# Patient Record
Sex: Male | Born: 1950 | Race: White | Hispanic: No | Smoking: Never smoker
Health system: Southern US, Community
[De-identification: ages and names within clinical notes are randomized; demographics above are authoritative.]

## PROBLEM LIST (undated history)

## (undated) DIAGNOSIS — I639 Cerebral infarction, unspecified: Secondary | ICD-10-CM

## (undated) DIAGNOSIS — I10 Essential (primary) hypertension: Secondary | ICD-10-CM

## (undated) HISTORY — DX: Cerebral infarction, unspecified: I63.9

## (undated) HISTORY — PX: COLONOSCOPY: SHX174

---

## 2011-10-30 ENCOUNTER — Emergency Department (HOSPITAL_COMMUNITY): Payer: BC Managed Care – PPO

## 2011-10-30 ENCOUNTER — Inpatient Hospital Stay (HOSPITAL_COMMUNITY)
Admission: EM | Admit: 2011-10-30 | Discharge: 2011-11-14 | DRG: 533 | Disposition: A | Discharge status: Rehabilitation Facility | Payer: BC Managed Care – PPO | Attending: Neurology | Admitting: Neurology

## 2011-10-30 ENCOUNTER — Encounter (HOSPITAL_COMMUNITY): Payer: Self-pay | Admitting: *Deleted

## 2011-10-30 DIAGNOSIS — M6282 Rhabdomyolysis: Secondary | ICD-10-CM | POA: Diagnosis present

## 2011-10-30 DIAGNOSIS — I610 Nontraumatic intracerebral hemorrhage in hemisphere, subcortical: Secondary | ICD-10-CM

## 2011-10-30 DIAGNOSIS — I619 Nontraumatic intracerebral hemorrhage, unspecified: Principal | ICD-10-CM | POA: Diagnosis present

## 2011-10-30 DIAGNOSIS — D72829 Elevated white blood cell count, unspecified: Secondary | ICD-10-CM | POA: Diagnosis not present

## 2011-10-30 DIAGNOSIS — R5381 Other malaise: Secondary | ICD-10-CM | POA: Diagnosis present

## 2011-10-30 DIAGNOSIS — I10 Essential (primary) hypertension: Secondary | ICD-10-CM | POA: Diagnosis present

## 2011-10-30 DIAGNOSIS — G8190 Hemiplegia, unspecified affecting unspecified side: Secondary | ICD-10-CM | POA: Diagnosis present

## 2011-10-30 DIAGNOSIS — E876 Hypokalemia: Secondary | ICD-10-CM | POA: Diagnosis not present

## 2011-10-30 DIAGNOSIS — G936 Cerebral edema: Secondary | ICD-10-CM | POA: Diagnosis present

## 2011-10-30 DIAGNOSIS — I498 Other specified cardiac arrhythmias: Secondary | ICD-10-CM | POA: Diagnosis not present

## 2011-10-30 HISTORY — DX: Essential (primary) hypertension: I10

## 2011-10-30 LAB — CBC
HCT: 43.4 % (ref 39.0–52.0)
Hemoglobin: 15.8 g/dL (ref 13.0–17.0)
MCH: 31.5 pg (ref 26.0–34.0)
MCHC: 36.4 g/dL — ABNORMAL HIGH (ref 30.0–36.0)
MCV: 86.6 fL (ref 78.0–100.0)
Platelets: 200 10*3/uL (ref 150–400)
RBC: 5.01 MIL/uL (ref 4.22–5.81)
RDW: 13.2 % (ref 11.5–15.5)
WBC: 14.6 K/uL — ABNORMAL HIGH (ref 4.0–10.5)

## 2011-10-30 LAB — COMPREHENSIVE METABOLIC PANEL
AST: 46 U/L — ABNORMAL HIGH (ref 0–37)
Albumin: 4.6 g/dL (ref 3.5–5.2)
Calcium: 10.4 mg/dL (ref 8.4–10.5)
Creatinine, Ser: 0.96 mg/dL (ref 0.50–1.35)
GFR calc non Af Amer: 89 mL/min — ABNORMAL LOW (ref >90)
Total Protein: 8 g/dL (ref 6.0–8.3)

## 2011-10-30 LAB — COMPREHENSIVE METABOLIC PANEL WITH GFR
ALT: 28 U/L (ref 0–53)
Alkaline Phosphatase: 55 U/L (ref 39–117)
BUN: 25 mg/dL — ABNORMAL HIGH (ref 6–23)
CO2: 26 meq/L (ref 19–32)
Chloride: 104 meq/L (ref 96–112)
GFR calc Af Amer: >90 mL/min (ref >90)
Glucose, Bld: 136 mg/dL — ABNORMAL HIGH (ref 70–99)
Potassium: 3.7 meq/L (ref 3.5–5.1)
Sodium: 143 meq/L (ref 135–145)
Total Bilirubin: 1.5 mg/dL — ABNORMAL HIGH (ref 0.3–1.2)

## 2011-10-30 LAB — DIFFERENTIAL
Basophils Absolute: 0 10*3/uL (ref 0.0–0.1)
Basophils Relative: 0 % (ref 0–1)
Eosinophils Absolute: 0 K/uL (ref 0.0–0.7)
Eosinophils Relative: 0 % (ref 0–5)
Lymphocytes Relative: 10 % — ABNORMAL LOW (ref 12–46)
Lymphs Abs: 1.5 K/uL (ref 0.7–4.0)
Monocytes Absolute: 1.5 10*3/uL — ABNORMAL HIGH (ref 0.1–1.0)
Monocytes Relative: 10 % (ref 3–12)
Neutro Abs: 11.6 K/uL — ABNORMAL HIGH (ref 1.7–7.7)
Neutrophils Relative %: 80 % — ABNORMAL HIGH (ref 43–77)

## 2011-10-30 LAB — PROTIME-INR
INR: 1.02 (ref 0.00–1.49)
Prothrombin Time: 13.6 seconds (ref 11.6–15.2)

## 2011-10-30 LAB — TROPONIN I: Troponin I: <0.3 ng/mL (ref <0.30)

## 2011-10-30 LAB — CK: Total CK: 1299 U/L — ABNORMAL HIGH (ref 7–232)

## 2011-10-30 LAB — APTT: aPTT: 26 seconds (ref 24–37)

## 2011-10-30 MED ORDER — NICARDIPINE HCL IN NACL 20-0.86 MG/200ML-% IV SOLN
5.0000 mg/h | INTRAVENOUS | Status: DC
  Administered 2011-10-31: 5 mg/h via INTRAVENOUS
  Filled 2011-10-30 (×2): qty 200

## 2011-10-30 MED ORDER — SODIUM CHLORIDE 0.9 % IV SOLN
Freq: Once | INTRAVENOUS | Status: AC
  Administered 2011-10-31: via INTRAVENOUS

## 2011-10-30 NOTE — ED Provider Notes (Signed)
History     CSN: [id number]  Arrival date & time 10/30/11  2228   First MD Initiated Contact with Patient 10/30/11 2234      Chief Complaint  Patient presents with  . Cerebrovascular Accident    (Consider location/radiation/quality/duration/timing/severity/associated sxs/prior treatment) Patient is a 61 y.o. male presenting with neurologic complaint. The history is provided by the patient.  Neurologic Problem The primary symptoms include focal weakness and loss of sensation. Primary symptoms do not include loss of consciousness, fever, nausea or vomiting. The symptoms began 2 days ago. The symptoms are worsening. The neurological symptoms are focal.  Weakness began greater than 24 hours (2 days) ago. The weakness is worsening. There is decreased muscle function with maximum physical effort.  There is weakness in these regions/motions: left shoulder extension, left bicep flexion, left tricep extension, left forearm flexion, left forearm extension, left finger extension, left thumb abduction, left hip flexion, left thigh extension, left thigh flexion, left plantarflexion and left dorsiflexion. There is impairment of the following actions: shrugging shoulders, reaching upwards, holding things, buttoning, opening a bottle, standing up and walking.  Loss of sensation began greater than 24 hours (2 days) ago. The loss of sensation is unchanged. The deficit is described as loss of sensation to touch.  Additional symptoms include weakness (LUE and LLE). Medical issues also include hypertension.    Past Medical History  Diagnosis Date  . Hypertension     History reviewed. No pertinent past surgical history.  History reviewed. No pertinent family history.  History  Substance Use Topics  . Smoking status: Never Smoker   . Smokeless tobacco: Never Used  . Alcohol Use: Yes      Review of Systems  Constitutional: Negative for fever and chills.  HENT: Negative.   Eyes: Positive for  visual disturbance.  Respiratory: Negative.  Negative for shortness of breath.   Cardiovascular: Negative.  Negative for chest pain and palpitations.  Gastrointestinal: Negative for nausea, vomiting, diarrhea and constipation.  Genitourinary: Negative.   Musculoskeletal: Negative.   Neurological: Positive for focal weakness, weakness (LUE and LLE) and numbness (LUE and LLE). Negative for loss of consciousness.  All other systems reviewed and are negative.    Allergies  Review of patient's allergies indicates no known allergies.  Home Medications   Current Outpatient Rx  Name Route Sig Dispense Refill  . OCUVITE PO TABS Oral Take 1 tablet by mouth daily.      BP 233/103  Pulse 68  Temp 98.8 F (37.1 C) (Oral)  Resp 12  SpO2 97%  Physical Exam  Nursing note and vitals reviewed. Constitutional: He is oriented to person, place, and time. He appears well-developed and well-nourished.  HENT:  Head: Normocephalic and atraumatic.  Eyes: Conjunctivae are normal. Pupils are equal, round, and reactive to light. Right eye exhibits abnormal extraocular motion. Left eye exhibits abnormal extraocular motion.  Neck: Neck supple.  Cardiovascular: Normal rate, regular rhythm, normal heart sounds and intact distal pulses.   Pulmonary/Chest: Effort normal and breath sounds normal. He has no wheezes. He has no rales.  Abdominal: Soft. He exhibits no distension. There is no tenderness.  Musculoskeletal: Normal range of motion.  Neurological: He is alert and oriented to person, place, and time. A cranial nerve deficit and sensory deficit (LUE and LLE) is present. He exhibits abnormal muscle tone. GCS eye subscore is 4. GCS verbal subscore is 5. GCS motor subscore is 6. He displays no Babinski's sign on the right side. He displays Babinski's  sign on the left side.       LUE: Strength 2/5 bicep flexion, triceps extension, forearm flexion/extionsion; 3/5 grip LLE: 3/5 hip flexion/extension, knee  flexion/extension, plantar and dorsiflexion  Skin: Skin is warm and dry. Abrasion (LUE and LLE) noted.    ED Course  Procedures (including critical care time)  Labs Reviewed  CBC - Abnormal; Notable for the following:    WBC 14.6 (*)     MCHC 36.4 (*)     All other components within normal limits  DIFFERENTIAL - Abnormal; Notable for the following:    Neutrophils Relative 80 (*)     Neutro Abs 11.6 (*)     Lymphocytes Relative 10 (*)     Monocytes Absolute 1.5 (*)     All other components within normal limits  COMPREHENSIVE METABOLIC PANEL - Abnormal; Notable for the following:    Glucose, Bld 136 (*)     BUN 25 (*)     AST 46 (*)     Total Bilirubin 1.5 (*)     GFR calc non Af Amer 89 (*)     All other components within normal limits  CK - Abnormal; Notable for the following:    Total CK 1299 (*)     All other components within normal limits  PROTIME-INR  APTT  TROPONIN I  URINE RAPID DRUG SCREEN (HOSP PERFORMED)   Ct Head Wo Contrast  10/30/2011  *RADIOLOGY REPORT*  Clinical Data: Larey Seat on floor.  Uneven pupils.  CT HEAD WITHOUT CONTRAST  Technique:  Contiguous axial images were obtained from the base of the skull through the vertex without contrast.  Comparison: None.  Findings: There is an acute hemorrhage in the right thalamus measuring 2.5 x 4.1 cm.  There is surrounding edema.  The hemorrhage extends into the right lateral ventricle with intraventricular hemorrhage present.  There is some mass effect with sulci effacement.  No significant midline shift.  No effacement of basal cisterns.  No additional hemorrhage is identified.  No abnormal extra-axial fluid collections.  Ventricles are not dilated.  No depressed skull fractures.  Visualized paranasal sinuses and mastoid air cells are not opacified.  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  Surrounding edema and mild mass effect without midline shift.   Critical Value/emergent results were called by telephone at the time of interpretation on 10/30/2011 at 2322 hours to Dr. Christain Sacramento, who verbally acknowledged these results.   Original Report Authenticated By: Marlon Pel, M.D.      1. Intracerebral hemorrhage   2. Hypertension, malignant   3. Hemiplegia, unspecified, affecting nondominant side       MDM  61 yo male with PMHx of HTN who presents for possible CVA.  Pt last seen normal 3 days ago.  He reports sx started 2 days ago with numbness in the right upper and lower extremity that progressed to weakness of the right upper and lower extremity.  Pt was found by a coworker tonight at 2130.  AF, hypertensive with vital signs otherwise stable.  Physical exam as documented above remarkable for significant weakness in the left upper and lower extremities.  Pt alert and oriented, but will at times be slow in answering questions and require repeating of questions.  Presentation concerning for likely CVA and hypertensive emergency.  CT head with acute intraparenchymal hemorrhage in the right thalamus extending to the right lateral ventricle.  Surrounding edema and mild mass effect without midline shift.  Will start nicardipine gtt for blood pressure control.  Neurology consulted and will admit pt for further management.         Cherre Robins, MD 10/31/11 [phone]

## 2011-10-30 NOTE — ED Notes (Signed)
Patient arrived via EMS.  EMS stated the patient was found on the floor tonight by a coworker at approx 930pm but last seen normal Friday afternoon.  Patient will take when spoken to but sometimes he stares off into space.  Pupils unequal.

## 2011-10-30 NOTE — ED Provider Notes (Signed)
  I saw and evaluated the patient, reviewed the resident's note and I agree with the findings and plan.    Pt last seen on Friday.  Pt with hemiparesis, likely stroke that occurred days ago.  Pt with h/o HTN and family h/o CVA.  Pt likely has been on floor for a long time.  Will need head CT, admission, IVF's for presumed rhabdo.   CRITICAL CARE Performed by: Lear Ng   Total critical care time: 30 min  Critical care time was exclusive of separately billable procedures and treating other patients.  Critical care was necessary to treat or prevent imminent or life-threatening deterioration.  Critical care was time spent personally by me on the following activities: development of treatment plan with patient and/or surrogate as well as nursing, discussions with consultants, evaluation of patient's response to treatment, examination of patient, obtaining history from patient or surrogate, ordering and performing treatments and interventions, ordering and review of laboratory studies, ordering and review of radiographic studies, pulse oximetry and re-evaluation of patient's condition.    11:36 PM Head CT shows intraparenchymal hemorrhage.  Will need nicardipine drip to lower acute HTN emergency with hemorrhage.  Dr. Thad Ranger to see and admit.  Protecting airway currently.    Gavin Pound. Oletta Lamas, MD 10/30/11 2337

## 2011-10-31 ENCOUNTER — Inpatient Hospital Stay (HOSPITAL_COMMUNITY): Payer: BC Managed Care – PPO

## 2011-10-31 ENCOUNTER — Encounter (HOSPITAL_COMMUNITY): Payer: Self-pay | Admitting: Neurology

## 2011-10-31 DIAGNOSIS — I10 Essential (primary) hypertension: Secondary | ICD-10-CM | POA: Diagnosis present

## 2011-10-31 DIAGNOSIS — I619 Nontraumatic intracerebral hemorrhage, unspecified: Principal | ICD-10-CM | POA: Diagnosis present

## 2011-10-31 DIAGNOSIS — G8190 Hemiplegia, unspecified affecting unspecified side: Secondary | ICD-10-CM | POA: Diagnosis present

## 2011-10-31 LAB — RAPID URINE DRUG SCREEN, HOSP PERFORMED
Amphetamines: NOT DETECTED
Barbiturates: NOT DETECTED
Benzodiazepines: NOT DETECTED
Cocaine: NOT DETECTED
Opiates: NOT DETECTED
Tetrahydrocannabinol: NOT DETECTED

## 2011-10-31 LAB — MRSA PCR SCREENING: MRSA by PCR: NEGATIVE

## 2011-10-31 MED ORDER — PNEUMOCOCCAL VAC POLYVALENT 25 MCG/0.5ML IJ INJ
0.5000 mL | INJECTION | INTRAMUSCULAR | Status: AC
  Administered 2011-11-01: 0.5 mL via INTRAMUSCULAR
  Filled 2011-10-31: qty 0.5

## 2011-10-31 MED ORDER — LORAZEPAM 2 MG/ML IJ SOLN
1.0000 mg | Freq: Once | INTRAMUSCULAR | Status: AC
  Administered 2011-10-31: 1 mg via INTRAVENOUS

## 2011-10-31 MED ORDER — NICARDIPINE HCL IN NACL 40-0.83 MG/200ML-% IV SOLN
3.0000 mg/h | INTRAVENOUS | Status: DC
  Administered 2011-10-31 – 2011-11-01 (×3): 5 mg/h via INTRAVENOUS
  Administered 2011-11-01 – 2011-11-02 (×3): 10 mg/h via INTRAVENOUS
  Administered 2011-11-02: 8 mg/h via INTRAVENOUS
  Administered 2011-11-02 (×2): 10 mg/h via INTRAVENOUS
  Filled 2011-10-31 (×12): qty 200

## 2011-10-31 MED ORDER — ACETAMINOPHEN 650 MG RE SUPP: 650.0000 mg | RECTAL | Status: DC | PRN

## 2011-10-31 MED ORDER — SENNOSIDES-DOCUSATE SODIUM 8.6-50 MG PO TABS
1.0000 | ORAL_TABLET | Freq: Two times a day (BID) | ORAL | Status: DC
  Administered 2011-10-31 – 2011-11-14 (×26): 1 via ORAL
  Filled 2011-10-31 (×29): qty 1

## 2011-10-31 MED ORDER — AMLODIPINE BESYLATE 5 MG PO TABS
5.0000 mg | ORAL_TABLET | Freq: Every day | ORAL | Status: DC
  Administered 2011-10-31: 5 mg via ORAL
  Filled 2011-10-31 (×2): qty 1

## 2011-10-31 MED ORDER — HALOPERIDOL LACTATE 5 MG/ML IJ SOLN: 2.0000 mg | Freq: Once | INTRAMUSCULAR | Status: DC

## 2011-10-31 MED ORDER — PANTOPRAZOLE SODIUM 40 MG IV SOLR
40.0000 mg | Freq: Every day | INTRAVENOUS | Status: DC
  Filled 2011-10-31: qty 40

## 2011-10-31 MED ORDER — LABETALOL HCL 5 MG/ML IV SOLN
5.0000 mg | INTRAVENOUS | Status: DC | PRN
  Administered 2011-10-31 (×2): 10 mg via INTRAVENOUS
  Administered 2011-11-01 (×2): 20 mg via INTRAVENOUS
  Administered 2011-11-01: 15 mg via INTRAVENOUS
  Filled 2011-10-31 (×3): qty 4
  Filled 2011-10-31: qty 8
  Filled 2011-10-31: qty 4

## 2011-10-31 MED ORDER — HALOPERIDOL LACTATE 5 MG/ML IJ SOLN
INTRAMUSCULAR | Status: AC
  Administered 2011-10-31: 2 mg
  Filled 2011-10-31: qty 2

## 2011-10-31 MED ORDER — SODIUM CHLORIDE 0.9 % IV SOLN
INTRAVENOUS | Status: DC
  Administered 2011-10-31: 75 mL/h via INTRAVENOUS
  Administered 2011-11-01 – 2011-11-02 (×2): via INTRAVENOUS

## 2011-10-31 MED ORDER — ONDANSETRON HCL 4 MG/2ML IJ SOLN: 4.0000 mg | Freq: Four times a day (QID) | INTRAMUSCULAR | Status: DC | PRN

## 2011-10-31 MED ORDER — NICARDIPINE HCL IN NACL 20-0.86 MG/200ML-% IV SOLN
3.0000 mg/h | INTRAVENOUS | Status: DC
  Administered 2011-10-31 (×3): 15 mg/h via INTRAVENOUS
  Filled 2011-10-31 (×4): qty 200

## 2011-10-31 MED ORDER — ACETAMINOPHEN 325 MG PO TABS
650.0000 mg | ORAL_TABLET | ORAL | Status: DC | PRN
  Administered 2011-11-05 – 2011-11-14 (×7): 650 mg via ORAL
  Filled 2011-10-31 (×7): qty 2

## 2011-10-31 MED ORDER — PANTOPRAZOLE SODIUM 20 MG PO TBEC
20.0000 mg | DELAYED_RELEASE_TABLET | Freq: Every day | ORAL | Status: DC
  Administered 2011-10-31 – 2011-11-14 (×11): 20 mg via ORAL
  Filled 2011-10-31 (×15): qty 1

## 2011-10-31 NOTE — ED Notes (Signed)
Foley inserted and patient was very agitated and pulled the foley, foley removed.  Urinal given and patient stated he would try

## 2011-10-31 NOTE — ED Notes (Signed)
Patient arrived via EMS.  Left arm and leg noted to be red and mottled.  Abrasions on top of left foot and toes.  Patient cannot move his left arm or leg.  Stated he woke up Sat morning with his left side numb.  Larey Seat out of bed.

## 2011-10-31 NOTE — Evaluation (Signed)
Physical Therapy Evaluation Patient Details Name: Anthony Hardin MRN: [id number] DOB: [birth year] Today's Date: 10/31/2011 Time: 7829-5621 PT Time Calculation (min): 27 min  PT Assessment / Plan / Recommendation Clinical Impression  pt admitted with L sided HP, some inattension to the L and diplopia with decr smooth pursuits.  Pt will benefit from PT to address these issues and assist improving functional mobility.  Rec CIR.    PT Assessment  Patient needs continued PT services    Follow Up Recommendations  Inpatient Rehab    Barriers to Discharge Decreased caregiver support      Equipment Recommendations  Defer to next venue    Recommendations for Other Services Rehab consult   Frequency Min 4X/week    Precautions / Restrictions Precautions Precautions: Fall Restrictions Weight Bearing Restrictions: No   Pertinent Vitals/Pain       Mobility  Bed Mobility Bed Mobility: Rolling Right;Right Sidelying to Sit;Sit to Supine Rolling Right: Other (comment);3: Mod assist (and full assist of L Le) Right Sidelying to Sit: 3: Mod assist;HOB elevated Sit to Supine: 3: Mod assist;HOB flat Details for Bed Mobility Assistance: vc's for initiation, making pt aware of L side, technique; truncal assist Transfers Transfers: Sit to Stand;Stand to Sit Sit to Stand: 3: Mod assist;From bed;Without upper extremity assist Stand to Sit: 4: Min assist;To bed Details for Transfer Assistance: steady assist; note some attempt to WB L LE automatically Ambulation/Gait Ambulation/Gait Assistance: Not tested (comment) Stairs: No Wheelchair Mobility Wheelchair Mobility: No Modified Rankin (Stroke Patients Only) Pre-Morbid Rankin Score: No symptoms Modified Rankin: Severe disability    Exercises     PT Diagnosis: Hemiplegia dominant side  PT Problem List: Decreased strength;Decreased activity tolerance;Decreased balance;Decreased mobility;Decreased coordination;Decreased knowledge of  precautions;Impaired sensation PT Treatment Interventions:     PT Goals Acute Rehab PT Goals PT Goal Formulation: With patient Time For Goal Achievement: 11/14/11 Potential to Achieve Goals: Good Pt will Roll Supine to Right Side: with min assist PT Goal: Rolling Supine to Right Side - Progress: Goal set today Pt will Roll Supine to Left Side: with supervision PT Goal: Rolling Supine to Left Side - Progress: Goal set today Pt will go Supine/Side to Sit: with supervision PT Goal: Supine/Side to Sit - Progress: Goal set today Pt will go Sit to Stand: with min assist PT Goal: Sit to Stand - Progress: Goal set today Pt will Transfer Bed to Chair/Chair to Bed: with min assist PT Transfer Goal: Bed to Chair/Chair to Bed - Progress: Goal set today Pt will Ambulate: 1 - 15 feet;with min assist;with least restrictive assistive device PT Goal: Ambulate - Progress: Goal set today  Visit Information  Last PT Received On: 10/31/11 Assistance Needed: +2 PT/OT Co-Evaluation/Treatment: Yes    Subjective Data  Subjective: I don't think I will be able to do anything today Patient Stated Goal: pt agreed Independence and back to work   Prior [employer] Living Lives With: Alone Available Help at Discharge: Other (Comment) Type of Home: House Home Access: Stairs to enter [employer] of Steps: 2 Entrance Stairs-Rails: Right Home Layout: Two level;Able to live on main level with bedroom/bathroom;1/2 bath on main level Bathroom Shower/Tub: Tub/shower unit;Curtain Bathroom Toilet: Standard Home Adaptive Equipment: Hand-held shower hose Prior Function Level of Independence: Independent Able to Take Stairs?: Yes Driving: Yes Vocation: Full time employment Communication Communication: No difficulties Dominant Hand: Left    Cognition  Overall Cognitive Status: Appears within functional limits for tasks assessed/performed Arousal/Alertness: Lethargic  Extremity/Trunk  Assessment Right Lower Extremity Assessment RLE ROM/Strength/Tone: Within functional levels RLE Sensation: WFL - Light Touch RLE Coordination: WFL - gross/fine motor Left Lower Extremity Assessment LLE ROM/Strength/Tone: Deficits LLE ROM/Strength/Tone Deficits: noted only associated reactions, decr sensation.   Balance Static Sitting Balance Static Sitting - Balance Support: Right upper extremity supported;Feet unsupported Static Sitting - Level of Assistance: 5: Stand by assistance Static Sitting - Comment/# of Minutes: pt started listing posteriorly when challenges given, but held easily in upright posture statically  End of Session PT - End of Session Activity Tolerance: Patient tolerated treatment well;Patient limited by fatigue Patient left: in bed;with call bell/phone within reach;with family/visitor present Nurse Communication: Mobility status  GP     Milderd Manocchio, Eliseo Gum 10/31/2011, 11:28 AM  10/31/2011  [place]omfret Bing, PT [phone] (pager)

## 2011-10-31 NOTE — Evaluation (Signed)
Speech Language Pathology Evaluation Patient Details Name: Anthony Hardin MRN: [id number] DOB: 1950/12/30 Today's Date: 10/31/2011 Time: 2952-8413 SLP Time Calculation (min): 10 min  Problem List:  Patient Active Problem List  Diagnosis  . Intracerebral hemorrhage  . Hypertension, malignant  . Hemiplegia, unspecified, affecting nondominant side   Past Medical History:  Past Medical History  Diagnosis Date  . Hypertension    Past Surgical History: History reviewed. No pertinent past surgical history. HPI:  61 y.o. male presenting with left sided weakness/numbness and LHH secondary to right thalamic hemorrhage.  Hemorrhage has intraventricular extension.  No significant midline shift noted.  Patient is markedly hypertensive.  Hypertension likely cause of hemorrhage.  Patient reports no past history of hypertension.     Assessment / Plan / Recommendation Clinical Impression  Patient presents with cognitive-linguistic deficits s/p acute CVA in the areas of attention, awareness, problem solving, reasoning, judgement, left sided neglect, and possible mild dysarthria (due to mild oral weakness vs lethargy). Patient will benefit from skilled SLP treatment to address above areas as well as CIR after d/c.     SLP Assessment  Patient needs continued Speech Lanaguage Pathology Services    Follow Up Recommendations  Inpatient Rehab    Frequency and Duration min 3x week  2 weeks   Pertinent Vitals/Pain n/a   SLP Goals  SLP Goals Potential to Achieve Goals: Good Progress/Goals/Alternative treatment plan discussed with pt/caregiver and they: Agree SLP Goal #1: Patient will sustain attention to basic functional ADL with min verbal cues.  SLP Goal #1 - Progress: Not met SLP Goal #2: Patient will attend to left visual field during basic functional ADLs with min visual cues SLP Goal #2 - Progress: Not met SLP Goal #3: Patient will initiate carryout of basic functional and familiar ADL  with no more than 2 verbal or visual cues.  SLP Goal #3 - Progress: Not met SLP Goal #4: Patient will demonstrate basic emergent awareness of problems as related to acute deficits during functional ADLs with mod verbal cues. 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 "its a struggle to stay awake") Orientation Level: Oriented X4 Attention: Focused;Sustained Focused Attention: Appears intact Sustained Attention: Impaired Sustained Attention Impairment: Verbal basic;Functional basic Memory: Appears intact (long term and basic short term appears intact) Awareness: Impaired Awareness Impairment: Emergent impairment;Intellectual impairment (albe to verbalize deficits but not how they impact function) Problem Solving: Impaired Problem Solving Impairment: Functional basic Executive Function: Initiating;Self Monitoring;Self Correcting Initiating: Impaired Initiating Impairment: Functional basic Self Monitoring: Impaired Self Monitoring Impairment: Functional basic Self Correcting: Impaired Self Correcting Impairment: Functional basic Behaviors: Impulsive Safety/Judgment: Impaired Comments: decreased safety awareness    Comprehension  Auditory Comprehension Overall Auditory Comprehension: Impaired Yes/No Questions: Within Functional Limits Commands: Impaired One Step Basic Commands: 25-49% accurate (requires repetition and visual cueing) Conversation: Simple (WFL) Interfering Components: Attention (left sided neglect) EffectiveTechniques: Visual/Gestural cues;Repetition Visual Recognition/Discrimination Discrimination: Within Function Limits Reading  Comprehension Reading Status: Not tested    Expression Expression Primary Mode of Expression: Verbal Verbal Expression Overall Verbal Expression: Appears within functional limits for tasks assessed Written Expression Dominant Hand: Left (Simultaneous filing. User may not have seen previous data.) Written Expression: Not tested   Oral / Motor Oral  Motor/Sensory Function Overall Oral Motor/Sensory Function: Impaired Labial ROM: Within Functional Limits Labial Symmetry: Within Functional Limits Labial Strength: Within Functional Limits Labial Sensation: Other (Comment) (? mild vs left sided neglect) Lingual ROM: Within Functional Limits Lingual Symmetry: Abnormal symmetry left Lingual Strength: Within Functional Limits Lingual Sensation: Within Functional Limits Facial ROM: Within Functional Limits Facial Symmetry: Within Functional Limits Facial Strength: Within Functional Limits Facial Sensation:  (? mild vs left sided neglect) Velum: Within Functional Limits Mandible: Within Functional Limits Motor Speech Overall Motor Speech: Impaired Respiration: Within functional limits Phonation: Normal Resonance: Within functional limits Articulation: Impaired Level of Impairment: Word Intelligibility: Intelligibility reduced Word: 75-100% accurate (very mild slurred speech; ? oral weakness vs lethargy) Motor Planning: Witnin functional limits Motor Speech Errors: Not applicable Interfering Components:  (lethargy) Effective Techniques: Over-articulate   GO   Anthony Manthe MA, CCC-SLP [id number]   Anthony Hardin Anthony Hardin 10/31/2011, 10:09 AM

## 2011-10-31 NOTE — Progress Notes (Addendum)
Stroke Team Progress Note  HISTORY Anthony Hardin is an 61 y.o. male who was last seen normal on 8/16 after work. Patient reports that he began to feel numbness and weakness on his left side on Saturday. Reports that he was unable to move and in attempting to move fell out of the bed and onto the floor. Was unable to get off the floor and was found by co-workers today. EMS was called and patient was brought in for evaluation   SUBJECTIVE His nurse is at the bedside. No family locally. Overall he feels his condition is unchanged. He feels weak. He was weak on Saturday and that is all her remembers. He has no MD that he follows regularly. He had elevated BP, but not treated. Patient reminded to seek regular medical care for this.  OBJECTIVE Most recent Vital Signs: Filed Vitals:   10/31/11 0715 10/31/11 0730 10/31/11 0745 10/31/11 0800  BP: 157/55 151/68 131/68 153/73  Pulse: 88 89 84 88  Temp:      TempSrc:      Resp: 21 16 17 13   Height: 5\' 9"  (1.753 m)     Weight: 76.1 kg (167 lb 12.3 oz)     SpO2: 97% 97% 96% 97%   CBG (last 3)   Basename 10/31/11 0052  GLUCAP 146*   Intake/Output from previous day: 08/19 0701 - 08/20 0700 In: 1104.6 [I.V.:1104.6] Out: -   IV Fluid Intake:     . sodium chloride 75 mL/hr (10/31/11 0157)  . niCARDipine 5 mg/hr (10/31/11 0800)  . DISCONTD: niCARDipine 5 mg/hr (10/31/11 0016)  . DISCONTD: niCARDipine 10 mg/hr (10/31/11 0517)    MEDICATIONS    . sodium chloride   Intravenous Once  . pantoprazole (PROTONIX) IV  40 mg Intravenous QHS  . pneumococcal 23 valent vaccine  0.5 mL Intramuscular Tomorrow-1000  . senna-docusate  1 tablet Oral BID   PRN:  acetaminophen, acetaminophen, ondansetron (ZOFRAN) IV  Diet:  DYS 3  Activity:  Bedrest DVT Prophylaxis:  SCD  CLINICALLY SIGNIFICANT STUDIES Basic Metabolic Panel:  Lab 10/30/11 5784  NA 143  K 3.7  CL 104  CO2 26  GLUCOSE 136*  BUN 25*  CREATININE 0.96  CALCIUM 10.4  MG --  PHOS  --   Liver Function Tests:  Lab 10/30/11 2255  AST 46*  ALT 28  ALKPHOS 55  BILITOT 1.5*  PROT 8.0  ALBUMIN 4.6   CBC:  Lab 10/30/11 2255  WBC 14.6*  NEUTROABS 11.6*  HGB 15.8  HCT 43.4  MCV 86.6  PLT 200   Coagulation:  Lab 10/30/11 2255  LABPROT 13.6  INR 1.02   Cardiac Enzymes:  Lab 10/30/11 2301 10/30/11 2255  CKTOTAL -- 1299*  CKMB -- --  CKMBINDEX -- --  TROPONINI <0.30 --    Urine Drug Screen:  pending   Ct Head Wo Contrast  10/30/2011  *RADIOLOGY REPORT*  Clinical Data: Larey Seat on floor.  Uneven pupils.  CT HEAD WITHOUT CONTRAST  Technique:  Contiguous axial images were obtained from the base of the skull through the vertex without contrast.  Comparison: None.  Findings: There is an acute hemorrhage in the right thalamus measuring 2.5 x 4.1 cm.  There is surrounding edema.  The hemorrhage extends into the right lateral ventricle with intraventricular hemorrhage present.  There is some mass effect with sulci effacement.  No significant midline shift.  No effacement of basal cisterns.  No additional hemorrhage is identified.  No abnormal extra-axial fluid  collections.  Ventricles are not dilated.  No depressed skull fractures.  Visualized paranasal sinuses and mastoid air cells are not opacified.  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  Surrounding edema and mild mass effect without midline shift.  Critical Value/emergent results were called by telephone at the time of interpretation on 10/30/2011 at 2322 hours to Dr. Christain Sacramento, who verbally acknowledged these results.   Original Report Authenticated By: Marlon Pel, M.D.    Dg Chest Port 1 View  10/31/2011  *RADIOLOGY REPORT*  Clinical Data: Unresponsive.  Possible aspiration  PORTABLE CHEST - 1 VIEW  Comparison: None.  Findings: Lungs are clear without infiltrate or effusion.  Negative for heart failure.  Cardiac silhouette is mildly enlarged.   IMPRESSION: No acute abnormality.   Original Report Authenticated By: Camelia Phenes, M.D.     CT of the brain ---  MRI of the brain ---  MRA of the brain  ---  EKG  NSR.   Therapy Recommendations PT - ; OT - ; ST -   Physical Exam   Pleasant middle aged Caucasian male not in distress.Awake alert. Afebrile. Head is nontraumatic. Neck is supple without bruit. Hearing is normal. Cardiac exam no murmur or gallop. Lungs are clear to auscultation. Distal pulses are well felt.  Neurological Exam : AO x3 with normal speech and language function. Extraocular movements are full range with mild right gaze preference. Dense left homonymous hemianopsia with decreased blink to threat. Moderate left lower facial weakness. Tongue is midline. Motor system exam reveals dense left hemiplegia with 0/5 strength. Good purposeful antigravity movements on the right side. Left hemianesthesia. Normal coordination and strength on the right. Left plantar is upgoing. Right plantar is downgoing. Gait was not tested. ASSESSMENT [name] is a 61 y.o. 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 Hemorrhage felt to be due to hypertension uncontrolled.  On none prior to admission. Now on none due to bleed for secondary stroke prevention. Patient with resultant weakness, poor recollection of events since Saturday which is believed to be the last day he was known normal after interviewing patient.  -Malignant hypertension  Hospital day # 1  TREATMENT/PLAN -Aggressive blood pressure control. Start oral blood pressure medicines if able to swallow.-Hold off on hypertonic saline result of the edema as it appears to be mild and will follow conservatively for now. Will observe in ICU for one more day and if stable transfer out of the unit in the morning he Needs to have primary MD to follow risk factors and treat hypertension -DVT  prophylaxis. Keep  I equals O. Keep normothermic and euglycemic the -This patient is critically ill and at significant risk of neurological worsening, death and care requires constant monitoring of vital signs, hemodynamics,respiratory and cardiac monitoring,review of multiple databases, neurological assessment, discussion with family, other specialists and medical decision making of high complexity. I spent 30 minutes of neurocritical care time  in the care of  this patient. Guy Franco, [facility],  MBA, MHA [facility] Pager: [phone] 10/31/2011 10:33 AM  Scribe for Dr. Delia Heady, Stroke Center Medical Director. He has personally reviewed chart, pertinent data, examined the patient and developed the plan of care. Delia Heady, MD Medical Director [facility] Pager: [phone] 10/31/2011 10:58 AM

## 2011-10-31 NOTE — Evaluation (Signed)
Clinical/Bedside Swallow Evaluation Patient Details  Name: Anthony Anthony MRN: [id number] Date of Birth: 1950/09/17  Today's Date: 10/31/2011 Time: 4098-1191 SLP Time Calculation (min): 12 min  Past Medical History:  Past Medical History  Diagnosis Date  . Hypertension    Past Surgical History: History reviewed. No pertinent past surgical history. HPI:  61 y.o. male presenting with left sided weakness/numbness and LHH secondary to right thalamic hemorrhage.  Hemorrhage has intraventricular extension.  No significant midline shift noted.  Patient is markedly hypertensive.  Hypertension likely cause of hemorrhage.  Patient reports no past history of hypertension.     Assessment / Plan / Recommendation Clinical Impression  Patient presents with a mild oral dysphagia with a combined cognitive component. Oral phase characterized by slight left sided weakness with functional mastication of solids however cognitive status characterized by left sided neglect resulting in an increase in anterior labial spillage and left sided buccal pocketing which patient able to clear with cues for lingual sweep. Recommend initiation of mechanical soft solids, thin liquids with close full supervision for use of safe swallowing strategies.     Aspiration Risk  Moderate    Diet Recommendation Dysphagia 3 (Mechanical Soft);Thin liquid   Liquid Administration via: Cup;Straw Medication Administration: Whole meds with liquid (one at a time) Supervision: Patient able to self feed;Full supervision/cueing for compensatory strategies Compensations: Slow rate;Small sips/bites;Check for pocketing;Check for anterior loss (left sided) Postural Changes and/or Swallow Maneuvers: Seated upright 90 degrees    Other  Recommendations Oral Care Recommendations: Oral care BID   Follow Up Recommendations  Inpatient Rehab    Frequency and Duration min 2x/week  3 weeks   Pertinent Vitals/Pain n/a    SLP Swallow  Goals Patient will consume recommended diet without observed clinical signs of aspiration with: Minimal assistance Swallow Study Goal #1 - Progress: Not Met Patient will utilize recommended strategies during swallow to increase swallowing safety with: Minimal assistance Swallow Study Goal #2 - Progress: Not met   Swallow Study General HPI: 61 y.o. male presenting with left sided weakness/numbness and LHH secondary to right thalamic hemorrhage.  Hemorrhage has intraventricular extension.  No significant midline shift noted.  Patient is markedly hypertensive.  Hypertension likely cause of hemorrhage.  Patient reports no past history of hypertension.   Type of Study: Bedside swallow evaluation Diet Prior to this Study: NPO Temperature Spikes Noted: No Respiratory Status: Room air History of Recent Intubation: No Behavior/Cognition: Lethargic;Cooperative;Pleasant mood Oral Cavity - Dentition: Adequate natural dentition Self-Feeding Abilities: Able to feed self;Needs assist Patient Positioning: Upright in bed Baseline Vocal Quality: Clear Volitional Cough: Strong Volitional Swallow: Able to elicit    Oral/Motor/Sensory Function Overall Oral Motor/Sensory Function: Impaired Labial ROM: Within Functional Limits Labial Symmetry: Within Functional Limits Labial Strength: Within Functional Limits Labial Sensation:  (? mildly reduced on left vs left sided neglect) Lingual ROM: Within Functional Limits Lingual Symmetry: Abnormal symmetry left Lingual Strength: Within Functional Limits Lingual Sensation: Within Functional Limits Facial ROM: Within Functional Limits Facial Symmetry: Within Functional Limits Facial Strength: Within Functional Limits Facial Sensation:  (? left vs left sided neglect) Velum: Within Functional Limits Mandible: Within Functional Limits   Ice Chips Ice chips: Not tested   Thin Liquid Thin Liquid: Within functional limits Presentation: Cup;Self Fed;Straw    Nectar  Thick Nectar Thick Liquid: Not tested   Honey Thick Honey Thick Liquid: Not tested   Puree Puree: Impaired Presentation: Self Fed;Spoon Oral Phase Impairments: Reduced labial seal Oral Phase Functional Implications: Left anterior  spillage (trace)   Solid   GO    Solid: Impaired Presentation: Self Fed Oral Phase Impairments: Impaired anterior to posterior transit Oral Phase Functional Implications: Left lateral sulci pocketing;Oral residue    [employer] MA, CCC-SLP [phone]      Anthony Anthony Anthony Hardin 10/31/2011,10:00 AM

## 2011-10-31 NOTE — ED Notes (Signed)
Blood sugar 146

## 2011-10-31 NOTE — H&P (Signed)
Admission H&P    Chief Complaint: Left sided weakness HPI: Anthony Hardin is an 61 y.o. male who was last seen normal on 8/16 after work.  Patient reports that he began to feel numbness and weakness on his left side on Saturday.  Reports that he was unable to move and in attempting to move fell out of the bed and onto the floor.  Was unable to get off the floor and was found by co-workers today.  EMS was called and patient was brought in for evaluation.    LSN: 10/27/2011 tPA Given: No: ICH  Past Medical History  Diagnosis Date  . Hypertension     History reviewed. No pertinent past surgical history.  History reviewed. No pertinent family history.  Social History:  reports that he has never smoked. He has never used smokeless tobacco. He reports that he drinks alcohol. He reports that he does not use illicit drugs.  Allergies: No Known Allergies   Medications: Current outpatient prescriptions:beta carotene w/minerals (OCUVITE) tablet, Take 1 tablet by mouth daily., Disp: , Rfl:   ROS: History obtained from the patient  General ROS: negative for - chills, fatigue, fever, night sweats, weight gain or weight loss Psychological ROS: negative for - behavioral disorder, hallucinations, memory difficulties, mood swings or suicidal ideation Ophthalmic ROS: negative for - blurry vision, double vision, eye pain or loss of vision ENT ROS: negative for - epistaxis, nasal discharge, oral lesions, sore throat, tinnitus or vertigo Allergy and Immunology ROS: negative for - hives or itchy/watery eyes Hematological and Lymphatic ROS: bruising on left side of body Endocrine ROS: negative for - galactorrhea, hair pattern changes, polydipsia/polyuria or temperature intolerance Respiratory ROS: negative for - cough, hemoptysis, shortness of breath or wheezing Cardiovascular ROS: negative for - chest pain, dyspnea on exertion, edema or irregular heartbeat Gastrointestinal ROS: negative for -  abdominal pain, diarrhea, hematemesis, nausea/vomiting or stool incontinence Genito-Urinary ROS: negative for - dysuria, hematuria, incontinence or urinary frequency/urgency Musculoskeletal ROS: negative for - joint swelling or muscular weakness Neurological ROS: as noted in HPI Dermatological ROS: negative for rash and skin lesion changes  Physical Examination: Blood pressure 233/103, pulse 68, temperature 98.8 F (37.1 C), temperature source Oral, resp. rate 12, SpO2 97.00%.  General Examination: HEENT-  Normocephalic with scratches on left side of face.  Normal external eye and conjunctiva.  Normal TM's bilaterally.  Normal auditory canals and external ears. Normal external nose, mucus membranes and septum.  Normal pharynx. Neck supple with no masses, nodes, nodules or enlargement. Cardiovascular - S1, S2 normal Lungs - chest clear, no wheezing, rales, normal symmetric air entry Abdomen - soft, non-tender; bowel sounds normal; no masses,  no organomegaly Extremities - no edema, extensive bruising throughout  Neurologic Examination: Mental Status: Lethargic.  Affect flat.  Left neglect.  Speech fluent without evidence of aphasia.  Able to follow 3 step commands without difficulty. Cranial Nerves: II: Discs flat bilaterally; LHH; Pupils equal, round, reactive to light and accommodation III,IV, VI: ptosis not present, extra-ocular motions intact bilaterally with right gaze preference V,VII: Left facial droop; absent left corneal VIII: hearing normal bilaterally IX,X: gag reflex reduced XI: decreased left shoulder shrug XII: tongue strength normal  Motor: Right : Upper extremity   5/5    Left:     Upper extremity   0/5  Lower extremity   5/5     Lower extremity   1-2/5 Tone and bulk:normal tone throughout; no atrophy noted Sensory: Pinprick and light touch decreased on  the left Deep Tendon Reflexes: 2+ in the upper extremities, at the right knee and both ankles.  Left knee jerk  absent Plantars: Right: downgoing   Left: mute Cerebellar: normal finger-to-nose and normal heel-to-shin test on right.  Unable to perform on the left.  Laboratory Studies:   Basic Metabolic Panel:  Lab 10/30/11 9604  NA 143  K 3.7  CL 104  CO2 26  GLUCOSE 136*  BUN 25*  CREATININE 0.96  CALCIUM 10.4  MG --  PHOS --    Liver Function Tests:  Lab 10/30/11 2255  AST 46*  ALT 28  ALKPHOS 55  BILITOT 1.5*  PROT 8.0  ALBUMIN 4.6   No results found for this basename: LIPASE:5,AMYLASE:5 in the last 168 hours No results found for this basename: AMMONIA:3 in the last 168 hours  CBC:  Lab 10/30/11 2255  WBC 14.6*  NEUTROABS 11.6*  HGB 15.8  HCT 43.4  MCV 86.6  PLT 200    Cardiac Enzymes:  Lab 10/30/11 2301 10/30/11 2255  CKTOTAL -- 1299*  CKMB -- --  CKMBINDEX -- --  TROPONINI <0.30 --    BNP: No components found with this basename: POCBNP:5  CBG: No results found for this basename: GLUCAP:5 in the last 168 hours  Microbiology: No results found for this or any previous visit.  Coagulation Studies:  Basename 10/30/11 2255  LABPROT 13.6  INR 1.02    Urinalysis: No results found for this basename: COLORURINE:2,APPERANCEUR:2,LABSPEC:2,PHURINE:2,GLUCOSEU:2,HGBUR:2,BILIRUBINUR:2,KETONESUR:2,PROTEINUR:2,UROBILINOGEN:2,NITRITE:2,LEUKOCYTESUR:2 in the last 168 hours  Lipid Panel:  No results found for this basename: chol, trig, hdl, cholhdl, vldl, ldlcalc    HgbA1C:  No results found for this basename: HGBA1C    Urine Drug Screen:   No results found for this basename: labopia, cocainscrnur, labbenz, amphetmu, thcu, labbarb    Alcohol Level: No results found for this basename: ETH:2 in the last 168 hours  Imaging: Ct Head Wo Contrast  10/30/2011  *RADIOLOGY REPORT*  Clinical Data: Larey Seat on floor.  Uneven pupils.  CT HEAD WITHOUT CONTRAST  Technique:  Contiguous axial images were obtained from the base of the skull through the vertex without contrast.   Comparison: None.  Findings: There is an acute hemorrhage in the right thalamus measuring 2.5 x 4.1 cm.  There is surrounding edema.  The hemorrhage extends into the right lateral ventricle with intraventricular hemorrhage present.  There is some mass effect with sulci effacement.  No significant midline shift.  No effacement of basal cisterns.  No additional hemorrhage is identified.  No abnormal extra-axial fluid collections.  Ventricles are not dilated.  No depressed skull fractures.  Visualized paranasal sinuses and mastoid air cells are not opacified.  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  Surrounding edema and mild mass effect without midline shift.  Critical Value/emergent results were called by telephone at the time of interpretation on 10/30/2011 at 2322 hours to Dr. Christain Sacramento, who verbally acknowledged these results.   Original Report Authenticated By: Marlon Pel, M.D.     Assessment: 61 y.o. male presenting with left sided weakness/numbness and LHH secondary to right thalamic hemorrhage.  Hemorrhage has intraventricular extension.  No significant midline shift noted.  Patient is markedly hypertensive.  Hypertension likely cause of hemorrhage.  Patient reports no past history of hypertension.    Stroke Risk Factors - hypertension  Plan: 1. HgbA1c, fasting lipid panel 2. MRI, MRA  of the brain without contrast 3. PT consult, OT consult, Speech consult  4. Echocardiogram 5. Carotid dopplers 6. Prophylactic therapy-None 7. Cardene for BP control 8. Telemetry monitoring 9. Frequent neuro checks 10. Likely with rhabdo-IV fluids.  Will follow CK 11. CXR - ? aspiration   This patient is critically ill and at significant risk of neurological worsening, death and care requires constant monitoring of vital signs, hemodynamics,respiratory and cardiac monitoring, neurological assessment, discussion with family, other  specialists and medical decision making of high complexity. I spent 60 minutes of neurocritical care time  in the care of  this patient.   Thana Farr, MD [facility] [phone] 10/31/2011, 12:06 AM

## 2011-10-31 NOTE — Evaluation (Signed)
Occupational Therapy Evaluation Patient Details Name: Anthony Hardin MRN: [id number] DOB: [birth year] Today's Date: 10/31/2011 Time: 4782-9562 OT Time Calculation (min): 33 min  OT Assessment / Plan / Recommendation Clinical Impression  61 yo male found down by co worker and last seen normal 10/27/11. Pt with bruising LT side due to laying on floor for prolonged period. Pt with Rt thalamus hemorrhaage on CT scan. OT to follow acutely. Recommend CIR for d/c planning.    OT Assessment  Patient needs continued OT Services    Follow Up Recommendations  Inpatient Rehab    Barriers to Discharge Decreased caregiver support    Equipment Recommendations  Defer to next venue    Recommendations for Other Services Rehab consult  Frequency  Min 2X/week    Precautions / Restrictions Precautions Precautions: Fall Restrictions Weight Bearing Restrictions: No   Pertinent Vitals/Pain None reported    ADL  Transfers/Ambulation Related to ADLs: not appropriate at this time ADL Comments: Pt supine on arrival and demonstrates fatigue. pt with Lt side inattention and sensory deficits. Pt has x3 coworkers present. Coworkers report that pt has a sister and elderly parents     OT Diagnosis: Acute pain;Hemiplegia dominant side  OT Problem List: Decreased strength;Decreased range of motion;Decreased activity tolerance;Impaired balance (sitting and/or standing);Impaired vision/perception;Decreased coordination;Decreased cognition;Decreased safety awareness;Decreased knowledge of use of DME or AE;Decreased knowledge of precautions;Cardiopulmonary status limiting activity;Impaired sensation;Impaired UE functional use;Pain OT Treatment Interventions: Self-care/ADL training;Therapeutic exercise;Neuromuscular education;DME and/or AE instruction;Therapeutic activities;Patient/family education;Balance training   OT Goals Acute Rehab OT Goals OT Goal Formulation: With patient Time For Goal Achievement:  11/14/11 Potential to Achieve Goals: Good ADL Goals Pt Will Perform Grooming: with min assist;Supported;Sitting, edge of bed ADL Goal: Grooming - Progress: Goal set today Miscellaneous OT Goals Miscellaneous OT Goal #1: Pt will perform bed mobility at Min (A) level as precursor to adls OT Goal: Miscellaneous Goal #1 - Progress: Goal set today Miscellaneous OT Goal #2: Pt will located 2 out 4 objects (50%) on Lt side with only verbal request to decrease Lt neglect OT Goal: Miscellaneous Goal #2 - Progress: Goal set today Miscellaneous OT Goal #3: Pt will tolerate eye patch schedule to decrease diplopia as precursor to adls OT Goal: Miscellaneous Goal #3 - Progress: Goal set today  Visit Information  Last OT Received On: 10/31/11 Assistance Needed: +2 PT/OT Co-Evaluation/Treatment: Yes    Subjective Data  Subjective: "work in Com[employer]" Patient Stated Goal: play tennis   Prior Functioning  Vision/Perception  Home Living Lives With: Alone Available Help at Discharge: Other (Comment) Type of Home: House Home Access: Stairs to enter [employer] of Steps: 2 Entrance Stairs-Rails: Right Home Layout: Two level;Able to live on main level with bedroom/bathroom;1/2 bath on main level Bathroom Shower/Tub: Tub/shower unit;Curtain Bathroom Toilet: Standard Home Adaptive Equipment: Hand-held shower hose Prior Function Level of Independence: Independent Able to Take Stairs?: Yes Driving: Yes Vocation: Full time employment Communication Communication: No difficulties Dominant Hand: Left   Vision - Assessment Eye Alignment: Impaired (comment) Vision Assessment: Vision tested Ocular Range of Motion: Impaired-to be further tested in functional context Alignment/Gaze Preference: Gaze right Saccades: Decreased speed of saccadic movement;Impaired - to be further tested in functional context Convergence: Impaired - to be further tested in functional context Additional  Comments: pt Rt eye moving independently of the LT eye  Cognition  Overall Cognitive Status: Appears within functional limits for tasks assessed/performed Arousal/Alertness: Lethargic Orientation Level: Appears intact for tasks assessed Behavior During Session: Lethargic  Extremity/Trunk Assessment Right Upper Extremity Assessment RUE ROM/Strength/Tone: Within functional levels RUE Sensation: WFL - Light Touch RUE Coordination: WFL - gross/fine motor Left Upper Extremity Assessment LUE ROM/Strength/Tone: Deficits LUE ROM/Strength/Tone Deficits: associated reaction, tone, 2- out 5 LUE Sensation: Deficits LUE Sensation Deficits: reports "I can't feel that"- numbness LUE Coordination: Deficits Right Lower Extremity Assessment RLE ROM/Strength/Tone: Within functional levels RLE Sensation: WFL - Light Touch RLE Coordination: WFL - gross/fine motor Left Lower Extremity Assessment LLE ROM/Strength/Tone: Deficits LLE ROM/Strength/Tone Deficits: noted only associated reactions, decr sensation. LLE Sensation: Deficits LLE Coordination: Deficits   Mobility Bed Mobility Bed Mobility: Rolling Right;Right Sidelying to Sit;Sit to Supine Rolling Right: Other (comment);3: Mod assist (and full assist of L Le) Right Sidelying to Sit: 3: Mod assist;HOB elevated Sit to Supine: 3: Mod assist;HOB flat Details for Bed Mobility Assistance: vc's for initiation, making pt aware of L side, technique; truncal assist Transfers Sit to Stand: 3: Mod assist;From bed;Without upper extremity assist Stand to Sit: 4: Min assist;To bed Details for Transfer Assistance: steady assist; note some attempt to WB L LE automatically   Exercise    Balance Static Sitting Balance Static Sitting - Balance Support: Right upper extremity supported;Feet unsupported Static Sitting - Level of Assistance: 5: Stand by assistance  End of Session OT - End of Session Activity Tolerance: Patient limited by fatigue Patient left: in  bed;with call bell/phone within reach Nurse Communication: Precautions  GO  RN Baird Lyons ordering EYE patch and aware of schedule (2 hrs on then alternate eyes)   Lucile Shutters 10/31/2011, 3:00 PM Pager: [phone]

## 2011-10-31 NOTE — Progress Notes (Signed)
OT Cancellation Note  Treatment cancelled today due to:  Bedrest order set. OT to continue to follow acutely and await increased activity orders.   Johnston, Babetta Paterson Brynn   OTR/L Pager: [phone] Office: [phone] .      

## 2011-10-31 NOTE — Progress Notes (Signed)
Nutrition Brief Note  Patient identified on the Nutrition Risk Report for problems chewing and swallowing.  SLP is following pt for new issues related to ICH.  Per friends at bedside pt with good appetite and no recent weight changes.   Body mass index is 24.78 kg/(m^2). BMI is WNL.   Current diet order is Mechanical Soft, Labs and medications reviewed.   No nutrition interventions warranted at this time. If nutrition issues arise, please consult RD.   Kendell Bane RD, LDN, CNSC [phone] Pager [phone] After Hours Pager

## 2011-10-31 NOTE — ED Notes (Signed)
Could not establish a catheter, pt. Pulling it out.

## 2011-11-01 ENCOUNTER — Encounter (HOSPITAL_COMMUNITY): Payer: Self-pay | Admitting: Radiology

## 2011-11-01 ENCOUNTER — Inpatient Hospital Stay (HOSPITAL_COMMUNITY): Payer: BC Managed Care – PPO

## 2011-11-01 DIAGNOSIS — I619 Nontraumatic intracerebral hemorrhage, unspecified: Secondary | ICD-10-CM

## 2011-11-01 LAB — BASIC METABOLIC PANEL
BUN: 16 mg/dL (ref 6–23)
Calcium: 9.2 mg/dL (ref 8.4–10.5)
Creatinine, Ser: 0.82 mg/dL (ref 0.50–1.35)
GFR calc Af Amer: >90 mL/min (ref >90)
GFR calc non Af Amer: >90 mL/min (ref >90)
Glucose, Bld: 132 mg/dL — ABNORMAL HIGH (ref 70–99)

## 2011-11-01 MED ORDER — HYDRALAZINE HCL 20 MG/ML IJ SOLN
25.0000 mg | Freq: Three times a day (TID) | INTRAMUSCULAR | Status: DC | PRN
  Administered 2011-11-01: 25 mg via INTRAVENOUS
  Administered 2011-11-02: 20 mg via INTRAVENOUS
  Administered 2011-11-02: 40 mg via INTRAVENOUS
  Administered 2011-11-03 – 2011-11-06 (×4): 25 mg via INTRAVENOUS
  Filled 2011-11-01 (×4): qty 2
  Filled 2011-11-01: qty 1
  Filled 2011-11-01 (×3): qty 2

## 2011-11-01 MED ORDER — ENOXAPARIN SODIUM 40 MG/0.4ML ~~LOC~~ SOLN
40.0000 mg | SUBCUTANEOUS | Status: DC
  Administered 2011-11-01: 40 mg via SUBCUTANEOUS
  Filled 2011-11-01 (×2): qty 0.4

## 2011-11-01 MED ORDER — AMLODIPINE BESYLATE 10 MG PO TABS
10.0000 mg | ORAL_TABLET | Freq: Every day | ORAL | Status: DC
  Administered 2011-11-01 – 2011-11-14 (×13): 10 mg via ORAL
  Filled 2011-11-01 (×16): qty 1

## 2011-11-01 MED ORDER — HYDROCHLOROTHIAZIDE 25 MG PO TABS
25.0000 mg | ORAL_TABLET | Freq: Every day | ORAL | Status: DC
  Administered 2011-11-02 – 2011-11-14 (×13): 25 mg via ORAL
  Filled 2011-11-01 (×14): qty 1

## 2011-11-01 MED ORDER — LABETALOL HCL 5 MG/ML IV SOLN
10.0000 mg | INTRAVENOUS | Status: DC | PRN
  Administered 2011-11-01 – 2011-11-02 (×3): 40 mg via INTRAVENOUS
  Administered 2011-11-05 – 2011-11-06 (×2): 20 mg via INTRAVENOUS
  Administered 2011-11-06: 7.5 mg via INTRAVENOUS
  Administered 2011-11-06: 20 mg via INTRAVENOUS
  Filled 2011-11-01: qty 8
  Filled 2011-11-01 (×2): qty 4
  Filled 2011-11-01 (×2): qty 8
  Filled 2011-11-01: qty 4
  Filled 2011-11-01: qty 8

## 2011-11-01 NOTE — Consult Note (Signed)
Reason for Consult: Intracerebral hemorrhage Referring Physician: Dr. Jesse Fall is an 61 y.o. male.   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 He had been awake and alert and following commands until sometime this morning when he became much more somnolent. He had a followup CT scan of the head at 12:30 AM today which showed no change compared to a scan 2 days ago or his MRI yesterday. However with this change in neurologic status Dr. Pearlean Brownie was worried about intracranial hypertension and consulted me regarding potential placement of a ventriculostomy drain. The patient is unable to participate and the history or physical to a significant degree because of his somnolence.   Past Medical History  Diagnosis Date  . Hypertension     History reviewed. No pertinent past surgical history.  No Known Allergies  History  Substance Use Topics  . Smoking status: Never Smoker   . Smokeless tobacco: Never Used  . Alcohol Use: Yes    History reviewed. No pertinent family history.   Review of Systems  Positive ROS: Unable to obtain  All other systems have been reviewed and were otherwise negative with the exception of those mentioned in the HPI and as above.  Objective: Vital signs in last 24 hours: Temp:  [98.8 F (37.1 C)-99.9 F (37.7 C)] 98.9 F (37.2 C) (08/21 0400) Pulse Rate:  [51-91] 52  (08/21 0945) Resp:  [0-23] 17  (08/21 0945) BP: (144-205)/(58-106) 169/77 mmHg (08/21 0945) SpO2:  [94 %-99 %] 98 % (08/21 0945)  General Appearance: Somnolent and snoring but arousable to noxious stimuli Head: Normocephalic, without obvious abnormality, atraumatic Eyes: PERRL,         NEUROLOGIC:   Mental status: Again somnolent and snoring but arousable to noxious stimuli, he will open his eyes to noxious stimuli and appropriately asked me to stop rubbing his sternum or  pinching him on the shoulder, and follow some commands Motor Exam - appears left hemiparetic but quickly localizes on the right Sensory Exam - unable to test Coordination - unable to test Gait -unable to test Balance -unable to test Cranial Nerves: I: smell Not tested  II: visual acuity  OS: na   OD: na     II: pupils Equal, round, reactive to light  III,VII: ptosis None obviously apparent   III,IV,VI: extraocular muscles  Full ROM as best I can to   V: mastication  unable to test   V: facial light touch sensation   unable to test   V,VII: corneal reflex  Present  VII: facial muscle function - upper  Normal  VII: facial muscle function - lower Normal  VIII: hearing Not tested  IX: soft palate elevation   unable to test  IX,X: gag reflex  not tested   XI: trapezius strength   not tested   XI: sternocleidomastoid strength  not tested   XI: neck flexion strength   not tested   XII: tongue strength   not tested     Data Review Lab Results  Component Value Date   WBC 14.6* 10/30/2011   HGB 15.8 10/30/2011   HCT 43.4 10/30/2011   MCV 86.6 10/30/2011   PLT 200 10/30/2011   Lab Results  Component Value Date   NA 142 11/01/2011   K 3.3* 11/01/2011   CL 107 11/01/2011   CO2 24 11/01/2011   BUN 16 11/01/2011  CREATININE 0.82 11/01/2011   GLUCOSE 132* 11/01/2011   Lab Results  Component Value Date   INR 1.02 10/30/2011    Radiology: Ct Head Wo Contrast  10/30/2011  *RADIOLOGY REPORT*  Clinical Data: Larey Seat on floor.  Uneven pupils.  CT HEAD WITHOUT CONTRAST  Technique:  Contiguous axial images were obtained from the base of the skull through the vertex without contrast.  Comparison: None.  Findings: There is an acute hemorrhage in the right thalamus measuring 2.5 x 4.1 cm.  There is surrounding edema.  The hemorrhage extends into the right lateral ventricle with intraventricular hemorrhage present.  There is some mass effect with sulci effacement.  No significant midline shift.  No  effacement of basal cisterns.  No additional hemorrhage is identified.  No abnormal extra-axial fluid collections.  Ventricles are not dilated.  No depressed skull fractures.  Visualized paranasal sinuses and mastoid air cells are not opacified.  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  Surrounding edema and mild mass effect without midline shift.  Critical Value/emergent results were called by telephone at the time of interpretation on 10/30/2011 at 2322 hours to Dr. Christain Sacramento, who verbally acknowledged these results.   Original Report Authenticated By: Marlon Pel, M.D.    Mr [facility] Wo Contrast  11/01/2011  *RADIOLOGY REPORT*  Clinical Data:  Follow-up intraparenchymal hemorrhage.  MRI HEAD WITHOUT CONTRAST MRA HEAD WITHOUT CONTRAST  Technique:  Multiplanar, multiecho pulse sequences of the brain and surrounding structures were obtained without intravenous contrast. Angiographic images of the head were obtained using MRA technique without contrast.  Comparison:  Head CT 10/30/2011  MRI HEAD  Findings:  There is no primary brain stem finding.  There is hemosiderin deposition in the inferior cerebellum on the right consistent with an old hemorrhage.  The remainder the cerebellum appears normal.  There is an acute intraparenchymal hematoma measuring 4.1 x 2.5 cm in diameter with the epicenter in the right thalamus, with extension into the right cerebral peduncle and the radiating white matter tracts on the right.  There is surrounding vasogenic edema.  There is mass effect with flattening and distortion of the third ventricle.  I do not believe there is obstructive hydrocephalus at this moment.  There is a small amount of layering blood products in the occipital horns of the lateral ventricles right more than left.  There are areas of hemosiderin deposition along the surface of the brain the frontal regions. This could relate to old head  trauma.  It is possible that some of this could be related to the present hemorrhage.  Elsewhere, the brain shows mild chronic small vessel disease of the hemispheric white matter.  No sign of acute ischemic infarction. No extra-axial collection.  No pituitary mass.  No inflammatory sinus disease.  IMPRESSION: 4.1 x 2.5 cm acute hematoma with the epicenter in the right thalamus as outlined above.  Surrounding edema.  Mass effect with displacement distortion of the third ventricle but no suspicion of obstructive hydrocephalus at this moment.  Small amount of intraventricular blood.  MRA HEAD  Findings: There is no evidence of high flow vascular malformation in the region of the right sided hematoma.  Both internal carotid arteries are patent into the brain.  The right internal carotid artery supplies the right middle cerebral artery territory and both anterior cerebral artery territories.  No evidence of right-sided aneurysm or vascular malformation.  The left internal carotid artery supplies only the left  middle cerebral artery territory.  No aneurysm or vascular malformation.  Both vertebral arteries are patent to the basilar.  No basilar stenosis.  Posterior circulation branch vessels appear normal.  IMPRESSION: No evidence of high flow vascular malformation.  No large or medium vessel occlusion, correctable stenosis or aneurysm.   Original Report Authenticated By: Thomasenia Sales, M.D.    Mr Brain Wo Contrast  11/01/2011  *RADIOLOGY REPORT*  Clinical Data:  Follow-up intraparenchymal hemorrhage.  MRI HEAD WITHOUT CONTRAST MRA HEAD WITHOUT CONTRAST  Technique:  Multiplanar, multiecho pulse sequences of the brain and surrounding structures were obtained without intravenous contrast. Angiographic images of the head were obtained using MRA technique without contrast.  Comparison:  Head CT 10/30/2011  MRI HEAD  Findings:  There is no primary brain stem finding.  There is hemosiderin deposition in the inferior  cerebellum on the right consistent with an old hemorrhage.  The remainder the cerebellum appears normal.  There is an acute intraparenchymal hematoma measuring 4.1 x 2.5 cm in diameter with the epicenter in the right thalamus, with extension into the right cerebral peduncle and the radiating white matter tracts on the right.  There is surrounding vasogenic edema.  There is mass effect with flattening and distortion of the third ventricle.  I do not believe there is obstructive hydrocephalus at this moment.  There is a small amount of layering blood products in the occipital horns of the lateral ventricles right more than left.  There are areas of hemosiderin deposition along the surface of the brain the frontal regions. This could relate to old head trauma.  It is possible that some of this could be related to the present hemorrhage.  Elsewhere, the brain shows mild chronic small vessel disease of the hemispheric white matter.  No sign of acute ischemic infarction. No extra-axial collection.  No pituitary mass.  No inflammatory sinus disease.  IMPRESSION: 4.1 x 2.5 cm acute hematoma with the epicenter in the right thalamus as outlined above.  Surrounding edema.  Mass effect with displacement distortion of the third ventricle but no suspicion of obstructive hydrocephalus at this moment.  Small amount of intraventricular blood.  MRA HEAD  Findings: There is no evidence of high flow vascular malformation in the region of the right sided hematoma.  Both internal carotid arteries are patent into the brain.  The right internal carotid artery supplies the right middle cerebral artery territory and both anterior cerebral artery territories.  No evidence of right-sided aneurysm or vascular malformation.  The left internal carotid artery supplies only the left middle cerebral artery territory.  No aneurysm or vascular malformation.  Both vertebral arteries are patent to the basilar.  No basilar stenosis.  Posterior  circulation branch vessels appear normal.  IMPRESSION: No evidence of high flow vascular malformation.  No large or medium vessel occlusion, correctable stenosis or aneurysm.   Original Report Authenticated By: Thomasenia Sales, M.D.    Dg Chest Port 1 View  10/31/2011  *RADIOLOGY REPORT*  Clinical Data: Unresponsive.  Possible aspiration  PORTABLE CHEST - 1 VIEW  Comparison: None.  Findings: Lungs are clear without infiltrate or effusion.  Negative for heart failure.  Cardiac silhouette is mildly enlarged.  IMPRESSION: No acute abnormality.   Original Report Authenticated By: Camelia Phenes, M.D.    CT scan: Really shows no change whatsoever in the size of the hemorrhage, surrounding edema, the mass effect or shift, or in ventricular size since his scan 2 days ago  Assessment/Plan:  At this point I find him to be arousable with noxious stimuli, appropriate in his response to that, following some commands, and localizing briskly with his right side. He may be more somnolent than he was, and therefore a CT scan of the head is being repeated though I think the yield is going to be low. If there is no change in that CT scan I would not recommend ventriculostomy drainage at this time. That likely carries more risk than potential benefit at this point. You could consider treating him empirically for intracranial hypertension, as he likely has some element of that, though I can't state it is likely increased over what it was yesterday or the day before. Certainly if the CT scan has changed then we could reconsider our options.   Nat Lowenthal S 11/01/2011 10:02 AM

## 2011-11-01 NOTE — Progress Notes (Signed)
Stroke Team Progress Note  HISTORY Anthony Hardin is an 61 y.o. male who was last seen normal on 8/16 after work. Patient reports that he began to feel numbness and weakness on his left side on Saturday. Reports that he was unable to move and in attempting to move fell out of the bed and onto the floor. Was unable to get off the floor and was found by co-workers today. EMS was called and patient was brought in for evaluation  SUBJECTIVE No complaints. He is taking diet without difficulty  OBJECTIVE Most recent Vital Signs: Filed Vitals:   11/01/11 0700 11/01/11 0715 11/01/11 0730 11/01/11 0745  BP: 185/68 171/82 179/83 187/85  Pulse: 71 73 54 54  Temp:      TempSrc:      Resp: 15 15 11 15   Height:      Weight:      SpO2: 97% 97% 99% 97%   CBG (last 3)  Basename 10/31/11 0052  GLUCAP 146*   Intake/Output from previous day: 08/20 0701 - 08/21 0700 In: 2910.4 [I.V.:2910.4] Out: 2350 [Urine:2350]  IV Fluid Intake:     . sodium chloride 110 mL/hr at 11/01/11 0548  . niCARDipine Stopped (11/01/11 0715)    MEDICATIONS    . amLODipine  5 mg Oral Daily  . haloperidol lactate      . haloperidol lactate  2 mg Intravenous Once  . LORazepam  1 mg Intravenous Once  . pantoprazole  20 mg Oral Q1200  . pneumococcal 23 valent vaccine  0.5 mL Intramuscular Tomorrow-1000  . senna-docusate  1 tablet Oral BID  . DISCONTD: pantoprazole (PROTONIX) IV  40 mg Intravenous QHS   PRN:  acetaminophen, acetaminophen, labetalol, ondansetron (ZOFRAN) IV  Diet:  DYS 3 THIN liquids Activity:  Ambulate  DVT Prophylaxis:  SCD  CLINICALLY SIGNIFICANT STUDIES Basic Metabolic Panel:   Lab 10/30/11 2255  NA 143  K 3.7  CL 104  CO2 26  GLUCOSE 136*  BUN 25*  CREATININE 0.96  CALCIUM 10.4  MG --  PHOS --   Liver Function Tests:   Lab 10/30/11 2255  AST 46*  ALT 28  ALKPHOS 55  BILITOT 1.5*  PROT 8.0  ALBUMIN 4.6   CBC:   Lab 10/30/11 2255  WBC 14.6*  NEUTROABS 11.6*  HGB  15.8  HCT 43.4  MCV 86.6  PLT 200   Coagulation:   Lab 10/30/11 2255  LABPROT 13.6  INR 1.02   Cardiac Enzymes:   Lab 10/30/11 2301 10/30/11 2255  CKTOTAL -- 1299*  CKMB -- --  CKMBINDEX -- --  TROPONINI <0.30 --    Urine Drug Screen:  pending  CT of the brain 11/01/2011   Right thalamic hemorrhage with ventricular extension.  No significant change from 10/30/2011.   10/30/2011   Acute intraparenchymal hemorrhage in the right thalamus extending to the right lateral ventricle.  Surrounding edema and mild mass effect without midline shift.   MRI of the brain  MRA of the brain   CXR 10/31/2011   No acute abnormality.  EKG  NSR.   Therapy Recommendations PT - CIR; OT -CIR ; ST - CIR.  Physical Exam   Pleasant middle aged Caucasian male not in distress.Awake alert. Afebrile. Head is nontraumatic. Neck is supple without bruit. Hearing is normal. Cardiac exam no murmur or gallop. Lungs are clear to auscultation. Distal pulses are well felt.  Neurological Exam : AO x3 with normal speech and language function. Extraocular movements are  full range with mild right gaze preference. Dense left homonymous hemianopsia with decreased blink to threat. Moderate left lower facial weakness. Tongue is midline. Motor system exam reveals dense left hemiplegia with 0/5 strength. Good purposeful antigravity movements on the right side. Left hemianesthesia. Normal coordination and strength on the right. Left plantar is upgoing. Right plantar is downgoing. Gait was not tested.  ASSESSMENT [name] is a 61 y.o. 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 He was placed on Cardene for blood pressure control. It was stopped during the night, but he was back on it this morning due to elevated blood pressure. 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 On none prior to admission. Now on none due to  bleed for secondary stroke prevention. Patient with resultant hemiparesis. Therapies are recommending inpatient rehabilitation.  -Malignant hypertension  Hospital day # 2  TREATMENT/PLAN -inpatient rehabilitation consult -increase Norvasc to 10 mg daily and add hydrochlorothiazide 25 mg daily -Increase labetalol to 40 mg when necessary  -Wean Cardene -add Lovenox daily for DVT prophylaxis  Annie Main, MSN, RN, ANVP-BC, ANP-BC, GNP-BC [facility] Pager: [phone] 11/01/2011 8:02 AM  Scribe for Dr. Delia Heady, Stroke Center Medical Director, who has personally reviewed chart, pertinent data, examined the patient and developed the plan of care. Pager:  [phone]

## 2011-11-01 NOTE — Progress Notes (Signed)
PT Cancellation Note     Treatment cancelled today due to pt not responsive enough to participate.  11/01/2011  [place] Bing, PT [phone] (pager)

## 2011-11-01 NOTE — Progress Notes (Signed)
EEG completed at bedside 

## 2011-11-01 NOTE — Consult Note (Signed)
Physical Medicine and Rehabilitation Consult Reason for Consult: Acute intraparenchymal hemorrhage Referring Physician: Dr. Pearlean Brownie   HPI: Anthony Hardin is a 61 y.o. right-handed male with history of hypertension. 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 Admitted 10/31/2011 with diffuse weakness and unable to move and fell out of bed onto the floor. MRI of the brain showed a 4 x 1 x 2.5 cm acute hematoma right thalamus with mass effect and displacement distortion of the third ventricle but no suspicion of obstructive hydrocephalus. MRA of the head with no stenosis or occlusion. Close monitoring of blood pressure with intravenous Cardene. Neurology followup with workup ongoing. Noted bouts of diplopia as well as left inattention. Subcutaneous Lovenox was initiated 10/31/2011 for DVT prophylaxis. Followup speech therapy for questionable dysphagia maintain on mechanical soft diet with thin liquids. Physical and occupational therapy evaluations completed and ongoing with recommendations of physical medicine rehabilitation consult to consider inpatient rehabilitation services.  Patient has had poor level of alertness most the day today. He will wake up for short periods of time recognize visitors and answer orientation questions. Was unable to work with therapy today Review of Systems  Eyes: Positive for blurred vision and double vision.  Neurological: Positive for dizziness and headaches.  All other systems reviewed and are negative.   Past Medical History  Diagnosis Date  . Hypertension    History reviewed. No pertinent past surgical history. History reviewed. No pertinent family history. Social History:  reports that he has never smoked. He has never used smokeless tobacco. He reports that he drinks alcohol. He reports that he does not use illicit drugs. Allergies: No Known Allergies Medications Prior to Admission  Medication Sig Dispense Refill  . beta carotene w/minerals  (OCUVITE) tablet Take 1 tablet by mouth daily.        Home: Home Living Lives With: Alone Available Help at Discharge: Other (Comment) Type of Home: House Home Access: Stairs to enter [employer] of Steps: 2 Entrance Stairs-Rails: Right Home Layout: Two level;Able to live on main level with bedroom/bathroom;1/2 bath on main level Bathroom Shower/Tub: Tub/shower unit;Curtain Bathroom Toilet: Standard Home Adaptive Equipment: Hand-held shower hose  Functional History: Prior Function Able to Take Stairs?: Yes Driving: Yes Vocation: Full time employment Functional Status:  Mobility: Bed Mobility Bed Mobility: Rolling Right;Right Sidelying to Sit;Sit to Supine Rolling Right: Other (comment);3: Mod assist (and full assist of L Le) Right Sidelying to Sit: 3: Mod assist;HOB elevated Sit to Supine: 3: Mod assist;HOB flat Transfers Transfers: Sit to Stand;Stand to Sit Sit to Stand: 3: Mod assist;From bed;Without upper extremity assist Stand to Sit: 4: Min assist;To bed Ambulation/Gait Ambulation/Gait Assistance: Not tested (comment) Stairs: No Wheelchair Mobility Wheelchair Mobility: No  ADL: ADL Transfers/Ambulation Related to ADLs: not appropriate at this time ADL Comments: Pt supine on arrival and demonstrates fatigue. pt with Lt side inattention and sensory deficits. Pt has x3 coworkers present. Coworkers report that pt has a sister and elderly parents   Cognition: Cognition Overall Cognitive Status: Impaired Arousal/Alertness: Lethargic Orientation Level: Oriented X4 Attention: Focused;Sustained Focused Attention: Appears intact Sustained Attention: Impaired Sustained Attention Impairment: Verbal basic;Functional basic Memory: Appears intact (long term and basic short term appears intact) Awareness: Impaired Awareness Impairment: Emergent impairment;Intellectual impairment (albe to verbalize deficits but not how they impact function) Problem Solving:  Impaired Problem Solving Impairment: Functional basic Executive Function: Initiating;Self Monitoring;Self Correcting Initiating: Impaired Initiating Impairment: Functional basic Self Monitoring: Impaired Self Monitoring Impairment: Functional basic Self Correcting: Impaired Self  Correcting Impairment: Functional basic Behaviors: Impulsive Safety/Judgment: Impaired Comments: decreased safety awareness Cognition Overall Cognitive Status: Appears within functional limits for tasks assessed/performed Arousal/Alertness: Lethargic Orientation Level: Appears intact for tasks assessed Behavior During Session: Lethargic  Blood pressure 163/95, pulse 66, temperature 98.9 F (37.2 C), temperature source Axillary, resp. rate 17, height 5\' 9"  (1.753 m), weight 76.1 kg (167 lb 12.3 oz), SpO2 96.00%. Physical Exam  Vitals reviewed. Constitutional: He appears well-developed.  Eyes:       Pupils are round and reactive to light  Neck: Neck supple. No thyromegaly present.  Cardiovascular: Normal rate and regular rhythm.   Pulmonary/Chest: Effort normal and breath sounds normal. No respiratory distress. He has no wheezes.  Abdominal: Bowel sounds are normal. He exhibits no distension. There is no tenderness.  Neurological:       Patient is a bit lethargic but arousable. Noted left-sided inattention. He was able to name person place as well as occupation. He followed simple commands. It was difficult to keep patient awake during exam  Skin: Skin is warm and dry.  motor strength is 0/5 in the left deltoid, biceps, triceps, grip, hip flexor, knee extensors, ankle dorsiflexor 5/5 in the right deltoid, biceps, triceps, grip, hip flexor, knee extensors, ankle dorsiflexors Sensation unable to assess due to fluctuating mental status There is reduced tone in the left upper and left lower extremity  Results for orders placed during the hospital encounter of 10/30/11 (from the past 24 hour(s))  URINE RAPID  DRUG SCREEN (HOSP PERFORMED)     Status: Normal   Collection Time   10/31/11 11:10 AM      Component Value Range   Opiates NONE DETECTED  NONE DETECTED   Cocaine NONE DETECTED  NONE DETECTED   Benzodiazepines NONE DETECTED  NONE DETECTED   Amphetamines NONE DETECTED  NONE DETECTED   Tetrahydrocannabinol NONE DETECTED  NONE DETECTED   Barbiturates NONE DETECTED  NONE DETECTED  BASIC METABOLIC PANEL     Status: Abnormal   Collection Time   11/01/11  6:45 AM      Component Value Range   Sodium 142  135 - 145 mEq/L   Potassium 3.3 (*) 3.5 - 5.1 mEq/L   Chloride 107  96 - 112 mEq/L   CO2 24  19 - 32 mEq/L   Glucose, Bld 132 (*) 70 - 99 mg/dL   BUN 16  6 - 23 mg/dL   Creatinine, Ser 6.21  0.50 - 1.35 mg/dL   Calcium 9.2  8.4 - 30.8 mg/dL   GFR calc non Af Amer >90  >90 mL/min   GFR calc Af Amer >90  >90 mL/min   Ct Head Wo Contrast  11/01/2011  *RADIOLOGY REPORT*  Clinical Data: Intracranial hemorrhage  CT HEAD WITHOUT CONTRAST  Technique:  Contiguous axial images were obtained from the base of the skull through the vertex without contrast.  Comparison: CT 10/30/2011  Findings: Right thalamic hemorrhage is unchanged measuring 41 x 23 mm.  Mild intraventricular hemorrhage is again noted.  Slight ventricular enlargement is stable.  Mild midline shift approximately 3 mm to the left is unchanged.  Low density edema is seen around the thalamic hemorrhage which is similar.  No acute infarct or mass.  Chronic lacuna in the left basal ganglia.  Small chronic infarct in the left medial parietal lobe.  IMPRESSION: Right thalamic hemorrhage with ventricular extension.  No significant change from 10/30/2011.   Original Report Authenticated By: Camelia Phenes, M.D.  Ct Head Wo Contrast  10/30/2011  *RADIOLOGY REPORT*  Clinical Data: Larey Seat on floor.  Uneven pupils.  CT HEAD WITHOUT CONTRAST  Technique:  Contiguous axial images were obtained from the base of the skull through the vertex without contrast.   Comparison: None.  Findings: There is an acute hemorrhage in the right thalamus measuring 2.5 x 4.1 cm.  There is surrounding edema.  The hemorrhage extends into the right lateral ventricle with intraventricular hemorrhage present.  There is some mass effect with sulci effacement.  No significant midline shift.  No effacement of basal cisterns.  No additional hemorrhage is identified.  No abnormal extra-axial fluid collections.  Ventricles are not dilated.  No depressed skull fractures.  Visualized paranasal sinuses and mastoid air cells are not opacified.  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  Surrounding edema and mild mass effect without midline shift.  Critical Value/emergent results were called by telephone at the time of interpretation on 10/30/2011 at 2322 hours to Dr. Christain Sacramento, who verbally acknowledged these results.   Original Report Authenticated By: Marlon Pel, M.D.    Mr [facility] Wo Contrast  11/01/2011  *RADIOLOGY REPORT*  Clinical Data:  Follow-up intraparenchymal hemorrhage.  MRI HEAD WITHOUT CONTRAST MRA HEAD WITHOUT CONTRAST  Technique:  Multiplanar, multiecho pulse sequences of the brain and surrounding structures were obtained without intravenous contrast. Angiographic images of the head were obtained using MRA technique without contrast.  Comparison:  Head CT 10/30/2011  MRI HEAD  Findings:  There is no primary brain stem finding.  There is hemosiderin deposition in the inferior cerebellum on the right consistent with an old hemorrhage.  The remainder the cerebellum appears normal.  There is an acute intraparenchymal hematoma measuring 4.1 x 2.5 cm in diameter with the epicenter in the right thalamus, with extension into the right cerebral peduncle and the radiating white matter tracts on the right.  There is surrounding vasogenic edema.  There is mass effect with flattening and distortion of the third ventricle.   I do not believe there is obstructive hydrocephalus at this moment.  There is a small amount of layering blood products in the occipital horns of the lateral ventricles right more than left.  There are areas of hemosiderin deposition along the surface of the brain the frontal regions. This could relate to old head trauma.  It is possible that some of this could be related to the present hemorrhage.  Elsewhere, the brain shows mild chronic small vessel disease of the hemispheric white matter.  No sign of acute ischemic infarction. No extra-axial collection.  No pituitary mass.  No inflammatory sinus disease.  IMPRESSION: 4.1 x 2.5 cm acute hematoma with the epicenter in the right thalamus as outlined above.  Surrounding edema.  Mass effect with displacement distortion of the third ventricle but no suspicion of obstructive hydrocephalus at this moment.  Small amount of intraventricular blood.  MRA HEAD  Findings: There is no evidence of high flow vascular malformation in the region of the right sided hematoma.  Both internal carotid arteries are patent into the brain.  The right internal carotid artery supplies the right middle cerebral artery territory and both anterior cerebral artery territories.  No evidence of right-sided aneurysm or vascular malformation.  The left internal carotid artery supplies only the left middle cerebral artery territory.  No aneurysm or vascular malformation.  Both vertebral arteries are patent to the basilar.  No basilar stenosis.  Posterior  circulation branch vessels appear normal.  IMPRESSION: No evidence of high flow vascular malformation.  No large or medium vessel occlusion, correctable stenosis or aneurysm.   Original Report Authenticated By: Thomasenia Sales, M.D.    Mr Brain Wo Contrast  11/01/2011  *RADIOLOGY REPORT*  Clinical Data:  Follow-up intraparenchymal hemorrhage.  MRI HEAD WITHOUT CONTRAST MRA HEAD WITHOUT CONTRAST  Technique:  Multiplanar, multiecho pulse sequences of  the brain and surrounding structures were obtained without intravenous contrast. Angiographic images of the head were obtained using MRA technique without contrast.  Comparison:  Head CT 10/30/2011  MRI HEAD  Findings:  There is no primary brain stem finding.  There is hemosiderin deposition in the inferior cerebellum on the right consistent with an old hemorrhage.  The remainder the cerebellum appears normal.  There is an acute intraparenchymal hematoma measuring 4.1 x 2.5 cm in diameter with the epicenter in the right thalamus, with extension into the right cerebral peduncle and the radiating white matter tracts on the right.  There is surrounding vasogenic edema.  There is mass effect with flattening and distortion of the third ventricle.  I do not believe there is obstructive hydrocephalus at this moment.  There is a small amount of layering blood products in the occipital horns of the lateral ventricles right more than left.  There are areas of hemosiderin deposition along the surface of the brain the frontal regions. This could relate to old head trauma.  It is possible that some of this could be related to the present hemorrhage.  Elsewhere, the brain shows mild chronic small vessel disease of the hemispheric white matter.  No sign of acute ischemic infarction. No extra-axial collection.  No pituitary mass.  No inflammatory sinus disease.  IMPRESSION: 4.1 x 2.5 cm acute hematoma with the epicenter in the right thalamus as outlined above.  Surrounding edema.  Mass effect with displacement distortion of the third ventricle but no suspicion of obstructive hydrocephalus at this moment.  Small amount of intraventricular blood.  MRA HEAD  Findings: There is no evidence of high flow vascular malformation in the region of the right sided hematoma.  Both internal carotid arteries are patent into the brain.  The right internal carotid artery supplies the right middle cerebral artery territory and both anterior cerebral  artery territories.  No evidence of right-sided aneurysm or vascular malformation.  The left internal carotid artery supplies only the left middle cerebral artery territory.  No aneurysm or vascular malformation.  Both vertebral arteries are patent to the basilar.  No basilar stenosis.  Posterior circulation branch vessels appear normal.  IMPRESSION: No evidence of high flow vascular malformation.  No large or medium vessel occlusion, correctable stenosis or aneurysm.   Original Report Authenticated By: Thomasenia Sales, M.D.    Dg Chest Port 1 View  10/31/2011  *RADIOLOGY REPORT*  Clinical Data: Unresponsive.  Possible aspiration  PORTABLE CHEST - 1 VIEW  Comparison: None.  Findings: Lungs are clear without infiltrate or effusion.  Negative for heart failure.  Cardiac silhouette is mildly enlarged.  IMPRESSION: No acute abnormality.   Original Report Authenticated By: Camelia Phenes, M.D.     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 Does the need for close, 24 hr/day medical supervision in concert with the patient's rehab needs make it unreasonable for this patient to be served in a less intensive setting? Potentially 2. Co-Morbidities requiring supervision/potential complications: malignant hypertension 3. Due  to bladder management, bowel management, safety, skin/wound care, disease management, medication administration and patient education, does the patient require 24 hr/day rehab nursing? Potentially 4. Does the patient require coordinated care of a physician, rehab nurse, PT (1-2 hrs/day, 5 days/week), OT (1-2 hrs/day, 5 days/week) and SLP (0.5-1 hrs/day, 5 days/week) to address physical and functional deficits in the context of the above medical diagnosis(es)? Potentially Addressing deficits in the following areas: balance, endurance, locomotion, strength, transferring, bowel/bladder control, bathing,  dressing, feeding, grooming, toileting, cognition and psychosocial support 5. Can the patient actively participate in an intensive therapy program of at least 3 hrs of therapy per day at least 5 days per week? No 6. The potential for patient to make measurable gains while on inpatient rehab is good 7. Anticipated functional outcomes upon discharge from inpatient rehab are min assist mobility with PT, min assist ADLs with OT, direct care, 100% orientation, improved level of alertness to allow for therapy participation with SLP. 8. Estimated rehab length of stay to reach the above functional goals is: 4 weeks 9. Does the patient have adequate social supports to accommodate these discharge functional goals? Potentially 10. Anticipated D/C setting: Home 11. Anticipated post D/C treatments: HH therapy 12. Overall Rehab/Functional Prognosis: good  RECOMMENDATIONS: This patient's condition is appropriate for continued rehabilitative care in the following setting: if patient has 24-hour per day 7 days per week care at a min assist level post discharge then CIR. If not then may need SNF placement once medically stable. Patient has agreed to participate in recommended program. Potentially Note that insurance prior authorization may be required for reimbursement for recommended care.  Comment:currently he is not able to spell CIR level therapies. Continue current PT, OT, SLP. Rehabilitation RN to followup. See comments above    11/01/2011

## 2011-11-01 NOTE — Progress Notes (Signed)
Speech Language Pathology Treatment: Cognition, dysphagia Patient Details Name: Anthony Hardin MRN: [id number] DOB: 04-Apr-1950 Today's Date: 11/01/2011 Time: 4098-1191 SLP Time Calculation (min): 31 min  Assessment / Plan / Recommendation Clinical Impression  Treatment focused on both cognitive skills development and diet tolerance following bedside evaluation of swallowing 8/20. Patient more lethargic than during initial evaluation however arouses with max clinician verbal cueing. Patient able sustain attention to functional self feeding ADL with max clinician verbal and tactile cueing with lethargy playing a large role. Today, required mod-max cues for intellectual awareness of deficits and mod-max verbal, visual, and tactile assistance for basic problem solving.  Dysphagia treatment focused on diet tolerance with dysphagia 3 diet, thin liquids. No overt s/s of aspiration observed, with patient requiring min SLP verbal cueing for use of lingual sweep to clear mild left sided buccal residuals and moderate assist for small bites, as once self feeding initiated (with max verbal cues), patient impulsive with intake. Intake poor overall due to lethargy. RN aware. Current diet continues to remain appropriate however aspiration risk increased given current decreased alertness. SLP will continue to f/u.      SLP Plan  Continue with current plan of care    Pertinent Vitals/Pain n/a  SLP Goals  SLP Goals Potential to Achieve Goals: Good Progress/Goals/Alternative treatment plan discussed with pt/caregiver and they: Agree SLP Goal #1: Patient will sustain attention to basic functional ADL with min verbal cues.  SLP Goal #1 - Progress: Progressing toward goal SLP Goal #2: Patient will attend to left visual field during basic functional ADLs with min visual cues SLP Goal #2 - Progress: Progressing toward goal SLP Goal #3: Patient will initiate carryout of basic functional and familiar ADL with no more  than 2 verbal or visual cues.  SLP Goal #3 - Progress: Progressing toward goal SLP Goal #4: Patient will demonstrate basic emergent awareness of problems as related to acute deficits during functional ADLs with mod verbal cues. SLP Goal #4 - Progress: Progressing toward goal            Swallowing Goals  SLP Swallowing Goals Patient will consume recommended diet without observed clinical signs of aspiration with: Minimal assistance Swallow Study Goal #1 - Progress: Progressing toward goal Patient will utilize recommended strategies during swallow to increase swallowing safety with: Minimal assistance Swallow Study Goal #2 - Progress: Progressing toward goal  General Temperature Spikes Noted: No Respiratory Status: Room air Behavior/Cognition: Lethargic;Cooperative;Pleasant mood Oral Cavity - Dentition: Adequate natural dentition Patient Positioning: Upright in bed  Oral Cavity - Oral Hygiene Does patient have any of the following "at risk" factors?: None of the above Brush patient's teeth BID with toothbrush (using toothpaste with fluoride): Yes       Ferdinand Lango MA, CCC-SLP [phone]   Karsten Howry Meryl 11/01/2011, 9:25 AM      General Temperature Spikes Noted: No Respiratory Status: Room air Behavior/Cognition: Lethargic;Cooperative;Pleasant mood Oral Cavity - Dentition: Adequate natural dentition Patient Positioning: Upright in bed  Oral Cavity - Oral Hygiene Does patient have any of the following "at risk" factors?: None of the above Brush patient's teeth BID with toothbrush (using toothpaste with fluoride): Yes   Treatment Treatment focused on: Cognition (dysphagia) Skilled Treatment: Treatment focused on both cognitive skills development and diet tolerance following bedside evaluation of swallowing 8/20. Patient more lethargic than during initial evaluation however arouses with max clinician verbal cueing. Patient able sustain attention to functional self  feeding ADL with max clinician verbal and tactile cueing  with lethargy playing a large role. Today, required mod-max cues for intellectual awareness of deficits and mod-max verbal, visual, and tactile assistance for basic problem solving.  Dysphagia treatment focused on diet tolerance with dysphagia 3 diet, thin liquids. No overt s/s of aspiration observed, with patient requiring min SLP verbal cueing for use of lingual sweep to clear mild left sided buccal residuals and moderate assist for small bites, as once self feeding initiated (with max verbal cues), patient impulsive with intake. Intake poor overall due to lethargy. RN aware. Current diet continues to remain appropriate however aspiration risk increased given current decreased alertness. SLP will continue to f/u.     GO     Taijuan Serviss Meryl 11/01/2011, 9:24 AM

## 2011-11-02 LAB — URINALYSIS, ROUTINE W REFLEX MICROSCOPIC
Glucose, UA: 100 mg/dL — AB
Hgb urine dipstick: NEGATIVE
Ketones, ur: 15 mg/dL — AB
Protein, ur: NEGATIVE mg/dL
Urobilinogen, UA: 1 mg/dL (ref 0.0–1.0)

## 2011-11-02 LAB — CBC
HCT: 37.8 % — ABNORMAL LOW (ref 39.0–52.0)
Hemoglobin: 13.6 g/dL (ref 13.0–17.0)
MCH: 31.1 pg (ref 26.0–34.0)
MCHC: 36 g/dL (ref 30.0–36.0)
RDW: 12.6 % (ref 11.5–15.5)

## 2011-11-02 LAB — BASIC METABOLIC PANEL
BUN: 15 mg/dL (ref 6–23)
Creatinine, Ser: 0.8 mg/dL (ref 0.50–1.35)
GFR calc Af Amer: >90 mL/min (ref >90)
GFR calc non Af Amer: >90 mL/min (ref >90)
Glucose, Bld: 168 mg/dL — ABNORMAL HIGH (ref 70–99)

## 2011-11-02 MED ORDER — NICARDIPINE HCL IN NACL 20-0.86 MG/200ML-% IV SOLN
3.0000 mg/h | INTRAVENOUS | Status: DC
  Administered 2011-11-02: 15 mg/h via INTRAVENOUS
  Administered 2011-11-03 (×3): 10 mg/h via INTRAVENOUS
  Administered 2011-11-03: 12 mg/h via INTRAVENOUS
  Filled 2011-11-02 (×7): qty 200

## 2011-11-02 MED ORDER — ENOXAPARIN SODIUM 40 MG/0.4ML ~~LOC~~ SOLN
40.0000 mg | SUBCUTANEOUS | Status: DC
  Administered 2011-11-02 – 2011-11-14 (×13): 40 mg via SUBCUTANEOUS
  Filled 2011-11-02 (×13): qty 0.4

## 2011-11-02 NOTE — Progress Notes (Signed)
Stroke Team Progress Note  HISTORY Anthony Hardin is an 61 y.o. male who was last seen normal on 8/16 after work. Patient reports that he began to feel numbness and weakness on his left side on Saturday. Reports that he was unable to move and in attempting to move fell out of the bed and onto the floor. Was unable to get off the floor and was found by co-workers today. EMS was called and patient was brought in for evaluation  SUBJECTIVE Family at bedside. Pt remains lethargic, but less so than yesterday. Able to take BP pills last evening. Received hydralazine during the night. Still on cardene.  OBJECTIVE Most recent Vital Signs: Filed Vitals:   11/02/11 0509 11/02/11 0600 11/02/11 0700 11/02/11 0730  BP: 158/61 154/87 162/145 162/145  Pulse: 63 71  69  Temp:      TempSrc:      Resp: 19 20  18   Height:      Weight:      SpO2: 96% 96%  95%   CBG (last 3)   Basename 10/31/11 0052  GLUCAP 146*   Intake/Output from previous day: 08/21 0701 - 08/22 0700 In: 3587.9 [P.O.:600; I.V.:2987.9] Out: 2475 [Urine:2475]  IV Fluid Intake:      . sodium chloride 110 mL/hr at 11/02/11 0700  . niCARDipine 10 mg/hr (11/02/11 0700)    MEDICATIONS     . amLODipine  10 mg Oral Daily  . hydrochlorothiazide  25 mg Oral Daily  . pantoprazole  20 mg Oral Q1200  . pneumococcal 23 valent vaccine  0.5 mL Intramuscular Tomorrow-1000  . senna-docusate  1 tablet Oral BID  . DISCONTD: amLODipine  5 mg Oral Daily  . DISCONTD: enoxaparin (LOVENOX) injection  40 mg Subcutaneous Q24H  . DISCONTD: haloperidol lactate  2 mg Intravenous Once   PRN:  acetaminophen, acetaminophen, hydrALAZINE, labetalol, ondansetron (ZOFRAN) IV, DISCONTD: labetalol  Diet:  DYS 3 THIN liquids Activity:  Ambulate  DVT Prophylaxis:  SCD  CLINICALLY SIGNIFICANT STUDIES Basic Metabolic Panel:   Lab 11/01/11 0645 10/30/11 2255  NA 142 143  K 3.3* 3.7  CL 107 104  CO2 24 26  GLUCOSE 132* 136*  BUN 16 25*    CREATININE 0.82 0.96  CALCIUM 9.2 10.4  MG -- --  PHOS -- --   Liver Function Tests:   Lab 10/30/11 2255  AST 46*  ALT 28  ALKPHOS 55  BILITOT 1.5*  PROT 8.0  ALBUMIN 4.6   CBC:   Lab 10/30/11 2255  WBC 14.6*  NEUTROABS 11.6*  HGB 15.8  HCT 43.4  MCV 86.6  PLT 200   Coagulation:   Lab 10/30/11 2255  LABPROT 13.6  INR 1.02   Cardiac Enzymes:   Lab 10/30/11 2301 10/30/11 2255  CKTOTAL -- 1299*  CKMB -- --  CKMBINDEX -- --  TROPONINI <0.30 --   Urinalysis   Urine Drug Screen:  normal  CT of the brain 11/01/2011   Right thalamic hemorrhage with ventricular extension.  No significant change from 10/30/2011.   10/30/2011   Acute intraparenchymal hemorrhage in the right thalamus extending to the right lateral ventricle.  Surrounding edema and mild mass effect without midline shift.   MRI of the brain 11/01/2011  4.1 x 2.5 cm acute hematoma with the epicenter in the right thalamus as outlined above.  Surrounding edema.  Mass effect with displacement distortion of the third ventricle but no suspicion of obstructive hydrocephalus at this moment.  Small amount of intraventricular  blood.   MRA of the brain 11/01/2011   No evidence of high flow vascular malformation.  No large or medium vessel occlusion, correctable stenosis or aneurysm.     CXR 10/31/2011   No acute abnormality.  EKG  NSR.   Therapy Recommendations PT - CIR; OT -CIR ; ST - CIR.  Physical Exam   Pleasant middle aged Caucasian male not in distress.Awake alert. Afebrile. Head is nontraumatic. Neck is supple without bruit. Hearing is normal. Cardiac exam no murmur or gallop. Lungs are clear to auscultation. Distal pulses are well felt.  Neurological Exam : Drowsy.Opens eyes and follows only few commands.Not cooperative for Extraocular movements testing but with mild right gaze preference. Dense left homonymous hemianopsia with decreased blink to threat. Moderate left lower facial weakness. Tongue is midline.  Motor system exam reveals dense left hemiplegia with 0/5 strength. Good purposeful antigravity movements on the right side. Left hemianesthesia. Normal coordination and strength on the right. Left plantar is upgoing. Right plantar is downgoing. Gait was not tested.  ASSESSMENT [name] is a 61 y.o. 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 He was placed on Cardene for blood pressure control. It was stopped during the night, but he was back on it this morning due to elevated blood pressure. 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 On none prior to admission. Now on none due to bleed for secondary stroke prevention. Patient with resultant hemiparesis. Therapies are recommending inpatient rehabilitation. -Neurosurgery consulted for possible ventriculostomy but felt not to be a candidate. 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 Started PO medications 8/21 -bradycardia, HR to 40s after labetalol, which was then changed to hydralazine -rhabdomyolysis, found down. CK high on admission -leukocytosis  Hospital day # 3  TREATMENT/PLAN -inpatient rehabilitation following -recheck CK, BMP, CBC -Wean Cardene. SBP goal <180 -Lovenox daily for DVT prophylaxis. It is safe in hemorrhage patients after 24h if hemorrhage is stable -Long discussion with sister about prognosis, plan of care and answered questions. -D/W Dr Jordan Likes This patient is critically ill and at significant risk of neurological worsening, death and care requires constant monitoring of vital signs, hemodynamics,respiratory and cardiac monitoring,review of multiple databases, neurological assessment, discussion with family, other specialists and medical decision making of high complexity. I spent 30 minutes of neurocritical care time  in the care of  this patient.  Annie Main, MSN, RN, ANVP-BC, ANP-BC, [facility] Pager: [phone] 11/02/2011 7:56 AM  Scribe for Dr. Delia Heady, Stroke Center Medical Director, who has personally reviewed chart, pertinent data, examined the patient and developed the plan of care. Pager:  [phone]

## 2011-11-02 NOTE — Progress Notes (Signed)
11/02/11 1300  PT Visit Information  Last PT Received On 11/02/11  Assistance Needed +2  PT/OT Co-Evaluation/Treatment Yes  PT Time Calculation  PT Start Time 0956  PT Stop Time 1055  PT Time Calculation (min) 59 min  Subjective Data  Subjective Oh Boy.Marland KitchenMarland KitchenOh Boy  Precautions  Precautions Fall  Cognition  Arousal/Alertness Lethargic  Behavior During Session Lethargic  Bed Mobility  Bed Mobility Supine to Sit;Sitting - Scoot to [place] of Bed;Sit to Supine  Supine to Sit 3: Mod assist;HOB elevated  Sitting - Scoot to [place] of Bed 4: Min assist  Sit to Supine 3: Mod assist;HOB flat  Details for Bed Mobility Assistance vc/tc's to get pt to initiate movement; truncal assist  Transfers  Transfers Sit to Stand;Stand to Sit  Sit to Stand 1: +2 Total assist;From bed  Sit to Stand: Patient Percentage 60%  Stand to Sit 1: +2 Total assist;To bed  Stand to Sit: Patient Percentage 0%  Details for Transfer Assistance steady assist standing; total assist sitting due to syncopal episode; truncal assist L side and support R side; assist coming forward.  Ambulation/Gait  Ambulation/Gait Assistance Not tested (comment)  Stairs No  Wheelchair Mobility  Wheelchair Mobility No  Modified Rankin (Stroke Patients Only)  Modified Rankin 5  Balance  Balance Assessed Yes  Static Sitting Balance  Static Sitting - Balance Support Right upper extremity supported;No upper extremity supported;Feet supported  Static Sitting - Level of Assistance 5: Stand by assistance;4: Min assist;Other (comment) (min assist if pt does not use R UE for support)  Static Sitting - Comment/# of Minutes still unable to hold static sitting  when challenge given posteriorly , even with R UE assist  Static Standing Balance  Static Standing - Balance Support Bilateral upper extremity supported;During functional activity  Static Standing - Level of Assistance 3: Mod assist;4: Min assist;Other (comment) (depending on fatigue)  Static  Standing - Comment/# of Minutes 2 minutes before syncopal episode caused pt   PT - End of Session  Equipment Utilized During Treatment Gait belt  Activity Tolerance Other (comment) (treatment limited due to fluctuations in BP/syncope)  Patient left in bed;with call bell/phone within reach;with family/visitor present;Other (comment) (in chair position to get pt acclimated to vertical)  Nurse Communication Mobility status  PT - Assessment/Plan  Comments on Treatment Session treatment limited to bed mobility/sitting except for one standing trial due to BP fluctuation/syncopal episode where pt passed out and lost responsiveness for about 50 seconds  PT Plan Discharge plan remains appropriate  Recommendations for Other Services Rehab consult  Follow Up Recommendations Inpatient Rehab  Equipment Recommended Defer to next venue  Acute Rehab PT Goals  PT Goal Formulation With patient  Time For Goal Achievement 11/14/11  Potential to Achieve Goals Good  PT Goal: Supine/Side to Sit - Progress Progressing toward goal  PT Goal: Sit to Stand - Progress Progressing toward goal  PT Transfer Goal: Bed to Chair/Chair to Bed - Progress Progressing toward goal  PT General Charges  $$ ACUTE PT VISIT 1 Procedure  PT Treatments  $Therapeutic Activity 53-67 mins   11/02/2011  [place] Bing, PT [phone] (pager)

## 2011-11-02 NOTE — Progress Notes (Signed)
Rehab admissions - Evaluated for possible admission.  I spoke with patient's sister.  She lives out of town.  She says patient has a large network of friends, but not 24 hr supervision.  I gave her rehab booklets and told her to work on how patient could have 24 hr supervision after a potential inpatient rehab stay.  Patient not medically ready for inpatient rehab today per [facility]. Sethi.  Will check back again in am.  Call me for questions.  #[phone]

## 2011-11-02 NOTE — Procedures (Signed)
EEG NUMBER:  [id number].  REFERRING PHYSICIAN:  Thana Farr, MD.  INDICATIONS FOR STUDY:  A 61 year old man with acute right thalamic stroke with left side weakness and numbness.  Study was performed due to her left focal slowing as well as to rule out possible focal seizure activity.  DESCRIPTION:  This is a routine EEG recording performed during sleep. The predominant background activity consisted of generalized 2-3 Hz, low to moderate amplitude delta activity with intermittent as well as superimposed 6-7 Hz regular theta activity diffusely.  During stage II sleep symmetrical, sleep spindles, and arousal response is recorded from the frontal and central regions predominantly.  Hyperventilation was not performed.  Photic stimulation also was not performed.  No epileptiform discharges were recorded.  There was no focal predominance of slow wave activity.  INTERPRETATION:  This is a normal EEG recording during sleep.  No evidence of an epileptic disorder was demonstrated.     Noel Christmas, MD    [id number] D:  11/01/2011 12:02:31  T:  11/02/2011 01:18:51  Job #:  [id number]

## 2011-11-02 NOTE — Clinical Social Work Note (Signed)
CSW received consult for possible placement. CSW reviewed chart. CIR is also evaluating for possible admission. Assessment for SNF to follow. Please call with any urgent concerns.   Dede Query, MSW LCSWA [phone] (Coverage for Jacklynn Lewis, MSW, [place])

## 2011-11-02 NOTE — Progress Notes (Signed)
Occupational Therapy Treatment Patient Details Name: Anthony Hardin MRN: [id number] DOB: 07/31/1950 Today's Date: 11/02/2011 Time: 4098-1191 OT Time Calculation (min): 37 min  OT Assessment / Plan / Recommendation Comments on Treatment Session slow progressing due to arousal levels . Pt requires max level of noxious stimulation to arouse during session at points. Pt will be a excellent rehab candidate however decreased arousal limiting progression.     Follow Up Recommendations  Inpatient Rehab    Barriers to Discharge       Equipment Recommendations  Defer to next venue    Recommendations for Other Services Rehab consult  Frequency Min 2X/week   Plan Discharge plan remains appropriate    Precautions / Restrictions Precautions Precautions: Fall   Pertinent Vitals/Pain Unable to verbalize at this time    ADL  Transfers/Ambulation Related to ADLs: not appropriate at this time ADL Comments: Pt supine on arrival with RN entering room for medication. Pt positioned into upright position in bed supported for Rn to give medication in apple sauce. pt required multimodal cues to arouse. Pt with focused attention. Pt supine<>sit EOB. Pt sitting with Rt UE used as suppport. Pt tolerating well. Pt sit<>stand with all weight bearing on Rt side. Pt with limited to no weight bearing on Lt LE. pt with internal rotation of Lt LE. Pt hand over hand (A) to position LT LE. pt with syncopal epsiode of eyes rolled back neck extension mouth slightly open remaining in static standing. Pt total+2 return to supine position. Pt in supine with BIL LE raised BP taken demonstrating 40 point SBP drop. Pt limited verbalization or responses with maximum cueing. Pt fatigued and snoring. Pt responding to painful stimuli.       OT Goals Acute Rehab OT Goals OT Goal Formulation: With patient Time For Goal Achievement: 11/14/11 Potential to Achieve Goals: Good ADL Goals Pt Will Perform Grooming: with min  assist;Supported;Sitting, edge of bed ADL Goal: Grooming - Progress: Not progressing Miscellaneous OT Goals Miscellaneous OT Goal #1: Pt will perform bed mobility at Min (A) level as precursor to adls OT Goal: Miscellaneous Goal #1 - Progress: Not progressing Miscellaneous OT Goal #2: Pt will located 2 out 4 objects (50%) on Lt side with only verbal request to decrease Lt neglect OT Goal: Miscellaneous Goal #2 - Progress: Not progressing Miscellaneous OT Goal #3: Pt will tolerate eye patch schedule to decrease diplopia as precursor to adls OT Goal: Miscellaneous Goal #3 - Progress: Not progressing  Visit Information  Last OT Received On: 11/02/11 Assistance Needed: +2 PT/OT Co-Evaluation/Treatment: Yes               Cognition  Arousal/Alertness: Lethargic Behavior During Session: Lethargic    Mobility Bed Mobility Bed Mobility: Supine to Sit;Sitting - Scoot to [place] of Bed;Sit to Supine Supine to Sit: 3: Mod assist;HOB elevated Sitting - Scoot to Edge of Bed: 4: Min assist Sit to Supine: 3: Mod assist;HOB flat Details for Bed Mobility Assistance: vc/tc's to get pt to initiate movement; truncal assist Transfers Sit to Stand: 1: +2 Total assist;From bed Sit to Stand: Patient Percentage: 60% Stand to Sit: 1: +2 Total assist;To bed Stand to Sit: Patient Percentage: 0% Details for Transfer Assistance: steady assist standing; total assist sitting due to syncopal episode; truncal assist L side and support R side; assist coming forward.   Exercises    Balance Balance Balance Assessed: Yes Static Sitting Balance Static Sitting - Balance Support: Right upper extremity supported;No upper extremity supported;Feet supported Static  Sitting - Level of Assistance: 5: Stand by assistance;4: Min assist;Other (comment) Static Sitting - Comment/# of Minutes: still unable to hold static sitting  when challenge given posteriorly , even with R UE assist Static Standing Balance Static Standing -  Balance Support: Bilateral upper extremity supported;During functional activity Static Standing - Level of Assistance: 3: Mod assist;4: Min assist;Other (comment) Static Standing - Comment/# of Minutes: 2 minutes with syncopal episode  End of Session OT - End of Session Activity Tolerance: Patient limited by fatigue Patient left: in bed;with call bell/phone within reach;with family/visitor present (sister / nephew) Nurse Communication: Precautions  GO     Lucile Shutters 11/02/2011, 2:33 PM Pager: [phone]

## 2011-11-02 NOTE — Progress Notes (Signed)
Patient ID: Anthony Hardin, male   DOB: Aug 12, 1950, 61 y.o.   MRN: [id number] Patient seems stable. Opens his eyes to voice. Occasionally follows commands. He is somewhat fidgety. Verbalizes occasionally. Would follow for now, still do not believe he needs a ventriculostomy at this point. CT scan pending. I would not favor the use of Lovenox at this point.

## 2011-11-03 ENCOUNTER — Inpatient Hospital Stay (HOSPITAL_COMMUNITY): Payer: BC Managed Care – PPO

## 2011-11-03 LAB — BASIC METABOLIC PANEL
BUN: 15 mg/dL (ref 6–23)
Chloride: 100 mEq/L (ref 96–112)
Creatinine, Ser: 0.76 mg/dL (ref 0.50–1.35)
GFR calc Af Amer: >90 mL/min (ref >90)
GFR calc non Af Amer: >90 mL/min (ref >90)
GFR calc non Af Amer: >90 mL/min (ref >90)
Glucose, Bld: 174 mg/dL — ABNORMAL HIGH (ref 70–99)
Glucose, Bld: 131 mg/dL — ABNORMAL HIGH (ref 70–99)
Potassium: 2.5 mEq/L — CL (ref 3.5–5.1)
Potassium: 2.8 mEq/L — ABNORMAL LOW (ref 3.5–5.1)

## 2011-11-03 MED ORDER — POTASSIUM CHLORIDE 10 MEQ/100ML IV SOLN
INTRAVENOUS | Status: AC
Start: 2011-11-03 — End: 2011-11-03
  Administered 2011-11-03: 10 meq via INTRAVENOUS
  Filled 2011-11-03: qty 100

## 2011-11-03 MED ORDER — CLONIDINE HCL 0.1 MG/24HR TD PTWK
0.1000 mg | MEDICATED_PATCH | TRANSDERMAL | Status: DC
  Administered 2011-11-03 – 2011-11-10 (×2): 0.1 mg via TRANSDERMAL
  Filled 2011-11-03 (×2): qty 1

## 2011-11-03 MED ORDER — POTASSIUM CHLORIDE 10 MEQ/100ML IV SOLN
10.0000 meq | INTRAVENOUS | Status: AC
  Administered 2011-11-03 (×3): 10 meq via INTRAVENOUS
  Filled 2011-11-03 (×2): qty 100

## 2011-11-03 MED ORDER — NICARDIPINE HCL IN NACL 40-0.83 MG/200ML-% IV SOLN
3.0000 mg/h | INTRAVENOUS | Status: DC
  Filled 2011-11-03: qty 200

## 2011-11-03 MED ORDER — POTASSIUM CHLORIDE 10 MEQ/100ML IV SOLN
10.0000 meq | INTRAVENOUS | Status: AC
  Administered 2011-11-03 (×3): 10 meq via INTRAVENOUS
  Filled 2011-11-03 (×3): qty 100

## 2011-11-03 MED ORDER — NICARDIPINE HCL IN NACL 20-0.86 MG/200ML-% IV SOLN
3.0000 mg/h | INTRAVENOUS | Status: DC
  Administered 2011-11-03: 10 mg/h via INTRAVENOUS
  Administered 2011-11-03: 5 mg/h via INTRAVENOUS
  Administered 2011-11-03: 7.5 mg/h via INTRAVENOUS
  Administered 2011-11-03 – 2011-11-04 (×3): 10 mg/h via INTRAVENOUS
  Administered 2011-11-04: 7.5 mg/h via INTRAVENOUS
  Administered 2011-11-04: 10 mg/h via INTRAVENOUS
  Administered 2011-11-04: 4 mg/h via INTRAVENOUS
  Filled 2011-11-03 (×12): qty 200

## 2011-11-03 NOTE — Progress Notes (Signed)
Stroke Team Progress Note  HISTORY Anthony Hardin is an 61 y.o. male who was last seen normal on 8/16 after work. Patient reports that he began to feel numbness and weakness on his left side on Saturday. Reports that he was unable to move and in attempting to move fell out of the bed and onto the floor. Was unable to get off the floor and was found by co-workers today. EMS was called and patient was brought in for evaluation  SUBJECTIVE Family at bedside. Pt remains lethargic, on cardene. Did receive po antihypertensives yesterday. Double strength cardene changed to single strength cardene last night.  OBJECTIVE Most recent Vital Signs: Filed Vitals:   11/03/11 0600 11/03/11 0630 11/03/11 0700 11/03/11 0800  BP: 185/67 157/63 175/69 140/70  Pulse: 86 73 84 77  Temp:      TempSrc:      Resp: 17 18 19 17   Height:      Weight:      SpO2: 96% 93% 94% 95%   Intake/Output from previous day: 08/22 0701 - 08/23 0700 In: 5133.7 [P.O.:720; I.V.:4413.7] Out: 3225 [Urine:3225]  IV Fluid Intake:     . sodium chloride 110 mL/hr at 11/03/11 0700  . niCARDipine 10 mg/hr (11/03/11 0700)  . DISCONTD: niCARDipine 10 mg/hr (11/02/11 2200)   MEDICATIONS    . amLODipine  10 mg Oral Daily  . enoxaparin (LOVENOX) injection  40 mg Subcutaneous Q24H  . hydrochlorothiazide  25 mg Oral Daily  . pantoprazole  20 mg Oral Q1200  . senna-docusate  1 tablet Oral BID   PRN:  acetaminophen, acetaminophen, hydrALAZINE, labetalol, ondansetron (ZOFRAN) IV  Diet:  DYS 3 THIN liquids Activity:  Ambulate  DVT Prophylaxis:  SCD  CLINICALLY SIGNIFICANT STUDIES Basic Metabolic Panel:   Lab 11/02/11 1057 11/01/11 0645  NA 137 142  K 2.8* 3.3*  CL 103 107  CO2 23 24  GLUCOSE 168* 132*  BUN 15 16  CREATININE 0.80 0.82  CALCIUM 9.2 9.2  MG -- --  PHOS -- --   Liver Function Tests:   Lab 10/30/11 2255  AST 46*  ALT 28  ALKPHOS 55  BILITOT 1.5*  PROT 8.0  ALBUMIN 4.6   CBC:   Lab 11/02/11  1057 10/30/11 2255  WBC 14.5* 14.6*  NEUTROABS -- 11.6*  HGB 13.6 15.8  HCT 37.8* 43.4  MCV 86.5 86.6  PLT 229 200   Coagulation:   Lab 10/30/11 2255  LABPROT 13.6  INR 1.02   Cardiac Enzymes:   Lab 11/02/11 1057 10/30/11 2301 10/30/11 2255  CKTOTAL 349* -- 1299*  CKMB -- -- --  CKMBINDEX -- -- --  TROPONINI -- <0.30 --   Urinalysis negatvie  Urine Drug Screen:  normal  CT of the brain 11/01/2011   Right thalamic hemorrhage with ventricular extension.  No significant change from 10/30/2011.   10/30/2011   Acute intraparenchymal hemorrhage in the right thalamus extending to the right lateral ventricle.  Surrounding edema and mild mass effect without midline shift.   MRI of the brain 11/01/2011  4.1 x 2.5 cm acute hematoma with the epicenter in the right thalamus as outlined above.  Surrounding edema.  Mass effect with displacement distortion of the third ventricle but no suspicion of obstructive hydrocephalus at this moment.  Small amount of intraventricular blood.   MRA of the brain 11/01/2011   No evidence of high flow vascular malformation.  No large or medium vessel occlusion, correctable stenosis or aneurysm.  CXR 10/31/2011   No acute abnormality.  EKG  NSR.   EEG 11/01/2011 no seziure  Therapy Recommendations PT - CIR; OT -CIR ; ST - CIR.  Physical Exam   Pleasant middle aged Caucasian male not in distress.Awake alert. Afebrile. Head is nontraumatic. Neck is supple without bruit. Hearing is normal. Cardiac exam no murmur or gallop. Lungs are clear to auscultation. Distal pulses are well felt.  Neurological Exam : Drowsy.Opens eyes and follows only few commands.Not cooperative for Extraocular movements testing but with mild right gaze preference. Dense left homonymous hemianopsia with decreased blink to threat. Moderate left lower facial weakness. Tongue is midline. Motor system exam reveals dense left hemiplegia with 0/5 strength. Good purposeful antigravity movements on  the right side. Left hemianesthesia and neglect.. Normal coordination and strength on the right. Left plantar is upgoing. Right plantar is downgoing. Gait was not tested.  ASSESSMENT [name] is a 61 y.o. 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 He remains on Cardene for blood pressure control. It has been intermittently on and off over the past few days. 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 On none prior to admission. Now on none due to bleed for secondary stroke prevention. Patient with resultant hemiparesis and lethargy. Therapies are recommending inpatient rehabilitation.  -lethargy has been ongoing for the past several days. Etiology uncertain. CT of the head unremarkable for change, chest x-ray on 820 normal, UA negative. -Neurosurgery consulted for possible ventriculostomy but felt not to be a candidate. 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 Started PO medications 8/21.  He is now taking by mouth medications. Herbie Baltimore changed from double strength to single straight. Goal remains to wean Cardene -bradycardia, HR to 40s after labetalol, which was then changed to hydralazine -rhabdomyolysis, found down. CK high on admission -leukocytosis  Hospital day # 4  TREATMENT/PLAN -inpatient rehabilitation following for possible inpatient rehabilitation stay, SW following for possible SNF placement -recheck potassium. Replace as needed -Wean Cardene. Add clonidine patch. SBP goal <180 -continue Lovenox daily for DVT prophylaxis. -chest x-ray in the morning -CT of head in the morning -decrease IV fluids to [facility] as total IV intake greater than 200 cc an hour -Long discussion with sister about prognosis, plan of care and answered questions.   This patient is critically ill and at significant risk of neurological worsening, death and care requires constant monitoring of vital  signs, hemodynamics,respiratory and cardiac monitoring,review of multiple databases, neurological assessment, discussion with family, other specialists and medical decision making of high complexity. I spent 30 minutes of neurocritical care time  in the care of  this patient.   Annie Main, MSN, RN, ANVP-BC, ANP-BC, [facility] Pager: [phone] 11/03/2011 8:42 AM  Scribe for Dr. Delia Heady, Stroke Center Medical Director, who has personally reviewed chart, pertinent data, examined the patient and developed the plan of care. Pager:  [phone]

## 2011-11-03 NOTE — Progress Notes (Signed)
Occupational Therapy Treatment Patient Details Name: Anthony Hardin MRN: [id number] DOB: Apr 10, 1950 Today's Date: 11/03/2011 Time: 4098-1191 OT Time Calculation (min): 22 min  OT Assessment / Plan / Recommendation Comments on Treatment Session Slowly progressing and sustained attention of 15 seconds. Pt with delayed response of on average 20 seconds. Pt at times not responding at all.    Follow Up Recommendations  Inpatient Rehab    Barriers to Discharge       Equipment Recommendations  Defer to next venue    Recommendations for Other Services Rehab consult  Frequency Min 2X/week   Plan Discharge plan remains appropriate    Precautions / Restrictions Precautions Precautions: Fall Restrictions Weight Bearing Restrictions: No   Pertinent Vitals/Pain "oh boy" only response    ADL  Equipment Used: Gait belt Transfers/Ambulation Related to ADLs: not appropriate at this time ADL Comments: Pt supine on arrival and verbalizing. Pt with delayed response and sometimes not responding at all.  Pt with tone in Lt arm . provided PROM elbow flexion/ extension supination/pronation shoulder flexion x5 reps. Pt with eye patch applied.     OT Diagnosis:    OT Problem List:   OT Treatment Interventions:     OT Goals Acute Rehab OT Goals OT Goal Formulation: With patient Time For Goal Achievement: 11/14/11 Potential to Achieve Goals: Good ADL Goals Pt Will Perform Grooming: with min assist;Supported;Sitting, edge of bed Miscellaneous OT Goals Miscellaneous OT Goal #1: Pt will perform bed mobility at Min (A) level as precursor to adls OT Goal: Miscellaneous Goal #1 - Progress: Not progressing Miscellaneous OT Goal #2: Pt will located 2 out 4 objects (50%) on Lt side with only verbal request to decrease Lt neglect OT Goal: Miscellaneous Goal #2 - Progress: Not progressing Miscellaneous OT Goal #3: Pt will tolerate eye patch schedule to decrease diplopia as precursor to adls OT Goal:  Miscellaneous Goal #3 - Progress: Progressing toward goals  Visit Information  Last OT Received On: 11/03/11 Assistance Needed: +2 PT/OT Co-Evaluation/Treatment: Yes    Subjective Data      Prior Functioning       Cognition  Overall Cognitive Status: Appears within functional limits for tasks assessed/performed Arousal/Alertness: Lethargic Orientation Level: Appears intact for tasks assessed Behavior During Session: Lethargic    Mobility Bed Mobility Bed Mobility: Supine to Sit;Sitting - Scoot to [place] of Bed;Sit to Supine Supine to Sit: 2: Max assist;HOB elevated Sitting - Scoot to [place] of Bed: 2: Max assist Sit to Supine: 1: +2 Total assist;HOB flat Sit to Supine: Patient Percentage: 30% Details for Bed Mobility Assistance: vc/tc's to get pt to initiate movement; truncal assist Transfers Sit to Stand: 1: +2 Total assist;From bed Sit to Stand: Patient Percentage: 50% Stand to Sit: 1: +2 Total assist;To bed Stand to Sit: Patient Percentage: 50% Details for Transfer Assistance: Pt provided weight shifting to encourage weight bearing on LT LE   Exercises    Balance Static Sitting Balance Static Sitting - Balance Support: No upper extremity supported;Feet supported Static Sitting - Level of Assistance: 2: Max assist Static Sitting - Comment/# of Minutes: posterior lean   End of Session OT - End of Session Activity Tolerance: Patient limited by fatigue Patient left: in bed;with call bell/phone within reach Nurse Communication: Precautions  GO     Lucile Shutters 11/03/2011, 3:22 PM Pager: [phone]

## 2011-11-03 NOTE — Progress Notes (Signed)
Patient ID: Anthony Hardin, male   DOB: 07-24-1950, 61 y.o.   MRN: [id number] Patient is no exam is unchanged. Arousable and will follow commands and answer some questions. Following.

## 2011-11-03 NOTE — Progress Notes (Signed)
Physical Therapy Treatment Patient Details Name: Anthony Hardin MRN: [id number] DOB: February 18, 1951 Today's Date: 11/03/2011 Time: 7253-6644 PT Time Calculation (min): 27 min  PT Assessment / Plan / Recommendation Comments on Treatment Session  treatment limited by pt fatigue.  Pt able to stand well despite inability to voluntarily support his weight on the L or assist with  his L side    Follow Up Recommendations  Inpatient Rehab    Barriers to Discharge        Equipment Recommendations  Defer to next venue    Recommendations for Other Services Rehab consult  Frequency Min 4X/week   Plan Discharge plan remains appropriate    Precautions / Restrictions Precautions Precautions: Fall Restrictions Weight Bearing Restrictions: No   Pertinent Vitals/Pain     Mobility  Bed Mobility Bed Mobility: Supine to Sit;Sitting - Scoot to [place] of Bed;Sit to Supine Rolling Right: 3: Mod assist (full assist to L LE) Right Sidelying to Sit: 3: Mod assist;HOB elevated Supine to Sit: 2: Max assist;HOB elevated Sitting - Scoot to [place] of Bed: 2: Max assist Sit to Supine: 1: +2 Total assist;HOB flat Sit to Supine: Patient Percentage: 30% Details for Bed Mobility Assistance: vc/tc's to get pt to initiate movement; truncal assist Transfers Transfers: Sit to Stand;Stand to Sit Sit to Stand: 1: +2 Total assist;From bed Sit to Stand: Patient Percentage: 50% Stand to Sit: 1: +2 Total assist;To bed Stand to Sit: Patient Percentage: 50% Details for Transfer Assistance: PTprovided weight shifting to encourage weight bearing on LT LE.  Also provided truncal stability for posture  and to facilitate L trunk activiation Ambulation/Gait Ambulation/Gait Assistance: Not tested (comment) Modified Rankin (Stroke Patients Only) Modified Rankin: Severe disability    Exercises     PT Diagnosis:    PT Problem List:   PT Treatment Interventions:     PT Goals Acute Rehab PT Goals Time For Goal Achievement:  11/14/11 Potential to Achieve Goals: Good PT Goal: Supine/Side to Sit - Progress: Progressing toward goal PT Goal: Sit to Stand - Progress: Progressing toward goal PT Transfer Goal: Bed to Chair/Chair to Bed - Progress: Progressing toward goal  Visit Information  Last PT Received On: 11/03/11 Assistance Needed: +2 PT/OT Co-Evaluation/Treatment: Yes    Subjective Data  Subjective: I just haven't got it together   Cognition  Overall Cognitive Status: Appears within functional limits for tasks assessed/performed Arousal/Alertness: Lethargic (but more alert) Orientation Level: Appears intact for tasks assessed Behavior During Session: Lethargic    Balance  Balance Balance Assessed: Yes Static Sitting Balance Static Sitting - Balance Support: No upper extremity supported;Feet supported Static Sitting - Level of Assistance: 2: Max assist Static Sitting - Comment/# of Minutes: posterior and R lean unless uses R UE for support, then can maintain upright if no challenges given Static Standing Balance Static Standing - Balance Support: Bilateral upper extremity supported;During functional activity Static Standing - Level of Assistance: 3: Mod assist;4: Min assist;Other (comment) Static Standing - Comment/# of Minutes: 3-4 minutes while facilitating balance, trying to get som L LE activation  End of Session PT - End of Session Equipment Utilized During Treatment: Gait belt Activity Tolerance: Patient tolerated treatment well Patient left: in bed;with call bell/phone within reach;with family/visitor present;Other (comment) Nurse Communication: Mobility status   GP     Lily Velasquez, Eliseo Gum 11/03/2011, 5:01 PM  11/03/2011  [place] Bing, PT [phone] (pager)

## 2011-11-03 NOTE — Progress Notes (Addendum)
change in mental status, pt no longer following commands, very lethargic. MD made aware. STAT CT scan ordered and completed. Pt resumed previous mental state, following commands, drowsy.   Potassium level still low at 2.8. MD aware, orders received.

## 2011-11-03 NOTE — Progress Notes (Signed)
Speech Language Pathology Treatment Patient Details Name: Anthony Hardin MRN: [id number] DOB: 1950/10/23 Today's Date: 11/03/2011 Time: 1115-1200 SLP Time Calculation (min): 45 min  Assessment / Plan / Recommendation       SLP Plan  Continue with current plan of care       SLP Goals  SLP Goals Potential to Achieve Goals: Good Potential Considerations: Previous level of function;Family/community support Progress/Goals/Alternative treatment plan discussed with pt/caregiver and they: Agree SLP Goal #1 - Progress: Progressing toward goal SLP Goal #2 - Progress: Progressing toward goal SLP Goal #4 - Progress: Progressing toward goal  General Temperature Spikes Noted: No Respiratory Status: Room air Behavior/Cognition: Confused;Requires cueing;Decreased sustained attention;Pleasant mood;Lethargic Oral Cavity - Dentition: Adequate natural dentition Patient Positioning: Upright in bed  Oral Cavity - Oral Hygiene Does patient have any of the following "at risk" factors?: Other - dysphagia Patient is HIGH RISK - Oral Care Protocol followed (see row info): Yes Patient is AT RISK - Oral Care Protocol followed (see row info): Yes   Treatment Treatment focused on: Cognition;Patient/family/caregiver education Family/Caregiver Educated: patient's sister and nephew Skilled Treatment: Purpose of skilled ST treatment for diet tolerance of dysphagia 3 /thin liquids and to improve functional cognitive skills.  Patient with eyes closed entire treatment but alert for brief moments s/p max tactile and verbal cues.  Patient's response to questions targeting intellectual awareness noted be tangential.  Patient required "hand over hand" assist for functional self feeding . Patient observed with mechanical soft consistency x3 and sips of thin water administered by straw. x4 . No observed +s/s of aspiration noted but patient required max verbal and tactile cues to complete oral prep stage.  Oral care  administered s/p  PO trials revealed min oral residue on left buccal area.  Recommend to continue with current diet consistency with full supervision with all meals due to mentation and decreased safety awareness.   Recommend oral care with swabs before and after meals.  RN reports decreased intake  due to decreased LOA.   ST to follow in acute care setting.   Moreen Fowler MS, CCC-SLP [phone]     [facility] 11/03/2011, 3:00 PM

## 2011-11-03 NOTE — Progress Notes (Signed)
CRITICAL VALUE ALERT  Critical value received:  Potassium 2.5  Date of notification:  11/03/2011   Time of notification:  1215  Critical value read back:yes  Nurse who received alert:  Carlos Levering   MD notified (1st page): Dr. Pearlean Brownie  Time of first page:1215  MD notified (2nd page):  Time of second page:  Responding MD:  Dr. Pearlean Brownie  Time MD responded:  1216  Orders received

## 2011-11-03 NOTE — Progress Notes (Signed)
Patient previously on Cardene gtt double strength.  Spoke with pharmacist by the name of Tammy Sours confirming that patient needs central line in order to obtain large amounts of Cardene double strength.  Consulted with pharmacist and had regular strength Cardene ordered.  Patient now receiving normal strength Cardene and BP is stable at this time.  Will continue to monitor patient.

## 2011-11-04 ENCOUNTER — Inpatient Hospital Stay (HOSPITAL_COMMUNITY): Payer: BC Managed Care – PPO

## 2011-11-04 LAB — BASIC METABOLIC PANEL
BUN: 16 mg/dL (ref 6–23)
Chloride: 102 mEq/L (ref 96–112)
GFR calc Af Amer: >90 mL/min (ref >90)
Potassium: 3.6 mEq/L (ref 3.5–5.1)
Sodium: 140 mEq/L (ref 135–145)

## 2011-11-04 MED ORDER — LISINOPRIL 10 MG PO TABS
10.0000 mg | ORAL_TABLET | Freq: Every day | ORAL | Status: DC
  Administered 2011-11-04 – 2011-11-06 (×3): 10 mg via ORAL
  Filled 2011-11-04 (×4): qty 1

## 2011-11-04 MED ORDER — BIOTENE DRY MOUTH MT LIQD
15.0000 mL | Freq: Two times a day (BID) | OROMUCOSAL | Status: DC
  Administered 2011-11-04 – 2011-11-14 (×18): 15 mL via OROMUCOSAL

## 2011-11-04 NOTE — Progress Notes (Signed)
Speech Language Pathology Dysphagia Treatment Patient Details Name: Anthony Hardin MRN: [id number] DOB: 1950/08/29 Today's Date: 11/04/2011 Time: 1130-1200 SLP Time Calculation (min): 30 min  Assessment / Plan / Recommendation Clinical Impression  Purpose of diagnostic treatment for diet tolerance of dysphagia 3 diet consistency and thin liquids.  Patient with increased LOA this treatment and able to keep eyes open for brief periods of time.  Patient observed with mechanical soft ( soft fruit that family friend brought) Mastication noted to be adequate but patient continues to require moderate verbal and tactile cues to maintain sustained attention to complete oral prep stage.  Patient observed with multiple trials of thin water by cup and straw  with no observed s/s of aspiration .  Recommend to continue current diet with full supervision with all meals due to continued cognitive deficits.  ST to follow in acute care setting for possible diet advancement and cognitive treatment.      Diet Recommendation  Continue with Current Diet: Dysphagia 3 (mechanical soft);Thin liquid    SLP Plan Continue with current plan of care      Swallowing Goals  SLP Swallowing Goals Swallow Study Goal #1 - Progress: Progressing toward goal Swallow Study Goal #2 - Progress: Progressing toward goal  General Temperature Spikes Noted: No Respiratory Status: Room air Behavior/Cognition: Pleasant mood;Confused;Requires cueing;Cooperative Oral Cavity - Dentition: Adequate natural dentition Patient Positioning: Upright in bed  Oral Cavity - Oral Hygiene Does patient have any of the following "at risk" factors?: Other - dysphagia Patient is HIGH RISK - Oral Care Protocol followed (see row info): Yes Patient is AT RISK - Oral Care Protocol followed (see row info): Yes   Dysphagia Treatment Treatment focused on: Skilled observation of diet tolerance;Facilitation of oral phase;Facilitation of pharyngeal  phase Treatment Methods/Modalities: Skilled observation;Differential diagnosis Patient observed directly with PO's: Yes Type of PO's observed: Dysphagia 3 (soft);Thin liquids Feeding: Total assist Liquids provided via: Cup;Straw Oral Phase Signs & Symptoms: Left pocketing Type of cueing: Verbal;Tactile Amount of cueing: Moderate   GO    Moreen Fowler MS, CCC-SLP [phone] [facility] 11/04/2011, 2:39 PM

## 2011-11-04 NOTE — Progress Notes (Signed)
Stroke Team Progress Note  HISTORY Anthony Hardin is an 61 y.o. male who was last seen normal on 8/16 after work. Patient reports that he began to feel numbness and weakness on his left side on Saturday. Reports that he was unable to move and in attempting to move fell out of the bed and onto the floor. Was unable to get off the floor and was found by co-workers 08/19. EMS was called and patient was brought in for evaluation. 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  Surrounding edema and mild mass effect without midline shift.   SUBJECTIVE Sister is at bedside. Pt remains lethargic, on cardene. He is in sound sleep during interview, sister reported that he was awake earlier, able to carry on conversation. He has minimal po intake.  Double strength cardene changed to single strength cardene last night.  OBJECTIVE Most recent Vital Signs: Filed Vitals:   11/04/11 0815 11/04/11 0830 11/04/11 0845 11/04/11 0900  BP: 164/77 168/73 156/69 141/62  Pulse: 92 90 87 69  Temp:      TempSrc:      Resp: 28 17 18 18   Height:      Weight:      SpO2: 94% 94% 95% 94%   Intake/Output from previous day: 08/23 0701 - 08/24 0700 In: 3090.6 [P.O.:50; I.V.:2440.6; IV Piggyback:600] Out: 3150 [Urine:3150]  IV Fluid Intake:      . sodium chloride 10 mL/hr at 11/04/11 0700  . niCARDipine 8 mg/hr (11/04/11 0910)  . DISCONTD: niCARDipine    . DISCONTD: niCARDipine 7.5 mg/hr (11/03/11 1013)   MEDICATIONS     . amLODipine  10 mg Oral Daily  . antiseptic oral rinse  15 mL Mouth Rinse BID  . cloNIDine  0.1 mg Transdermal Weekly  . enoxaparin (LOVENOX) injection  40 mg Subcutaneous Q24H  . hydrochlorothiazide  25 mg Oral Daily  . pantoprazole  20 mg Oral Q1200  . potassium chloride  10 mEq Intravenous Q1 Hr x 3  . potassium chloride  10 mEq Intravenous Q1 Hr x 3  . senna-docusate  1 tablet Oral BID   PRN:  acetaminophen,  acetaminophen, hydrALAZINE, labetalol, ondansetron (ZOFRAN) IV  Diet:  DYS 3 THIN liquids Activity:  Ambulate  DVT Prophylaxis:  SCD  CLINICALLY SIGNIFICANT STUDIES Basic Metabolic Panel:   Lab 11/04/11 0422 11/03/11 1642  NA 140 138  K 3.6 2.8*  CL 102 100  CO2 23 26  GLUCOSE 127* 131*  BUN 16 14  CREATININE 0.67 0.74  CALCIUM 9.0 9.3  MG -- --  PHOS -- --   Liver Function Tests:   Lab 10/30/11 2255  AST 46*  ALT 28  ALKPHOS 55  BILITOT 1.5*  PROT 8.0  ALBUMIN 4.6   CBC:   Lab 11/02/11 1057 10/30/11 2255  WBC 14.5* 14.6*  NEUTROABS -- 11.6*  HGB 13.6 15.8  HCT 37.8* 43.4  MCV 86.5 86.6  PLT 229 200   Coagulation:   Lab 10/30/11 2255  LABPROT 13.6  INR 1.02   Cardiac Enzymes:   Lab 11/02/11 1057 10/30/11 2301 10/30/11 2255  CKTOTAL 349* -- 1299*  CKMB -- -- --  CKMBINDEX -- -- --  TROPONINI -- <0.30 --   Urinalysis negatvie  Urine Drug Screen:  normal  CT of the brain 11/01/2011   Right thalamic hemorrhage with ventricular extension.  No significant change from 10/30/2011.   10/30/2011   Acute intraparenchymal hemorrhage in the  right thalamus extending to the right lateral ventricle.  Surrounding edema and mild mass effect without midline shift.   MRI of the brain 11/01/2011  4.1 x 2.5 cm acute hematoma with the epicenter in the right thalamus as outlined above.  Surrounding edema.  Mass effect with displacement distortion of the third ventricle but no suspicion of obstructive hydrocephalus at this moment.  Small amount of intraventricular blood.   MRA of the brain 11/01/2011   No evidence of high flow vascular malformation.  No large or medium vessel occlusion, correctable stenosis or aneurysm.     CXR 10/31/2011   No acute abnormality.  EKG  NSR.   EEG 11/01/2011 no seziure  Therapy Recommendations PT - CIR; OT -CIR ; ST - CIR.  Physical Exam   Pleasant middle aged Caucasian male not in distress. He is in sound sleep. Afebrile. Head is  nontraumatic. Neck is supple without bruit. Hearing is normal. Cardiac exam no murmur or gallop. Lungs are clear to auscultation. Distal pulses are well felt.  Neurological Exam :  Difficulty to be awake, not following command.  Pupil were equal reactive, doll's eye present.  .   Motor system exam reveals dense left hemiplegia, with draw to pain strength 2/5. Good purposeful antigravity movements on the right side.  Left plantar is upgoing. Right plantar is downgoing. Gait was not tested.  ASSESSMENT [name] is a 61 y.o. 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 He remains on Cardene for blood pressure control.  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  Patient with resultant hemiparesis and lethargy. Therapies are recommending inpatient rehabilitation.  -lethargy has been ongoing for the past several days. Etiology uncertain. CT of the head unremarkable for change, chest x-ray on 820 normal, UA negative. -Neurosurgery consulted for possible ventriculostomy but felt not to be a candidate. 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 Started PO medications 8/21.  But limited po intake due to sleepiness.  Cardene changed from double strength to single straight. Goal remains to wean Cardene -bradycardia, HR to 40s after labetalol, which was then changed to hydralazine -rhabdomyolysis, found down. CK high on admission -leukocytosis  Hospital day # 5  TREATMENT/PLAN -inpatient rehabilitation following for possible inpatient rehabilitation stay, SW following for possible SNF placement -recheck potassium. Replace as needed -Wean Cardene. Add clonidine patch. SBP goal <180,  -He is on norvasc 10mg  qday, HCTZ 25mg  qday, add on Lisinopril 10mg  qam. -continue Lovenox daily for DVT prophylaxis.   -Long discussion with sister about prognosis, plan of care and answered  questions.   This patient is critically ill and at significant risk of neurological worsening, death and care requires constant monitoring of vital signs, hemodynamics,respiratory and cardiac monitoring,review of multiple databases, neurological assessment, discussion with family, other specialists and medical decision making of high complexity.

## 2011-11-04 NOTE — Progress Notes (Signed)
Pt's pupils noted to be of unequal size.  Pt's right pupil is a size 3, round, and reactive to light.  Pt's left pupil is a size 4, round, and reactive to light.  MD (neuro) notified and will place order for head CT.

## 2011-11-04 NOTE — Progress Notes (Signed)
Patient ID: Anthony Hardin, male   DOB: [birth year], 61 y.o.   MRN: [id number] BP 141/62  Pulse 69  Temp 99.3 F (37.4 C) (Oral)  Resp 18  Ht 5\' 9"  (1.753 m)  Wt 76.1 kg (167 lb 12.3 oz)  BMI 24.78 kg/m2  SpO2 94% Lethargic, opens eyes Right lateral gAze preference Dense plegia on the left side Ct from yesterday reviewed, no ventric needed.

## 2011-11-04 NOTE — Progress Notes (Signed)
Clinical Social Work Department BRIEF PSYCHOSOCIAL ASSESSMENT 11/04/2011  Patient:  Anthony Hardin, Anthony Hardin     Account Number:  [id number]     Admit date:  10/30/2011  Clinical Social Worker: Lia Foyer, [place]  Date/Time:  11/04/2011 11:49 AM  Referred by:  Physician  Date Referred:   Referred for  SNF Placement   Other Referral:   Interview type:  Family Other interview type:    PSYCHOSOCIAL DATA Living Status:  ALONE Primary support name:  Mignon Pine and Ples Specter Primary support relationship to patient:  SIBLING Degree of support available:   Vested, patient's sister Boneta Lucks, and her son was at patient's bedside.    CURRENT CONCERNS Current Concerns  Post-Acute Placement   Other Concerns:    SOCIAL WORK ASSESSMENT / PLAN CSW consulted to facilitate SNF placement as a back up plan for Inpatient Rehab. Patient currently has an altered mental status, CSW spoke with patient's sister, Mignon Pine ([phone]) and provided supportive counseling on patient's current medical status. CSW provided education on SNFs and received permission to facilitate SNF placement secondary to Inpatient Rehab.    Patient's brother, Colbey Wirtanen ([phone]) will be visiting patient in a few days to assist in d/c planning. Jillyn Hidden is determining if he will be able to take FMLA so he can be a primary caregiver for the patient during recovery. At this time Boneta Lucks stated she wanted to proceed with a SNF placement as a back up plan.   Assessment/plan status:  Information/Referral to [facility]  PATIENT'S/FAMILY'S RESPONSE TO PLAN OF CARE: Patient's sister is agreeable and thanked CSW for facilitating SNF placement.    Lia Foyer, LCSWA [facility] Clinical Social Worker Contact #: [phone] (weekend)

## 2011-11-04 NOTE — Progress Notes (Addendum)
Clinical Social Work Department CLINICAL SOCIAL WORK PLACEMENT NOTE 11/04/2011  Patient:  Anthony Hardin, Anthony Hardin  Account Number:  [id number] Admit date:  10/30/2011  Clinical Social Worker: Ronnie Doss, [place]  Date/time:  11/04/2011 12:01 PM  Clinical Social Work is seeking post-discharge placement for this patient at the following level of care:   SKILLED NURSING   (*CSW will update this form in Epic as items are completed)   11/04/2011  Patient/family provided with [facility] Department of Clinical Social Work's list of facilities offering this level of care within the geographic area requested by the patient (or if unable, by the patient's family).  11/04/2011  Patient/family informed of their freedom to choose among providers that offer the needed level of care, that participate in Medicare, Medicaid or managed care program needed by the patient, have an available bed and are willing to accept the patient.  11/04/2011  Patient/family informed of [facility]' ownership interest in [facility], as well as of the fact that they are under no obligation to receive care at this facility.  11/04/2011 PASARR submitted to EDS on  11/04/2011 PASARR number received from EDS on   11/05/2011 FL2 transmitted to all facilities in geographic area requested by pt/family on   FL2 transmitted to all facilities within larger geographic area on   Patient informed that his/her managed care company has contracts with or will negotiate with  certain facilities, including the following:     Patient/family informed of bed offers received:   Patient chooses bed at  Physician recommends and patient chooses bed at    Patient to be transferred to  on   Patient to be transferred to facility by   The following physician request were entered in Epic:   Additional Comments:  Lia Foyer, LCSWA [facility] Clinical Social Worker Contact #: [phone] (weekend)

## 2011-11-05 MED ORDER — ENSURE COMPLETE PO LIQD
237.0000 mL | Freq: Two times a day (BID) | ORAL | Status: DC
  Administered 2011-11-05 – 2011-11-06 (×3): 237 mL via ORAL

## 2011-11-05 NOTE — Progress Notes (Signed)
Stroke Team Progress Note  HISTORY Anthony Hardin is an 61 y.o. male who was last seen normal on 8/16 after work. Patient reports that he began to feel numbness and weakness on his left side on Saturday. Reports that he was unable to move and in attempting to move fell out of the bed and onto the floor. Was unable to get off the floor and was found by co-workers 08/19. EMS was called and patient was brought in for evaluation. 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  Surrounding edema and mild mass effect without midline shift.   SUBJECTIVE Patient more alert this am.  Maintained conversation with practitioner, c/o 'multiple vision' when both eyes open.  No pain. Sister and nephew at bedside.  Cardene gtt off.  Drinking fluids readily, minimal solid po intake.  OBJECTIVE Most recent Vital Signs: Filed Vitals:   11/05/11 0500 11/05/11 0600 11/05/11 0700 11/05/11 0800  BP: 185/87 172/67 160/84 146/59  Pulse: 70 84 89 61  Temp:    98.9 F (37.2 C)  TempSrc:    Oral  Resp: 18 16 16 17   Height:      Weight:      SpO2: 95% 95% 98% 100%   Intake/Output from previous day: 08/24 0701 - 08/25 0700 In: 1791.3 [P.O.:1160; I.V.:631.3] Out: 2825 [Urine:2825]  IV Fluid Intake: NS   MEDICATIONS     . amLODipine  10 mg Oral Daily  . antiseptic oral rinse  15 mL Mouth Rinse BID  . cloNIDine  0.1 mg Transdermal Weekly  . enoxaparin (LOVENOX) injection  40 mg Subcutaneous Q24H  . hydrochlorothiazide  25 mg Oral Daily  . lisinopril  10 mg Oral Daily  . pantoprazole  20 mg Oral Q1200  . senna-docusate  1 tablet Oral BID   PRN:  acetaminophen, acetaminophen, hydrALAZINE, labetalol, ondansetron (ZOFRAN) IV  Diet:  DYS 3 THIN liquids Activity:  Ambulate  DVT Prophylaxis:  SCD  CLINICALLY SIGNIFICANT STUDIES Basic Metabolic Panel:   Lab 11/04/11 0422 11/03/11 1642  NA 140 138  K 3.6 2.8*  CL 102 100  CO2 23 26  GLUCOSE  127* 131*  BUN 16 14  CREATININE 0.67 0.74  CALCIUM 9.0 9.3  MG -- --  PHOS -- --   Liver Function Tests:   Lab 10/30/11 2255  AST 46*  ALT 28  ALKPHOS 55  BILITOT 1.5*  PROT 8.0  ALBUMIN 4.6   CBC:   Lab 11/02/11 1057 10/30/11 2255  WBC 14.5* 14.6*  NEUTROABS -- 11.6*  HGB 13.6 15.8  HCT 37.8* 43.4  MCV 86.5 86.6  PLT 229 200   Coagulation:   Lab 10/30/11 2255  LABPROT 13.6  INR 1.02   Cardiac Enzymes:   Lab 11/02/11 1057 10/30/11 2301 10/30/11 2255  CKTOTAL 349* -- 1299*  CKMB -- -- --  CKMBINDEX -- -- --  TROPONINI -- <0.30 --   Urinalysis negatvie  Urine Drug Screen:  normal  CT of the brain 11/01/2011   Right thalamic hemorrhage with ventricular extension.  No significant change from 10/30/2011.   10/30/2011   Acute intraparenchymal hemorrhage in the right thalamus extending to the right lateral ventricle.  Surrounding edema and mild mass effect without midline shift.   MRI of the brain 11/01/2011  4.1 x 2.5 cm acute hematoma with the epicenter in the right thalamus as outlined above.  Surrounding edema.  Mass effect with displacement distortion of the third ventricle  but no suspicion of obstructive hydrocephalus at this moment.  Small amount of intraventricular blood.   MRA of the brain 11/01/2011   No evidence of high flow vascular malformation.  No large or medium vessel occlusion, correctable stenosis or aneurysm.     CXR 10/31/2011   No acute abnormality.  EKG  NSR.   EEG 11/01/2011 no seziure  Therapy Recommendations PT - CIR; OT -CIR ; ST - CIR.  Physical Exam   Pleasant middle aged Caucasian male not in distress. Afebrile. Head is nontraumatic. Neck is supple without bruit. Hearing is normal. Cardiac exam no murmur or gallop. Lungs are clear to auscultation.  Neurological Exam :  Mental Status: Drowsy, will maintain alertness and attention for the majority of exam.  Oriented x 3, thought content appropriate, insight reasonable.  Speech fluent  with Frequent circumlocution and minimal dysarthria.  Able to follow 3 step commands without difficulty.  Left neglect Cranial Nerves: II-partial left homonymous hemianopsia; c/o multiple vision with both eyes open -side-by side III/IV/VI-Extraocular movements intact.  Pupils reactive bilaterally, left pupil slightly larger than right.. V/VII-lower left facial droop VIII-grossly intact IX/X-not assessed XI-unable to shrug left shoulder XII-midline tongue extension Motor: 5/5 RUE/RLE, 0/5 RUE/RLE.  No grip on left. Increased tone LUE/LLE Sensory: no response to noxious stim on left.  Deep Tendon Reflexes: 2+ and symmetric throughout Plantars:upgoing left, downgoing right. bilaterally Cerebellar: not tested.  ASSESSMENT [name] is a 61 y.o. 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 Now off cardene gtt.   Patient with resultant left hemiparesis and lethargy. Therapies are recommending inpatient rehabilitation.  -lethargy continues, however, periods of lucidity increasing -Neurosurgery consulted for possible ventriculostomy but felt not to be a candidate. -Malignant hypertension. Off cardene. Started PO medications 8/21.  But limited po intake due to sleepiness. -rhabdomyolysis, found down. CK high on admission -leukocytosis  Hospital day # 6  TREATMENT/PLAN -inpatient rehabilitation following for possible inpatient rehabilitation stay, SW following for possible SNF placement -recheck potassium. Replace as needed  -He is on norvasc 10mg  qday, HCTZ 25mg  qday, add on Lisinopril 10mg  qam. -continue Lovenox daily for DVT prophylaxis. -Nutrition consult -Saline lock IV -recheck CBC -Transfer to floor tomorrow if BP remains controlled off Cardene. -Long discussion with sister about prognosis, plan of care and answered questions.  This  patient is critically ill and at significant risk of neurological worsening, death and care requires constant monitoring of vital signs, hemodynamics,respiratory and cardiac monitoring,review of multiple databases, neurological assessment, discussion with family, other specialists and medical decision making of high complexity.  Marya Fossa PA-C [facility] [phone] 11/05/11

## 2011-11-05 NOTE — Progress Notes (Signed)
Was called due to assymetric pupils on nurse's exam  Patient is awake, alert and conversant.  Versions are conjugate, left pupil is approximately 1 mm larger than right.   CT head appears stable, but if the patient has any other changes in his exam or mental status, then I would consider starting hyperosmolar therapy.

## 2011-11-05 NOTE — Progress Notes (Signed)
I have examined patient, talked with his sister, he is doing better today, more alert, carden drip was weaned off, SBP 140s.  1. PT/OT. 2. Likely long term rehab facility.

## 2011-11-06 LAB — CBC WITH DIFFERENTIAL/PLATELET
Basophils Absolute: 0 K/uL (ref 0.0–0.1)
Basophils Relative: 0 % (ref 0–1)
Eosinophils Absolute: 0.2 K/uL (ref 0.0–0.7)
Eosinophils Relative: 2 % (ref 0–5)
HCT: 38.1 % — ABNORMAL LOW (ref 39.0–52.0)
Hemoglobin: 13.7 g/dL (ref 13.0–17.0)
Lymphocytes Relative: 15 % (ref 12–46)
Lymphs Abs: 1.5 K/uL (ref 0.7–4.0)
MCH: 30.6 pg (ref 26.0–34.0)
MCHC: 36 g/dL (ref 30.0–36.0)
MCV: 85.2 fL (ref 78.0–100.0)
Monocytes Absolute: 1.2 K/uL — ABNORMAL HIGH (ref 0.1–1.0)
Monocytes Relative: 12 % (ref 3–12)
Neutro Abs: 6.9 K/uL (ref 1.7–7.7)
Neutrophils Relative %: 71 % (ref 43–77)
Platelets: 274 K/uL (ref 150–400)
RBC: 4.47 MIL/uL (ref 4.22–5.81)
RDW: 12.6 % (ref 11.5–15.5)
WBC: 9.8 K/uL (ref 4.0–10.5)

## 2011-11-06 LAB — LIPID PANEL
Cholesterol: 181 mg/dL (ref 0–200)
Total CHOL/HDL Ratio: 3.9 RATIO
Triglycerides: 102 mg/dL (ref <150)
VLDL: 20 mg/dL (ref 0–40)

## 2011-11-06 LAB — HEMOGLOBIN A1C: Mean Plasma Glucose: 105 mg/dL (ref <117)

## 2011-11-06 MED ORDER — HYDRALAZINE HCL 20 MG/ML IJ SOLN: 25.0000 mg | Freq: Four times a day (QID) | INTRAMUSCULAR | Status: DC | PRN

## 2011-11-06 MED ORDER — LABETALOL HCL 5 MG/ML IV SOLN: 10.0000 mg | INTRAVENOUS | Status: DC | PRN

## 2011-11-06 MED ORDER — LISINOPRIL 20 MG PO TABS
20.0000 mg | ORAL_TABLET | Freq: Every day | ORAL | Status: DC
Start: 2011-11-07 — End: 2011-11-14
  Administered 2011-11-07 – 2011-11-14 (×8): 20 mg via ORAL
  Filled 2011-11-06 (×8): qty 1

## 2011-11-06 MED ORDER — HYDRALAZINE HCL 20 MG/ML IJ SOLN
20.0000 mg | Freq: Four times a day (QID) | INTRAMUSCULAR | Status: DC | PRN
  Administered 2011-11-08 – 2011-11-10 (×2): 20 mg via INTRAVENOUS
  Filled 2011-11-06 (×2): qty 1

## 2011-11-06 MED ORDER — ENSURE PUDDING PO PUDG
1.0000 | Freq: Three times a day (TID) | ORAL | Status: DC
  Administered 2011-11-06 – 2011-11-10 (×8): 1 via ORAL

## 2011-11-06 MED ORDER — ZOLPIDEM TARTRATE 5 MG PO TABS
5.0000 mg | ORAL_TABLET | Freq: Every evening | ORAL | Status: DC | PRN
  Administered 2011-11-11 – 2011-11-13 (×3): 5 mg via ORAL
  Filled 2011-11-06 (×3): qty 1

## 2011-11-06 MED ORDER — LISINOPRIL 10 MG PO TABS
10.0000 mg | ORAL_TABLET | Freq: Once | ORAL | Status: AC
  Administered 2011-11-06: 10 mg via ORAL
  Filled 2011-11-06: qty 1

## 2011-11-06 MED ORDER — ENSURE COMPLETE PO LIQD
237.0000 mL | Freq: Three times a day (TID) | ORAL | Status: DC
  Administered 2011-11-06 – 2011-11-10 (×5): 237 mL via ORAL

## 2011-11-06 NOTE — Progress Notes (Signed)
Occupational Therapy Treatment Patient Details Name: Anthony Hardin MRN: [id number] DOB: Jun 13, 1950 Today's Date: 11/06/2011 Time: 4098-1191 OT Time Calculation (min): 28 min  OT Assessment / Plan / Recommendation Comments on Treatment Session Pt demonstrates continued delayed response however pt now x3 session orthostatic BP. Pt could be fatigued due to BP changes with therapy. Recommend ted hose or ace wraps next session to see if it allows increased participation.     Follow Up Recommendations  Inpatient Rehab    Barriers to Discharge       Equipment Recommendations  Defer to next venue    Recommendations for Other Services Rehab consult  Frequency Min 2X/week   Plan Discharge plan remains appropriate    Precautions / Restrictions Precautions Precautions: Fall Restrictions Weight Bearing Restrictions: No   Pertinent Vitals/Pain 118/56 orthostatic Pt tracking therapist Rt to LT and then closing eyes Pt continues to report diplopia Pt should wear eye patch during activity. Pt reports 1 pen with lt eye closed and two pens with bil eyes open    ADL  Grooming: Performed;Wash/dry face;Maximal assistance (pt picking nose and declining a tissue) Where Assessed - Grooming: Supine, head of bed up (attempting to put finger in mouth) Toilet Transfer: Simulated;+2 Total assistance Toilet Transfer: Patient Percentage: 30% Toilet Transfer Method: Stand pivot Toilet Transfer Equipment: Raised toilet seat with arms (or 3-in-1 over toilet) (EOB <>chair) Toileting - Clothing Manipulation and Hygiene: Performed;+2 Total assistance Toileting - Clothing Manipulation and Hygiene: Patient Percentage: 10% Where Assessed - Toileting Clothing Manipulation and Hygiene: Sit to stand from 3-in-1 or toilet (RN (A)ing with hygiene due to pt required total +2 for stand) Equipment Used: Gait belt Transfers/Ambulation Related to ADLs: not appropriate at this time ADL Comments: Pt supine on arrival and  required name call with delayed response time of >30 seconds to open eyes. Pt began to pick nose with OT providing verbal cue to use tissue. Pt then attempting to place hand in mouth with Max (A) to correct. Pt lacks awareness of deficits and demonstrates impulsive behavior. Pt incontinent and unaware. Pt (A)ed to EOB for hygiene standing so pt's bed could be cleaned. Pt positioned in chair. Pt at beginning of session with BP 150's / 90's and at end of session 118/56. ORTHOSTATIC    OT Diagnosis:    OT Problem List:   OT Treatment Interventions:     OT Goals Acute Rehab OT Goals OT Goal Formulation: With patient Time For Goal Achievement: 11/14/11 Potential to Achieve Goals: Good ADL Goals Pt Will Perform Grooming: with min assist;Supported;Sitting, edge of bed ADL Goal: Grooming - Progress: Not progressing Miscellaneous OT Goals Miscellaneous OT Goal #1: Pt will perform bed mobility at Min (A) level as precursor to adls OT Goal: Miscellaneous Goal #1 - Progress: Not progressing Miscellaneous OT Goal #2: Pt will located 2 out 4 objects (50%) on Lt side with only verbal request to decrease Lt neglect OT Goal: Miscellaneous Goal #2 - Progress: Progressing toward goals Miscellaneous OT Goal #3: Pt will tolerate eye patch schedule to decrease diplopia as precursor to adls OT Goal: Miscellaneous Goal #3 - Progress: Progressing toward goals  Visit Information  Last OT Received On: 11/06/11 Assistance Needed: +2 PT/OT Co-Evaluation/Treatment: Yes    Subjective Data      Prior Functioning       Cognition  Overall Cognitive Status: Appears within functional limits for tasks assessed/performed Arousal/Alertness: Lethargic Orientation Level: Appears intact for tasks assessed Behavior During Session: Lethargic  Mobility Bed Mobility Rolling Right: 2: Max assist Right Sidelying to Sit: 3: Mod assist Sitting - Scoot to Edge of Bed: 2: Max assist Details for Bed Mobility Assistance: Pt  required facilitation to log roll and initiate action. Pt with delayed responses. Pt activating with PT (A)ing Rt LE. Transfers Sit to Stand: 1: +2 Total assist;From bed Sit to Stand: Patient Percentage: 50% Stand to Sit: 1: +2 Total assist;To bed Stand to Sit: Patient Percentage: 50% Details for Transfer Assistance: Pt demonstrates weight shifting to the Rt side with facilitation by therapist. Pt with weight mainly on Rt LE. pt closing eyes. Pt could possibly benefit from ted hose or ace wraps for mobility to (A) orthostatic BP during activity.   Exercises    Balance Balance Balance Assessed: Yes Static Sitting Balance Static Sitting - Balance Support: Right upper extremity supported;Feet supported Static Sitting - Level of Assistance: 2: Max assist Static Sitting - Comment/# of Minutes: Pt attempting to return to supine on right side and posterior lean Static Standing Balance Static Standing - Balance Support: Bilateral upper extremity supported;During functional activity Static Standing - Level of Assistance: 3: Mod assist  End of Session OT - End of Session Activity Tolerance: Patient limited by fatigue Patient left: in chair;with call bell/phone within reach;with nursing in room Nurse Communication: Precautions  GO     Lucile Shutters 11/06/2011, 1:42 PM Pager: [phone]

## 2011-11-06 NOTE — Progress Notes (Signed)
Received patient from 3100 via wheelchair, assisted into bed, patient rambling about variety of subjects, no complaints voiced at this time.

## 2011-11-06 NOTE — Progress Notes (Signed)
Clinical Social Worker received notification from RN that pt sister requesting to speak to this Clinical Child psychotherapist. Clinical Social Worker met with pt sister in waiting room. Pt sister discussed that pt does have insurance and provided pt insurance card. Clinical Social Worker discussed that SNF search will have to be re-initiated with insurance information included as initially the system has pt listed as a self pay. Clinical Social Worker clarified pt sister's questions in regard to SNF placement to pt sisters satisfaction. Pt sister discussed that she is interested in having document notarized from pt bank in order for pt sister to assist with pt financials. Clinical Social Worker discussed that though notary services are available in the hospital, we do not notarize financial documents and advised pt sister of way to access notary in the community. Pt sister expressed understanding. Clinical Social Worker re-initiated SNF search to [facility] with updated insurance information and spoke to financial counselor who confirmed that pt insurance information had been updated in the system. Clinical Social Worker to follow up with pt sister in regard to bed offers. Clinical Social Worker to continue to follow and facilitate pt discharge needs when pt medically ready for discharge.  Jacklynn Lewis, MSW, LCSWA  Clinical Social Work [phone]

## 2011-11-06 NOTE — Progress Notes (Signed)
Speech Language Pathology Treatment Patient Details Name: Anthony Hardin MRN: [id number] DOB: 1950/08/24 Today's Date: 11/06/2011 Time:  -    Attempted cognitive and dysphagia therapy with pt.iIn recliner after PT session.  RN reported he was awake most of the night.  SLP was unable to arouse with max tactile and verbal cues.  Will reattempt next date.        [name] [place].Ed [employer] [phone]  11/06/2011

## 2011-11-06 NOTE — Progress Notes (Signed)
INITIAL ADULT NUTRITION ASSESSMENT Date: 11/06/2011   Time: 10:39 AM  Reason for Assessment: MD Consult for poor PO intake  INTERVENTION:  Increase Ensure Complete to TID, each supplement provides 350 kcal and 13 grams of protein.  Ensure Pudding po TID, each supplement provides 170 kcal and 4 grams of protein.    DOCUMENTATION CODES Per approved criteria  -Not Applicable    ASSESSMENT: Male 61 y.o.  Dx: Intracerebral hemorrhage  Hx:  Past Medical History  Diagnosis Date  . Hypertension    History reviewed. No pertinent past surgical history.  Related Meds:  Scheduled Meds:   . amLODipine  10 mg Oral Daily  . antiseptic oral rinse  15 mL Mouth Rinse BID  . cloNIDine  0.1 mg Transdermal Weekly  . enoxaparin (LOVENOX) injection  40 mg Subcutaneous Q24H  . feeding supplement  237 mL Oral BID BM  . hydrochlorothiazide  25 mg Oral Daily  . lisinopril  10 mg Oral Daily  . pantoprazole  20 mg Oral Q1200  . senna-docusate  1 tablet Oral BID   Continuous Infusions:  PRN Meds:.acetaminophen, acetaminophen, hydrALAZINE, labetalol, ondansetron (ZOFRAN) IV   Ht: 5\' 9"  (175.3 cm)  Wt: 171 lb 11.8 oz (77.9 kg)  Ideal Wt: 72.7 kg % Ideal Wt: 107%  Wt Readings from Last 10 Encounters:  11/05/11 171 lb 11.8 oz (77.9 kg)    Usual Wt: ~171 lb % Usual Wt: 100%  Body mass index is 25.36 kg/(m^2).  Food/Nutrition Related Hx: No nutrition problems identified on admission nutrition screen.   Labs:  CMP     Component Value Date/Time   NA 140 11/04/2011 0422   K 3.6 11/04/2011 0422   CL 102 11/04/2011 0422   CO2 23 11/04/2011 0422   GLUCOSE 127* 11/04/2011 0422   BUN 16 11/04/2011 0422   CREATININE 0.67 11/04/2011 0422   CALCIUM 9.0 11/04/2011 0422   PROT 8.0 10/30/2011 2255   ALBUMIN 4.6 10/30/2011 2255   AST 46* 10/30/2011 2255   ALT 28 10/30/2011 2255   ALKPHOS 55 10/30/2011 2255   BILITOT 1.5* 10/30/2011 2255   GFRNONAA >90 11/04/2011 0422   GFRAA >90 11/04/2011 0422     CBG (last 3)  No results found for this basename: GLUCAP:3 in the last 72 hours    Intake/Output Summary (Last 24 hours) at 11/06/11 1042 Last data filed at 11/05/11 2000  Gross per 24 hour  Intake   1500 ml  Output   1300 ml  Net    200 ml     Diet Order: Dysphagia 3 with thin liquids  Supplements/Tube Feeding: Ensure Complete PO BID  IVF:  none  Estimated Nutritional Needs:   Kcal: 1900-2000 Protein: 90-105 grams Fluid: 1.9-2 liters  Patient with decreased oral intake due to decreased alertness.  Likes Ensure Complete supplement.  Takes pills with applesauce per RN.  Also takes pills whole sometimes.  NUTRITION DIAGNOSIS: -Inadequate oral intake (NI-2.1).  Status: Ongoing  RELATED TO: decreased alertness  AS EVIDENCED BY: 0-10% meal completion  MONITORING/EVALUATION(Goals): Goal:  Intake to meet >90% of estimated nutrition needs. Monitor:  PO intake, labs, weight trend.  EDUCATION NEEDS: -Education not appropriate at this time   Joaquin Courts, RD, CNSC, [place] Pager# [phone] After Hours Pager# [phone]  11/06/2011, 10:39 AM

## 2011-11-06 NOTE — Progress Notes (Signed)
Physical Therapy Treatment Patient Details Name: Anthony Hardin MRN: [id number] DOB: 01/24/1951 Today's Date: 11/06/2011 Time: 8295-6213 PT Time Calculation (min): 28 min  PT Assessment / Plan / Recommendation Comments on Treatment Session  Pt able to transfer to recliner today and complete standing balance.  Pt slightly more alert this session.  Continues to have delay to response to commands and needs extra time to complete task.    Follow Up Recommendations  Inpatient Rehab    Barriers to Discharge        Equipment Recommendations  Defer to next venue    Recommendations for Other Services Rehab consult  Frequency Min 4X/week   Plan Discharge plan remains appropriate    Precautions / Restrictions Precautions Precautions: Fall Restrictions Weight Bearing Restrictions: No   Pertinent Vitals/Pain No c/o pain    Mobility  Bed Mobility Bed Mobility: Supine to Sit;Sitting - Scoot to [place] of Bed;Sit to Supine Rolling Right: 2: Max assist Right Sidelying to Sit: 3: Mod assist Sitting - Scoot to Edge of Bed: 2: Max assist Details for Bed Mobility Assistance: Pt required facilitation to log roll and initiate action. Pt with delayed responses. Pt activating with PT (A)ing Rt LE. Transfers Transfers: Sit to Stand;Stand to Sit Sit to Stand: 1: +2 Total assist;From bed Sit to Stand: Patient Percentage: 50% Stand to Sit: 1: +2 Total assist;To bed Stand to Sit: Patient Percentage: 50% Details for Transfer Assistance: Pt demonstrates weight shifting to the Rt side with facilitation by therapist. Pt with weight mainly on Rt LE. pt closing eyes. Pt could possibly benefit from ted hose or ace wraps for mobility to (A) orthostatic BP during activity.    Exercises     PT Diagnosis:    PT Problem List:   PT Treatment Interventions:     PT Goals Acute Rehab PT Goals PT Goal Formulation: With patient Time For Goal Achievement: 11/14/11 Potential to Achieve Goals: Good Pt will Roll  Supine to Right Side: with min assist PT Goal: Rolling Supine to Right Side - Progress: Progressing toward goal Pt will Roll Supine to Left Side: with supervision PT Goal: Rolling Supine to Left Side - Progress: Progressing toward goal Pt will go Supine/Side to Sit: with supervision PT Goal: Supine/Side to Sit - Progress: Progressing toward goal Pt will go Sit to Stand: with min assist PT Goal: Sit to Stand - Progress: Progressing toward goal Pt will Transfer Bed to Chair/Chair to Bed: with min assist PT Transfer Goal: Bed to Chair/Chair to Bed - Progress: Progressing toward goal  Visit Information  Last PT Received On: 11/06/11 Assistance Needed: +2 PT/OT Co-Evaluation/Treatment: Yes    Subjective Data      Cognition  Overall Cognitive Status: Appears within functional limits for tasks assessed/performed Arousal/Alertness: Lethargic Orientation Level: Appears intact for tasks assessed Behavior During Session: Lethargic    Balance  Balance Balance Assessed: Yes Static Sitting Balance Static Sitting - Balance Support: Right upper extremity supported;Feet supported Static Sitting - Level of Assistance: 2: Max assist Static Sitting - Comment/# of Minutes:   Pt attempting to return to supine on right side and posterior lean  Static Standing Balance Static Standing - Balance Support: Bilateral upper extremity supported;During functional activity Static Standing - Level of Assistance: 3: Mod assist Static Standing - Comment/# of Minutes: ~2 minutes while performing weight shifts to promote pregait with manual cues to prevent left knee buckling with cues to promote upright posture.  End of Session PT - End of Session  Equipment Utilized During Treatment: Gait belt Activity Tolerance: Patient tolerated treatment well Patient left: in chair;with call bell/phone within reach;with nursing in room Nurse Communication: Mobility status   GP     Raynesha Tiedt 11/06/2011, 2:18 PM Jake Shark, PT DPT [phone]

## 2011-11-06 NOTE — Plan of Care (Signed)
Problem: Phase II Progression Outcomes Goal: Tolerating diet/TF at goal rate Outcome: Progressing Ensure added to diet

## 2011-11-06 NOTE — Progress Notes (Signed)
Stroke Team Progress Note  HISTORY Anthony Hardin is an 61 y.o. male who was last seen normal on 8/16 after work. Patient reports that he began to feel numbness and weakness on his left side on Saturday. Reports that he was unable to move and in attempting to move fell out of the bed and onto the floor. Was unable to get off the floor and was found by co-workers 08/19. EMS was called and patient was brought in for evaluation. 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  Surrounding edema and mild mass effect without midline shift.   SUBJECTIVE His sister is at the bedside. Complains of low level headaches.mental status continues to fluctuate but he was up most of the night and had slept during the day.  OBJECTIVE Most recent Vital Signs: Filed Vitals:   11/06/11 0515 11/06/11 0525 11/06/11 0600 11/06/11 0700  BP: 183/91 152/86 150/69 150/66  Pulse: 66 56 54 54  Temp:      TempSrc:      Resp: 15 15 16 15   Height:      Weight:      SpO2: 96% 94% 94% 93%   Intake/Output from previous day: 08/25 0701 - 08/26 0700 In: 1980 [P.O.:1960; I.V.:20] Out: 1300 [Urine:1300]  IV Fluid Intake:    . DISCONTD: sodium chloride 10 mL/hr at 11/05/11 0700  . DISCONTD: niCARDipine Stopped (11/04/11 1250)   MEDICATIONS    . amLODipine  10 mg Oral Daily  . antiseptic oral rinse  15 mL Mouth Rinse BID  . cloNIDine  0.1 mg Transdermal Weekly  . enoxaparin (LOVENOX) injection  40 mg Subcutaneous Q24H  . feeding supplement  237 mL Oral BID BM  . hydrochlorothiazide  25 mg Oral Daily  . lisinopril  10 mg Oral Daily  . pantoprazole  20 mg Oral Q1200  . senna-docusate  1 tablet Oral BID   PRN:  acetaminophen, acetaminophen, hydrALAZINE, labetalol, ondansetron (ZOFRAN) IV  Diet:  DYS 3 THIN liquids Activity:  Ambulate  DVT Prophylaxis:  Lovenox 40 mg sq daily   CLINICALLY SIGNIFICANT STUDIES Basic Metabolic Panel:   Lab 11/04/11  0422 11/03/11 1642  NA 140 138  K 3.6 2.8*  CL 102 100  CO2 23 26  GLUCOSE 127* 131*  BUN 16 14  CREATININE 0.67 0.74  CALCIUM 9.0 9.3  MG -- --  PHOS -- --   Liver Function Tests:   Lab 10/30/11 2255  AST 46*  ALT 28  ALKPHOS 55  BILITOT 1.5*  PROT 8.0  ALBUMIN 4.6   CBC:   Lab 11/06/11 0400 11/02/11 1057 10/30/11 2255  WBC 9.8 14.5* --  NEUTROABS 6.9 -- 11.6*  HGB 13.7 13.6 --  HCT 38.1* 37.8* --  MCV 85.2 86.5 --  PLT 274 229 --   Coagulation:   Lab 10/30/11 2255  LABPROT 13.6  INR 1.02   Cardiac Enzymes:   Lab 11/02/11 1057 10/30/11 2301 10/30/11 2255  CKTOTAL 349* -- 1299*  CKMB -- -- --  CKMBINDEX -- -- --  TROPONINI -- <0.30 --   Urinalysis negatvie  Urine Drug Screen:  normal  CT of the brain 11/04/2011 Stable large hemorrhage involving right thalamus with stable surrounding vasogenic edema. Stable to minimally more prominent mass effect on the third ventricle, with minimal increased dilatation of the third ventricle and temporal horns of the lateral ventricles. Stable bifrontal subarachnoid hemorrhage. 11/03/2011 No significant change seen involving large intraparenchymal hemorrhage  in the right thalamus compared to prior exam. There does appear to be a decreased amount of hemorrhage within the right lateral ventricle compared to prior exam. No change is seen involving small amount of hemorrhage in right occipital horn or small amount of subarachnoid hemorrhage noted previously 11/01/2011   Right thalamic hemorrhage with ventricular extension.  No significant change from 10/30/2011.   10/30/2011   Acute intraparenchymal hemorrhage in the right thalamus extending to the right lateral ventricle.  Surrounding edema and mild mass effect without midline shift.   MRI of the brain 11/01/2011  4.1 x 2.5 cm acute hematoma with the epicenter in the right thalamus as outlined above.  Surrounding edema.  Mass effect with displacement distortion of the third ventricle  but no suspicion of obstructive hydrocephalus at this moment.  Small amount of intraventricular blood.   MRA of the brain 11/01/2011   No evidence of high flow vascular malformation.  No large or medium vessel occlusion, correctable stenosis or aneurysm.     CXR  11/03/2011 Low volume film with mild cardiomegaly. No acute cardiopulmonary  finding. 10/31/2011   No acute abnormality.  EKG  NSR.   EEG 11/01/2011 no seziure  Therapy Recommendations PT - CIR; OT -CIR ; ST - CIR.  Physical Exam   Pleasant middle aged Caucasian male not in distress. Afebrile. Head is nontraumatic. Neck is supple without bruit. Hearing is normal. Cardiac exam no murmur or gallop. Lungs are clear to auscultation.  Neurological Exam :  Mental Status: Drowsy but will arouse easily and will maintain alertness and attention for the majority of exam.  Oriented x 3, thought content appropriate, insight reasonable.  Speech fluent with Frequent circumlocution and minimal dysarthria.  Able to follow 3 step commands without difficulty.  Left neglect Cranial Nerves: II-partial left homonymous hemianopsia; c/o multiple vision with both eyes open -side-by side III/IV/VI-Extraocular movements intact.  Pupils reactive bilaterally, left pupil slightly larger than right.. V/VII-lower left facial droop VIII-grossly intact IX/X-not assessed XI-unable to shrug left shoulder XII-midline tongue extension Motor: 5/5 RUE/RLE, 0/5 RUE/RLE.  No grip on left. Increased tone LUE/LLE Sensory: no response to noxious stim on left.  Deep Tendon Reflexes: 2+ and symmetric throughout Plantars:upgoing left, downgoing right. bilaterally Cerebellar: not tested.  ASSESSMENT [name] is a 61 y.o. 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 Now off cardene gtt.   Patient with resultant left hemiparesis and  lethargy. Therapies are recommending inpatient rehabilitation.  -lethargy much improved though not resolved. Waxes and wanes. -Neurosurgery consulted for possible ventriculostomy but felt not to be a candidate. Stable from NS standpoinndt. -Malignant hypertension. Off cardene. Started PO medications 8/21.  But limited po intake due to sleepiness. He is on norvasc 10mg  qday, HCTZ 25mg  qday, clonidine 0.1 mg patch and  Lisinopril 10mg  qam. -rhabdomyolysis, found down. CK high on admission. Resolved. -leukocytosis, resolved -hypokalemia, replaced  Hospital day # 7  TREATMENT/PLAN -inpatient rehabilitation following for possible inpatient rehabilitation stay. Sister willing to take him with her to [place], though she does not think he would be willing to go. He does not have 24h care here. SW following for possible SNF placement in [place] -recheck potassium in am. Replace as needed  -attempt eye patch again for double vision -increase lisinopril -Transfer to floor -Long discussion with sister about disposition and plan of care. This patient is critically ill and at  significant risk of neurological worsening, death and care requires constant monitoring of vital signs, hemodynamics,respiratory and cardiac monitoring,review of multiple databases, neurological assessment, discussion with family, other specialists and medical decision making of high complexity. I spent 30 minutes of neurocritical care time  in the care of  this patient.  Annie Main, MSN, RN, ANVP-BC, ANP-BC, [facility] Pager: [phone] 11/06/2011 8:09 AM  Scribe for Dr. Delia Heady, Stroke Center Medical Director, who has personally reviewed chart, pertinent data, examined the patient and developed the plan of care. Pager:  [phone]

## 2011-11-06 NOTE — Progress Notes (Signed)
Rehab admissions - Patient making very minimal progress.  He is lethargic today, sleeping in chair.  I spoke with sister again today.  She has decided to pursue SNF as patient has no available caregiver support.  Patient does have state [employer].  Sister has his card and plans to take it to admitting to update information.  Call me for questions.  #[phone]

## 2011-11-07 LAB — BASIC METABOLIC PANEL
BUN: 15 mg/dL (ref 6–23)
Calcium: 10.1 mg/dL (ref 8.4–10.5)
Creatinine, Ser: 0.95 mg/dL (ref 0.50–1.35)
GFR calc Af Amer: >90 mL/min (ref >90)
GFR calc non Af Amer: 89 mL/min — ABNORMAL LOW (ref >90)

## 2011-11-07 NOTE — Progress Notes (Addendum)
Stroke Team Progress Note  HISTORY Anthony Hardin is an 61 y.o. male who was last seen normal on 8/16 after work. Patient reports that he began to feel numbness and weakness on his left side on Saturday. Reports that he was unable to move and in attempting to move fell out of the bed and onto the floor. Was unable to get off the floor and was found by co-workers 08/19. EMS was called and patient was brought in for evaluation. 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  Surrounding edema and mild mass effect without midline shift.   SUBJECTIVE His sister is at the bedside. Seems better to her today. They are looking at [facility].  OBJECTIVE Most recent Vital Signs: Filed Vitals:   11/06/11 1500 11/06/11 1757 11/06/11 2308 11/07/11 0314  BP: 145/71 167/83 186/96 155/63  Pulse: 56 63 68 51  Temp:  99.6 F (37.6 C) 97.8 F (36.6 C) 98.2 F (36.8 C)  TempSrc:  Oral Oral Axillary  Resp: 17 18 18 16   Height:      Weight:      SpO2: 95% 97% 97% 97%   Intake/Output from previous day: 08/26 0701 - 08/27 0700 In: 400 [P.O.:400] Out: 350 [Urine:350]  IV Fluid Intake:   MEDICATIONS     . amLODipine  10 mg Oral Daily  . antiseptic oral rinse  15 mL Mouth Rinse BID  . cloNIDine  0.1 mg Transdermal Weekly  . enoxaparin (LOVENOX) injection  40 mg Subcutaneous Q24H  . feeding supplement  237 mL Oral TID BM  . feeding supplement  1 Container Oral TID BM  . hydrochlorothiazide  25 mg Oral Daily  . lisinopril  10 mg Oral Once  . lisinopril  20 mg Oral Daily  . pantoprazole  20 mg Oral Q1200  . senna-docusate  1 tablet Oral BID  . DISCONTD: feeding supplement  237 mL Oral BID BM  . DISCONTD: lisinopril  10 mg Oral Daily   PRN:  acetaminophen, hydrALAZINE, labetalol, ondansetron (ZOFRAN) IV, zolpidem, DISCONTD: acetaminophen, DISCONTD: hydrALAZINE, DISCONTD: hydrALAZINE, DISCONTD: labetalol  Diet:  DYS 3 THIN  liquids Activity:  Ambulate  DVT Prophylaxis:  Lovenox 40 mg sq daily   CLINICALLY SIGNIFICANT STUDIES Basic Metabolic Panel:   Lab 11/04/11 0422 11/03/11 1642  NA 140 138  K 3.6 2.8*  CL 102 100  CO2 23 26  GLUCOSE 127* 131*  BUN 16 14  CREATININE 0.67 0.74  CALCIUM 9.0 9.3  MG -- --  PHOS -- --   Liver Function Tests:  No results found for this basename: AST:2,ALT:2,ALKPHOS:2,BILITOT:2,PROT:2,ALBUMIN:2 in the last 168 hours CBC:   Lab 11/06/11 0400 11/02/11 1057  WBC 9.8 14.5*  NEUTROABS 6.9 --  HGB 13.7 13.6  HCT 38.1* 37.8*  MCV 85.2 86.5  PLT 274 229   Coagulation:  No results found for this basename: LABPROT:4,INR:4 in the last 168 hours Cardiac Enzymes:   Lab 11/02/11 1057  CKTOTAL 349*  CKMB --  CKMBINDEX --  TROPONINI --   Urinalysis negatvie  Urine Drug Screen:  normal  CT of the brain 11/04/2011 Stable large hemorrhage involving right thalamus with stable surrounding vasogenic edema. Stable to minimally more prominent mass effect on the third ventricle, with minimal increased dilatation of the third ventricle and temporal horns of the lateral ventricles. Stable bifrontal subarachnoid hemorrhage. 11/03/2011 No significant change seen involving large intraparenchymal hemorrhage in the right thalamus compared to prior exam.  There does appear to be a decreased amount of hemorrhage within the right lateral ventricle compared to prior exam. No change is seen involving small amount of hemorrhage in right occipital horn or small amount of subarachnoid hemorrhage noted previously 11/01/2011   Right thalamic hemorrhage with ventricular extension.  No significant change from 10/30/2011.   10/30/2011   Acute intraparenchymal hemorrhage in the right thalamus extending to the right lateral ventricle.  Surrounding edema and mild mass effect without midline shift.   MRI of the brain 11/01/2011  4.1 x 2.5 cm acute hematoma with the epicenter in the right thalamus as outlined  above.  Surrounding edema.  Mass effect with displacement distortion of the third ventricle but no suspicion of obstructive hydrocephalus at this moment.  Small amount of intraventricular blood.   MRA of the brain 11/01/2011   No evidence of high flow vascular malformation.  No large or medium vessel occlusion, correctable stenosis or aneurysm.     CXR  11/03/2011 Low volume film with mild cardiomegaly. No acute cardiopulmonary  finding. 10/31/2011   No acute abnormality.  EKG  NSR.   EEG 11/01/2011 no seziure  Therapy Recommendations PT - CIR; OT -CIR ; ST - CIR.  Physical Exam   Pleasant middle aged Caucasian male not in distress. Afebrile. Head is nontraumatic. Neck is supple without bruit. Hearing is normal. Cardiac exam no murmur or gallop. Lungs are clear to auscultation.  Neurological Exam :  Mental Status: Drowsy but will arouse easily and will maintain alertness and attention for the majority of exam.  Oriented x 3, thought content appropriate, insight reasonable.  Speech fluent with Frequent circumlocution and minimal dysarthria.  Able to follow 3 step commands without difficulty.  Left neglect Cranial Nerves: II-partial left homonymous hemianopsia; c/o multiple vision with both eyes open -side-by side III/IV/VI-Extraocular movements intact.  Pupils reactive bilaterally, left pupil slightly larger than right.. V/VII-lower left facial droop VIII-grossly intact IX/X-not assessed XI-unable to shrug left shoulder XII-midline tongue extension Motor: 5/5 RUE/RLE, 0/5 RUE/RLE.  No grip on left. Increased tone LUE/LLE Sensory: no response to noxious stim on left.  Deep Tendon Reflexes: 2+ and symmetric throughout Plantars:upgoing left, downgoing right. bilaterally Cerebellar: not tested.  ASSESSMENT [name] is a 61 y.o. 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 Hemorrhage felt to be due  to malignant hypertension. Now off cardene gtt.   Patient with resultant left hemiparesis and lethargy. Therapies are recommending inpatient rehabilitation; family prefers SNF placement.  -lethargy much improved though not resolved. -Neurosurgery consulted for possible ventriculostomy but felt not to be a candidate. Stable from NS standpoinndt. -Malignant hypertension. Off cardene. Started PO medications 8/21.  But limited po intake due to sleepiness. He is on norvasc 10mg  qday, HCTZ 25mg  qday, clonidine 0.1 mg patch and  Lisinopril 20mg  qam. Overall BP improving. -rhabdomyolysis, found down. CK high on admission. Resolved. -leukocytosis, resolved -hypokalemia, replaced  Hospital day # 8  TREATMENT/PLAN -SNF placement. SW following. Patient medically ready for discharge -recheck potassium. Replace as needed  This patient is critically ill and at significant risk of neurological worsening, death and care requires constant monitoring of vital signs, hemodynamics,respiratory and cardiac monitoring,review of multiple databases, neurological assessment, discussion with family, other specialists and medical decision making of high complexity. I spent 30 minutes of neurocritical care time  in the care of  this patient.  SHARON BIBY, MSN, RN, ANVP-BC, ANP-BC, GNP-BC [facility] Pager: [phone] 11/07/2011 9:58 AM  Scribe for Dr. Delia Heady, Stroke Center Medical Director, who has personally reviewed chart, pertinent data, examined the patient and developed the plan of care. Pager:  [phone]

## 2011-11-07 NOTE — Progress Notes (Signed)
Speech Language Pathology Treatment Patient Details Name: Anthony Hardin MRN: [id number] DOB: 1951-01-26 Today's Date: 11/07/2011 Time: 8295-6213 SLP Time Calculation (min): 18 min  Assessment / Plan / Recommendation Clinical Impression  Session focused on cognitive and dysphagia treatment. Pt. presented with weakness on left side as well as left-sided visual neglect requiring total hand over assit during ADL (eating/drinking) to sustain attention, locate and grasp cup/cracker (initiation).   No s/s of aspiration appeared at bedside with thin liquids or solid foods. Pt. was lethargic and kept eyes closed most of the session. Majority of Pt.'s responses were tangential with decreased semantics and poor ability to provide accurate information. Pt. perseverative on stopping session so he could rest. Continue Dys.3 with thin liquids due to current cognitive status. Pt. continues to benefit from skilled ST and requires intensive cognitive treatment (inpatient rehab).     SLP Plan  Continue with current plan of care       SLP Goals  SLP Goals Potential to Achieve Goals: Good Potential Considerations: Previous level of function;Family/community support Progress/Goals/Alternative treatment plan discussed with pt/caregiver and they: Agree SLP Goal #1: Patient will sustain attention to basic functional ADL with min verbal cues.  SLP Goal #1 - Progress: Progressing toward goal SLP Goal #2: Patient will attend to left visual field during basic functional ADLs with min visual cues SLP Goal #2 - Progress: Progressing toward goal SLP Goal #3: Patient will initiate carryout of basic functional and familiar ADL with no more than 2 verbal or visual cues.  SLP Goal #3 - Progress: Progressing toward goal SLP Goal #4: Patient will demonstrate basic emergent awareness of problems as related to acute deficits during functional ADLs with mod verbal cues. SLP Goal #4 - Progress: Progressing toward  goal  General Temperature Spikes Noted: No Respiratory Status: Room air Behavior/Cognition: Confused;Uncooperative;Lethargic;Distractible;Requires cueing;Doesn't follow directions;Decreased sustained attention Oral Cavity - Dentition: Adequate natural dentition Patient Positioning: Upright in chair  Oral Cavity - Oral Hygiene Does patient have any of the following "at risk" factors?: Other - dysphagia (Dysphagia 3 diet) Brush patient's teeth BID with toothbrush (using toothpaste with fluoride): Yes Patient is HIGH RISK - Oral Care Protocol followed (see row info): Yes Patient is AT RISK - Oral Care Protocol followed (see row info): Yes   Treatment Treatment focused on: Cognition;Patient/family/caregiver education Family/Caregiver Educated: Pt's sister Skilled Treatment: Pt. seen for skilled ST treatment of diet tolerance of dysphagia 3 /thin liquids. SLP also checked on Pt.'s cognitive state.  Pt. required max verbal cues to answer questions as well as max verbal and tactile cueing to initiate self-feeding.    GO     Theotis Burrow 11/07/2011, 12:05 PM

## 2011-11-07 NOTE — Progress Notes (Signed)
Agree with student's note below.  [name].Ed [employer] [phone]  11/07/2011

## 2011-11-07 NOTE — Clinical Social Work Note (Signed)
CSW spoke with pt's sister regarding possible CIR admission as pt will return to [place] at discharge.   CSW updated CIR admissions coordinator as well as [facility] (pt's choice for SNF). SNF will hold pre auth while CIR is requesting pre auth. CSW will continue to follow.   Dede Query, MSW, [facility] [phone]

## 2011-11-07 NOTE — Progress Notes (Signed)
Clinical Social Worker followed up with pt sister in regard to bed offers. Pt sleeping at this time. Pt sister reports that she is interested in [facility] for pt and facility is currently considering. Clinical Social Worker contacted [facility] and clarified facilities questions and facility would like to speak to pt sister. Clinical Social Worker discussed with pt sister and provided contact information to admissions coordinator for [facility]. Pt sister stated that she plans to contact facility and also consider other options. Clinical Social Worker to follow up with [facility] in regard to bed offer and follow up with pt and pt sister in regard to decision for SNF. Pt insurance requires authorization for SNF placement prior to discharge. Clinical Social Worker to continue to follow and facilitate pt discharge needs when pt medically ready for discharge and insurance authorization received.   Jacklynn Lewis, MSW, LCSWA  Clinical Social Work [phone]

## 2011-11-07 NOTE — Progress Notes (Signed)
Rehab admissions - I received a call from social worker stating that family now interested in acute inpatient rehab admission.  I have faxed information to state [employer] requesting inpatient rehab.  I will follow up after I hear back from insurance carrier.  Call me for questions.  #[phone]

## 2011-11-07 NOTE — Progress Notes (Signed)
Physical Therapy Treatment Patient Details Name: Anthony Hardin MRN: [id number] DOB: 08/30/1950 Today's Date: 11/07/2011 Time: 0865-7846 PT Time Calculation (min): 26 min  PT Assessment / Plan / Recommendation Comments on Treatment Session  Patient agreeable to transfer to recliner today. Patient limited by cognitive perservation this session. Patient standing with little buckling of LLE and was able to transfer to chair with increased time    Follow Up Recommendations  Inpatient Rehab    Barriers to Discharge        Equipment Recommendations  Defer to next venue    Recommendations for Other Services    Frequency Min 4X/week   Plan Discharge plan remains appropriate;Frequency remains appropriate    Precautions / Restrictions Precautions Precautions: Fall   Pertinent Vitals/Pain     Mobility  Bed Mobility Rolling Right: 3: Mod assist Right Sidelying to Sit: 3: Mod assist Sitting - Scoot to Edge of Bed: 2: Max assist Details for Bed Mobility Assistance: A with positioning of LEs and elevations of trunk and shoulers. patient did pull up on my arm for assistance. Patient required manual facilitation to complete task Transfers Transfers: Stand Pivot Transfers Sit to Stand: 1: +2 Total assist;From bed;From chair/3-in-1 Sit to Stand: Patient Percentage: 50% Stand to Sit: To chair/3-in-1;1: +2 Total assist;To bed Stand to Sit: Patient Percentage: 50% Stand Pivot Transfers: 1: +2 Total assist Stand Pivot Transfers: Patient Percentage: 40% Details for Transfer Assistance: A to initiate stand and facilitate hip extension for upright posture. Patient cued to put weight through ball of foot as he tends to lean back on heels. A for weight shifting and pivotal steps towards recliner.     Exercises     PT Diagnosis:    PT Problem List:   PT Treatment Interventions:     PT Goals Acute Rehab PT Goals PT Goal: Rolling Supine to Right Side - Progress: Progressing toward goal PT  Goal: Supine/Side to Sit - Progress: Progressing toward goal PT Goal: Sit to Stand - Progress: Progressing toward goal PT Transfer Goal: Bed to Chair/Chair to Bed - Progress: Progressing toward goal  Visit Information  Last PT Received On: 11/07/11 Assistance Needed: +2    Subjective Data      Cognition  Overall Cognitive Status: Impaired Area of Impairment: Attention;Memory;Following commands;Problem solving Arousal/Alertness: Awake/alert Orientation Level: Oriented X4 / Intact Behavior During Session: [facility] for tasks performed Current Attention Level: Sustained Attention - Other Comments: Patient perservating on " COLA" and needing his cola and that is all he needed.  Following Commands: Follows one step commands inconsistently Cognition - Other Comments: Patient very fixated on his need for cola. Patients sister stated that yesterday he was able to carry a conversation however today patient has no keep attention or conversation easily. Patient was waxing and wanning with conversation this session    Balance  Static Sitting Balance Static Sitting - Balance Support: Right upper extremity supported;Feet supported Static Sitting - Level of Assistance: 3: Mod assist Static Sitting - Comment/# of Minutes: Patient with improved sitting balance. cuesing for posture Static Standing Balance Static Standing - Balance Support: Bilateral upper extremity supported Static Standing - Level of Assistance: 1: +2 Total assist Static Standing - Comment/# of Minutes: Stood x2 mins with assistance for weight shifting and posture  End of Session PT - End of Session Equipment Utilized During Treatment: Gait belt Activity Tolerance: Patient tolerated treatment well Patient left: in chair;with call bell/phone within reach;with family/visitor present Nurse Communication: Mobility status   GP  Fredrich Birks 11/07/2011, 1:22 PM 11/07/2011 Fredrich Birks PTA [phone]  pager [phone] office

## 2011-11-08 MED ORDER — POTASSIUM CHLORIDE CRYS ER 10 MEQ PO TBCR
10.0000 meq | EXTENDED_RELEASE_TABLET | Freq: Every day | ORAL | Status: DC
  Administered 2011-11-08 – 2011-11-14 (×7): 10 meq via ORAL
  Filled 2011-11-08 (×7): qty 1

## 2011-11-08 NOTE — Progress Notes (Signed)
Stroke Team Progress Note  HISTORY Anthony Hardin is an 61 y.o. male who was last seen normal on 8/16 after work. Patient reports that he began to feel numbness and weakness on his left side on Saturday. Reports that he was unable to move and in attempting to move fell out of the bed and onto the floor. Was unable to get off the floor and was found by co-workers 08/19. EMS was called and patient was brought in for evaluation. 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  Surrounding edema and mild mass effect without midline shift.   SUBJECTIVE Family at bedside (pt's brother-in-law). They are now looking at taking pt to [place] with inpatient rehab here prior to going there.  OBJECTIVE Most recent Vital Signs: Filed Vitals:   11/07/11 1400 11/07/11 2200 11/08/11 0200 11/08/11 0600  BP: 148/75 182/93 170/87 190/105  Pulse: 65 61 75 61  Temp: 97.5 F (36.4 C) 98.5 F (36.9 C) 98.1 F (36.7 C) 97.4 F (36.3 C)  TempSrc: Oral     Resp: 18 16 16 16   Height:      Weight:      SpO2: 99% 95% 97% 98%   Intake/Output from previous day: 08/27 0701 - 08/28 0700 In: 240 [P.O.:240] Out: 1400 [Urine:1400]  IV Fluid Intake:   MEDICATIONS     . amLODipine  10 mg Oral Daily  . antiseptic oral rinse  15 mL Mouth Rinse BID  . cloNIDine  0.1 mg Transdermal Weekly  . enoxaparin (LOVENOX) injection  40 mg Subcutaneous Q24H  . feeding supplement  237 mL Oral TID BM  . feeding supplement  1 Container Oral TID BM  . hydrochlorothiazide  25 mg Oral Daily  . lisinopril  20 mg Oral Daily  . pantoprazole  20 mg Oral Q1200  . senna-docusate  1 tablet Oral BID   PRN:  acetaminophen, hydrALAZINE, labetalol, ondansetron (ZOFRAN) IV, zolpidem  Diet:  DYS 3 THIN liquids Activity:  Ambulate  DVT Prophylaxis:  Lovenox 40 mg sq daily   CLINICALLY SIGNIFICANT STUDIES Basic Metabolic Panel:   Lab 11/07/11 1040 11/04/11 0422  NA 138 140  K  3.4* 3.6  CL 98 102  CO2 29 23  GLUCOSE 142* 127*  BUN 15 16  CREATININE 0.95 0.67  CALCIUM 10.1 9.0  MG -- --  PHOS -- --   CBC:   Lab 11/06/11 0400 11/02/11 1057  WBC 9.8 14.5*  NEUTROABS 6.9 --  HGB 13.7 13.6  HCT 38.1* 37.8*  MCV 85.2 86.5  PLT 274 229   Cardiac Enzymes:   Lab 11/02/11 1057  CKTOTAL 349*  CKMB --  CKMBINDEX --  TROPONINI --   Urinalysis negatvie  Urine Drug Screen:  normal  CT of the brain 11/04/2011 Stable large hemorrhage involving right thalamus with stable surrounding vasogenic edema. Stable to minimally more prominent mass effect on the third ventricle, with minimal increased dilatation of the third ventricle and temporal horns of the lateral ventricles. Stable bifrontal subarachnoid hemorrhage. 11/03/2011 No significant change seen involving large intraparenchymal hemorrhage in the right thalamus compared to prior exam. There does appear to be a decreased amount of hemorrhage within the right lateral ventricle compared to prior exam. No change is seen involving small amount of hemorrhage in right occipital horn or small amount of subarachnoid hemorrhage noted previously 11/01/2011   Right thalamic hemorrhage with ventricular extension.  No significant change from 10/30/2011.   10/30/2011  Acute intraparenchymal hemorrhage in the right thalamus extending to the right lateral ventricle.  Surrounding edema and mild mass effect without midline shift.   MRI of the brain 11/01/2011  4.1 x 2.5 cm acute hematoma with the epicenter in the right thalamus as outlined above.  Surrounding edema.  Mass effect with displacement distortion of the third ventricle but no suspicion of obstructive hydrocephalus at this moment.  Small amount of intraventricular blood.   MRA of the brain 11/01/2011   No evidence of high flow vascular malformation.  No large or medium vessel occlusion, correctable stenosis or aneurysm.     CXR  11/03/2011 Low volume film with mild  cardiomegaly. No acute cardiopulmonary  finding. 10/31/2011   No acute abnormality.  EKG  NSR.   EEG 11/01/2011 no seziure  Therapy Recommendations PT - CIR; OT -CIR ; ST - CIR.  Physical Exam   Pleasant middle aged Caucasian male not in distress. Afebrile. Head is nontraumatic. Neck is supple without bruit. Hearing is normal. Cardiac exam no murmur or gallop. Lungs are clear to auscultation.  Neurological Exam :  Mental Status: Drowsy but will arouse easily and will maintain alertness and attention for the majority of exam.  Oriented x 3, thought content appropriate, insight reasonable.  Speech fluent with Frequent circumlocution and minimal dysarthria.  Able to follow 3 step commands without difficulty.  Left neglect Cranial Nerves: II-partial left homonymous hemianopsia; c/o multiple vision with both eyes open -side-by side III/IV/VI-Extraocular movements intact.  Pupils reactive bilaterally, left pupil slightly larger than right.. V/VII-lower left facial droop VIII-grossly intact IX/X-not assessed XI-unable to shrug left shoulder XII-midline tongue extension Motor: 5/5 RUE/RLE, 0/5 RUE/RLE.  No grip on left. Increased tone LUE/LLE Sensory: no response to noxious stim on left.  Deep Tendon Reflexes: 2+ and symmetric throughout Plantars:upgoing left, downgoing right. bilaterally Cerebellar: not tested.  ASSESSMENT [name] is a 61 y.o. 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 Now off cardene gtt.   Patient with resultant left hemiparesis and lethargy. Therapies are recommending inpatient rehabilitation; famiy now agreeable to CIR here with his planned return with them to [place] following.  -lethargy much improved though not resolved. Waxes and wanes. -Neurosurgery consulted for possible ventriculostomy but felt not to be a candidate.  Stable from NS standpoinndt. -Malignant hypertension. Off cardene. Started PO medications 8/21.  But limited po intake due to sleepiness. He is on norvasc 10mg  qday, HCTZ 25mg  qday, clonidine 0.1 mg patch and  Lisinopril 20mg  qam. Overall BP improving. -rhabdomyolysis, found down. CK high on admission. Resolved. -leukocytosis, resolved -hypokalemia, replaced, today 3.4 with new addition of HCTZ this admission. Will add daily low dose K replacement. Will ask rehab staff to follow and adjust there  Hospital day # 9  TREATMENT/PLAN -rehab \. Patient medically ready for discharge - add low dose Kdur. Ask rehab to followup  Annie Main, MSN, RN, ANVP-BC, ANP-BC, GNP-BC [facility] Pager: [phone] 11/08/2011 10:45 AM  Scribe for Dr. Delia Heady, Stroke Center Medical Director, who has personally reviewed chart, pertinent data, examined the patient and developed the plan of care. Pager:  [phone]

## 2011-11-08 NOTE — Progress Notes (Signed)
Physical Therapy Treatment Patient Details Name: Anthony BELLAND MRN: [id number] DOB: May 13, 1950 Today's Date: 11/08/2011 Time: 4098-1191 PT Time Calculation (min): 43 min  PT Assessment / Plan / Recommendation Comments on Treatment Session  Patient limited by arousal this session. See OT note for further details. Patient more conversational at times today and following instructions better with increased time    Follow Up Recommendations  Inpatient Rehab    Barriers to Discharge        Equipment Recommendations  Defer to next venue    Recommendations for Other Services    Frequency Min 4X/week   Plan Discharge plan remains appropriate;Frequency remains appropriate    Precautions / Restrictions Precautions Precautions: Fall   Pertinent Vitals/Pain     Mobility  Bed Mobility Right Sidelying to Sit: 4: Min assist;With rails;HOB flat Sitting - Scoot to Edge of Bed: 2: Max assist Details for Bed Mobility Assistance: Facilitation given for scooting of LEs and initiation of shouder elevation into sitting. Requires increase processing time Transfers Sit to Stand: 1: +2 Total assist;From bed Sit to Stand: Patient Percentage: 40% Stand to Sit: To chair/3-in-1;1: +2 Total assist Stand to Sit: Patient Percentage: 40% Stand Pivot Transfers: 1: +2 Total assist Stand Pivot Transfers: Patient Percentage: 40% Details for Transfer Assistance: Patient required A for all aspects of stand. Patient with increase time to initiate movement and needing more assistance and tactile cueing with transfer today. Patient unable to fully extend knees and hips this session. A for all aspects of SPT. Patient able to step R LE yesterday but umable to pick up R LE this sessoin    Exercises     PT Diagnosis:    PT Problem List:   PT Treatment Interventions:     PT Goals Acute Rehab PT Goals PT Goal: Rolling Supine to Right Side - Progress: Progressing toward goal PT Goal: Supine/Side to Sit -  Progress: Progressing toward goal PT Goal: Sit to Stand - Progress: Progressing toward goal PT Transfer Goal: Bed to Chair/Chair to Bed - Progress: Progressing toward goal  Visit Information  Last PT Received On: 11/08/11 Assistance Needed: +2 PT/OT Co-Evaluation/Treatment: Yes    Subjective Data      Cognition  Overall Cognitive Status: Impaired Area of Impairment: Attention;Following commands;Safety/judgement;Problem solving Arousal/Alertness: Lethargic Orientation Level: Oriented X4 / Intact Behavior During Session: Lethargic Current Attention Level: Sustained Following Commands: Follows one step commands inconsistently Cognition - Other Comments: patient in and out of arousal state throughout session    Balance  Static Sitting Balance Static Sitting - Balance Support: Right upper extremity supported;Feet supported Static Sitting - Level of Assistance: 4: Min assist Static Standing Balance Static Standing - Balance Support: Bilateral upper extremity supported Static Standing - Level of Assistance: 1: +2 Total assist  End of Session PT - End of Session Equipment Utilized During Treatment: Gait belt Activity Tolerance: Patient tolerated treatment well Patient left: in chair;with family/visitor present Nurse Communication: Mobility status   GP     Fredrich Birks 11/08/2011, 3:01 PM 11/08/2011 Fredrich Birks PTA [phone] pager [phone] office

## 2011-11-08 NOTE — Progress Notes (Signed)
Occupational Therapy Treatment Patient Details Name: Anthony Hardin MRN: [id number] DOB: [birth year] Today's Date: 11/08/2011 Time: 4098-1191 OT Time Calculation (min): 49 min  OT Assessment / Plan / Recommendation Comments on Treatment Session Pt. with variable arrousal today - alternates between conversing then falling asleep or zoning out.  With verbal and tactile cues, pt able to re-arrouse, and re-direct attention talking in full sentences.  Pt. demonstrates deficits with initiation and motor planning, but question if his RAS and arrousal cycles are affected - these behaviors occurred in all positions with pt. seated EOB and in standing.  Pt. will visually attend to Lt today with mod cues, but requires max assist/cues to locate Lt. UE.  Glasses were nasally occluded on Lt. to reduce impact of diplopia while allowing eyes to work binocularly and allowing pt. peripheral input.    Follow Up Recommendations  Inpatient Rehab    Barriers to Discharge       Equipment Recommendations  Defer to next venue    Recommendations for Other Services Rehab consult  Frequency Min 2X/week   Plan Discharge plan remains appropriate    Precautions / Restrictions Precautions Precautions: Fall Precaution Comments: Pt. with Lt. neglect Restrictions Weight Bearing Restrictions: No   Pertinent Vitals/Pain     ADL  Toilet Transfer: Simulated;+2 Total assistance Toilet Transfer: Patient Percentage: 10% Toilet Transfer Method: Stand pivot Toilet Transfer Equipment: Raised toilet seat with arms (or 3-in-1 over toilet) Toileting - Clothing Manipulation and Hygiene: Performed;+2 Total assistance Toileting - Clothing Manipulation and Hygiene: Patient Percentage: 0% Transfers/Ambulation Related to ADLs: Pt. transferred to recliner with total A +3.  pt. performed (~10%) - see below for details ADL Comments: Pt. moved to EOB sitting with min A.  Pt. with motor planning deficits, initiation deficits, and  question if his RAS is impaired as he appears to fall asleep mid activity, then will awaken and converse in full sentences, and repeat the cycle.  Pt. also noted to do this in both seated and standing positions.  Pt. with Lt. neglect.  pt. will look to Lt. but unable to locate Lt. UE - required max verbal and tactile cuing, and then pt. grabbed Therapist's hand instead.   While sitting EOB, pt. used Lt. UE as an active support, once Lt. UE positioned.  No volitional movement noted Lt. UE.  Difficult to asses Lt. UE due to severity of neglect.  Pt. demonstrates associated reactions, and demonstrates flexor spasticity, but when he is holding Rt. UE with Lt. UE appeared to possibly activate/move Lt UE slightly, but unable to determine for sure as unable to replicate this.  Brother in law in room, and reports pt. wears glasses - pt indicates they are not bifocals/trifocals.  Pt continues to endorse diplopia often closing Lt. eye.  Pt. unable to participate in accurate visual assesment at this time due to variable arrousal level and inattention.  Dysconjugate gaze was noted.  Glasses were nasally occluded on Lt. to reduce diplopia while allowing pt. to use eyes binocularly, and to allow use of peripheral visual input.    (Add:  Looks Lt. with mod - maximal cues/stimuli)    OT Diagnosis:    OT Problem List:   OT Treatment Interventions:     OT Goals ADL Goals Additional ADL Goal #1: Pt. will maintain arrousal for 5 mins intervals ADL Goal: Additional Goal #1 - Progress: Goal set today Additional ADL Goal #2: Pt. will use Lt. UE as a support with min A ADL Goal:  Additional Goal #2 - Progress: Goal set today Miscellaneous OT Goals OT Goal: Miscellaneous Goal #1 - Progress: Progressing toward goals OT Goal: Miscellaneous Goal #2 - Progress: Progressing toward goals OT Goal: Miscellaneous Goal #3 - Progress: Discontinued (comment) (not appropriate)  Visit Information  Last OT Received On:  11/08/11 Assistance Needed: +2    Subjective Data      Prior Functioning       Cognition  Overall Cognitive Status: Impaired Area of Impairment: Attention;Following commands;Awareness of errors;Awareness of deficits;Problem solving Arousal/Alertness: Lethargic (Pt with alternating arrousal during session) Orientation Level: Oriented X4 / Intact Behavior During Session: Lethargic (alternating awake/lethargy cycles) Current Attention Level: Focused;Sustained (variable with lethargy/arrousal cycles) Attention - Other Comments: Pt. with alternating arrousal - will present as sleepy, asleep, zoned out, to fully conversant within seconds of one another.  This occurs with pt. supine, sitting and standing - question involvement of RAS Following Commands: Follows one step commands inconsistently (dependent on arrousal and intitiation) Awareness of Errors: Assistance required to identify errors made;Assistance required to correct errors made (difficult to asses if due to poor awareness vs. attentional) Problem Solving: mod - max verbal cues for familiar tasks Cognition - Other Comments: Pt able to state to a friend that he has a long road of recovery ahead, and at some level seems aware of severity of deficits.  Pt. also noted to joke appropriately with family    Mobility  Shoulder Instructions Bed Mobility Bed Mobility: Supine to Sit;Sitting - Scoot to [place] of Bed;Sit to Supine Rolling Right: 4: Min assist (with step by step cues and facilitation) Right Sidelying to Sit: 4: Min assist;With rails;HOB flat Sitting - Scoot to Edge of Bed: 2: Max assist Details for Bed Mobility Assistance: Facilitation to flex Lt. knee and to roll to Rt.  as well as facilitation at scap and hip to move into seated position.  Pt. requires increased time to initiate all movements/tasks - at times at least 30 seconds were required and instruction had to be repeated.  Pt. however, able to initiate and perform full  sequence of movement when provided with adequate time.   Transfers Transfers: Sit to Stand;Stand to Sit Sit to Stand: 1: +2 Total assist;From bed Sit to Stand: Patient Percentage: 40% Stand to Sit: To chair/3-in-1;1: +2 Total assist Stand to Sit: Patient Percentage: 40% Details for Transfer Assistance: Patient required A for all aspects of stand. Patient with increase time to initiate movement and needing more assistance and tactile cueing with transfer today. Patient unable to fully extend knees and hips this session. A for all aspects of SPT. Patient able to step R LE yesterday but umable to pick up R LE this sessoin       Exercises      Balance Static Sitting Balance Static Sitting - Balance Support: Right upper extremity supported;Feet supported Static Sitting - Level of Assistance: 4: Min assist Static Sitting - Comment/# of Minutes: Pt. maintains midline  Static Standing Balance Static Standing - Balance Support: Bilateral upper extremity supported Static Standing - Level of Assistance: 1: +2 Total assist Static Standing - Comment/# of Minutes: Pt. leaning left, and unable to correct.  Arrousal decreased during this time, with pt. requiring maximal tactile and verbal cues to fully rouse pt.   End of Session OT - End of Session Activity Tolerance: Patient tolerated treatment well (Pt. with variable arrousal levels) Patient left: in chair;with call bell/phone within reach;with nursing in room  GO     Anthony Hardin,  [name] 11/08/2011, 5:54 PM

## 2011-11-08 NOTE — Progress Notes (Signed)
Rehab admissions - I discussed patient progress and participation with Dr. Wynn Banker and rehab PA.  We feel patient cannot tolerate or benefit from 3 hours of therapy a day at this time. He is unlikely to make measurable gains on inpatient rehab.  His sister has been unable to come up with a viable discharge plan for care after rehab.  Our team feels that even after a rehab stay, he will still need SNF.  Therefore, we are recommending pursuit of SNF and not admission to inpatient rehab.  Call me for questions.  #[phone]

## 2011-11-09 ENCOUNTER — Ambulatory Visit (HOSPITAL_COMMUNITY): Payer: BC Managed Care – PPO

## 2011-11-09 MED ORDER — METHYLPHENIDATE HCL ER (LA) 10 MG PO CP24: 10.0000 mg | ORAL_CAPSULE | Freq: Every day | ORAL | Status: DC

## 2011-11-09 MED ORDER — METHYLPHENIDATE HCL 5 MG PO TABS
5.0000 mg | ORAL_TABLET | Freq: Every day | ORAL | Status: DC
  Administered 2011-11-10: 5 mg via ORAL
  Filled 2011-11-09: qty 1

## 2011-11-09 MED ORDER — ZOLPIDEM TARTRATE 5 MG PO TABS: 5.0000 mg | ORAL_TABLET | Freq: Every evening | ORAL | Status: DC | PRN

## 2011-11-09 NOTE — Clinical Social Work Note (Signed)
CSW met with pt and family to address discharge plan. CSW inquired about interested in inpatient rehab at another facility, as [employer] offered. Family declined. Per MD, he will do a MD to MD for CIR. [facility] is still an option. CSW left a message with [facility] admissions coordinator to discuss status of discharge. Per [employer], they will authorize CIR or SNF. CSW will continue to follow.   Dede Query, MSW, Theresia Majors [phone]

## 2011-11-09 NOTE — Progress Notes (Signed)
Around 2215, nurse and tech were in pt's room getting VS. Pt was answering questions and following commands and then stopped. Pt appeared to be staring into space and was not responding to nurse or tech. VS normal. This episode lasted 5 seconds. Pt stated "I must have fallen asleep." No further change in neuro status after event. Dr. Thad Ranger called regarding pt's episode. Pt placed on seizure precautions and EEG ordered for am. Will continue to monitor pt. Report given to nurse taking pt at 2300. Salvadore Oxford, RN 11/08/2011 2300

## 2011-11-09 NOTE — Progress Notes (Signed)
Occupational Therapy Treatment Patient Details Name: Anthony Hardin MRN: [id number] DOB: 1950/04/19 Today's Date: 11/09/2011 Time: 1308-6578 OT Time Calculation (min): 92 min  OT Assessment / Plan / Recommendation Comments on Treatment Session Pt. significantly more Anthony today - fully arroused for at least 85 mins.  Pt. actively participating, able to brush teeth with assistance; attending to Lt. side; attempting to use/move Lt. UE and LE on command.  Demonstrates significant difficulty initating movement/activities and demonstrates attentional deficits.  Requires verbal and tactile cues as well as increased time.  Pt. also beginning to demonstrate awareness of deficits.  Continue to feel he would be an EXCELLENT candidate for CIR    Follow Up Recommendations  Inpatient Rehab    Barriers to Discharge       Equipment Recommendations  Defer to next venue    Recommendations for Other Services    Frequency     Plan Discharge plan remains appropriate    Precautions / Restrictions Precautions Precautions: Fall Precaution Comments: Pt. with Lt. neglect Restrictions Weight Bearing Restrictions: No   Pertinent Vitals/Pain     ADL  Eating/Feeding: Performed;Supervision/safety;Minimal assistance (to drink from a cup) Grooming: Performed;Teeth care;Minimal assistance (and mod cues due to attention) Where Assessed - Grooming: Unsupported sitting ADL Comments: Pt. sat EOB x ~ 60 mins with min guard to occasional min A especially as pt. fatigued, he would lean to the Lt.  Pt. maintained full arrousal for at least 85 mins!!!  Although pt was fatiguing toward end of session.  Pt. conversing with therapist on his Lt. making eye contact while supine.  Once seated, Pt. started patting Lt. LE, and lifting Lt. LE with Rt UE and stated "this left side feels dead"   Pt. also located Lt. UE with min verbal cues. Pt. attempted to clap hands together with mod verbal cues and increased time to initiate -  volitional movement noted Lt. UE during task, and pt. contracted Lt. quad when asked to "kick the ball" with his Lt. LE - again requires increaed time due to attentional and initiation deficits  Pt. drank from cup consistently with set up assistance.  Pt. brushed teeth with assistance to initiate task - bringing toothbrush to mouth, and one cue and time to continue when he became distracted.  Pt very fatigued by end of session.  When given increased time and moderate prompts for attention and initiation, pt. required min A to return to supine - he did attempt to lift Lt. LE - activation of Lt. quad again noted.  At end of session, pt. stated "I've got a long way to go.  I need to be able to initiate.  I can't initiate"     OT Diagnosis:    OT Problem List:   OT Treatment Interventions:     OT Goals ADL Goals ADL Goal: Grooming - Progress: Progressing toward goals ADL Goal: Additional Goal #1 - Progress: Met ADL Goal: Additional Goal #2 - Progress: Progressing toward goals Miscellaneous OT Goals OT Goal: Miscellaneous Goal #1 - Progress: Progressing toward goals OT Goal: Miscellaneous Goal #2 - Progress: Progressing toward goals OT Goal: Miscellaneous Goal #3 - Progress: Discontinued (comment)  Visit Information  Last OT Received On: 11/09/11 Assistance Needed: +2 (for transfer)    Subjective Data      Prior Functioning       Cognition  Overall Cognitive Status: Impaired Area of Impairment: Attention;Following commands;Problem solving;Awareness of deficits;Awareness of errors Arousal/Alertness: Awake/Anthony Behavior During Session:  (fatigued toward the  end of session, but re-arrouse after ~5 ) Current Attention Level: Focused;Sustained Attention - Other Comments: Difficult to accurately gauge level of attention due to pt. with significant deficits with initiation.  Unsure if pt. truly losing focus, or if he just can't initiate the movement/activity requested Following Commands:  Follows one step commands consistently (with verbal and/or physical prompts - ) Awareness of Errors: Assistance required to identify errors made Awareness of Errors - Other Comments: Pt. with improving awareness of errors, as pt sporadically verbalizing where he is having difficulty  Awareness of Deficits: Pt. with emerging awareness of deficits.  He tries to intellectualize it. Problem Solving: difficult to accurately asses due to severity of initiation deficits Cognition - Other Comments: Pt. maintained arrousal x at least 85 mins.      Mobility  Shoulder Instructions Bed Mobility Bed Mobility: Rolling Right;Right Sidelying to Sit;Sit to Sidelying Right Rolling Right: 4: Min assist (facilitation at shoulder and knee) Right Sidelying to Sit: 4: Min assist;With rails;HOB flat (increased time, and facilitation at shoulder) Sitting - Scoot to Edge of Bed: 4: Min guard Sit to Sidelying Right: 4: Min assist (increased time, and faciliation of Lt. LE) Details for Bed Mobility Assistance: Pt. needs considerable time to initiate movement       Exercises      Balance     End of Session OT - End of Session Activity Tolerance: Patient tolerated treatment well Patient left: in bed;with call bell/phone within reach;with bed alarm set;with family/visitor present  GO     Anthony Hardin, Anthony Hardin Anthony Hardin 11/09/2011, 5:55 PM

## 2011-11-09 NOTE — Progress Notes (Signed)
Stroke Team Progress Note  HISTORY Anthony Hardin is an 61 y.o. male who was last seen normal on 8/16 after work. Patient reports that he began to feel numbness and weakness on his left side on Saturday. Reports that he was unable to move and in attempting to move fell out of the bed and onto the floor. Was unable to get off the floor and was found by co-workers 08/19. EMS was called and patient was brought in for evaluation. 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  Surrounding edema and mild mass effect without midline shift.   SUBJECTIVE Family and friends concerned about fluctuations in his wakefulness and ability to participate in therapy. REHAB MD feels he may not benefit from inpatient rehab at present time even though we have insurance approval for inpatient rehab. Family struggling with disposition decision. i met with brother in law and spoke to sister and Anthony Hardin over phone. OBJECTIVE Most recent Vital Signs: Filed Vitals:   11/08/11 1800 11/08/11 2154 11/09/11 0243 11/09/11 0614  BP: 167/79 158/96 148/86 192/88  Pulse: 72 76  68  Temp: 97.6 F (36.4 C) 98.4 F (36.9 C) 98.7 F (37.1 C) 98.6 F (37 C)  TempSrc: Oral Oral  Oral  Resp: 16 16 16 16   Height:      Weight:      SpO2: 98% 96% 96% 97%   Intake/Output from previous day: 08/28 0701 - 08/29 0700 In: -  Out: 1204 [Urine:1204]  IV Fluid Intake:   MEDICATIONS     . amLODipine  10 mg Oral Daily  . antiseptic oral rinse  15 mL Mouth Rinse BID  . cloNIDine  0.1 mg Transdermal Weekly  . enoxaparin (LOVENOX) injection  40 mg Subcutaneous Q24H  . feeding supplement  237 mL Oral TID BM  . feeding supplement  1 Container Oral TID BM  . hydrochlorothiazide  25 mg Oral Daily  . lisinopril  20 mg Oral Daily  . pantoprazole  20 mg Oral Q1200  . potassium chloride  10 mEq Oral Daily  . senna-docusate  1 tablet Oral BID   PRN:  acetaminophen,  hydrALAZINE, labetalol, ondansetron (ZOFRAN) IV, zolpidem  Diet:  DYS 3 THIN liquids Activity:  Ambulate  DVT Prophylaxis:  Lovenox 40 mg sq daily   CLINICALLY SIGNIFICANT STUDIES Basic Metabolic Panel:   Lab 11/07/11 1040 11/04/11 0422  NA 138 140  K 3.4* 3.6  CL 98 102  CO2 29 23  GLUCOSE 142* 127*  BUN 15 16  CREATININE 0.95 0.67  CALCIUM 10.1 9.0  MG -- --  PHOS -- --   CBC:   Lab 11/06/11 0400 11/02/11 1057  WBC 9.8 14.5*  NEUTROABS 6.9 --  HGB 13.7 13.6  HCT 38.1* 37.8*  MCV 85.2 86.5  PLT 274 229   Cardiac Enzymes:   Lab 11/02/11 1057  CKTOTAL 349*  CKMB --  CKMBINDEX --  TROPONINI --   Urinalysis negatvie  Urine Drug Screen:  normal  CT of the brain 11/04/2011 Stable large hemorrhage involving right thalamus with stable surrounding vasogenic edema. Stable to minimally more prominent mass effect on the third ventricle, with minimal increased dilatation of the third ventricle and temporal horns of the lateral ventricles. Stable bifrontal subarachnoid hemorrhage. 11/03/2011 No significant change seen involving large intraparenchymal hemorrhage in the right thalamus compared to prior exam. There does appear to be a decreased amount of hemorrhage within the right lateral ventricle  compared to prior exam. No change is seen involving small amount of hemorrhage in right occipital horn or small amount of subarachnoid hemorrhage noted previously 11/01/2011   Right thalamic hemorrhage with ventricular extension.  No significant change from 10/30/2011.   10/30/2011   Acute intraparenchymal hemorrhage in the right thalamus extending to the right lateral ventricle.  Surrounding edema and mild mass effect without midline shift.   MRI of the brain 11/01/2011  4.1 x 2.5 cm acute hematoma with the epicenter in the right thalamus as outlined above.  Surrounding edema.  Mass effect with displacement distortion of the third ventricle but no suspicion of obstructive hydrocephalus at  this moment.  Small amount of intraventricular blood.   MRA of the brain 11/01/2011   No evidence of high flow vascular malformation.  No large or medium vessel occlusion, correctable stenosis or aneurysm.     CXR  11/03/2011 Low volume film with mild cardiomegaly. No acute cardiopulmonary finding. 10/31/2011   No acute abnormality.  EKG  NSR.   EEG 11/01/2011 no seziure  Therapy Recommendations PT - CIR; OT -CIR ; ST - CIR.  Physical Exam   Pleasant middle aged Caucasian male not in distress. Afebrile. Head is nontraumatic. Neck is supple without bruit. Hearing is normal. Cardiac exam no murmur or gallop. Lungs are clear to auscultation.  Neurological Exam :  Mental Status: Awake alert and cooperative..  Oriented x 3, thought content appropriate, insight reasonable.  Speech fluent with Frequent circumlocution and minimal dysarthria.  Able to follow 3 step commands without difficulty.  Left neglect persists.  Cranial Nerves: II-partial left homonymous hemianopsia; c/o multiple vision with both eyes open -side-by side III/IV/VI-Extraocular movements intact.  Pupils reactive bilaterally, left pupil slightly larger than right.. V/VII-lower left facial droop VIII-grossly intact IX/X-not assessed XI-unable to shrug left shoulder XII-midline tongue extension Motor: 5/5 RUE/RLE, 0/5 RUE/RLE.  No grip on left. Increased tone LUE/LLE Sensory: no response to noxious stim on left.  Deep Tendon Reflexes: 2+ and symmetric throughout Plantars:upgoing left, downgoing right. bilaterally Cerebellar: not tested.  ASSESSMENT [name] is a 61 y.o. 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 Now off cardene gtt.   Patient with resultant left hemiparesis and lethargy. Therapies are recommending inpatient rehabilitation; famiy now agreeable to CIR here with his  planned return with them to [place] following; however, rehab will not accept. Back to SNF placement bed search. Remains medically ready for discharge.  -5 sec staring spell last night reported. EEG ordered. No other episodes. Doubt seizure. Most likely associated with ongoing lethargy. -lethargy much improved though not resolved. Waxes and wanes.probably altered sleep wake cycle- Plan ti start ritalin in am and hynotic at night -Neurosurgery consulted for possible ventriculostomy but felt not to be a candidate. Stable from NS standpoinndt. -Malignant hypertension. Off cardene. Started PO medications 8/21.  But limited po intake due to sleepiness. He is on norvasc 10mg  qday, HCTZ 25mg  qday, clonidine 0.1 mg patch and  Lisinopril 20mg  qam. Overall BP improving. -rhabdomyolysis, found down. CK high on admission. Resolved. -leukocytosis, resolved -hypokalemia, replaced, today 3.4 with new addition of HCTZ this admission. Will add daily low dose K replacement. Recommend OP f/u  Hospital day # 10  TREATMENT/PLAN -SNF, patient medically ready for discharge -monitor potassium as an OP -Trial of ritalin in am  for improving wakefulness and ambien for sleep at night Anthony.  Pearlean Brownie had long discussion with brother-in-law and sister related to diagnosis and plan of care.  Annie Main, MSN, RN, ANVP-BC, ANP-BC, [facility] Pager: [phone] 11/09/2011 9:48 AM  Scribe for Anthony. Delia Heady, Stroke Center Medical Director, who has personally reviewed chart, pertinent data, examined the patient and developed the plan of care. Pager:  [phone]

## 2011-11-10 LAB — BASIC METABOLIC PANEL
Calcium: 10.7 mg/dL — ABNORMAL HIGH (ref 8.4–10.5)
Creatinine, Ser: 1.15 mg/dL (ref 0.50–1.35)
GFR calc Af Amer: 79 mL/min — ABNORMAL LOW (ref >90)

## 2011-11-10 LAB — CBC
MCV: 85.7 fL (ref 78.0–100.0)
Platelets: 426 10*3/uL — ABNORMAL HIGH (ref 150–400)
RDW: 12.5 % (ref 11.5–15.5)
WBC: 11.1 10*3/uL — ABNORMAL HIGH (ref 4.0–10.5)

## 2011-11-10 MED ORDER — METHYLPHENIDATE HCL 5 MG PO TABS
5.0000 mg | ORAL_TABLET | Freq: Every day | ORAL | Status: DC
  Administered 2011-11-10 – 2011-11-14 (×5): 5 mg via ORAL
  Filled 2011-11-10 (×6): qty 1

## 2011-11-10 MED ORDER — METHYLPHENIDATE HCL 5 MG PO TABS
5.0000 mg | ORAL_TABLET | Freq: Every day | ORAL | Status: DC
  Administered 2011-11-11 – 2011-11-14 (×4): 5 mg via ORAL
  Filled 2011-11-10 (×3): qty 1

## 2011-11-10 NOTE — Progress Notes (Addendum)
Coverage for 4N  Patient has been accepted and will be admitted to CIR on Tuesday of next week. Patient is still medically receiving treatment and is drowsy. MD reports, will keep him through the weekend to continue medical treatment and medications. PT/OT to be seeing him over the weekend for continue therapy notes and updated notes to be sent to [facility] per their request. Called rehab and made clear of needs and follow up over weekend.  Due to holiday on Monday, patient to remain in hospital and notes to be sent to [facility] for review and authorization. [employer] Marylu Lund to call [employer] and update regarding disposition. Marylu Lund: [phone] (phone) can leave message. Will not be in office on Monday due to Holiday. Fax number to send clinicals: [phone]  Treatment team aware of disposition and agreeable. Insurance is aware of dispostion and agreeable.  Will follow up on Monday with PT/OT notes to send to [facility] for authorization purposes.  Ashley Jacobs, MSW LCSW [phone]

## 2011-11-10 NOTE — Progress Notes (Signed)
Nutrition Follow-up  Intervention:    D/C Ensure Complete and Ensure Pudding Magic cup TID between meals, each supplement provides 290 kcal and 9 grams of protein.  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 Hemorrhage felt to be due to malignant HTN. Pt with left hemiparesis and lethargy. Per MD medically ready for discharge, SNF vs rehab.  Family at bedside and report that they are usually there for meals. They report that they try to forcefully encourage pt to eat. Pt answers are inappropriate. He states "I need to crap".  LBM: 8/27 per RN, currently on stool softeners. RN aware.   Diet Order:  Dysphagia 3/Thin Meal completion </= 50%  Meds: Scheduled Meds:   . amLODipine  10 mg Oral Daily  . antiseptic oral rinse  15 mL Mouth Rinse BID  . cloNIDine  0.1 mg Transdermal Weekly  . enoxaparin (LOVENOX) injection  40 mg Subcutaneous Q24H  . feeding supplement  237 mL Oral TID BM  . feeding supplement  1 Container Oral TID BM  . hydrochlorothiazide  25 mg Oral Daily  . lisinopril  20 mg Oral Daily  . methylphenidate  5 mg Oral Q breakfast  . pantoprazole  20 mg Oral Q1200  . potassium chloride  10 mEq Oral Daily  . senna-docusate  1 tablet Oral BID  . DISCONTD: methylphenidate  10 mg Oral Daily   Continuous Infusions:  PRN Meds:.acetaminophen, hydrALAZINE, labetalol, ondansetron (ZOFRAN) IV, zolpidem, DISCONTD: zolpidem  Labs:  CMP     Component Value Date/Time   NA 138 11/07/2011 1040   K 3.4* 11/07/2011 1040   CL 98 11/07/2011 1040   CO2 29 11/07/2011 1040   GLUCOSE 142* 11/07/2011 1040   BUN 15 11/07/2011 1040   CREATININE 0.95 11/07/2011 1040   CALCIUM 10.1 11/07/2011 1040   PROT 8.0 10/30/2011 2255   ALBUMIN 4.6 10/30/2011 2255   AST 46* 10/30/2011 2255   ALT 28 10/30/2011 2255   ALKPHOS 55 10/30/2011 2255   BILITOT 1.5* 10/30/2011 2255   GFRNONAA 89* 11/07/2011 1040   GFRAA >90  11/07/2011 1040     Intake/Output Summary (Last 24 hours) at 11/10/11 1052 Last data filed at 11/09/11 2000  Gross per 24 hour  Intake      0 ml  Output    900 ml  Net   -900 ml    Weight Status:   171 lbs 8/25 167 lbs 8/20  Re-estimated needs:  Kcal: 1900-2000 Protein: 90-105 grams  Nutrition Dx:  Inadequate oral intake r/t decreased alertness AEB meal completion </= 50%; ongoing.  Goal: Pt to meet >/= 90% of their estimated nutrition needs; not met  Monitor:  PO intake, weight   Kendell Bane RD, LDN, CNSC [phone] Pager [phone] After Hours Pager

## 2011-11-10 NOTE — Progress Notes (Addendum)
Speech Language Pathology Treatment Patient Details Name: Anthony Hardin MRN: [id number] DOB: August 30, 1950 Today's Date: 11/10/2011 Time: 7829-5621 SLP Time Calculation (min): 28 min  Assessment / Plan / Recommendation Clinical Impression  SLP provided dysphagia and cognitive treatment during breakfast.  He required total verbal/tactile cues for arousal total (hand over hand) assist with cup/utensils 90% of the session.  No s/s aspiration and pt. independently using tongue sweep to clear food in buccal cavity.  Anthony Hardin sustained attention to feeding task for approximately 30-60 seconds.  He demonstrated emergent /intellectual awareness with  unpromted statement of "My attention is a problem."  Pt. is making good progress towards goals and will continue to benefit from intensive cognitive therapy.    SLP Plan  Continue with current plan of care       SLP Goals  SLP Goals Potential to Achieve Goals: Good SLP Goal #1: Patient will sustain attention to basic functional ADL with min verbal cues.  SLP Goal #1 - Progress: Progressing toward goal SLP Goal #2: Patient will attend to left visual field during basic functional ADLs with min visual cues SLP Goal #2 - Progress: Progressing toward goal SLP Goal #3: Patient will initiate carryout of basic functional and familiar ADL with no more than 2 verbal or visual cues.  SLP Goal #3 - Progress: Progressing toward goal SLP Goal #4: Patient will demonstrate basic emergent awareness of problems as related to acute deficits during functional ADLs with mod verbal cues. SLP Goal #4 - Progress: Progressing toward goal  General Respiratory Status: Room air Behavior/Cognition: Lethargic;Requires cueing;Decreased sustained attention Oral Cavity - Dentition: Adequate natural dentition Patient Positioning: Upright in bed  Oral Cavity - Oral Hygiene Does patient have any of the following "at risk" factors?: Other - dysphagia Brush patient's teeth BID with  toothbrush (using toothpaste with fluoride): Yes Patient is HIGH RISK - Oral Care Protocol followed (see row info): Yes   Treatment Treatment focused on: Cognition (dysphagia) Skilled Treatment: see clinical impression statement; max tactile/verbal cues for arousal       Anthony Hardin Anthony Hardin M.Ed [employer] [phone]  11/10/2011

## 2011-11-10 NOTE — Progress Notes (Signed)
Occupational Therapy Treatment Patient Details Name: Anthony Hardin MRN: [id number] DOB: September 19, 1950 Today's Date: 11/10/2011 Time: 6213-0865 OT Time Calculation (min): 68 min  OT Assessment / Plan / Recommendation Comments on Treatment Session Pt. maintained arrousal throughout session.  Pt. moving initiated need for toileting although was not successful with efforts.  Pt. with improved functional mobility, but continues with difficulty initiating activity.  Brother present and asking a lot of questions.  Questions were answered    Follow Up Recommendations  Inpatient Rehab    Barriers to Discharge       Equipment Recommendations  Defer to next venue    Recommendations for Other Services Rehab consult  Frequency Min 2X/week   Plan Discharge plan remains appropriate    Precautions / Restrictions Precautions Precautions: Fall Precaution Comments: Pt. with Lt. neglect Restrictions Weight Bearing Restrictions: No   Pertinent Vitals/Pain     ADL  Toilet Transfer: Performed;+2 Total assistance Toilet Transfer: Patient Percentage: 50% Toilet Transfer Method: Stand pivot Acupuncturist: Bedside commode Toileting - Clothing Manipulation and Hygiene: Performed;+1 Total assistance Where Assessed - Glass blower/designer Manipulation and Hygiene: Standing Transfers/Ambulation Related to ADLs: Total A +2 (pt. 50-60%) for transfer to [facility] with commode on pt. Rt.  Tranferring to BSC, pt. performed well, until he needed to sit, and then unable to initiate sitting.  As pt. attempted to initiate sitting, pt. note to begin to push to Lt. Faciliatation provided at hips and UE. Transferring to recliner from [facility], pt. performed with mod A and pt. brother stabilized BSC.  Pt. required increased time to pivot ADL Comments: Pt. finishing up with PT upon therapist's enterance.  Pt's brother and brother in law present and brother asking several questions re: pt. Function, CVA, recovery, etc.   Questions answered.  Instructed brother to keep instructions simple, to provide 15-30 seconds for pt to process and initiate movement before asking pt any other questions, or talking to pt.  Encouraged them to engage pt. asking what he prefers and allowing him time to respond.  Pt. with a multitude of visitors and discussed with brother that pt. may need down time - explained the amount of energy pt. requires to initiate and maintain conversation, and this takes significant effort on pt's part.  Pt. transferred to [facility] due to pt indicating that he had to urinate and possibly have BM.  Pt. sat on BSC with min guard assistance.  (Donned Rt. sock with max A; doffed independently)    OT Diagnosis:    OT Problem List:   OT Treatment Interventions:     OT Goals Acute Rehab OT Goals OT Goal Formulation: With patient Time For Goal Achievement: 11/17/11 ADL Goals Pt Will Perform Grooming: with min assist;Supported;Sitting, edge of bed ADL Goal: Grooming - Progress: Progressing toward goals Pt Will Perform Upper Body Bathing: with mod assist;Sitting, chair;Supported ADL Goal: Product manager - Progress: Goal set today Pt Will Transfer to Toilet: with mod assist;3-in-1;Stand pivot transfer ADL Goal: Toilet Transfer - Progress: Progressing toward goals Additional ADL Goal #1: Pt will sustain attention for 10 mins during simple ADL tasks ADL Goal: Additional Goal #1 - Progress: Updated due to goal met Additional ADL Goal #2: Pt. will use Lt. UE as a support with min A ADL Goal: Additional Goal #2 - Progress: Progressing toward goals Miscellaneous OT Goals Miscellaneous OT Goal #1: Pt will perform bed mobility at Min (A) level as precursor to adls OT Goal: Miscellaneous Goal #1 - Progress: Progressing toward  goals Miscellaneous OT Goal #2: Pt will located 2 out 4 objects (50%) on Lt side with only verbal request to decrease Lt neglect OT Goal: Miscellaneous Goal #2 - Progress: Progressing toward  goals OT Goal: Miscellaneous Goal #3 - Progress: Discontinued (comment)  Visit Information  Last OT Received On: 11/10/11 Assistance Needed: +2 (for transfer)    Subjective Data      Prior Functioning       Cognition  Overall Cognitive Status: Impaired Area of Impairment: Attention;Following commands;Problem solving;Awareness of deficits;Awareness of errors Arousal/Alertness: Awake/alert Orientation Level: Oriented X4 / Intact Behavior During Session: Restless Current Attention Level: Focused;Sustained Attention - Other Comments: Difficult to accurately gauge level of attention due to pt. with significant deficits with initiation.  Unsure if pt. truly losing focus, or if he just can't initiate the movement/activity requested Following Commands: Follows one step commands consistently Awareness of Errors: Assistance required to identify errors made Problem Solving: difficult to accurately asses due to severity of initiation deficits    Mobility  Shoulder Instructions Transfers Transfers: Sit to Stand;Stand to Sit Sit to Stand: 3: Mod assist;From chair/3-in-1;With upper extremity assist Stand to Sit: 1: +2 Total assist;To chair/3-in-1 Stand to Sit: Patient Percentage: 40% Details for Transfer Assistance: Pt with significant difficulty initiating stand to sit       Exercises      Balance     End of Session OT - End of Session Activity Tolerance: Patient tolerated treatment well Patient left: in chair;with call bell/phone within reach;with family/visitor present Nurse Communication: Mobility status  GO     Delta Deshmukh, [name] 11/10/2011, 12:58 PM

## 2011-11-10 NOTE — PMR Pre-admission (Signed)
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 right-handed male with history of hypertension. 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 Admitted 10/31/2011 with diffuse weakness and unable to move and fell out of bed onto the floor. MRI of the brain showed a 4 x 1 x 2.5 cm acute hematoma right thalamus with mass effect and displacement distortion of the third ventricle but no suspicion of obstructive hydrocephalus. MRA of the head with no stenosis or occlusion. Close monitoring of blood pressure with intravenous Cardene. Neurology followup with workup ongoing. Noted bouts of diplopia as well as left inattention.  Subcutaneous Lovenox was initiated 10/31/2011 for DVT prophylaxis. Followup speech therapy for questionable dysphagia maintain on mechanical soft diet with thin liquids.  Patient with poor level of alertness at times.  Has to be stimulated.  Wakes up for short periods of time.  Recognizes visitors and carries on conversations, but then fades out again.  Started on ritalin to see if he can stay awake longer and participate more fully.  9/3 More awake and alert. Intermittently confused and inattentive. Total: 13 =NIH  Past Medical History  Past Medical History  Diagnosis Date  . Hypertension     Family History  family history is not on file.  Prior Rehab/Hospitalizations:  No previous rehab admissions.   Current Medications  Current facility-administered medications:acetaminophen (TYLENOL) tablet 650 mg, 650 mg, Oral, Q4H PRN, Thana Farr, MD, 650 mg at 11/14/11 0215;  amLODipine (NORVASC) tablet 10 mg, 10 mg, Oral, Daily, Layne Benton, NP, 10 mg at 11/13/11 1016;  antiseptic oral rinse (BIOTENE) solution 15 mL, 15 mL, Mouth Rinse, BID, Micki Riley, MD, 15 mL at 11/13/11 2159 cloNIDine (CATAPRES - Dosed in mg/24 hr) patch 0.1 mg, 0.1 mg, Transdermal, Weekly, Layne Benton, NP, 0.1 mg at 11/10/11 0945;  enoxaparin (LOVENOX) injection 40 mg, 40 mg, Subcutaneous, Q24H,  Layne Benton, NP, 40 mg at 11/13/11 1017;  hydrALAZINE (APRESOLINE) injection 20 mg, 20 mg, Intravenous, Q6H PRN, Micki Riley, MD, 20 mg at 11/10/11 0343 hydrochlorothiazide (HYDRODIURIL) tablet 25 mg, 25 mg, Oral, Daily, Layne Benton, NP, 25 mg at 11/13/11 1016;  labetalol (NORMODYNE,TRANDATE) injection 10-20 mg, 10-20 mg, Intravenous, Q10 min PRN, Layne Benton, NP;  lisinopril (PRINIVIL,ZESTRIL) tablet 10 mg, 10 mg, Oral, QHS, Levert Feinstein, MD, 10 mg at 11/13/11 2151;  lisinopril (PRINIVIL,ZESTRIL) tablet 20 mg, 20 mg, Oral, Daily, Levert Feinstein, MD, 20 mg at 11/13/11 1016 methylphenidate (RITALIN) tablet 5 mg, 5 mg, Oral,  Q0600, Layne Benton, NP, 5 mg at 11/14/11 0544;  methylphenidate (RITALIN) tablet 5 mg, 5 mg, Oral, Daily, Layne Benton, NP, 5 mg at 11/13/11 1000;  ondansetron (ZOFRAN) injection 4 mg, 4 mg, Intravenous, Q6H PRN, Thana Farr, MD;  pantoprazole (PROTONIX) EC tablet 20 mg, 20 mg, Oral, Q1200, Micki Riley, MD, 20 mg at 11/12/11 1114 potassium chloride (K-DUR,KLOR-CON) CR tablet 10 mEq, 10 mEq, Oral, Daily, Layne Benton, NP, 10 mEq at 11/13/11 1016;  senna-docusate (Senokot-S) tablet 1 tablet, 1 tablet, Oral, BID, Thana Farr, MD, 1 tablet at 11/13/11 2148;  zolpidem (AMBIEN) tablet 5 mg, 5 mg, Oral, QHS PRN, Micki Riley, MD, 5 mg at 11/13/11 2151  Patients Current Diet: Dysphagia Dysphagia 3 diet with thin liquids with full supervision needed  Precautions / Restrictions Precautions Precautions: Fall Precaution Comments: Patient with L neglect Restrictions Weight Bearing Restrictions: No   Prior Activity Level Community (5-7x/wk): 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  Played tennis 2-3 X a week.  Involved in rotary.  Very active and went out daily.  Home Assistive Devices / Equipment Home Assistive Devices/Equipment: None Home Adaptive Equipment: Hand-held shower hose  Prior Functional Level Prior Function Level of Independence: Independent Able to Take Stairs?: Yes Driving: Yes Vocation: Full time employment  Current Functional Level Cognition  Arousal/Alertness: Awake/alert Overall Cognitive Status: Impaired Overall Cognitive Status: Impaired Current Attention Level: Sustained Attention - Other Comments: Attention very difficult to accurately asses due to significance of motor planning and initiation deficits.  Pt. with sometimes a 30-45 second delay and will at times appear that he has lost focus/attention only to execute movement, or answer question asked. Orientation Level: Oriented X4 Following Commands: Follows one step commands with increased  time Safety/Judgement: Decreased awareness of safety precautions Awareness of Errors: Assistance required to identify errors made;Assistance required to correct errors made Awareness of Errors - Other Comments: Pt. is able to verbalize deficits, but unable to indicate how they affect him functionally Awareness of Deficits: Patient starting to gain concept of his deficits.  Cognition - Other Comments: Pt. maintained arrousal throughout entire session Attention: Focused;Sustained Focused Attention: Appears intact Sustained Attention: Impaired Sustained Attention Impairment: Verbal basic;Functional basic Memory: Appears intact (long term and basic short term appears intact) Awareness: Impaired Awareness Impairment: Emergent impairment;Intellectual impairment (albe to verbalize deficits but not how they impact function) Problem Solving: Impaired Problem Solving Impairment: Functional basic Executive Function: Initiating;Self Monitoring;Self Correcting Initiating: Impaired Initiating Impairment: Functional basic Self Monitoring: Impaired Self Monitoring Impairment: Functional basic Self Correcting: Impaired Self Correcting Impairment: Functional basic Behaviors: Impulsive Safety/Judgment: Impaired Comments: decreased safety awareness    Extremity Assessment (includes Sensation/Coordination)  RUE ROM/Strength/Tone: Within functional levels RUE Sensation: WFL - Light Touch RUE Coordination: WFL - gross/fine motor  RLE ROM/Strength/Tone: Within functional levels RLE Sensation: WFL - Light Touch RLE Coordination: WFL -  gross/fine motor    ADLs  Eating/Feeding: Maximal assistance (To drink from cup with Lt (hemi) UE) Where Assessed - Eating/Feeding: Bed level Grooming: Performed;Shaving;Wash/dry face;Maximal assistance;Minimal assistance (see adl section for details) Where Assessed - Grooming: Supported sitting Lower Body Dressing: Minimal assistance (to don Rt. sock and max  instructional cues) Where Assessed - Lower Body Dressing: Supine, head of bed up Toilet Transfer: Simulated;+2 Total assistance Toilet Transfer: Patient Percentage: 60% Toilet Transfer Method: Stand pivot;Sit to Barista: Other (comment) (to recliner) Toileting - Clothing Manipulation and Hygiene: Simulated;+1 Total assistance Toileting - Clothing Manipulation and Hygiene: Patient Percentage: 10% Where Assessed - Toileting Clothing Manipulation and Hygiene: Standing Equipment Used: Gait belt Transfers/Ambulation Related to ADLs: Total A +2 for transfer bed to recliner.  Pt. 40-70%.   Pt. "gets stuck" in middle of transfer, and has difficulty re-initiating and motor planning movement.  During that time, pt. pushes heavily to Lt. until he is able to reinitiate activity, then he is able to perform ~70%.   ADL Comments: Pt recognized therapist upon entry.  He was able to recall earlier session and what we worked on.  He was also able to recall conversation in regards to CVA and deficits.   Pt. noted with spontaneous thumb extension this pm when discussing Lt UE.  Also able to release cylindrical object with full active extension of digits Lt. hand after signficant delay.    With person on his Rt, pt. able to attend to therapist on his Lt. with minimal cuing    Mobility  Bed Mobility: Supine to Sit Rolling Right: 2: Max assist Right Sidelying to Sit: 2: Max assist Supine to Sit: 3: Mod assist Sitting - Scoot to Edge of Bed: 4: Min guard Sit to Supine: 1: +2 Total assist;HOB flat Sit to Supine: Patient Percentage: 40% Sit to Sidelying Right: 4: Min assist (increased time, and faciliation of Lt. LE)    Transfers  Transfers: Sit to Stand;Stand to [employer] Transfers Sit to Stand: 3: Mod assist;With upper extremity assist;From bed Sit to Stand: Patient Percentage: 70% Stand to Sit: 3: Mod assist;With upper extremity assist;To chair/3-in-1 Stand to Sit: Patient  Percentage: 70% Stand Pivot Transfers: 1: +2 Total assist Stand Pivot Transfers: Patient Percentage: 40%    Ambulation / Gait / Stairs / Psychologist, prison and probation services  Ambulation/Gait Ambulation/Gait Assistance: Not tested (comment) Stairs: No Wheelchair Mobility Wheelchair Mobility: No    Posture / Games developer Sitting - Balance Support: No upper extremity supported;Feet supported Static Sitting - Level of Assistance: 5: Stand by assistance Static Sitting - Comment/# of Minutes: Patient able to sit EOB x10 minutes while working on multitasking. Able to find midline without much difficulty this session Static Standing Balance Static Standing - Balance Support: Right upper extremity supported Static Standing - Level of Assistance: 1: +2 Total assist Static Standing - Comment/# of Minutes: Patient stood x5 min while weight shifting and working on midline posture and support. A for L knee extension. About 25% of time patient able to support L knee from buckling.    Very impulsive with decreased safety awareness  Previous Home Environment Living Arrangements: Alone Lives With: Alone Available Help at Discharge: Other (Comment) Type of Home: House Home Layout: Two level;Able to live on main level with bedroom/bathroom;1/2 bath on main level Home Access: Stairs to enter Entrance Stairs-Rails: Right Entrance Stairs-Number of Steps: 2 Bathroom Shower/Tub: Tub/shower unit;Curtain Firefighter: Standard Home Care Services: No  Discharge Living Setting Plans  for Discharge Living Setting: Patient's home;House Type of Home at Discharge: House Discharge Home Layout: Two level;Able to live on main level with bedroom/bathroom Discharge Home Access: Stairs to enter Entrance Stairs-Number of Steps: 4 Do you have any problems obtaining your medications?: No 9/2 I discussed with both sister and brother likely progress. Will make gains at [facility] but will still need min assist 24/7  care by family . Did not need to hire medical personnel for his care. Sister discussed possibility of pt to move to [place] to be with family and care to be arranged. Brother discussed may need SNF placement or ALF placement in [place] until the holidays. Numerous questions concerning final d/c disposition locally or in [place] and what that would look like. Discussed with brother that paying privately for pt to get continued inpt rehab care not typical. Once inpt rehab stage complete, typical next venue of care is home with hh and then progress to outpatient therapy.  Social/Family/Support Systems Patient Roles: Other (Comment) (Has a sister and a brother.) Contact Information: Mignon Pine - sister (c) [phone]; Reef Achterberg - brother (c) [phone]; Caryl Asp - friend [phone] Anticipated Caregiver: Sister, brother, and brother-in-law Ability/Limitations of Caregiver: Family works.  Will need to see how much progress patient can make. Caregiver Availability: Other (Comment) (Family will work on getting help as needed.) Discharge Plan Discussed with Primary Caregiver: Yes Is Caregiver In Agreement with Plan?: Yes Does Caregiver/Family have Issues with Lodging/Transportation while Pt is in Rehab?: No  Goals/Additional Needs Patient/Family Goal for Rehab: PT/OT min A, ST S goals Expected length of stay: 4 weeks Cultural Considerations: Christian Dietary Needs: Dys 3, thin liquids Equipment Needs: TBD Pt/Family Agrees to Admission and willing to participate: Yes Program Orientation Provided & Reviewed with Pt/Caregiver Including Roles  & Responsibilities: Yes  Patient Condition: Please see physician update to information in consult dated 11/01/11.  Preadmission Screen Completed By:  Clois Dupes, 11/14/2011 9:30 AM ______________________________________________________________________   Discussed status with Dr. Wynn Banker on 11/14/11 at 0830 and received telephone approval for  admission today. I also notified Dr. Riley Kill on 11/14/11 at 0930.  Admission Coordinator:  Clois Dupes, time 5784 Date 11/14/11/

## 2011-11-10 NOTE — Progress Notes (Signed)
Occupational Therapy Note  11/10/11 1700  OT Visit Information  Last OT Received On 11/10/11  Assistance Needed +2 (for transfers)  OT Time Calculation  OT Start Time 1636  OT Stop Time 1710  OT Time Calculation (min) 34 min  Precautions  Precautions Fall  Precaution Comments Patient with L neglect  Restrictions  Weight Bearing Restrictions No  ADL  ADL Comments Attempted to see pt. for self feeding; however, pt too fatigued this evening to attempt.  Instructed pt's brother how to cue and assist pt. over weekend with feeding tasks, in attempts of increasing independence with this.  Pt. able to locate Lt. UE on command with min tactile cues. Pt. able to recall 2 events of the day with increased time to generate information, and was able to name three visitors who visited today, with increased time to generate information.  Pt's brother reports pt. awake most of the day, and with multiple visitors throughout the day.  Pt. able to hold full conversations with several of them, per brother.  Instruted brother to allow pt. rest times, and to build that into his day, to present information simply and with single commands.  All questions were answered  Cognition  Overall Cognitive Status Impaired  Area of Impairment Attention;Following commands;Problem solving;Awareness of deficits;Awareness of errors  Arousal/Alertness Lethargic  Behavior During Session Lethargic  Current Attention Level Focused;Sustained  Attention - Other Comments Difficult to accurately gauge level of attention due to pt. with significant deficits with initiation.  Unsure if pt. truly losing focus, or if he just can't initiate the movement/activity requested  Following Commands Follows one step commands inconsistently  OT - End of Session  Activity Tolerance Patient limited by fatigue  Patient left in bed;with call bell/phone within reach;with bed alarm set  OT Assessment/Plan  Comments on Treatment Session Pt very fatigued  this pm; however, brother reports he has been awake and engaged most of the day.  Attempted feeding with pt, but pt too fatigued.  Did instruct pt's brother how to faciliate, cue and assist pt wtih this over the weekend.  Pt. able to recall information from today when given increased time to generate answers.  Pt making steady improvements  OT Plan Discharge plan remains appropriate  OT Frequency Min 2X/week  Recommendations for Other Services Rehab consult  Follow Up Recommendations Inpatient Rehab  Equipment Recommended Defer to next venue  Acute Rehab OT Goals  OT Goal Formulation With patient  Time For Goal Achievement 11/17/11  Potential to Achieve Goals Good  ADL Goals  Pt Will Perform Eating with mod assist;Supported;Sitting, chair;Supine, head of bed up  ADL Goal: Eating - Progress Goal set today    Jeani Hawking, OTR/L [phone]

## 2011-11-10 NOTE — Progress Notes (Signed)
Physical Therapy Treatment Patient Details Name: Anthony Hardin MRN: [id number] DOB: May 04, 1950 Today's Date: 11/10/2011 Time: 1030-1103 PT Time Calculation (min): 33 min  PT Assessment / Plan / Recommendation Comments on Treatment Session  Patient still tends to be limited by arousal at some points. He was able to participate more today when sat up and given stimuli. Patient continues to progress with transfers and activity tolerance. Patient appearing more fatiqued during PT session than with OT as he was able to transfer wtih Mod A with OT. Will continue with current POC and patient plans to be admitted to CIR on Tuesday of next week.     Follow Up Recommendations  Inpatient Rehab    Barriers to Discharge        Equipment Recommendations  Defer to next venue    Recommendations for Other Services    Frequency Min 4X/week   Plan Discharge plan remains appropriate;Frequency remains appropriate    Precautions / Restrictions Precautions Precautions: Fall Precaution Comments: Patient with L neglect Restrictions Weight Bearing Restrictions: No   Pertinent Vitals/Pain     Mobility  Bed Mobility Rolling Right: 3: Mod assist Right Sidelying to Sit: 2: Max assist Details for Bed Mobility Assistance: Patient requiring A to position LE out of bed and assistance to keep LE out of bed as patient attempting to lay back down. A with shoulder/trunk elevation into sitting position. Cues for technique and positioning Transfers Transfers: Stand Pivot Transfers Sit to Stand: 1: +2 Total assist Sit to Stand: Patient Percentage: 60% Stand to Sit: 1: +2 Total assist Stand to Sit: Patient Percentage: 60% Stand Pivot Transfers: Patient Percentage: 60% Details for Transfer Assistance: Patient able to stand x3 requring less assistance for each stand. Patient having difficulting initiating stand and sit. Tacitle and manual cues to extend knees and hips with stand. A to control descent      Exercises     PT Diagnosis:    PT Problem List:   PT Treatment Interventions:     PT Goals Acute Rehab PT Goals PT Goal: Rolling Supine to Right Side - Progress: Progressing toward goal PT Goal: Rolling Supine to Left Side - Progress: Progressing toward goal PT Goal: Sit to Stand - Progress: Progressing toward goal PT Transfer Goal: Bed to Chair/Chair to Bed - Progress: Progressing toward goal PT Goal: Ambulate - Progress: Progressing toward goal  Visit Information  Last PT Received On: 11/10/11 Assistance Needed: +2    Subjective Data      Cognition  Overall Cognitive Status: Impaired Area of Impairment: Attention;Following commands;Problem solving;Awareness of deficits;Awareness of errors Arousal/Alertness: Awake/alert Orientation Level: Oriented X4 / Intact Behavior During Session: Restless Current Attention Level: Focused;Sustained Attention - Other Comments: Difficult to accurately gauge level of attention due to pt. with significant deficits with initiation.  Unsure if pt. truly losing focus, or if he just can't initiate the movement/activity requested Following Commands: Follows one step commands inconsistently Awareness of Errors: Assistance required to identify errors made Problem Solving: difficult to accurately asses due to severity of initiation deficits    Balance  Static Sitting Balance Static Sitting - Balance Support: Right upper extremity supported;Feet supported Static Sitting - Level of Assistance: 4: Min assist Static Sitting - Comment/# of Minutes: Patient able to sit EOB 6 minute with min A for midline posture. Patient with tendency to lean posteriorly.  Static Standing Balance Static Standing - Balance Support: Right upper extremity supported Static Standing - Level of Assistance: 1: +2 Total assist Static  Standing - Comment/# of Minutes: Patient stood x3 x30sec with +2 A for balance and to facilitate upright posture and minimal weight shifting with A  to block LLE  End of Session PT - End of Session Equipment Utilized During Treatment: Gait belt Activity Tolerance: Patient tolerated treatment well;Other (comment) (limited by arousal) Patient left: in chair Nurse Communication: Mobility status   GP     Anthony Hardin 11/10/2011, 1:15 PM  11/10/2011 Anthony Hardin PTA [phone] pager [phone] office

## 2011-11-10 NOTE — Progress Notes (Signed)
Rehab admissions - Continuing to follow.  Dr. Pearlean Brownie and Dr. Wynn Banker have been discussing the need for inpatient rehab admission.  I spoke with case Production designer, theatre/television/film at [employer], [facility].  We need to see how patient does with therapies over the weekend, fax to [facility] of Monday, and anticipate admit to inpatient rehab on Tuesday 09/03 if all goes as planned.  [employer] prefers that patient admit to inpatient rehab for 2 to 3 weeks and then see how he is progressing.  If SNF is needed after an inpatient rehab stay, we will discuss with [employer] at that time.  Call me for questions.  #[phone]

## 2011-11-10 NOTE — Progress Notes (Signed)
Stroke Team Progress Note  HISTORY Anthony Hardin is an 61 y.o. male who was last seen normal on 8/16 after work. Patient reports that he began to feel numbness and weakness on his left side on Saturday. Reports that he was unable to move and in attempting to move fell out of the bed and onto the floor. Was unable to get off the floor and was found by co-workers 08/19. EMS was called and patient was brought in for evaluation. 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  Surrounding edema and mild mass effect without midline shift.   SUBJECTIVE Brother and brother-in-law at bedside. Pt sleepy. Did not get ambien last night. Has not yet received ritalin this am. Rehab vs SNF decision pending. Family feels patient is a hospital patient and should not be discharged.  OBJECTIVE Most recent Vital Signs: Filed Vitals:   11/09/11 2230 11/10/11 0304 11/10/11 0447 11/10/11 0626  BP: 159/66 176/105 158/68 139/62  Pulse: 66 66  67  Temp: 98.4 F (36.9 C) 97.9 F (36.6 C)  98.3 F (36.8 C)  TempSrc: Oral Oral  Axillary  Resp: 18 18  16   Height:      Weight:      SpO2: 97% 100%  98%   Intake/Output from previous day: 08/29 0701 - 08/30 0700 In: 120 [P.O.:120] Out: 900 [Urine:900]  IV Fluid Intake:   MEDICATIONS     . amLODipine  10 mg Oral Daily  . antiseptic oral rinse  15 mL Mouth Rinse BID  . cloNIDine  0.1 mg Transdermal Weekly  . enoxaparin (LOVENOX) injection  40 mg Subcutaneous Q24H  . feeding supplement  237 mL Oral TID BM  . feeding supplement  1 Container Oral TID BM  . hydrochlorothiazide  25 mg Oral Daily  . lisinopril  20 mg Oral Daily  . methylphenidate  5 mg Oral Q breakfast  . pantoprazole  20 mg Oral Q1200  . potassium chloride  10 mEq Oral Daily  . senna-docusate  1 tablet Oral BID  . DISCONTD: methylphenidate  10 mg Oral Daily   PRN:  acetaminophen, hydrALAZINE, labetalol, ondansetron (ZOFRAN) IV,  zolpidem, DISCONTD: zolpidem  Diet:  DYS 3 THIN liquids Activity:  Ambulate  DVT Prophylaxis:  Lovenox 40 mg sq daily   CLINICALLY SIGNIFICANT STUDIES Basic Metabolic Panel:   Lab 11/07/11 1040 11/04/11 0422  NA 138 140  K 3.4* 3.6  CL 98 102  CO2 29 23  GLUCOSE 142* 127*  BUN 15 16  CREATININE 0.95 0.67  CALCIUM 10.1 9.0  MG -- --  PHOS -- --   CBC:   Lab 11/06/11 0400  WBC 9.8  NEUTROABS 6.9  HGB 13.7  HCT 38.1*  MCV 85.2  PLT 274   Urinalysis negatvie  Urine Drug Screen:  normal  CT of the brain 11/04/2011 Stable large hemorrhage involving right thalamus with stable surrounding vasogenic edema. Stable to minimally more prominent mass effect on the third ventricle, with minimal increased dilatation of the third ventricle and temporal horns of the lateral ventricles. Stable bifrontal subarachnoid hemorrhage. 11/03/2011 No significant change seen involving large intraparenchymal hemorrhage in the right thalamus compared to prior exam. There does appear to be a decreased amount of hemorrhage within the right lateral ventricle compared to prior exam. No change is seen involving small amount of hemorrhage in right occipital horn or small amount of subarachnoid hemorrhage noted previously 11/01/2011   Right thalamic hemorrhage  with ventricular extension.  No significant change from 10/30/2011.   10/30/2011   Acute intraparenchymal hemorrhage in the right thalamus extending to the right lateral ventricle.  Surrounding edema and mild mass effect without midline shift.   MRI of the brain 11/01/2011  4.1 x 2.5 cm acute hematoma with the epicenter in the right thalamus as outlined above.  Surrounding edema.  Mass effect with displacement distortion of the third ventricle but no suspicion of obstructive hydrocephalus at this moment.  Small amount of intraventricular blood.   MRA of the brain 11/01/2011   No evidence of high flow vascular malformation.  No large or medium vessel occlusion,  correctable stenosis or aneurysm.     CXR  11/03/2011 Low volume film with mild cardiomegaly. No acute cardiopulmonary finding. 10/31/2011   No acute abnormality.  EKG  NSR.   EEG 11/01/2011 no seziure  Therapy Recommendations PT - CIR; OT -CIR ; ST - CIR.  Physical Exam   Pleasant middle aged Caucasian male not in distress. Afebrile. Head is nontraumatic. Neck is supple without bruit. Hearing is normal. Cardiac exam no murmur or gallop. Lungs are clear to auscultation.  Neurological Exam :  Mental Status: Awake alert and cooperative..  Oriented x 3, thought content appropriate, insight reasonable.  Speech fluent with Frequent circumlocution and minimal dysarthria.  Able to follow 3 step commands without difficulty.  Left neglect persists.  Cranial Nerves: II-partial left homonymous hemianopsia; c/o multiple vision with both eyes open -side-by side III/IV/VI-Extraocular movements intact.  Pupils reactive bilaterally, left pupil slightly larger than right.. V/VII-lower left facial droop VIII-grossly intact IX/X-not assessed XI-unable to shrug left shoulder XII-midline tongue extension Motor: 5/5 RUE/RLE, 0/5 RUE/RLE.  No grip on left. Increased tone LUE/LLE Sensory: no response to noxious stim on left.  Deep Tendon Reflexes: 2+ and symmetric throughout Plantars:upgoing left, downgoing right. bilaterally Cerebellar: not tested.  ASSESSMENT [name] is a 61 y.o. 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 Now off cardene gtt.   Patient with resultant left hemiparesis and lethargy. Therapies are recommending inpatient rehabilitation; decision of SNF vs rehab still pending. Remains medically ready for discharge.  -5 sec staring spell last night reported. EEG ordered. No other episodes. Doubt seizure. EEG discontinued. Most likely associated with  ongoing lethargy/RAS abnormality associated with stroke. -lethargy much improved though not resolved. Waxes and wanes.probably altered sleep wake cycle-  Ritalin started for this morning.  -Neurosurgery consulted for possible ventriculostomy but felt not to be a candidate. Stable from NS standpoint. -Malignant hypertension. Off cardene. Started PO medications 8/21.  But limited po intake due to sleepiness. He is on norvasc 10mg  qday, HCTZ 25mg  qday, clonidine 0.1 mg patch and  Lisinopril 20mg  qam. Overall BP improving. -rhabdomyolysis, found down. CK high on admission. Resolved. -leukocytosis, resolved -hypokalemia, replaced, today 3.4 with new addition of HCTZ this admission. Will add daily low dose K replacement. Recommend OP f/u  Hospital day # 11  TREATMENT/PLAN -will check CBC, BMET -plan trial of ritalin next few days to improve participation in therapies and inpatient rehab next week. -monitor potassium -Trial of ritalin for improving wakefulness and ambien prn for sleep at night  Dr. Pearlean Brownie had long discussion with brother and brother-in-law related to diagnosis and plan of care.  Annie Main, MSN, RN, ANVP-BC, ANP-BC, GNP-BC [facility] Pager: [phone] 11/10/2011 9:51 AM  Scribe for Dr.  Delia Heady, Stroke [employer], who has personally reviewed chart, pertinent data, examined the patient and developed the plan of care. Pager:  [phone]

## 2011-11-10 NOTE — Progress Notes (Signed)
Orthopedic Tech Progress Note Patient Details:  Anthony Hardin Jun 11, 1950 [id number]  Ortho Devices Type of Ortho Device: Other (comment) Ortho Device/Splint Location: prafo boot Ortho Device/Splint Interventions: Application   Cammer, Mickie Bail 11/10/2011, 3:38 PM

## 2011-11-11 NOTE — Progress Notes (Signed)
Stroke Team Progress Note  HISTORY Anthony Hardin is an 61 y.o. male who was last seen normal on 8/16 after work. Patient reports that he began to feel numbness and weakness on his left side on Saturday. Reports that he was unable to move and in attempting to move fell out of the bed and onto the floor. Was unable to get off the floor and was found by co-workers 08/19. EMS was called and patient was brought in for evaluation. 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  Surrounding edema and mild mass effect without midline shift.   SUBJECTIVE Brother and brother-in-law at bedside. Pt sleepy. The patient is up in a chair, indicating no complaints. The patient denies headache, problems with swallowing.  OBJECTIVE Most recent Vital Signs: Filed Vitals:   11/10/11 2200 11/11/11 0200 11/11/11 0600 11/11/11 1041  BP: 160/88 138/56 147/70 144/75  Pulse: 69 67 54 67  Temp: 98.1 F (36.7 C) 98.2 F (36.8 C) 98.2 F (36.8 C) 97.7 F (36.5 C)  TempSrc:    Oral  Resp: 16 16 16 16   Height:      Weight:      SpO2: 95% 97% 98% 97%   Intake/Output from previous day: 08/30 0701 - 08/31 0700 In: -  Out: 700 [Urine:700]  IV Fluid Intake:   MEDICATIONS     . amLODipine  10 mg Oral Daily  . antiseptic oral rinse  15 mL Mouth Rinse BID  . cloNIDine  0.1 mg Transdermal Weekly  . enoxaparin (LOVENOX) injection  40 mg Subcutaneous Q24H  . hydrochlorothiazide  25 mg Oral Daily  . lisinopril  20 mg Oral Daily  . methylphenidate  5 mg Oral Q0600  . methylphenidate  5 mg Oral Daily  . pantoprazole  20 mg Oral Q1200  . potassium chloride  10 mEq Oral Daily  . senna-docusate  1 tablet Oral BID  . DISCONTD: feeding supplement  237 mL Oral TID BM  . DISCONTD: feeding supplement  1 Container Oral TID BM  . DISCONTD: methylphenidate  5 mg Oral Q breakfast   PRN:  acetaminophen, hydrALAZINE, labetalol, ondansetron (ZOFRAN) IV,  zolpidem  Diet:  DYS 3 THIN liquids Activity:  Ambulate  DVT Prophylaxis:  Lovenox 40 mg sq daily   CLINICALLY SIGNIFICANT STUDIES Basic Metabolic Panel:   Lab 11/10/11 1134 11/07/11 1040  NA 132* 138  K 4.1 3.4*  CL 93* 98  CO2 26 29  GLUCOSE 145* 142*  BUN 24* 15  CREATININE 1.15 0.95  CALCIUM 10.7* 10.1  MG -- --  PHOS -- --   CBC:   Lab 11/10/11 1134 11/06/11 0400  WBC 11.1* 9.8  NEUTROABS -- 6.9  HGB 15.8 13.7  HCT 43.9 38.1*  MCV 85.7 85.2  PLT 426* 274   Urinalysis negatvie  Urine Drug Screen:  normal  CT of the brain 11/04/2011 Stable large hemorrhage involving right thalamus with stable surrounding vasogenic edema. Stable to minimally more prominent mass effect on the third ventricle, with minimal increased dilatation of the third ventricle and temporal horns of the lateral ventricles. Stable bifrontal subarachnoid hemorrhage. 11/03/2011 No significant change seen involving large intraparenchymal hemorrhage in the right thalamus compared to prior exam. There does appear to be a decreased amount of hemorrhage within the right lateral ventricle compared to prior exam. No change is seen involving small amount of hemorrhage in right occipital horn or small amount of subarachnoid hemorrhage noted previously  11/01/2011   Right thalamic hemorrhage with ventricular extension.  No significant change from 10/30/2011.   10/30/2011   Acute intraparenchymal hemorrhage in the right thalamus extending to the right lateral ventricle.  Surrounding edema and mild mass effect without midline shift.   MRI of the brain 11/01/2011  4.1 x 2.5 cm acute hematoma with the epicenter in the right thalamus as outlined above.  Surrounding edema.  Mass effect with displacement distortion of the third ventricle but no suspicion of obstructive hydrocephalus at this moment.  Small amount of intraventricular blood.   MRA of the brain 11/01/2011   No evidence of high flow vascular malformation.  No large  or medium vessel occlusion, correctable stenosis or aneurysm.     CXR  11/03/2011 Low volume film with mild cardiomegaly. No acute cardiopulmonary finding. 10/31/2011   No acute abnormality.  EKG  NSR.   EEG 11/01/2011 no seziure  Therapy Recommendations PT - CIR; OT -CIR ; ST - CIR.  Physical Exam   The patient is alert and cooperative at the time of the examination.  Respiratory examination is clear.  Cardiovascular examination reveals a regular rate rhythm, no obvious murmurs or rubs are noted.  Extremities are without significant edema.  The patient has a depression the left nasolabial fold. On primary gaze, there is abduction of the left eye. The patient has incomplete abduction of the left eye with left lateral gaze. The patient reports double vision.  The patient has a left homonymous visual field deficit.  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 The patient has good strength on the right side.  The patient has relative anesthesia of the left arm and left leg to soft touch, pain.  Cerebellar testing was not done.  The patient was not ambulated.      ASSESSMENT [name] is a 61 y.o. 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 Now off cardene gtt.   Patient with resultant left hemiparesis and lethargy. Therapies are recommending inpatient rehabilitation; decision of SNF vs rehab still pending. Remains medically ready for discharge.  -Neurosurgery consulted for possible ventriculostomy but felt not to be a candidate. Stable from NS standpoint. -Malignant hypertension. Off cardene. Started PO medications 8/21.  But limited po intake due to sleepiness. He is on norvasc 10mg  qday, HCTZ 25mg  qday, clonidine 0.1 mg patch and  Lisinopril 20mg  qam. Overall BP improving. -rhabdomyolysis, found down. CK high on  admission. Resolved.  Recommend OP f/u  Hospital day # 12  TREATMENT/PLAN -plan rehab next week -ongoing PT, OT, ST   11/11/2011 11:46 AM  Lesly Dukes  Pager:  [phone]

## 2011-11-11 NOTE — Progress Notes (Addendum)
Occupational Therapy Treatment Patient Details Name: Anthony Hardin MRN: [id number] DOB: [birth year] Today's Date: 11/11/2011 Time: 7829-5621 OT Time Calculation (min): 47 min  OT Assessment / Plan / Recommendation Comments on Treatment Session Pt is progressing well with demonstration of sustanined attention this session. Pt wrote name, don glasses, recalled events in session and shaved face.     Follow Up Recommendations  Inpatient Rehab    Barriers to Discharge       Equipment Recommendations  Defer to next venue    Recommendations for Other Services Rehab consult  Frequency Min 2X/week   Plan Discharge plan remains appropriate    Precautions / Restrictions Precautions Precautions: Fall Precaution Comments: Patient with L neglect Restrictions Weight Bearing Restrictions: No   Pertinent Vitals/Pain None I am tired    ADL  Grooming: Performed;Shaving;Wash/dry face;Maximal assistance;Minimal assistance (see adl section for details) Where Assessed - Grooming: Supported sitting ADL Comments: Pt in chair on arrival and scanning environment visually with name call by OT. Pt aroused and demonstrating sustained attention.Ot with questioning cue "Anthony Hardin who is this person?" Brother stating to patient left side. Anthony Hardin states "Satan reincarnated" I said "who is it?" Pt states "my brother" Pt scans to left to look at brother. Pt requesting to shave. Pt provided warm cloth with verbal cue hold this cloth against your face. Pt sustained attention to task for ~20 seconds then needed tactile cue to continue task. Pt provided shaving cream and initially attempting to eat it. Pt shown visually shaving cream and asked where does shaving cream go? Pt states face. Pt needed tactile cue to initiate application and Mod (A) to apply . Pt initiated left side of face without tactile cue. Pt provided razor and hand over hand. Pt initiated task however needed hand over hand to apply pt to gently apply  razor. Pt would benefit from electric razor for input of vibration and weight to help maintain attention level . Pt shaving rt side of face with min-mod (A) and terminating task. Pt required Max (A) due to razor sharpeness and Lt side of the face. Pt 's brother helping patient shave and pt holding head position with tactile input from brother. Pt provided wash clother s/p shave with verbal cue wash your face. Pt attempting after ~20 seconds to touch face with hand only. Pt provided mod (A) to bring wash cloth to face. Pt wiping 50% of face surface and terminating task. Pt provided quiet environment after shave of about ~3 minutes to allow pt to progress grooming task. Pt began to touch face and states "feels better" Ot states "what feels better' pt states "my face feels better" Ot states "why does your face feel better' PT states "clean washed face cut hair" Ot states "what did you do to clean your face" Pt states "cut hair" Ot states "what does cut hair mean Anthony Hardin?" Pt states "shave face" Pt provided glasses at midline on table . Pt provided questioning cues to verbalize object name and locate object. Pt required tactile cue to initiate and 1 minute to don glasss. At 20 seconds OT asked "what are you suppose to do Anthony Hardin?"  pt states "put on my glasses" however pt remained without iniitation however verbalizes command. Pt don glasses with incr time and states "that is alot of thinking". Pt provided pen and paper per brother request due to [facility] paper work pending. Pt with questioning cue able to state name and spell name. Pt perservating on spelling name a loud. Pt states "  let me do it with my lt hand" showing pt recognizes pen is in rt non dominant hand. Pt demonstrates associated reaction of digit wiggles attempting to grab pen. Pt after multiple attempts and max v/c and tactile cues from OT pt states "i can't do it with my left. My left hand can't" Pt demonstrates and verbalizes this session awareness of lt hand  use for dominance in adls and the lack of ability to utilize lt side for tasks. Pt 's brother educated on how taxing exercises during OT session can be on the brain and how pt might feel fatigued after such a successful session. Pt demonstrates ability to correct posture by using Rt hand on arm rest and pulling self to Rt side and states I am falling just so tired. Pt asked to assisted with dialing the phone. Phone placed in Rt hand and therapist verbalized the number one digit at a time and pt completed 5 digit in house call to another therapist.   OT Diagnosis:    OT Problem List:   OT Treatment Interventions:     OT Goals Acute Rehab OT Goals OT Goal Formulation: With patient Time For Goal Achievement: 11/17/11 Potential to Achieve Goals: Good ADL Goals Pt Will Perform Eating: with mod assist;Supported;Sitting, chair;Supine, head of bed up Pt Will Perform Grooming: with min assist;Supported;Sitting, edge of bed ADL Goal: Grooming - Progress: Progressing toward goals Pt Will Perform Upper Body Bathing: with mod assist;Sitting, chair;Supported Engineer, water to Toilet: with mod assist;3-in-1;Stand pivot transfer Additional ADL Goal #1: Pt will sustain attention for 10 mins during simple ADL tasks ADL Goal: Additional Goal #1 - Progress: Progressing toward goals Additional ADL Goal #2: Pt. will use Lt. UE as a support with min A Miscellaneous OT Goals Miscellaneous OT Goal #1: Pt will perform bed mobility at Min (A) level as precursor to adls Miscellaneous OT Goal #2: Pt will located 2 out 4 objects (50%) on Lt side with only verbal request to decrease Lt neglect OT Goal: Miscellaneous Goal #2 - Progress: Progressing toward goals Miscellaneous OT Goal #3: Pt will tolerate eye patch schedule to decrease diplopia as precursor to adls OT Goal: Miscellaneous Goal #3 - Progress: Progressing toward goals  Visit Information  Last OT Received On: 11/11/11 Assistance Needed: +2    Subjective  Data      Prior Functioning       Cognition  Overall Cognitive Status: Impaired Area of Impairment: Attention;Following commands;Safety/judgement;Awareness of deficits;Problem solving Arousal/Alertness: Awake/alert Orientation Level: Appears intact for tasks assessed Behavior During Session: [facility] for tasks performed Current Attention Level: Sustained Attention - Other Comments: Needed redirection to task Following Commands: Follows one step commands inconsistently;Follows one step commands with increased time Safety/Judgement: Decreased safety judgement for tasks assessed;Decreased awareness of need for assistance Awareness of Errors: Assistance required to identify errors made Awareness of Errors - Other Comments: Pt demonstrates some awareness of deficits with hand writing exercises    Mobility  Shoulder Instructions Bed Mobility Bed Mobility: Rolling Right;Right Sidelying to Sit Rolling Right: 3: Mod assist Right Sidelying to Sit: 2: Max assist Details for Bed Mobility Assistance: Verbal and tactile cues for transitions.  Mult cues to engage RUE to assist with sitting Transfers Sit to Stand: 1: +2 Total assist;From bed Sit to Stand: Patient Percentage: 60% Stand to Sit: 1: +2 Total assist;To bed;To chair/3-in-1 Stand to Sit: Patient Percentage: 60% Details for Transfer Assistance: Patient stood with +2 assist, leaning to left.  Patient with difficulty shifting  weight to right with tactile cues.  In standing, worked on midline upright posture, and weight bearing on LLE with assist to maintain knee extension. Difficulty with pivot to chair.       Exercises      Balance Balance Balance Assessed: Yes Static Sitting Balance Static Sitting - Balance Support: No upper extremity supported;Feet supported Static Sitting - Level of Assistance: 5: Stand by assistance Static Sitting - Comment/# of Minutes: Patient able to work in sitting x5 minutes.  Worked on maintaining upright,  midline posture with head at midline.  Had patient attempt to reach for PT's hand on right side - difficulty initiating reach.   Static Standing Balance Static Standing - Balance Support: Right upper extremity supported Static Standing - Level of Assistance: 1: +2 Total assist Static Standing - Comment/# of Minutes: Patient stood x 2 minutes with +2 assist for balance.  Patient leaning to left.  Encouraged weight shift to right, midline posture.  Physical assist needed to maintain left knee extension.   End of Session OT - End of Session Activity Tolerance: Patient tolerated treatment well Patient left: in chair;with call bell/phone within reach  GO    SEE HAND WRITING IN GHOST CHART under HHA yellow tab    Harrel Carina [facility] 11/11/2011, 3:25 PM Pager: [phone]

## 2011-11-11 NOTE — Progress Notes (Signed)
Physical Therapy Treatment Patient Details Name: Anthony Hardin MRN: [id number] DOB: 08/05/1950 Today's Date: 11/11/2011 Time: 2130-8657 PT Time Calculation (min): 18 min  PT Assessment / Plan / Recommendation Comments on Treatment Session  Improved arousal and participation today.  Improved sitting balance.  Agree with need for CIR.    Follow Up Recommendations  Inpatient Rehab    Barriers to Discharge        Equipment Recommendations  Defer to next venue    Recommendations for Other Services Rehab consult  Frequency Min 4X/week   Plan Discharge plan remains appropriate;Frequency remains appropriate    Precautions / Restrictions Precautions Precautions: Fall Precaution Comments: Patient with L neglect Restrictions Weight Bearing Restrictions: No       Mobility  Bed Mobility Bed Mobility: Rolling Right;Right Sidelying to Sit Rolling Right: 3: Mod assist Right Sidelying to Sit: 2: Max assist Details for Bed Mobility Assistance: Verbal and tactile cues for transitions.  Mult cues to engage RUE to assist with sitting Transfers Transfers: Sit to Stand;Stand to Sit;Stand Pivot Transfers Sit to Stand: 1: +2 Total assist;From bed Sit to Stand: Patient Percentage: 60% Stand to Sit: 1: +2 Total assist;To bed;To chair/3-in-1 Stand to Sit: Patient Percentage: 60% Stand Pivot Transfers: 1: +2 Total assist Stand Pivot Transfers: Patient Percentage: 60% Details for Transfer Assistance: Patient stood with +2 assist, leaning to left.  Patient with difficulty shifting weight to right with tactile cues.  In standing, worked on midline upright posture, and weight bearing on LLE with assist to maintain knee extension. Difficulty with pivot to chair. Ambulation/Gait Ambulation/Gait Assistance: Not tested (comment) Modified Rankin (Stroke Patients Only) Pre-Morbid Rankin Score: No symptoms Modified Rankin: Severe disability     PT Goals Acute Rehab PT Goals PT Goal: Rolling Supine  to Right Side - Progress: Progressing toward goal PT Goal: Supine/Side to Sit - Progress: Progressing toward goal PT Goal: Sit to Stand - Progress: Progressing toward goal PT Transfer Goal: Bed to Chair/Chair to Bed - Progress: Progressing toward goal  Visit Information  Last PT Received On: 11/11/11 Assistance Needed: +2    Subjective Data  Subjective: "There's a lot of thinking to do"   Cognition  Overall Cognitive Status: Impaired Area of Impairment: Attention;Following commands;Safety/judgement;Awareness of deficits;Problem solving Arousal/Alertness: Awake/alert Orientation Level: Appears intact for tasks assessed Behavior During Session: [facility] for tasks performed Current Attention Level: Focused Attention - Other Comments: Needed redirection to task Following Commands: Follows one step commands inconsistently Safety/Judgement: Decreased safety judgement for tasks assessed;Decreased awareness of need for assistance    Balance  Balance Balance Assessed: Yes Static Sitting Balance Static Sitting - Balance Support: No upper extremity supported;Feet supported Static Sitting - Level of Assistance: 5: Stand by assistance Static Sitting - Comment/# of Minutes: Patient able to work in sitting x5 minutes.  Worked on maintaining upright, midline posture with head at midline.  Had patient attempt to reach for PT's hand on right side - difficulty initiating reach.   Static Standing Balance Static Standing - Balance Support: Right upper extremity supported Static Standing - Level of Assistance: 1: +2 Total assist Static Standing - Comment/# of Minutes: Patient stood x 2 minutes with +2 assist for balance.  Patient leaning to left.  Encouraged weight shift to right, midline posture.  Physical assist needed to maintain left knee extension.  End of Session PT - End of Session Equipment Utilized During Treatment: Gait belt Activity Tolerance: Patient tolerated treatment well;Patient limited by  fatigue Patient left: in chair;with family/visitor  present;with call bell/phone within reach Nurse Communication: Mobility status   GP     Vena Austria 11/11/2011, 12:02 PM Durenda Hurt. Renaldo Fiddler, [facility] Acute Rehab Services Pager [phone]

## 2011-11-12 MED ORDER — LISINOPRIL 10 MG PO TABS
10.0000 mg | ORAL_TABLET | Freq: Every day | ORAL | Status: DC
  Administered 2011-11-13 (×2): 10 mg via ORAL
  Filled 2011-11-12 (×4): qty 1

## 2011-11-12 NOTE — Progress Notes (Signed)
Physical Therapy Treatment Patient Details Name: Anthony Hardin MRN: [id number] DOB: [birth year] Today's Date: 11/12/2011 Time: 1324-4010 PT Time Calculation (min): 16 min  PT Assessment / Plan / Recommendation Comments on Treatment Session  Pt with great mobility this session, although still limited secondary to L sided weakness. Pt motivated to increase strength and mobility.    Follow Up Recommendations  Inpatient Rehab    Barriers to Discharge        Equipment Recommendations  Defer to next venue    Recommendations for Other Services Rehab consult  Frequency Min 4X/week   Plan Discharge plan remains appropriate;Frequency remains appropriate    Precautions / Restrictions Precautions Precautions: Fall Precaution Comments: Patient with L neglect Restrictions Weight Bearing Restrictions: No   Pertinent Vitals/Pain No c/o pain    Mobility  Bed Mobility Bed Mobility: Supine to Sit;Sitting - Scoot to Edge of Bed Supine to Sit: 3: Mod assist Sitting - Scoot to Edge of Bed: 4: Min guard Details for Bed Mobility Assistance: Assist with LLE although pt with great trunk strength and R sided strength to be able to pull up into sitting.  Transfers Transfers: Sit to Stand;Stand to Sit;Stand Pivot Transfers Sit to Stand: 1: +2 Total assist;From bed Sit to Stand: Patient Percentage: 70% Stand to Sit: 1: +2 Total assist;To bed;To chair/3-in-1 Stand to Sit: Patient Percentage: 70% Stand Pivot Transfers: 1: +2 Total assist Stand Pivot Transfers: Patient Percentage: 60% Details for Transfer Assistance: +2 assistance required for safety. Assist x 1 to maintain stability of LLE and trunk control. Pt with great movement and was able to bring himself into standing, cues for anterior weight shift and control. Increased difficulty with LLE during transfer from bed to chair, assist with LLE initiation and movement Ambulation/Gait Ambulation/Gait Assistance: Not tested (comment)    Exercises      PT Diagnosis:    PT Problem List:   PT Treatment Interventions:     PT Goals Acute Rehab PT Goals PT Goal: Rolling Supine to Right Side - Progress: Progressing toward goal PT Goal: Rolling Supine to Left Side - Progress: Progressing toward goal PT Goal: Supine/Side to Sit - Progress: Progressing toward goal PT Goal: Sit to Stand - Progress: Progressing toward goal PT Transfer Goal: Bed to Chair/Chair to Bed - Progress: Progressing toward goal  Visit Information  Last PT Received On: 11/12/11 Assistance Needed: +2    Subjective Data      Cognition  Overall Cognitive Status: Impaired Arousal/Alertness: Awake/alert Orientation Level: Appears intact for tasks assessed Behavior During Session: [facility] for tasks performed Current Attention Level: Sustained Attention - Other Comments: Needed redirection to task Following Commands: Follows one step commands inconsistently;Follows one step commands with increased time Awareness of Errors: Assistance required to identify errors made    Balance  Static Sitting Balance Static Sitting - Balance Support: No upper extremity supported;Feet supported Static Sitting - Level of Assistance: 5: Stand by assistance  End of Session PT - End of Session Equipment Utilized During Treatment: Gait belt Activity Tolerance: Patient tolerated treatment well Patient left: in chair;with family/visitor present;with call bell/phone within reach Nurse Communication: Mobility status   GP     Milana Kidney 11/12/2011, 2:06 PM

## 2011-11-12 NOTE — Progress Notes (Signed)
Occupational Therapy Treatment Patient Details Name: OMARIAN JAQUITH MRN: [id number] DOB: 05-Jul-1950 Today's Date: 11/12/2011 Time: 4098-1191 OT Time Calculation (min): 16 min  OT Assessment / Plan / Recommendation Comments on Treatment Session Excellent session with notable motor control of LLE in standing. Noted min inf subluxation of L shoulder but not symptomatic. Discussed proper positioniong with family and nursing to minimize trauma to LUE. Pt's brother states that he squeezed L hand today. Unable to elicit voluntarily with therapist. Using eye patch and alternating q 2 hrs to decrease diplopia and increase function.    Follow Up Recommendations  Inpatient Rehab    Barriers to Discharge   none    Equipment Recommendations  Defer to next venue    Recommendations for Other Services Rehab consult  Frequency Min 3X/week   Plan Discharge plan remains appropriate    Precautions / Restrictions Precautions Precautions: Fall Precaution Comments: Patient with L neglect Restrictions Weight Bearing Restrictions: No   Pertinent Vitals/Pain none    ADL  Lower Body Dressing: Performed;Maximal assistance Toilet Transfer: Maximal assistance;Performed Toilet Transfer: Patient Percentage: 60% Toilet Transfer Method: Sit to stand;Stand pivot Toilet Transfer Equipment: Bedside commode Toileting - Clothing Manipulation and Hygiene: Performed;+1 Total assistance Toileting - Clothing Manipulation and Hygiene: Patient Percentage: 0% Where Assessed - Toileting Clothing Manipulation and Hygiene: Standing ADL Comments: Use of backward chaining to donn L sock. Focus of session on midline orienation and attention to L. Requires Max tactile cues to attend. sit - stand with use tactile cues to increase midline alignment.. Able to maintain LLE motor control in standing. Motor impersistence noted. Able to turn LLE back on with tactile cues.     OT Diagnosis:    OT Problem List:   OT Treatment  Interventions:     OT Goals Acute Rehab OT Goals OT Goal Formulation: With patient Time For Goal Achievement: 11/17/11 Potential to Achieve Goals: Good ADL Goals Pt Will Perform Grooming: with min assist;Supported;Sitting, edge of bed ADL Goal: Grooming - Progress: Progressing toward goals Pt Will Transfer to Toilet: with mod assist;3-in-1;Stand pivot transfer ADL Goal: Toilet Transfer - Progress: Progressing toward goals Additional ADL Goal #1: Pt will sustain attention for 10 mins during simple ADL tasks ADL Goal: Additional Goal #1 - Progress: Progressing toward goals Additional ADL Goal #2: Pt. will use Lt. UE as a support with min A ADL Goal: Additional Goal #2 - Progress: Progressing toward goals Miscellaneous OT Goals Miscellaneous OT Goal #1: Pt will perform bed mobility at Min (A) level as precursor to adls OT Goal: Miscellaneous Goal #1 - Progress: Progressing toward goals Miscellaneous OT Goal #2: Pt will located 2 out 4 objects (50%) on Lt side with only verbal request to decrease Lt neglect OT Goal: Miscellaneous Goal #2 - Progress: Progressing toward goals Miscellaneous OT Goal #3: Pt will tolerate eye patch schedule to decrease diplopia as precursor to adls OT Goal: Miscellaneous Goal #3 - Progress: Progressing toward goals  Visit Information  Last OT Received On: 11/12/11 Assistance Needed: +2    Subjective Data      Prior Functioning       Cognition  Overall Cognitive Status: Impaired Area of Impairment: Attention;Following commands;Safety/judgement;Awareness of deficits;Problem solving Arousal/Alertness: Awake/alert Orientation Level: Appears intact for tasks assessed Behavior During Session: [facility] for tasks performed Current Attention Level: Sustained Attention - Other Comments: Minimize distraction. internally distracted. Following Commands: Follows one step commands inconsistently;Follows one step commands with increased time Safety/Judgement: Decreased  awareness of safety precautions;Decreased safety judgement  for tasks assessed;Decreased awareness of need for assistance;Impulsive Awareness of Errors: Assistance required to identify errors made    Mobility  Shoulder Instructions Bed Mobility Bed Mobility: Sit to Supine Rolling Right: 2: Max assist Supine to Sit: 3: Mod assist Sitting - Scoot to Edge of Bed: 4: Min guard Sit to Supine: Patient Percentage: 40% Details for Bed Mobility Assistance: Assist with LLE although pt with great trunk strength and R sided strength to be able to pull up into sitting.  Transfers Transfers: Sit to Stand;Stand to Sit Sit to Stand: 2: Max assist;From chair/3-in-1;With upper extremity assist Sit to Stand: Patient Percentage: 70% Stand to Sit: 2: Max assist;To chair/3-in-1;With upper extremity assist;Other (comment) (tactile cues to initiae sit) Stand to Sit: Patient Percentage: 70% Details for Transfer Assistance: Apparent  deficit with relay of information - delay in initiation       Exercises   neuro rehab   Balance Balance Balance Assessed:  (L lat lean) Static Sitting Balance Static Sitting - Balance Support: No upper extremity supported;Feet supported Static Sitting - Level of Assistance: 5: Stand by assistance   End of Session OT - End of Session Equipment Utilized During Treatment: Gait belt Activity Tolerance: Patient tolerated treatment well Patient left: with nursing in room;Other (comment) (on [facility] with nsg) Nurse Communication: Mobility status  GO     Maysin Carstens,H[place]ARY 11/12/2011, 4:38 PM [facility], OTR/L  [phone] 11/12/2011

## 2011-11-12 NOTE — Progress Notes (Signed)
Stroke Team Progress Note  HISTORY Anthony Hardin is an 61 y.o. male who was last seen normal on 8/16 after work. Patient reports that he began to feel numbness and weakness on his left side. Reports that he was unable to move and in attempting to move fell out of the bed and onto the floor 08/17. Was unable to get off the floor and was found by co-workers 08/19. EMS was called and patient was brought in for evaluation. 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  Surrounding edema and mild mass effect without midline shift.   SUBJECTIVE Brother is at bedside. Pt is sleeping, no exam is performed.  OBJECTIVE Most recent Vital Signs: Filed Vitals:   11/11/11 1836 11/11/11 2222 11/12/11 0448 11/12/11 1002  BP: 123/75 156/89 179/91 152/73  Pulse: 58 65 69 65  Temp: 97.8 F (36.6 C) 97.6 F (36.4 C) 97.7 F (36.5 C) 97.8 F (36.6 C)  TempSrc: Oral Oral Oral Oral  Resp: 16 18 18 18   Height:      Weight:      SpO2: 97% 99% 98% 97%   Intake/Output from previous day: 08/31 0701 - 09/01 0700 In: 360 [P.O.:360] Out: 1800 [Urine:1800]  IV Fluid Intake:   MEDICATIONS     . amLODipine  10 mg Oral Daily  . antiseptic oral rinse  15 mL Mouth Rinse BID  . cloNIDine  0.1 mg Transdermal Weekly  . enoxaparin (LOVENOX) injection  40 mg Subcutaneous Q24H  . hydrochlorothiazide  25 mg Oral Daily  . lisinopril  20 mg Oral Daily  . methylphenidate  5 mg Oral Q0600  . methylphenidate  5 mg Oral Daily  . pantoprazole  20 mg Oral Q1200  . potassium chloride  10 mEq Oral Daily  . senna-docusate  1 tablet Oral BID   PRN:  acetaminophen, hydrALAZINE, labetalol, ondansetron (ZOFRAN) IV, zolpidem  Diet:  DYS 3 THIN liquids Activity:  Ambulate  DVT Prophylaxis:  Lovenox 40 mg sq daily   CLINICALLY SIGNIFICANT STUDIES Basic Metabolic Panel:   Lab 11/10/11 1134 11/07/11 1040  NA 132* 138  K 4.1 3.4*  CL 93* 98  CO2 26 29    GLUCOSE 145* 142*  BUN 24* 15  CREATININE 1.15 0.95  CALCIUM 10.7* 10.1  MG -- --  PHOS -- --   CBC:   Lab 11/10/11 1134 11/06/11 0400  WBC 11.1* 9.8  NEUTROABS -- 6.9  HGB 15.8 13.7  HCT 43.9 38.1*  MCV 85.7 85.2  PLT 426* 274   Urinalysis negatvie  Urine Drug Screen:  normal  CT of the brain 11/04/2011 Stable large hemorrhage involving right thalamus with stable surrounding vasogenic edema. Stable to minimally more prominent mass effect on the third ventricle, with minimal increased dilatation of the third ventricle and temporal horns of the lateral ventricles. Stable bifrontal subarachnoid hemorrhage. 11/03/2011 No significant change seen involving large intraparenchymal hemorrhage in the right thalamus compared to prior exam. There does appear to be a decreased amount of hemorrhage within the right lateral ventricle compared to prior exam. No change is seen involving small amount of hemorrhage in right occipital horn or small amount of subarachnoid hemorrhage noted previously 11/01/2011   Right thalamic hemorrhage with ventricular extension.  No significant change from 10/30/2011.   10/30/2011   Acute intraparenchymal hemorrhage in the right thalamus extending to the right lateral ventricle.  Surrounding edema and mild mass effect without midline shift.  MRI of the brain 11/01/2011  4.1 x 2.5 cm acute hematoma with the epicenter in the right thalamus as outlined above.  Surrounding edema.  Mass effect with displacement distortion of the third ventricle but no suspicion of obstructive hydrocephalus at this moment.  Small amount of intraventricular blood.   MRA of the brain 11/01/2011   No evidence of high flow vascular malformation.  No large or medium vessel occlusion, correctable stenosis or aneurysm.     CXR  11/03/2011 Low volume film with mild cardiomegaly. No acute cardiopulmonary finding. 10/31/2011   No acute abnormality.  EKG  NSR.   EEG 11/01/2011 no seziure  Therapy  Recommendations PT - CIR; OT -CIR ; ST - CIR.  Physical Exam   The patient is sleeping in bed.  Respiratory examination is clear.  Cardiovascular examination reveals a regular rate rhythm,     ASSESSMENT Anthony Hardin is a 61 y.o. 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 Now off cardene gtt.   Patient with resultant left hemiparesis and lethargy. Therapies are recommending inpatient rehabilitation; decision of SNF vs rehab still pending. Remains medically ready for discharge.  -Neurosurgery consulted for possible ventriculostomy but felt not to be a candidate. Stable from NS standpoint. -Malignant hypertension. Off cardene. Started PO medications 8/21.  But limited po intake due to sleepiness. He is on norvasc 10mg  qday, HCTZ 25mg  qday, clonidine 0.1 mg patch and  Lisinopril 20mg  qam. Overall BP improving, but has elevated BP at evening time, -rhabdomyolysis, found down. CK high on admission. Resolved.  Recommend OP f/u  Hospital day # 13  TREATMENT/PLAN -plan rehab next week -ongoing PT, OT, ST -add on lisinopril 10mg  qhs.   11/12/2011 11:58 AM  Elek Holderness  Pager:  [phone]

## 2011-11-12 NOTE — Progress Notes (Signed)
Occupational Therapy Treatment Patient Details Name: Anthony Hardin MRN: [id number] DOB: 12-15-1950 Today's Date: 11/12/2011 Time: 5621-3086 OT Time Calculation (min): 16 min  OT Assessment / Plan / Recommendation Comments on Treatment Session Using nuero rehab techniques to facilitate walking. Max tactile cues for stepping. Excellent session.    Follow Up Recommendations  Inpatient Rehab    Barriers to Discharge       Equipment Recommendations  Defer to next venue    Recommendations for Other Services Rehab consult  Frequency Min 3X/week   Plan Discharge plan remains appropriate    Precautions / Restrictions Precautions Precautions: Fall Precaution Comments: Patient with L neglect Restrictions Weight Bearing Restrictions: No   Pertinent Vitals/Pain none    ADL  Lower Body Dressing: Performed;Maximal assistance Toilet Transfer: Maximal assistance;Performed Toilet Transfer: Patient Percentage: 60% Toilet Transfer Method: Sit to stand;Stand pivot Toilet Transfer Equipment: Bedside commode Toileting - Clothing Manipulation and Hygiene: Performed;+1 Total assistance Toileting - Architect and Hygiene: Patient Percentage: 0% Where Assessed - Toileting Clothing Manipulation and Hygiene: Standing ADL Comments: nsg asked for assistance with transfer. Used 3 musketeer approach to walk from [facility] to bed. Pt required max tactile cues to initiate stepping and initiated wt shifts with mod A. increase upright posture. max cues to scan L. Pt pleased with mobility.    OT Diagnosis:    OT Problem List:   OT Treatment Interventions:     OT Goals Acute Rehab OT Goals OT Goal Formulation: With patient Time For Goal Achievement: 11/17/11 Potential to Achieve Goals: Good ADL Goals Pt Will Perform Grooming: with min assist;Supported;Sitting, edge of bed ADL Goal: Grooming - Progress: Progressing toward goals Pt Will Transfer to Toilet: with mod assist;3-in-1;Stand pivot  transfer ADL Goal: Toilet Transfer - Progress: Progressing toward goals Additional ADL Goal #1: Pt will sustain attention for 10 mins during simple ADL tasks ADL Goal: Additional Goal #1 - Progress: Progressing toward goals Additional ADL Goal #2: Pt. will use Lt. UE as a support with min A ADL Goal: Additional Goal #2 - Progress: Progressing toward goals Miscellaneous OT Goals Miscellaneous OT Goal #1: Pt will perform bed mobility at Min (A) level as precursor to adls OT Goal: Miscellaneous Goal #1 - Progress: Progressing toward goals Miscellaneous OT Goal #2: Pt will located 2 out 4 objects (50%) on Lt side with only verbal request to decrease Lt neglect OT Goal: Miscellaneous Goal #2 - Progress: Progressing toward goals Miscellaneous OT Goal #3: Pt will tolerate eye patch schedule to decrease diplopia as precursor to adls OT Goal: Miscellaneous Goal #3 - Progress: Progressing toward goals  Visit Information  Last OT Received On: 11/12/11 Assistance Needed: +2    Subjective Data   " I like working with pros"   Prior Functioning       Cognition  Overall Cognitive Status: Impaired Area of Impairment: Attention;Following commands;Safety/judgement;Awareness of deficits;Problem solving Arousal/Alertness: Awake/alert Orientation Level: Appears intact for tasks assessed Behavior During Session: [facility] for tasks performed Current Attention Level: Sustained Attention - Other Comments: Minimize distraction. internally distracted. Following Commands: Follows one step commands inconsistently;Follows one step commands with increased time Safety/Judgement: Decreased awareness of safety precautions;Decreased safety judgement for tasks assessed;Decreased awareness of need for assistance;Impulsive Awareness of Errors: Assistance required to identify errors made    Mobility  Shoulder Instructions Bed Mobility Bed Mobility: Sit to Supine Rolling Right: 2: Max assist Supine to Sit: 3: Mod  assist Sitting - Scoot to Edge of Bed: 4: Min guard Sit to Supine:  Patient Percentage: 40% Details for Bed Mobility Assistance: Bed rearranged to increase attention to L with head turns. Pt/Family educated on purpose of moving bed in order to attend to L.  Transfers Transfers: Sit to Stand;Stand to Sit Sit to Stand: 2: Max assist;From chair/3-in-1;With upper extremity assist Sit to Stand: Patient Percentage: 70% Stand to Sit: 2: Max assist;To chair/3-in-1;With upper extremity assist;Other (comment) (tactile cues to initiae sit) Stand to Sit: Patient Percentage: 70% Details for Transfer Assistance: Apparent  deficit with relay of information - delay in initiation       Exercises      Balance Balance Balance Assessed:  (L lat lean) Static Sitting Balance Static Sitting - Balance Support: No upper extremity supported;Feet supported Static Sitting - Level of Assistance: 5: Stand by assistance   End of Session OT - End of Session Equipment Utilized During Treatment: Gait belt Activity Tolerance: Patient tolerated treatment well Patient left: in bed;with call bell/phone within reach;with family/visitor present Nurse Communication: Mobility status  GO     Anthony Hardin,Anthony Hardin 11/12/2011, 4:44 PM [facility], OTR/L  [phone] 11/12/2011

## 2011-11-13 NOTE — Progress Notes (Signed)
Physical Therapy Treatment Patient Details Name: Anthony Hardin MRN: [id number] DOB: March 18, 1950 Today's Date: 11/13/2011 Time: 4098-1191 PT Time Calculation (min): 52 min  PT Assessment / Plan / Recommendation Comments on Treatment Session  Patient really making some great gains with therapy. Patient more alert and able to participate more this session and starting to gain awareness of his situation and his deficits. Patient having issues with initiation of task therefore taking increased time to follow through with task and requiring some redirection throughout. Patient will make a great CIR canidate and plans on being admitted to CIR in the morning     Follow Up Recommendations  Inpatient Rehab    Barriers to Discharge        Equipment Recommendations  Defer to next venue    Recommendations for Other Services    Frequency Min 4X/week   Plan Discharge plan remains appropriate;Frequency remains appropriate    Precautions / Restrictions Precautions Precautions: Fall Precaution Comments: Patient with L neglect Restrictions Weight Bearing Restrictions: No   Pertinent Vitals/Pain     Mobility  Bed Mobility Supine to Sit: 4: Min assist;With rails Sitting - Scoot to Edge of Bed: 4: Min assist Details for Bed Mobility Assistance: With increased time and VCs patient able to sit up with assistance for truncal support Transfers Sit to Stand: 3: Mod assist;With upper extremity assist;From bed Stand to Sit: 3: Mod assist;With upper extremity assist;To bed;To chair/3-in-1 Stand Pivot Transfers: 1: +2 Total assist Stand Pivot Transfers: Patient Percentage: 40% Details for Transfer Assistance: Patient requiring gross cueing such as "stand up" "sit in chair" vs. lots of small cues. Patient requiring A on his  L side to prevent buckling. Placed chair in front of patient for visual and UE support while standing Modified Rankin (Stroke Patients Only) Pre-Morbid Rankin Score: No  symptoms Modified Rankin: Severe disability    Exercises     PT Diagnosis:    PT Problem List:   PT Treatment Interventions:     PT Goals Acute Rehab PT Goals PT Goal: Supine/Side to Sit - Progress: Progressing toward goal PT Goal: Sit to Stand - Progress: Progressing toward goal PT Transfer Goal: Bed to Chair/Chair to Bed - Progress: Progressing toward goal  Visit Information  Last PT Received On: 11/13/11 Assistance Needed: +2 PT/OT Co-Evaluation/Treatment: Yes    Subjective Data      Cognition  Overall Cognitive Status: Impaired Area of Impairment: Attention;Following commands;Safety/judgement;Awareness of deficits;Problem solving Arousal/Alertness: Awake/alert Orientation Level: Appears intact for tasks assessed Behavior During Session: [facility] for tasks performed Current Attention Level: Sustained Attention - Other Comments: Patient can be easily distracted and it can also take him increased time with motor planning and initiation of task.  Following Commands: Follows one step commands with increased time Safety/Judgement: Decreased awareness of safety precautions Awareness of Errors: Assistance required to identify errors made;Assistance required to correct errors made Awareness of Errors - Other Comments: Pt demonstrates some awareness of deficits with hand writing exercises Awareness of Deficits: Patient starting to gain concept of his deficits.  Problem Solving: difficult to accurately asses due to severity of initiation deficits Cognition - Other Comments: Pt. maintained arrousal throughout entire session    Balance  Static Sitting Balance Static Sitting - Level of Assistance: 5: Stand by assistance Static Sitting - Comment/# of Minutes: Patient able to sit EOB x10 minutes while working on multitasking. Able to find midline without much difficulty this session Static Standing Balance Static Standing - Balance Support: Right upper extremity  supported Static Standing  - Level of Assistance: 1: +2 Total assist Static Standing - Comment/# of Minutes: Patient stood x5 min while weight shifting and working on midline posture and support. A for L knee extension. About 25% of time patient able to support L knee from buckling.   End of Session PT - End of Session Equipment Utilized During Treatment: Gait belt Activity Tolerance: Patient tolerated treatment well Patient left: in chair;with call bell/phone within reach;with family/visitor present Nurse Communication: Mobility status   GP     Fredrich Birks 11/13/2011, 2:56 PM 11/13/2011 Fredrich Birks PTA [phone] pager [phone] office

## 2011-11-13 NOTE — Progress Notes (Signed)
Occupational Therapy Treatment Patient Details Name: Anthony Hardin MRN: [id number] DOB: [birth year] Today's Date: 11/13/2011 Time: 4098-1191 OT Time Calculation (min): 52 min  OT Assessment / Plan / Recommendation Comments on Treatment Session Pt. continues to make excellent progress.  Pt. continues to demonstrate improved attention to Lt.  He is demonstrating spontaneous movement lt. UE and Lt. LE. He is tolerating nasal occlusion to Lt. lens of glasses to reduce impact of diplopia.  Pt. demonstrates improving awareness of deficits, and improved ability to initiate and motor plan through activities.  Arrousal significantly improved.      Follow Up Recommendations  Inpatient Rehab    Barriers to Discharge       Equipment Recommendations  Defer to next venue    Recommendations for Other Services Rehab consult  Frequency Min 3X/week   Plan Discharge plan remains appropriate    Precautions / Restrictions Precautions Precautions: Fall Precaution Comments: Patient with L neglect Restrictions Weight Bearing Restrictions: No   Pertinent Vitals/Pain     ADL  Toilet Transfer: Simulated;+2 Total assistance Toilet Transfer: Patient Percentage: 60% Toilet Transfer Method: Stand pivot;Sit to Barista: Other (comment) (to recliner) Toileting - Clothing Manipulation and Hygiene: Simulated;+1 Total assistance Toileting - Clothing Manipulation and Hygiene: Patient Percentage: 10% Where Assessed - Toileting Clothing Manipulation and Hygiene: Standing Transfers/Ambulation Related to ADLs: Total A +2 for transfer bed to recliner.  Pt. 40-70%.   Pt. "gets stuck" in middle of transfer, and has difficulty re-initiating and motor planning movement.  During that time, pt. pushes heavily to Lt. until he is able to reinitiate activity, then he is able to perform ~70%.   ADL Comments: Pt recognized therapist upon entry.  He was able to recall earlier session and what we worked on.   He was also able to recall conversation in regards to CVA and deficits.   Pt. noted with spontaneous thumb extension this pm when discussing Lt UE.  Also able to release cylindrical object with full active extension of digits Lt. hand after signficant delay.    With person on his Rt, pt. able to attend to therapist on his Lt. with minimal cuing    OT Diagnosis:    OT Problem List:   OT Treatment Interventions:     OT Goals ADL Goals ADL Goal: Toilet Transfer - Progress: Progressing toward goals ADL Goal: Additional Goal #1 - Progress: Progressing toward goals ADL Goal: Additional Goal #2 - Progress: Progressing toward goals Miscellaneous OT Goals OT Goal: Miscellaneous Goal #1 - Progress: Progressing toward goals OT Goal: Miscellaneous Goal #2 - Progress: Progressing toward goals OT Goal: Miscellaneous Goal #3 - Progress: Discontinued (comment)  Visit Information  Last OT Received On: 11/13/11 Assistance Needed: +2 PT/OT Co-Evaluation/Treatment: Yes    Subjective Data      Prior Functioning       Cognition  Overall Cognitive Status: Impaired Area of Impairment: Attention;Following commands;Safety/judgement;Awareness of deficits;Problem solving Arousal/Alertness: Awake/alert Orientation Level: Appears intact for tasks assessed Behavior During Session: [facility] for tasks performed Current Attention Level: Sustained Attention - Other Comments: Attention very difficult to accurately asses due to significance of motor planning and initiation deficits.  Pt. with sometimes a 30-45 second delay and will at times appear that he has lost focus/attention only to execute movement, or answer question asked. Following Commands: Follows one step commands with increased time Safety/Judgement: Decreased awareness of safety precautions Awareness of Errors: Assistance required to identify errors made;Assistance required to correct errors made Awareness of  Errors - Other Comments: Pt. is able to  verbalize deficits, but unable to indicate how they affect him functionally Awareness of Deficits: Patient starting to gain concept of his deficits.  Problem Solving: difficult to accurately asses due to severity of initiation deficits Cognition - Other Comments: Pt. maintained arrousal throughout entire session    Mobility  Shoulder Instructions Bed Mobility Bed Mobility: Supine to Sit Supine to Sit: 3: Mod assist Sitting - Scoot to Edge of Bed: 4: Min guard Details for Bed Mobility Assistance: Bed rearranged to increase attention to L with head turns. Pt/Family educated on purpose of moving bed in order to attend to L.  Transfers Transfers: Sit to Stand;Stand to Sit Sit to Stand: 3: Mod assist;With upper extremity assist;From bed Sit to Stand: Patient Percentage: 70% Stand to Sit: 3: Mod assist;With upper extremity assist;To chair/3-in-1 Details for Transfer Assistance: Apparent  deficit with relay of information - delay in initiation       Exercises      Balance Static Sitting Balance Static Sitting - Level of Assistance: 5: Stand by assistance Static Sitting - Comment/# of Minutes: Patient able to sit EOB x10 minutes while working on multitasking. Able to find midline without much difficulty this session Static Standing Balance Static Standing - Balance Support: Right upper extremity supported Static Standing - Level of Assistance: 1: +2 Total assist Static Standing - Comment/# of Minutes: Patient stood x5 min while weight shifting and working on midline posture and support. A for L knee extension. About 25% of time patient able to support L knee from buckling.    End of Session OT - End of Session Equipment Utilized During Treatment: Gait belt Activity Tolerance: Patient tolerated treatment well Patient left: in chair;with call bell/phone within reach;with family/visitor present Nurse Communication: Mobility status  GO     Annalycia Done M 11/13/2011, 3:05 PM

## 2011-11-13 NOTE — Progress Notes (Signed)
Clinical Social Work  Per coverage report, CSW asked to fax PT/OT notes to [place] [facility] [place]. CSW faxed all clinicals from PT/OT weekend sessions to insurance company. Please contact CSW if further needs arise.  [place], [place] [phone] (Coverage for [employer] Query)

## 2011-11-13 NOTE — Progress Notes (Signed)
I will follow up with [employer] in the morning to assist with approval and transition to admission to inpt rehab . Please call with questions. [phone]

## 2011-11-13 NOTE — Progress Notes (Signed)
Occupational Therapy Treatment Patient Details Name: LEONARDO MAKRIS MRN: [id number] DOB: 1950/11/28 Today's Date: 11/13/2011 Time: 1478-2956 OT Time Calculation (min): 46 min  OT Assessment / Plan / Recommendation Comments on Treatment Session Pt. arroused and attending to Lt. side for full session.  Pt. initiating movement of Lt. UE with max facilitiation to drink from cup.  At one point pt. flexed elbow to 90* actively when attempting to drink from cup, but either became distracted, or had difficulty reinitiating the movement once it started.  Pt. sqeezed/grasped cup three times actively and spontaneously.       Follow Up Recommendations  Inpatient Rehab    Barriers to Discharge       Equipment Recommendations  Defer to next venue    Recommendations for Other Services Rehab consult  Frequency Min 3X/week   Plan Discharge plan remains appropriate    Precautions / Restrictions Precautions Precautions: Fall Precaution Comments: Patient with L neglect Restrictions Weight Bearing Restrictions: No   Pertinent Vitals/Pain     ADL  Eating/Feeding: Maximal assistance (To drink from cup with Lt (hemi) UE) Where Assessed - Eating/Feeding: Bed level Lower Body Dressing: Minimal assistance (to don Rt. sock and max instructional cues) Where Assessed - Lower Body Dressing: Supine, head of bed up ADL Comments: Pt. supine with HOB elevated.  Glasses in place.  Tape for nasal occlusion on Lt. lens has been removed and replaced, but does not offer adequate coverage.  Lens retaped correctly.  Once lens taped correctly, pt. noted to use eyes binocularly without closing Lt. eye.   Pt. attending fully to therapist on Lt. and held conversation with therapist who was standing on his left.  Pt. donned Rt. sock with min A and max cues for attention and to initiate activity.   With moderate verbal and tactile cues, pt. located Lt. UE.  Cup placed in Lt. UE, and pt. drank from cup with max facilitation.   Pt. noted to spontaneously activate movement of Lt. UE, but unable to sustain movement due to attentional and motor  plannind deficits.  Pt. noted to spontandeously grip cup x 3, and actively flexed elbow to ~90* x 1 as he tried to drink from the cup.  Pt. then became distracted and required cues to reinitiate activity.  Spontaneous elbow flex and extension noted as well as small range of supination during as pt. attempting to drink.   Pt informed of deficits, and states "Lt. neglect.  I don't even know the left is there, it's so strange"   Pt. fatigued at end of session, and asking to take a rest break    OT Diagnosis:    OT Problem List:   OT Treatment Interventions:     OT Goals Acute Rehab OT Goals OT Goal Formulation: With patient ADL Goals ADL Goal: Eating - Progress: Progressing toward goals ADL Goal: Additional Goal #1 - Progress: Progressing toward goals ADL Goal: Additional Goal #2 - Progress: Progressing toward goals Miscellaneous OT Goals OT Goal: Miscellaneous Goal #2 - Progress: Progressing toward goals OT Goal: Miscellaneous Goal #3 - Progress: Discontinued (comment)  Visit Information  Last OT Received On: 11/13/11 Assistance Needed: +2    Subjective Data      Prior Functioning       Cognition  Overall Cognitive Status: Impaired Area of Impairment: Attention;Following commands;Safety/judgement;Awareness of deficits;Problem solving Arousal/Alertness: Awake/alert Orientation Level: Appears intact for tasks assessed Behavior During Session: [facility] for tasks performed Current Attention Level: Sustained Attention - Other Comments: moderate  to maximal cues.  It continues to be difficult to discern attentional deficits from difficulties with motor planning and initiation Following Commands: Follows one step commands consistently (with increased time, and tactile and verbal cues) Safety/Judgement: Decreased awareness of safety precautions Awareness of Errors: Assistance  required to identify errors made;Assistance required to correct errors made Awareness of Errors - Other Comments: Pt demonstrates some awareness of deficits with hand writing exercises Problem Solving: difficult to accurately asses due to severity of initiation deficits Cognition - Other Comments: Pt. maintained arrousal throughout entire session    Mobility  Shoulder Instructions         Exercises      Balance     End of Session OT - End of Session Activity Tolerance: Patient tolerated treatment well Patient left: in bed;with call bell/phone within reach;with bed alarm set  GO     Leler Brion, [name] 11/13/2011, 12:16 PM

## 2011-11-13 NOTE — Progress Notes (Signed)
Stroke Team Progress Note  HISTORY Anthony Hardin is an 61 y.o. male who was last seen normal on 8/16 after work. Patient reports that he began to feel numbness and weakness on his left side. Reports that he was unable to move and in attempting to move fell out of the bed and onto the floor 08/17. Was unable to get off the floor and was found by co-workers 08/19. EMS was called and patient was brought in for evaluation. 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  Surrounding edema and mild mass effect without midline shift.   SUBJECTIVE More awake today. Intermittently confused and inattentive. No complaints.  OBJECTIVE Most recent Vital Signs: Filed Vitals:   11/12/11 1700 11/12/11 2158 11/13/11 0136 11/13/11 0514  BP: 142/78 149/84 130/66 151/93  Pulse: 72 66 60 60  Temp: 98 F (36.7 C) 97.2 F (36.2 C) 98.2 F (36.8 C) 97.6 F (36.4 C)  TempSrc: Oral Oral Oral Oral  Resp: 16 18 20 20   Height:      Weight:      SpO2: 98% 97% 99% 99%   MEDICATIONS    . amLODipine  10 mg Oral Daily  . antiseptic oral rinse  15 mL Mouth Rinse BID  . cloNIDine  0.1 mg Transdermal Weekly  . enoxaparin (LOVENOX) injection  40 mg Subcutaneous Q24H  . hydrochlorothiazide  25 mg Oral Daily  . lisinopril  10 mg Oral QHS  . lisinopril  20 mg Oral Daily  . methylphenidate  5 mg Oral Q0600  . methylphenidate  5 mg Oral Daily  . pantoprazole  20 mg Oral Q1200  . potassium chloride  10 mEq Oral Daily  . senna-docusate  1 tablet Oral BID   PRN:  acetaminophen, hydrALAZINE, labetalol, ondansetron (ZOFRAN) IV, zolpidem  Diet:  DYS 3 THIN liquids Activity:  Ambulate  DVT Prophylaxis:  Lovenox 40 mg sq daily   CLINICALLY SIGNIFICANT STUDIES Basic Metabolic Panel:   Lab 11/10/11 1134 11/07/11 1040  NA 132* 138  K 4.1 3.4*  CL 93* 98  CO2 26 29  GLUCOSE 145* 142*  BUN 24* 15  CREATININE 1.15 0.95  CALCIUM 10.7* 10.1  MG -- --    PHOS -- --   CBC:   Lab 11/10/11 1134  WBC 11.1*  NEUTROABS --  HGB 15.8  HCT 43.9  MCV 85.7  PLT 426*   Urinalysis negatvie  Urine Drug Screen:  normal  CT of the brain 11/04/2011 Stable large hemorrhage involving right thalamus with stable surrounding vasogenic edema. Stable to minimally more prominent mass effect on the third ventricle, with minimal increased dilatation of the third ventricle and temporal horns of the lateral ventricles. Stable bifrontal subarachnoid hemorrhage. 11/03/2011 No significant change seen involving large intraparenchymal hemorrhage in the right thalamus compared to prior exam. There does appear to be a decreased amount of hemorrhage within the right lateral ventricle compared to prior exam. No change is seen involving small amount of hemorrhage in right occipital horn or small amount of subarachnoid hemorrhage noted previously 11/01/2011   Right thalamic hemorrhage with ventricular extension.  No significant change from 10/30/2011.   10/30/2011   Acute intraparenchymal hemorrhage in the right thalamus extending to the right lateral ventricle.  Surrounding edema and mild mass effect without midline shift.   MRI of the brain 11/01/2011  4.1 x 2.5 cm acute hematoma with the epicenter in the right thalamus as outlined above.  Surrounding  edema.  Mass effect with displacement distortion of the third ventricle but no suspicion of obstructive hydrocephalus at this moment.  Small amount of intraventricular blood.   MRA of the brain 11/01/2011   No evidence of high flow vascular malformation.  No large or medium vessel occlusion, correctable stenosis or aneurysm.     CXR  11/03/2011 Low volume film with mild cardiomegaly. No acute cardiopulmonary finding. 10/31/2011   No acute abnormality.  EKG  NSR.   EEG 11/01/2011 no seziure  Therapy Recommendations PT - CIR; OT -CIR ; ST - CIR.  Physical Exam   GENERAL EXAM: Patient is in no  distress  CARDIOVASCULAR: Regular rate and rhythm, no murmurs, no carotid bruits  NEUROLOGIC: MENTAL STATUS: awake, alert, language fluent, WITH SOME LOGORRHEA. Comprehension intact, naming intact CRANIAL NERVE: Pupils equal and reactive to light, visual fields full to confrontation, SUBJECTIVE DIPLOPIA ON TRACKING PEN WITH BOTH EYES OPEN, IMPROVES WITH CLOSING EITHER EYE. No nystagmus, facial strength symmetric, uvula midline, shoulder shrug symmetric, tongue midline. MOTOR: RUE/RLE 5/5. LUE 2/5, LLE 2/5.  SENSORY: DECR IN LEFT FACE, ARM, LEG. COORDINATION: finger-nose-finger, fine finger movements, heel-shin normal REFLEXES: deep tendon reflexes present and symmetric  ASSESSMENT Anthony Hardin is a 61 y.o. 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 Now off cardene gtt.  Patient with resultant left hemiparesis and lethargy. Therapies are recommending inpatient rehabilitation; decision of SNF vs rehab still pending. Remains medically ready for discharge.  -Neurosurgery consulted for possible ventriculostomy but felt not to be a candidate. Stable from NS standpoint. -Malignant hypertension. Off cardene. Started PO medications 8/21.  But limited po intake due to sleepiness. He is on norvasc 10mg  qday, HCTZ 25mg  qday, clonidine 0.1 mg patch and  Lisinopril 20mg  qam/78m q pm added over weekend -rhabdomyolysis, found down. CK high on admission. Resolved.   Hospital day # 14  TREATMENT/PLAN -plan rehab transfer Tues -ongoing PT, OT, ST  SHARON BIBY, MSN, RN, ANVP-BC, ANP-BC, [facility] Pager: [phone] 11/13/2011 10:03 AM  Scribe for Dr. Joycelyn Schmid who has personally reviewed chart, pertinent data, examined the patient and developed the plan of care. Pager:  [phone]  [facility] - Stroke Team Joycelyn Schmid, MD 11/13/2011, 2:37 PM   Please refer to [email or web address] for on-call Stroke MD

## 2011-11-14 ENCOUNTER — Inpatient Hospital Stay (HOSPITAL_COMMUNITY)
Admission: RE | Admit: 2011-11-14 | Discharge: 2011-12-26 | DRG: 462 | Disposition: A | Discharge status: Home / Self Care | Payer: BC Managed Care – PPO | Source: Ambulatory Visit | Attending: Physical Medicine & Rehabilitation | Admitting: Physical Medicine & Rehabilitation

## 2011-11-14 DIAGNOSIS — H53469 Homonymous bilateral field defects, unspecified side: Secondary | ICD-10-CM

## 2011-11-14 DIAGNOSIS — R1310 Dysphagia, unspecified: Secondary | ICD-10-CM

## 2011-11-14 DIAGNOSIS — Z5189 Encounter for other specified aftercare: Principal | ICD-10-CM

## 2011-11-14 DIAGNOSIS — I610 Nontraumatic intracerebral hemorrhage in hemisphere, subcortical: Secondary | ICD-10-CM

## 2011-11-14 DIAGNOSIS — I619 Nontraumatic intracerebral hemorrhage, unspecified: Secondary | ICD-10-CM

## 2011-11-14 DIAGNOSIS — G8190 Hemiplegia, unspecified affecting unspecified side: Secondary | ICD-10-CM

## 2011-11-14 DIAGNOSIS — G936 Cerebral edema: Secondary | ICD-10-CM

## 2011-11-14 DIAGNOSIS — I10 Essential (primary) hypertension: Secondary | ICD-10-CM

## 2011-11-14 DIAGNOSIS — Z79899 Other long term (current) drug therapy: Secondary | ICD-10-CM

## 2011-11-14 DIAGNOSIS — H532 Diplopia: Secondary | ICD-10-CM

## 2011-11-14 MED ORDER — AMLODIPINE BESYLATE 10 MG PO TABS
10.0000 mg | ORAL_TABLET | Freq: Every day | ORAL | Status: DC
  Filled 2011-11-14 (×2): qty 1

## 2011-11-14 MED ORDER — POTASSIUM CHLORIDE CRYS ER 10 MEQ PO TBCR
10.0000 meq | EXTENDED_RELEASE_TABLET | Freq: Every day | ORAL | Status: DC
  Administered 2011-11-15 – 2011-12-26 (×42): 10 meq via ORAL
  Filled 2011-11-14 (×49): qty 1

## 2011-11-14 MED ORDER — LISINOPRIL 10 MG PO TABS
10.0000 mg | ORAL_TABLET | Freq: Every day | ORAL | Status: DC
  Administered 2011-11-14 – 2011-12-25 (×42): 10 mg via ORAL
  Filled 2011-11-14 (×46): qty 1

## 2011-11-14 MED ORDER — HYDROCHLOROTHIAZIDE 25 MG PO TABS
25.0000 mg | ORAL_TABLET | Freq: Every day | ORAL | Status: DC
  Administered 2011-11-15 – 2011-12-26 (×42): 25 mg via ORAL
  Filled 2011-11-14 (×46): qty 1

## 2011-11-14 MED ORDER — ACETAMINOPHEN 325 MG PO TABS
650.0000 mg | ORAL_TABLET | ORAL | Status: DC | PRN
  Administered 2011-11-16 – 2011-12-23 (×20): 650 mg via ORAL
  Filled 2011-11-14 (×19): qty 2

## 2011-11-14 MED ORDER — SORBITOL 70 % SOLN
30.0000 mL | Freq: Every day | Status: DC | PRN
  Administered 2011-11-21 – 2011-11-22 (×2): 30 mL via ORAL
  Filled 2011-11-14 (×2): qty 30

## 2011-11-14 MED ORDER — SENNOSIDES-DOCUSATE SODIUM 8.6-50 MG PO TABS
1.0000 | ORAL_TABLET | Freq: Two times a day (BID) | ORAL | Status: DC
  Administered 2011-11-14 – 2011-12-26 (×81): 1 via ORAL
  Filled 2011-11-14 (×84): qty 1

## 2011-11-14 MED ORDER — ONDANSETRON HCL 4 MG/2ML IJ SOLN: 4.0000 mg | Freq: Four times a day (QID) | INTRAMUSCULAR | Status: DC | PRN

## 2011-11-14 MED ORDER — ENOXAPARIN SODIUM 40 MG/0.4ML ~~LOC~~ SOLN
40.0000 mg | SUBCUTANEOUS | Status: DC
  Administered 2011-11-14 – 2011-12-25 (×41): 40 mg via SUBCUTANEOUS
  Filled 2011-11-14 (×43): qty 0.4

## 2011-11-14 MED ORDER — BIOTENE DRY MOUTH MT LIQD
15.0000 mL | Freq: Two times a day (BID) | OROMUCOSAL | Status: DC
  Administered 2011-11-14 – 2011-12-05 (×29): 15 mL via OROMUCOSAL

## 2011-11-14 MED ORDER — LISINOPRIL 20 MG PO TABS
20.0000 mg | ORAL_TABLET | Freq: Every day | ORAL | Status: DC
  Administered 2011-11-15 – 2011-11-18 (×4): 20 mg via ORAL
  Administered 2011-11-19 (×2): 10 mg via ORAL
  Administered 2011-11-20 – 2011-12-26 (×37): 20 mg via ORAL
  Filled 2011-11-14 (×47): qty 1

## 2011-11-14 MED ORDER — ONDANSETRON HCL 4 MG PO TABS: 4.0000 mg | ORAL_TABLET | Freq: Four times a day (QID) | ORAL | Status: DC | PRN

## 2011-11-14 MED ORDER — CLONIDINE HCL 0.1 MG/24HR TD PTWK
0.1000 mg | MEDICATED_PATCH | TRANSDERMAL | Status: DC
  Administered 2011-11-15 – 2011-12-20 (×6): 0.1 mg via TRANSDERMAL
  Filled 2011-11-14 (×9): qty 1

## 2011-11-14 MED ORDER — PANTOPRAZOLE SODIUM 20 MG PO TBEC
20.0000 mg | DELAYED_RELEASE_TABLET | Freq: Every day | ORAL | Status: DC
  Administered 2011-11-15 – 2011-11-22 (×8): 20 mg via ORAL
  Filled 2011-11-14 (×10): qty 1

## 2011-11-14 MED ORDER — METHYLPHENIDATE HCL 5 MG PO TABS
5.0000 mg | ORAL_TABLET | Freq: Every day | ORAL | Status: DC
  Administered 2011-11-15 – 2011-11-16 (×2): 5 mg via ORAL
  Filled 2011-11-14 (×2): qty 1

## 2011-11-14 MED ORDER — AMLODIPINE BESYLATE 10 MG PO TABS
10.0000 mg | ORAL_TABLET | Freq: Every day | ORAL | Status: DC
  Administered 2011-11-15 – 2011-12-26 (×42): 10 mg via ORAL
  Filled 2011-11-14 (×45): qty 1

## 2011-11-14 MED ORDER — METHYLPHENIDATE HCL 5 MG PO TABS: 5.0000 mg | ORAL_TABLET | Freq: Every day | ORAL | Status: DC

## 2011-11-14 MED ORDER — HYDROCHLOROTHIAZIDE 25 MG PO TABS
25.0000 mg | ORAL_TABLET | Freq: Every day | ORAL | Status: DC
  Filled 2011-11-14 (×2): qty 1

## 2011-11-14 MED ORDER — POLYETHYLENE GLYCOL 3350 17 G PO PACK
17.0000 g | PACK | Freq: Every day | ORAL | Status: DC | PRN
  Administered 2011-11-22: 17 g via ORAL
  Filled 2011-11-14 (×3): qty 1

## 2011-11-14 MED ORDER — CLONIDINE HCL 0.1 MG/24HR TD PTWK
0.1000 mg | MEDICATED_PATCH | TRANSDERMAL | Status: DC
  Filled 2011-11-14: qty 1

## 2011-11-14 NOTE — Progress Notes (Signed)
Speech Language Pathology  Patient Details Name: Anthony Hardin MRN: [id number] DOB: 18-Mar-1950 Today's Date: 11/14/2011 Time:  -    Pt. Just finished session with PT/OT and is currently resting.   Scheduled to go to inpatient rehab today.  SLP on CIR will reassess current cognitive-speech abilities and continue to follow for dysphagia and upgrade to regular when appropriate. Pt. Has been safe on Dys 3 and thin liquids.        [name] Moore.Ed [employer] [phone]  11/14/2011

## 2011-11-14 NOTE — Discharge Summary (Signed)
Stroke Discharge Summary  Patient ID: Anthony Hardin   MRN: [id number]      DOB: 1950-06-28  Date of Admission: 10/30/2011 Date of Discharge: 11/14/2011  Attending Physician:  Darcella Cheshire, MD, Stroke MD  Consulting Physician(s):  Treatment Team:  Md Stroke, MD  Patient's PCP: Unknown  Discharge Diagnoses:  Principal Problem:  *Intracerebral hemorrhage Active Problems:  Hypertension, malignant  Hemiplegia, unspecified, affecting nondominant side BMI  Body mass index is 25.36 kg/(m^2).  Past Medical History  Diagnosis Date  . Hypertension    History reviewed. No pertinent past surgical history.  Medications to be continued on Rehab    . amLODipine  10 mg Oral Daily  . antiseptic oral rinse  15 mL Mouth Rinse BID  . cloNIDine  0.1 mg Transdermal Weekly  . enoxaparin (LOVENOX) injection  40 mg Subcutaneous Q24H  . hydrochlorothiazide  25 mg Oral Daily  . lisinopril  10 mg Oral QHS  . lisinopril  20 mg Oral Daily  . methylphenidate  5 mg Oral Q0600  . methylphenidate  5 mg Oral Daily  . pantoprazole  20 mg Oral Q1200  . potassium chloride  10 mEq Oral Daily  . senna-docusate  1 tablet Oral BID    LABORATORY STUDIES CBC    Component Value Date/Time   WBC 11.1* 11/10/2011 1134   RBC 5.12 11/10/2011 1134   HGB 15.8 11/10/2011 1134   HCT 43.9 11/10/2011 1134   PLT 426* 11/10/2011 1134   MCV 85.7 11/10/2011 1134   MCH 30.9 11/10/2011 1134   MCHC 36.0 11/10/2011 1134   RDW 12.5 11/10/2011 1134   LYMPHSABS 1.5 11/06/2011 0400   MONOABS 1.2* 11/06/2011 0400   EOSABS 0.2 11/06/2011 0400   BASOSABS 0.0 11/06/2011 0400   CMP    Component Value Date/Time   NA 132* 11/10/2011 1134   K 4.1 11/10/2011 1134   CL 93* 11/10/2011 1134   CO2 26 11/10/2011 1134   GLUCOSE 145* 11/10/2011 1134   BUN 24* 11/10/2011 1134   CREATININE 1.15 11/10/2011 1134   CALCIUM 10.7* 11/10/2011 1134   PROT 8.0 10/30/2011 2255   ALBUMIN 4.6 10/30/2011 2255   AST 46* 10/30/2011 2255   ALT 28  10/30/2011 2255   ALKPHOS 55 10/30/2011 2255   BILITOT 1.5* 10/30/2011 2255   GFRNONAA 68* 11/10/2011 1134   GFRAA 79* 11/10/2011 1134   COAGS Lab Results  Component Value Date   INR 1.02 10/30/2011   Lipid Panel    Component Value Date/Time   CHOL 181 11/06/2011 0400   TRIG 102 11/06/2011 0400   HDL 46 11/06/2011 0400   CHOLHDL 3.9 11/06/2011 0400   VLDL 20 11/06/2011 0400   LDLCALC 115* 11/06/2011 0400   HgbA1C  Lab Results  Component Value Date   HGBA1C 5.3 11/06/2011   Cardiac Panel (last 3 results) No results found for this basename: CKTOTAL:3,CKMB:3,TROPONINI:3,RELINDX:3 in the last 72 hours Urinalysis    Component Value Date/Time   COLORURINE YELLOW 11/02/2011 0959   APPEARANCEUR CLEAR 11/02/2011 0959   LABSPEC 1.014 11/02/2011 0959   PHURINE 6.5 11/02/2011 0959   GLUCOSEU 100* 11/02/2011 0959   HGBUR NEGATIVE 11/02/2011 0959   BILIRUBINUR NEGATIVE 11/02/2011 0959   KETONESUR 15* 11/02/2011 0959   PROTEINUR NEGATIVE 11/02/2011 0959   UROBILINOGEN 1.0 11/02/2011 0959   NITRITE NEGATIVE 11/02/2011 0959   LEUKOCYTESUR NEGATIVE 11/02/2011 0959   Urine Drug Screen     Component Value Date/Time   LABOPIA NONE  DETECTED 10/31/2011 1110   COCAINSCRNUR NONE DETECTED 10/31/2011 1110   LABBENZ NONE DETECTED 10/31/2011 1110   AMPHETMU NONE DETECTED 10/31/2011 1110   THCU NONE DETECTED 10/31/2011 1110   LABBARB NONE DETECTED 10/31/2011 1110     SIGNIFICANT DIAGNOSTIC STUDIES  CT of the brain   11/04/2011 Stable large hemorrhage involving right thalamus with stable surrounding vasogenic edema. Stable to minimally more prominent mass effect on the third ventricle, with minimal increased dilatation of the third ventricle and temporal horns of the lateral ventricles. Stable bifrontal subarachnoid hemorrhage.   11/03/2011 No significant change seen involving large intraparenchymal hemorrhage in the right thalamus compared to prior exam. There does appear to be a decreased amount of hemorrhage within  the right lateral ventricle compared to prior exam. No change is seen involving small amount of hemorrhage in right occipital horn or small amount of subarachnoid hemorrhage noted previously   11/01/2011 Right thalamic hemorrhage with ventricular extension. No significant change from 10/30/2011.   10/30/2011 Acute intraparenchymal hemorrhage in the right thalamus extending to the right lateral ventricle. Surrounding edema and mild mass effect without midline shift.     MRI of the brain 11/01/2011 4.1 x 2.5 cm acute hematoma with the epicenter in the right thalamus as outlined above. Surrounding edema. Mass effect with displacement distortion of the third ventricle but no suspicion of obstructive hydrocephalus at this moment. Small amount of intraventricular blood.   MRA of the brain 11/01/2011 No evidence of high flow vascular malformation. No large or medium vessel occlusion, correctable stenosis or aneurysm.    CXR  11/03/2011 Low volume film with mild cardiomegaly. No acute cardiopulmonary finding.  10/31/2011 No acute abnormality.   EKG NSR.   EEG 11/01/2011 no seziure   History of Present Illness   Anthony Hardin is an 61 y.o. male who was last seen normal on 8/16 after work. Patient reports that he began to feel numbness and weakness on his left side on Saturday. Reports that he was unable to move and in attempting to move fell out of the bed and onto the floor. Was unable to get off the floor and was found by co-workers today. EMS was called and patient was brought in for evaluation. Hypertension likely cause of hemorrhage. (233/103) CT showed acute intraparenchymal hemorrhage in the right thalamus extending to the right lateral ventricle. Surrounding edema and mild mass effect without midline shift.   LSN: 10/27/2011  tPA Given: No: [facility]  Hospital Course   Anthony Hardin is a 61 y.o. male with actue intraparenchymal hemorrhage in the right thalamus with mild cytotoxic edema  but no significant hydrocephalus.Neurosurgery consulted for possible ventriculostomy but felt not to be a candidate Hemorrhage felt to be due to malignant hypertension. Now off cardene gtt. BP meds now include norvasc 10mg  daily, HCTZ 25mg  daily, Lisinopril 20mg  qam and 10mg  qpm and clonidine 0.1 patch weekly.   Patient now with resultant left hemiparesis and lethargy. Therapies are recommending inpatient rehabilitation and this is where he will be discharge today. He remains medically ready for discharge.   Of note when he fell out of bed and later found he did exhibit rhabdomyolysis, CK high on admission. Resolved  Patient has Stroke risk factors of hypertension  Physical therapy, occupational therapy and speech therapy evaluated patient. All agreed inpatient rehab is needed. Patient's family is/are supportive and agree with discharge to CIR. CIR bed is available today and patient will be transferred there.  Discharge Exam  Blood pressure  127/62, pulse 67, temperature 97.7 F (36.5 C), temperature source Oral, resp. rate 20, height 5\' 9"  (1.753 m), weight 77.9 kg (171 lb 11.8 oz), SpO2 97.00%.   Discharge Diet  Dysphagia 3 thins liquids  Discharge Plan - Disposition:  Transfer to [facility]patient [facility] for ongoing PT, OT and ST - No antiplatelets at present due to recent homorrhage for secondary stroke prevention. He could potentially be rescanned and perhaps started on antiplatelet therapy over time. This would need to be further planned and discussed with Dr. Pearlean Brownie as patients rehab progresses. - Ongoing risk factor control by Primary Care Physician. - Risk factor recommendations:  Hypertension target range 130-140/70-80 Lipid range - LDL < 100 and checked every 6 months, fasting Diabetes - HgB A1C <7 Smoking cessation - Follow-up with a primary care provider in 1 month. - Follow-up with Dr. Delia Heady in 2 months.  Signed  Guy Franco, PAC,  MBA, MHA [facility] Stroke  [facility] Pager: [phone] 11/14/2011 1:45 PM  Scribe for Dr. Delia Heady, Stroke Center Medical Director. He has personally reviewed chart, pertinent data, examined the patient and developed the plan of care. Pager:  [phone]

## 2011-11-14 NOTE — Clinical Social Work Note (Signed)
Pt will be discharged to CIR today. Per CIR admissions coordinator, Ezequiel Essex is aware. CSW will inform [facility]. CSW is signing off as no further needs identified.   Dede Query, MSW, Theresia Majors [phone]

## 2011-11-14 NOTE — H&P (Signed)
Physical Medicine and Rehabilitation Admission H&P    Chief Complaint  Patient presents with  . Cerebrovascular Accident  : HPI:Anthony Hardin is a 61 y.o. right-handed male with history of hypertension. 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 Admitted 10/31/2011 with diffuse weakness and unable to move and fell out of bed onto the floor. MRI of the brain showed a 4 x 1 x 2.5 cm acute hematoma right thalamus with mass effect and displacement distortion of the third ventricle but no suspicion of obstructive hydrocephalus. MRA of the head with no stenosis or occlusion. Close monitoring of blood pressure with intravenous Cardene that was slowly weaned and transition to by mouth medications. Neurosurgery Dr. David Jones consulted and advise conservative care with followup serial CT scans. Latest cranial CT scan 11/04/2011 showed stable left hemorrhage involving right thalamus with stable surrounding vasogenic edema. Noted bouts of diplopia as well as left inattention. Trial of Ritalin was added 11/11/2011 to enable patient to attend better to tasks. Subcutaneous Lovenox was initiated 10/31/2011 for DVT prophylaxis. Followup speech therapy for questionable dysphagia maintain on mechanical soft diet with thin liquids. Physical and occupational therapy evaluations completed and ongoing with recommendations of physical medicine rehabilitation consult to consider inpatient rehabilitation services. Patient was felt to be a good candidate for inpatient rehabilitation services and was admitted for  a comprehensive rehabilitation program   Review of Systems  Eyes: Positive for blurred vision and double vision.  Neurological: Positive for dizziness and headaches.  All other systems reviewed and are negative   Past Medical History  Diagnosis Date  . Hypertension    History reviewed. No pertinent past surgical history. History reviewed. No pertinent family history. Social History:  reports  that he has never smoked. He has never used smokeless tobacco. He reports that he drinks alcohol. He reports that he does not use illicit drugs. Allergies: No Known Allergies Medications Prior to Admission  Medication Sig Dispense Refill  . beta carotene w/minerals (OCUVITE) tablet Take 1 tablet by mouth daily.        Home: Home Living Lives With: Alone Available Help at Discharge: Other (Comment) Type of Home: House Home Access: Stairs to enter Entrance Stairs-Number of Steps: 2 Entrance Stairs-Rails: Right Home Layout: Two level;Able to live on main level with bedroom/bathroom;1/2 bath on main level Bathroom Shower/Tub: Tub/shower unit;Curtain Bathroom Toilet: Standard Home Adaptive Equipment: Hand-held shower hose   Functional History: Prior Function Able to Take Stairs?: Yes Driving: Yes Vocation: Full time employment  Functional Status:  Mobility: Bed Mobility Bed Mobility: Supine to Sit Rolling Right: 2: Max assist Right Sidelying to Sit: 2: Max assist Supine to Sit: 3: Mod assist Sitting - Scoot to Edge of Bed: 4: Min guard Sit to Supine: 1: +2 Total assist;HOB flat Sit to Supine: Patient Percentage: 40% Sit to Sidelying Right: 4: Min assist (increased time, and faciliation of Lt. LE) Transfers Transfers: Sit to Stand;Stand to Sit;Stand Pivot Transfers Sit to Stand: 3: Mod assist;With upper extremity assist;From bed Sit to Stand: Patient Percentage: 70% Stand to Sit: 3: Mod assist;With upper extremity assist;To chair/3-in-1 Stand to Sit: Patient Percentage: 70% Stand Pivot Transfers: 1: +2 Total assist Stand Pivot Transfers: Patient Percentage: 40% Ambulation/Gait Ambulation/Gait Assistance: Not tested (comment) Stairs: No Wheelchair Mobility Wheelchair Mobility: No  ADL: ADL Eating/Feeding: Maximal assistance (To drink from cup with Lt (hemi) UE) Where Assessed - Eating/Feeding: Bed level Grooming: Performed;Shaving;Wash/dry face;Maximal  assistance;Minimal assistance (see adl section for details) Where Assessed -   Grooming: Supported sitting Lower Body Dressing: Minimal assistance (to don Rt. sock and max instructional cues) Where Assessed - Lower Body Dressing: Supine, head of bed up Toilet Transfer: Simulated;+2 Total assistance Toilet Transfer Method: Stand pivot;Sit to stand Toilet Transfer Equipment: Other (comment) (to recliner) Equipment Used: Gait belt Transfers/Ambulation Related to ADLs: Total A +2 for transfer bed to recliner.  Pt. 40-70%.   Pt. "gets stuck" in middle of transfer, and has difficulty re-initiating and motor planning movement.  During that time, pt. pushes heavily to Lt. until he is able to reinitiate activity, then he is able to perform ~70%.   ADL Comments: Pt recognized therapist upon entry.  He was able to recall earlier session and what we worked on.  He was also able to recall conversation in regards to CVA and deficits.   Pt. noted with spontaneous thumb extension this pm when discussing Lt UE.  Also able to release cylindrical object with full active extension of digits Lt. hand after signficant delay.    With person on his Rt, pt. able to attend to therapist on his Lt. with minimal cuing  Cognition: Cognition Overall Cognitive Status: Impaired Arousal/Alertness: Awake/alert Orientation Level: Oriented X4 Attention: Focused;Sustained Focused Attention: Appears intact Sustained Attention: Impaired Sustained Attention Impairment: Verbal basic;Functional basic Memory: Appears intact (long term and basic short term appears intact) Awareness: Impaired Awareness Impairment: Emergent impairment;Intellectual impairment (albe to verbalize deficits but not how they impact function) Problem Solving: Impaired Problem Solving Impairment: Functional basic Executive Function: Initiating;Self Monitoring;Self Correcting Initiating: Impaired Initiating Impairment: Functional basic Self Monitoring:  Impaired Self Monitoring Impairment: Functional basic Self Correcting: Impaired Self Correcting Impairment: Functional basic Behaviors: Impulsive Safety/Judgment: Impaired Comments: decreased safety awareness Cognition Overall Cognitive Status: Impaired Area of Impairment: Attention;Following commands;Safety/judgement;Awareness of deficits;Problem solving Arousal/Alertness: Awake/alert Orientation Level: Appears intact for tasks assessed Behavior During Session: WFL for tasks performed Current Attention Level: Sustained Attention - Other Comments: Attention very difficult to accurately asses due to significance of motor planning and initiation deficits.  Pt. with sometimes a 30-45 second delay and will at times appear that he has lost focus/attention only to execute movement, or answer question asked. Following Commands: Follows one step commands with increased time Safety/Judgement: Decreased awareness of safety precautions Awareness of Errors: Assistance required to identify errors made;Assistance required to correct errors made Awareness of Errors - Other Comments: Pt. is able to verbalize deficits, but unable to indicate how they affect him functionally Awareness of Deficits: Patient starting to gain concept of his deficits.  Problem Solving: difficult to accurately asses due to severity of initiation deficits Cognition - Other Comments: Pt. maintained arrousal throughout entire session   Blood pressure 127/82, pulse 62, temperature 98.2 F (36.8 C), temperature source Oral, resp. rate 16, height 5' 9" (1.753 m), weight 77.9 kg (171 lb 11.8 oz), SpO2 100.00%. Physical Exam  Vitals reviewed.  Constitutional: He appears well-developed.  Eyes:  Pupils are round and reactive to light  Neck: Neck supple. No thyromegaly present.  Cardiovascular: Normal rate and regular rhythm.  Pulmonary/Chest: Effort normal and breath sounds normal. No respiratory distress. He has no wheezes.   Abdominal: Bowel sounds are normal. He exhibits no distension. There is no tenderness.  Neurological:  Patient is sitting up the edge the bed. Noted left-sided inattention. He was able to name person place as well as occupation. He told me the month after one mistake and missed the day by one day. He followed simple commands but needs some cues to   maintain completing task. He is quite distractable.frequently uses humor to try to avoid answering a question.  He was able to see objects on the left peripherally, but had more difficulties with objects in the LLQ.  Skin: Skin is warm and dry.  motor strength is 0/5 in the left deltoid, biceps, triceps, grip. Inconsistent 1-2/5 hip flexor,1-2/5 knee extensors, ankle dorsiflexor  5/5 in the right deltoid, biceps, triceps, grip, hip flexor, knee extensors, ankle dorsiflexors  He did not sense light touch, but sensed pain in the left upper and lower. DTR's are 3+ throughout but there was no resting tone in the left upper and left lower extremity   No results found for this or any previous visit (from the past 48 hour(s)). No results found.  Post Admission Physician Evaluation: 1. Functional deficits secondary  to right thalamic hemorrhage with left hemiplegia and severe left visuospatial deficits. . 2. Patient is admitted to receive collaborative, interdisciplinary care between the physiatrist, rehab nursing staff, and therapy team. 3. Patient's level of medical complexity and substantial therapy needs in context of that medical necessity cannot be provided at a lesser intensity of care such as a SNF. 4. Patient has experienced substantial functional loss from his/her baseline which was documented above under the "Functional History" and "Functional Status" headings.  Judging by the patient's diagnosis, physical exam, and functional history, the patient has potential for functional progress which will result in measurable gains while on inpatient rehab.   These gains will be of substantial and practical use upon discharge  in facilitating mobility and self-care at the household level. 5. Physiatrist will provide 24 hour management of medical needs as well as oversight of the therapy plan/treatment and provide guidance as appropriate regarding the interaction of the two. 6. 24 hour rehab nursing will assist with bladder management, bowel management, safety, skin/wound care, disease management, medication administration, pain management and patient education  and help integrate therapy concepts, techniques,education, etc. 7. PT will assess and treat for:  Visual-spatial deficits, NMR, strength, adaptive equiopment training, functional mobility, family ed.  Goals are: supervision to min assist. 8. OT will assess and treat for: upper ext strength, ADL's, NMR, visual-spatial deficits, adaptive equipment, family ed.   Goals are: minimal assistance plus. 9. SLP will assess and treat for: cognition, communication.  Goals are: supervision to min assist. 10. Case Management and Social Worker will assess and treat for psychological issues and discharge planning. 11. Team conference will be held weekly to assess progress toward goals and to determine barriers to discharge. 12. Patient will receive at least 3 hours of therapy per day at least 5 days per week. 13. ELOS: 3-4 weeks      Prognosis:  good   Medical Problem List and Plan: 1. 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 DVT Prophylaxis/Anticoagulation: Subcutaneous Lovenox initiated 10/31/2011. Monitor platelet counts and any signs of bleeding 3. Mood: Continue Ritalin. Check sleep chart 4. Neuropsych: This patient is not capable of making decisions on his/her own behalf at this time.. 5. Hypertension. Norvasc 10 mg daily, clonidine patch 0.1 mg weekly, hydrochlorothiazide 25 mg daily, lisinopril 20 mg a.m. and 10 mg at bedtime. Monitor with increased  mobility 6. Dysphagia. Mechanical soft diet thin liquids. Speech therapy followup. Monitor for any signs of aspiration. Need to watch for inappropriate drinking/swallowing technique due to his impulsivity.   11/14/2011, Zach Kimberly Coye, MD 

## 2011-11-14 NOTE — Progress Notes (Signed)
Patient functionally ready to admit to inpt rehab today. I have discussed with brother at bedside and sister by phone. [employer] is aware. Please call for any questions. [phone].

## 2011-11-14 NOTE — H&P (View-Only) (Signed)
Physical Medicine and Rehabilitation Admission H&P    Chief Complaint  Patient presents with  . Cerebrovascular Accident  : Anthony Hardin is a 61 y.o. right-handed male with history of hypertension. 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 Admitted 10/31/2011 with diffuse weakness and unable to move and fell out of bed onto the floor. MRI of the brain showed a 4 x 1 x 2.5 cm acute hematoma right thalamus with mass effect and displacement distortion of the third ventricle but no suspicion of obstructive hydrocephalus. MRA of the head with no stenosis or occlusion. Close monitoring of blood pressure with intravenous Cardene that was slowly weaned and transition to by mouth medications. Neurosurgery Dr. Marikay Alar consulted and advise conservative care with followup serial CT scans. Latest cranial CT scan 11/04/2011 showed stable left hemorrhage involving right thalamus with stable surrounding vasogenic edema. Noted bouts of diplopia as well as left inattention. Trial of Ritalin was added 11/11/2011 to enable patient to attend better to tasks. Subcutaneous Lovenox was initiated 10/31/2011 for DVT prophylaxis. Followup speech therapy for questionable dysphagia maintain on mechanical soft diet with thin liquids. Physical and occupational therapy evaluations completed and ongoing with recommendations of physical medicine rehabilitation consult to consider inpatient rehabilitation services. Patient was felt to be a good candidate for inpatient rehabilitation services and was admitted for  a comprehensive rehabilitation program   Review of Systems  Eyes: Positive for blurred vision and double vision.  Neurological: Positive for dizziness and headaches.  All other systems reviewed and are negative   Past Medical History  Diagnosis Date  . Hypertension    History reviewed. No pertinent past surgical history. History reviewed. No pertinent family history. Social History:  reports  that he has never smoked. He has never used smokeless tobacco. He reports that he drinks alcohol. He reports that he does not use illicit drugs. Allergies: No Known Allergies Medications Prior to Admission  Medication Sig Dispense Refill  . beta carotene w/minerals (OCUVITE) tablet Take 1 tablet by mouth daily.        Home: Home Living Lives With: Alone Available Help at Discharge: Other (Comment) Type of Home: House Home Access: Stairs to enter [employer] of Steps: 2 Entrance Stairs-Rails: Right Home Layout: Two level;Able to live on main level with bedroom/bathroom;1/2 bath on main level Bathroom Shower/Tub: Tub/shower unit;Curtain Bathroom Toilet: Standard Home Adaptive Equipment: Hand-held shower hose   Functional History: Prior Function Able to Take Stairs?: Yes Driving: Yes Vocation: Full time employment  Functional Status:  Mobility: Bed Mobility Bed Mobility: Supine to Sit Rolling Right: 2: Max assist Right Sidelying to Sit: 2: Max assist Supine to Sit: 3: Mod assist Sitting - Scoot to [place] of Bed: 4: Min guard Sit to Supine: 1: +2 Total assist;HOB flat Sit to Supine: Patient Percentage: 40% Sit to Sidelying Right: 4: Min assist (increased time, and faciliation of Lt. LE) Transfers Transfers: Sit to Stand;Stand to [employer] Transfers Sit to Stand: 3: Mod assist;With upper extremity assist;From bed Sit to Stand: Patient Percentage: 70% Stand to Sit: 3: Mod assist;With upper extremity assist;To chair/3-in-1 Stand to Sit: Patient Percentage: 70% Stand Pivot Transfers: 1: +2 Total assist Stand Pivot Transfers: Patient Percentage: 40% Ambulation/Gait Ambulation/Gait Assistance: Not tested (comment) Stairs: No Wheelchair Mobility Wheelchair Mobility: No  ADL: ADL Eating/Feeding: Maximal assistance (To drink from cup with Lt (hemi) UE) Where Assessed - Eating/Feeding: Bed level Grooming: Performed;Shaving;Wash/dry face;Maximal  assistance;Minimal assistance (see adl section for details) Where Assessed -  Grooming: Supported sitting Lower Body Dressing: Minimal assistance (to don Rt. sock and max instructional cues) Where Assessed - Lower Body Dressing: Supine, head of bed up Toilet Transfer: Simulated;+2 Total assistance Toilet Transfer Method: Stand pivot;Sit to [employer]ista: Other (comment) (to recliner) Equipment Used: Gait belt Transfers/Ambulation Related to ADLs: Total A +2 for transfer bed to recliner.  Pt. 40-70%.   Pt. "gets stuck" in middle of transfer, and has difficulty re-initiating and motor planning movement.  During that time, pt. pushes heavily to Lt. until he is able to reinitiate activity, then he is able to perform ~70%.   ADL Comments: Pt recognized therapist upon entry.  He was able to recall earlier session and what we worked on.  He was also able to recall conversation in regards to CVA and deficits.   Pt. noted with spontaneous thumb extension this pm when discussing Lt UE.  Also able to release cylindrical object with full active extension of digits Lt. hand after signficant delay.    With person on his Rt, pt. able to attend to therapist on his Lt. with minimal cuing  Cognition: Cognition Overall Cognitive Status: Impaired Arousal/Alertness: Awake/alert Orientation Level: Oriented X4 Attention: Focused;Sustained Focused Attention: Appears intact Sustained Attention: Impaired Sustained Attention Impairment: Verbal basic;Functional basic Memory: Appears intact (long term and basic short term appears intact) Awareness: Impaired Awareness Impairment: Emergent impairment;Intellectual impairment (albe to verbalize deficits but not how they impact function) Problem Solving: Impaired Problem Solving Impairment: Functional basic Executive Function: Initiating;Self Monitoring;Self Correcting Initiating: Impaired Initiating Impairment: Functional basic Self Monitoring:  Impaired Self Monitoring Impairment: Functional basic Self Correcting: Impaired Self Correcting Impairment: Functional basic Behaviors: Impulsive Safety/Judgment: Impaired Comments: decreased safety awareness Cognition Overall Cognitive Status: Impaired Area of Impairment: Attention;Following commands;Safety/judgement;Awareness of deficits;Problem solving Arousal/Alertness: Awake/alert Orientation Level: Appears intact for tasks assessed Behavior During Session: [facility] Hights for tasks performed Current Attention Level: Sustained Attention - Other Comments: Attention very difficult to accurately asses due to significance of motor planning and initiation deficits.  Pt. with sometimes a 30-45 second delay and will at times appear that he has lost focus/attention only to execute movement, or answer question asked. Following Commands: Follows one step commands with increased time Safety/Judgement: Decreased awareness of safety precautions Awareness of Errors: Assistance required to identify errors made;Assistance required to correct errors made Awareness of Errors - Other Comments: Pt. is able to verbalize deficits, but unable to indicate how they affect him functionally Awareness of Deficits: Patient starting to gain concept of his deficits.  Problem Solving: difficult to accurately asses due to severity of initiation deficits Cognition - Other Comments: Pt. maintained arrousal throughout entire session   Blood pressure 127/82, pulse 62, temperature 98.2 F (36.8 C), temperature source Oral, resp. rate 16, height 5\' 9"  (1.753 m), weight 77.9 kg (171 lb 11.8 oz), SpO2 100.00%. Physical Exam  Vitals reviewed.  Constitutional: He appears well-developed.  Eyes:  Pupils are round and reactive to light  Neck: Neck supple. No thyromegaly present.  Cardiovascular: Normal rate and regular rhythm.  Pulmonary/Chest: Effort normal and breath sounds normal. No respiratory distress. He has no wheezes.   Abdominal: Bowel sounds are normal. He exhibits no distension. There is no tenderness.  Neurological:  Patient is sitting up the edge the bed. Noted left-sided inattention. He was able to name person place as well as occupation. He told me the month after one mistake and missed the day by one day. He followed simple commands but needs some cues to  maintain completing task. He is quite distractable.frequently uses humor to try to avoid answering a question.  He was able to see objects on the left peripherally, but had more difficulties with objects in the LLQ.  Skin: Skin is warm and dry.  motor strength is 0/5 in the left deltoid, biceps, triceps, grip. Inconsistent 1-2/5 hip flexor,1-2/5 knee extensors, ankle dorsiflexor  5/5 in the right deltoid, biceps, triceps, grip, hip flexor, knee extensors, ankle dorsiflexors  He did not sense light touch, but sensed pain in the left upper and lower. DTR's are 3+ throughout but there was no resting tone in the left upper and left lower extremity   No results found for this or any previous visit (from the past 48 hour(s)). No results found.  Post Admission Physician Evaluation: 1. Functional deficits secondary  to right thalamic hemorrhage with left hemiplegia and severe left visuospatial deficits. . 2. Patient is admitted to receive collaborative, interdisciplinary care between the physiatrist, rehab nursing staff, and therapy team. 3. Patient's level of medical complexity and substantial therapy needs in context of that medical necessity cannot be provided at a lesser intensity of care such as a SNF. 4. Patient has experienced substantial functional loss from his/her baseline which was documented above under the "Functional History" and "Functional Status" headings.  Judging by the patient's diagnosis, physical exam, and functional history, the patient has potential for functional progress which will result in measurable gains while on inpatient rehab.   These gains will be of substantial and practical use upon discharge  in facilitating mobility and self-care at the household level. 5. Physiatrist will provide 24 hour management of medical needs as well as oversight of the therapy plan/treatment and provide guidance as appropriate regarding the interaction of the two. 6. 24 hour rehab nursing will assist with bladder management, bowel management, safety, skin/wound care, disease management, medication administration, pain management and patient education  and help integrate therapy concepts, techniques,education, etc. 7. PT will assess and treat for:  Visual-spatial deficits, NMR, strength, adaptive equiopment training, functional mobility, family ed.  Goals are: supervision to min assist. 8. OT will assess and treat for: upper ext strength, ADL's, NMR, visual-spatial deficits, adaptive equipment, family ed.   Goals are: minimal assistance plus. 9. SLP will assess and treat for: cognition, communication.  Goals are: supervision to min assist. 10. Case Management and Social Worker will assess and treat for psychological issues and discharge planning. 11. Team conference will be held weekly to assess progress toward goals and to determine barriers to discharge. 12. Patient will receive at least 3 hours of therapy per day at least 5 days per week. 13. ELOS: 3-4 weeks      Prognosis:  good   Medical Problem List and Plan: 1. 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 DVT Prophylaxis/Anticoagulation: Subcutaneous Lovenox initiated 10/31/2011. Monitor platelet counts and any signs of bleeding 3. Mood: Continue Ritalin. Check sleep chart 4. Neuropsych: This patient is not capable of making decisions on his/her own behalf at this time.. 5. Hypertension. Norvasc 10 mg daily, clonidine patch 0.1 mg weekly, hydrochlorothiazide 25 mg daily, lisinopril 20 mg a.m. and 10 mg at bedtime. Monitor with increased  mobility 6. Dysphagia. Mechanical soft diet thin liquids. Speech therapy followup. Monitor for any signs of aspiration. Need to watch for inappropriate drinking/swallowing technique due to his impulsivity.   11/14/2011, Ivory Broad, MD

## 2011-11-14 NOTE — Progress Notes (Signed)
Occupational Therapy Treatment Patient Details Name: Anthony Hardin MRN: [id number] DOB: Jul 11, 1950 Today's Date: 11/14/2011 Time: 7829-5621 OT Time Calculation (min): 29 min  OT Assessment / Plan / Recommendation Comments on Treatment Session Pt. continues to make excellent progress.  Awareness of deficits continues to improve.  Pt. grasped object spontaneously today with Lt. UE.      Follow Up Recommendations  Inpatient Rehab    Barriers to Discharge       Equipment Recommendations  Defer to next venue    Recommendations for Other Services    Frequency Min 3X/week   Plan Discharge plan remains appropriate    Precautions / Restrictions Precautions Precautions: Fall Restrictions Weight Bearing Restrictions: No   Pertinent Vitals/Pain     ADL  Grooming: Performed;Teeth care;Moderate assistance Where Assessed - Grooming: Supported standing Equipment Used: Gait belt Transfers/Ambulation Related to ADLs: Pt. moved sit to stand with mod A, and ambulated to and from bathroom with total A +2 (pt. ~50%) - required assist to advance Lt. Lt. LE ADL Comments: Pt. stood at sink to brush teeth.  He demonstrates decreased throughness, and difficulty dividing attention between maintaining standing and brushing teeth.  WHen pt would correct his balance (leaning Lt), he required physical cues/assist to re-initiate brushing teeth.  Pt. also with difficulty sequencing toothbrushing activity - required moderate verbal cues/instruction.  Pt. used Lt. UE as a support in standing with min facilitation    OT Diagnosis:    OT Problem List:   OT Treatment Interventions:     OT Goals ADL Goals ADL Goal: Grooming - Progress: Progressing toward goals ADL Goal: Toilet Transfer - Progress: Progressing toward goals ADL Goal: Additional Goal #1 - Progress: Progressing toward goals ADL Goal: Additional Goal #2 - Progress: Progressing toward goals Miscellaneous OT Goals OT Goal: Miscellaneous Goal #1 -  Progress: Progressing toward goals OT Goal: Miscellaneous Goal #2 - Progress: Progressing toward goals OT Goal: Miscellaneous Goal #3 - Progress: Discontinued (comment)  Visit Information  Last OT Received On: 11/14/11 PT/OT Co-Evaluation/Treatment: Yes    Subjective Data      Prior Functioning       Cognition  Overall Cognitive Status: Impaired Area of Impairment: Attention;Following commands;Safety/judgement;Awareness of deficits;Problem solving Arousal/Alertness: Awake/alert Orientation Level: Appears intact for tasks assessed Behavior During Session: [facility] for tasks performed Current Attention Level: Sustained Attention - Other Comments: Able to sustain attention to conversation for 6-7 mins.  Following Commands: Follows one step commands with increased time Safety/Judgement: Decreased awareness of safety precautions Awareness of Errors: Assistance required to identify errors made;Assistance required to correct errors made Awareness of Errors - Other Comments: continues to demonstrate improving awareness daily Problem Solving: difficult to accurately asses due to severity of initiation deficits Cognition - Other Comments: Pt. maintained arrousal throughout entire session    Mobility  Shoulder Instructions Bed Mobility Bed Mobility: Supine to Sit Supine to Sit: 4: Min assist Sitting - Scoot to Edge of Bed: 4: Min assist (faciliatation of Lt. hip) Details for Bed Mobility Assistance: Pt. requires physical and verbal cues to move Lt. LE off bed Transfers Transfers: Sit to Stand;Stand to Sit Sit to Stand: 3: Mod assist;With upper extremity assist;From bed Stand to Sit: 3: Mod assist;With upper extremity assist;To chair/3-in-1 Details for Transfer Assistance: Pt. required +2 A to ambulate - see PT note, or ADL comments for details       Exercises      Balance Balance Balance Assessed: Yes Dynamic Standing Balance Dynamic Standing - Balance  Support: Left upper extremity  supported Dynamic Standing - Level of Assistance: 3: Mod assist Dynamic Standing - Balance Activities: Other (comment) (brushing teeth)   End of Session OT - End of Session Equipment Utilized During Treatment: Gait belt Activity Tolerance: Patient tolerated treatment well Patient left: in chair;with call bell/phone within reach;with family/visitor present Nurse Communication: Mobility status  GO     Carmelle Bamberg M 11/14/2011, 10:04 AM

## 2011-11-14 NOTE — Progress Notes (Signed)
Physical Therapy Treatment Patient Details Name: Anthony Hardin MRN: [id number] DOB: 05/14/1950 Today's Date: 11/14/2011 Time: 4098-1191 PT Time Calculation (min): 29 min  PT Assessment / Plan / Recommendation Comments on Treatment Session  Patient continuing to progress today and able to walk for the first time with +2 total assist. Patient gaining awareness to situation and deficits daily and able to carry on a more appropriate conversation. Planning to DC to impatient rehab today,.    Follow Up Recommendations  Inpatient Rehab    Barriers to Discharge        Equipment Recommendations  Defer to next venue    Recommendations for Other Services    Frequency Min 4X/week   Plan Discharge plan remains appropriate;Frequency remains appropriate    Precautions / Restrictions Precautions Precautions: Fall Restrictions Weight Bearing Restrictions: No   Pertinent Vitals/Pain     Mobility  Bed Mobility Bed Mobility: Supine to Sit Supine to Sit: 4: Min assist;With rails Sitting - Scoot to Edge of Bed: 4: Min assist Details for Bed Mobility Assistance: Patient requires A to position LLE out of bed. Able to bend LLE today to assist. A at L hip to initiate scooting Transfers Sit to Stand: 3: Mod assist;From bed;From chair/3-in-1 Stand to Sit: 3: Mod assist;To chair/3-in-1 Details for Transfer Assistance: Patient with assistance to initate stand and ensure balance and to prevent L knee buckling Ambulation/Gait Ambulation/Gait Assistance: 1: +2 Total assist Ambulation/Gait: Patient Percentage: 50% Ambulation Distance (Feet): 15 Feet (x2) Assistive device: 2 person hand held assist Ambulation/Gait Assistance Details: Patient required A to advance and prevent buckling of LLE. Patient able to advance R LE requiring increased assistnace with weight shift and balance to L side.  Gait Pattern: Step-to pattern;Decreased step length - right;Decreased step length - left;Lateral hip  instability;Decreased trunk rotation;Wide base of support Modified Rankin (Stroke Patients Only) Pre-Morbid Rankin Score: No symptoms Modified Rankin: Moderately severe disability    Exercises     PT Diagnosis:    PT Problem List:   PT Treatment Interventions:     PT Goals Acute Rehab PT Goals PT Goal: Supine/Side to Sit - Progress: Progressing toward goal PT Goal: Sit to Stand - Progress: Progressing toward goal PT Transfer Goal: Bed to Chair/Chair to Bed - Progress: Progressing toward goal PT Goal: Ambulate - Progress: Progressing toward goal  Visit Information  Last PT Received On: 11/14/11 Assistance Needed: +2 PT/OT Co-Evaluation/Treatment: Yes    Subjective Data      Cognition  Overall Cognitive Status: Impaired Area of Impairment: Following commands;Attention;Safety/judgement;Awareness of deficits;Problem solving;Awareness of errors Arousal/Alertness: Awake/alert Orientation Level: Appears intact for tasks assessed Behavior During Session: [facility] for tasks performed Current Attention Level: Sustained Attention - Other Comments: Able to sustain attention to conversation for 6-7 mins.  Following Commands: Follows one step commands with increased time Safety/Judgement: Decreased awareness of safety precautions Awareness of Errors: Assistance required to correct errors made;Assistance required to identify errors made Awareness of Errors - Other Comments: continues to become more aware of deficits and situation daily Problem Solving: difficult to accurately asses due to severity of initiation deficits Cognition - Other Comments: Patient able to maintain arousal throughout session.     Balance  Balance Balance Assessed: Yes Static Sitting Balance Static Sitting - Balance Support: No upper extremity supported;Feet supported Static Sitting - Level of Assistance: 6: Modified independent (Device/Increase time) Dynamic Standing Balance Dynamic Standing - Balance Support: Left  upper extremity supported Dynamic Standing - Level of Assistance: 3: Mod assist  Dynamic Standing - Balance Activities: Other (comment) (brushing teeth) Dynamic Standing - Comments: Patient stood x5 mins to brush teeth while working on maintain and achieving balance.   End of Session PT - End of Session Equipment Utilized During Treatment: Gait belt Activity Tolerance: Patient tolerated treatment well Patient left: in chair;with call bell/phone within reach;with family/visitor present Nurse Communication: Mobility status   GP     Fredrich Birks 11/14/2011, 11:56 AM 11/14/2011 Fredrich Birks PTA [phone] pager [phone] office

## 2011-11-14 NOTE — Progress Notes (Signed)
Pt transferred to Rehab from [facility]. Orientated family to Rehab expectation,  schedule, visiting hours, safety plan. etc . Provided diagnostic specific handout.

## 2011-11-14 NOTE — Interval H&P Note (Signed)
Anthony Hardin was admitted today to Inpatient Rehabilitation with the diagnosis of right thalamic hemorrhage.  The patient's history has been reviewed, patient examined, and there is no change in status.  Patient continues to be appropriate for intensive inpatient rehabilitation.  I have reviewed the patient's chart and labs.  Questions were answered to the patient's satisfaction.  Chatham Howington T 11/14/2011, 8:35 PM

## 2011-11-14 NOTE — Progress Notes (Signed)
Stroke Team Progress Note  HISTORY Anthony Hardin is an 61 y.o. male who was last seen normal on 8/16 after work. Patient reports that he began to feel numbness and weakness on his left side. Reports that he was unable to move and in attempting to move fell out of the bed and onto the floor 08/17. Was unable to get off the floor and was found by co-workers 08/19. EMS was called and patient was brought in for evaluation. 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  Surrounding edema and mild mass effect without midline shift.   SUBJECTIVE More awake today. Intermittently confused and inattentive. No complaints.  OBJECTIVE Most recent Vital Signs: Filed Vitals:   11/13/11 2200 11/14/11 0200 11/14/11 0600 11/14/11 0921  BP: 129/82 129/85 127/82 127/62  Pulse: 60 62 62 67  Temp: 98.3 F (36.8 C) 97.2 F (36.2 C) 98.2 F (36.8 C) 97.7 F (36.5 C)  TempSrc:      Resp: 16 16 16 20   Height:      Weight:      SpO2: 98% 100% 100% 97%   MEDICATIONS     . amLODipine  10 mg Oral Daily  . antiseptic oral rinse  15 mL Mouth Rinse BID  . cloNIDine  0.1 mg Transdermal Weekly  . enoxaparin (LOVENOX) injection  40 mg Subcutaneous Q24H  . hydrochlorothiazide  25 mg Oral Daily  . lisinopril  10 mg Oral QHS  . lisinopril  20 mg Oral Daily  . methylphenidate  5 mg Oral Q0600  . methylphenidate  5 mg Oral Daily  . pantoprazole  20 mg Oral Q1200  . potassium chloride  10 mEq Oral Daily  . senna-docusate  1 tablet Oral BID   PRN:  acetaminophen, hydrALAZINE, labetalol, ondansetron (ZOFRAN) IV, zolpidem  Diet:  DYS 3 THIN liquids Activity:  Ambulate  DVT Prophylaxis:  Lovenox 40 mg sq daily   CLINICALLY SIGNIFICANT STUDIES Basic Metabolic Panel:   Lab 11/10/11 1134 11/07/11 1040  NA 132* 138  K 4.1 3.4*  CL 93* 98  CO2 26 29  GLUCOSE 145* 142*  BUN 24* 15  CREATININE 1.15 0.95  CALCIUM 10.7* 10.1  MG -- --  PHOS -- --     CBC:   Lab 11/10/11 1134  WBC 11.1*  NEUTROABS --  HGB 15.8  HCT 43.9  MCV 85.7  PLT 426*   Urinalysis negatvie  Urine Drug Screen:  normal  CT of the brain 11/04/2011 Stable large hemorrhage involving right thalamus with stable surrounding vasogenic edema. Stable to minimally more prominent mass effect on the third ventricle, with minimal increased dilatation of the third ventricle and temporal horns of the lateral ventricles. Stable bifrontal subarachnoid hemorrhage. 11/03/2011 No significant change seen involving large intraparenchymal hemorrhage in the right thalamus compared to prior exam. There does appear to be a decreased amount of hemorrhage within the right lateral ventricle compared to prior exam. No change is seen involving small amount of hemorrhage in right occipital horn or small amount of subarachnoid hemorrhage noted previously 11/01/2011   Right thalamic hemorrhage with ventricular extension.  No significant change from 10/30/2011.   10/30/2011   Acute intraparenchymal hemorrhage in the right thalamus extending to the right lateral ventricle.  Surrounding edema and mild mass effect without midline shift.   MRI of the brain 11/01/2011  4.1 x 2.5 cm acute hematoma with the epicenter in the right thalamus as outlined above.  Surrounding edema.  Mass effect with displacement distortion of the third ventricle but no suspicion of obstructive hydrocephalus at this moment.  Small amount of intraventricular blood.   MRA of the brain 11/01/2011   No evidence of high flow vascular malformation.  No large or medium vessel occlusion, correctable stenosis or aneurysm.     CXR  11/03/2011 Low volume film with mild cardiomegaly. No acute cardiopulmonary finding. 10/31/2011   No acute abnormality.  EKG  NSR.   EEG 11/01/2011 no seziure  Therapy Recommendations PT - CIR; OT -CIR ; ST - CIR.  Physical Exam   GENERAL EXAM: Patient is in no distress  CARDIOVASCULAR: Regular rate and  rhythm, no murmurs, no carotid bruits  NEUROLOGIC: MENTAL STATUS: awake, alert, language fluent, WITH SOME LOGORRHEA. Comprehension intact, naming intact CRANIAL NERVE: Pupils equal and reactive to light, visual fields full to confrontation, SUBJECTIVE DIPLOPIA ON TRACKING PEN WITH BOTH EYES OPEN, IMPROVES WITH CLOSING EITHER EYE. No nystagmus, facial strength symmetric, uvula midline, shoulder shrug symmetric, tongue midline. MOTOR: RUE/RLE 5/5. LUE 2/5, LLE 2/5.  SENSORY: DECR IN LEFT FACE, ARM, LEG. COORDINATION: finger-nose-finger, fine finger movements, heel-shin normal REFLEXES: deep tendon reflexes present and symmetric  ASSESSMENT Anthony Hardin is a 61 y.o. 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 Now off cardene gtt.  Patient with resultant left hemiparesis and lethargy. Therapies are recommending inpatient rehabilitation; decision of SNF vs rehab still pending. Remains medically ready for discharge.  -Neurosurgery consulted for possible ventriculostomy but felt not to be a candidate. Stable from NS standpoint. -Malignant hypertension. Off cardene. Started PO medications 8/21.  But limited po intake due to sleepiness. He is on norvasc 10mg  qday, HCTZ 25mg  qday, clonidine 0.1 mg patch and  Lisinopril 20mg  qam/74m q pm added over weekend -rhabdomyolysis, found down. CK high on admission. Resolved.  Hospital day # 15  TREATMENT/PLAN -plan rehab transfer today  Annie Main, MSN, RN, ANVP-BC, ANP-BC, [facility] Pager: [phone] 11/14/2011 10:39 AM  Scribe for Dr. Delia Heady, Stroke Center Medical Director, who has personally reviewed chart, pertinent data, examined the patient and developed the plan of care. Pager:  [phone]

## 2011-11-15 ENCOUNTER — Inpatient Hospital Stay (HOSPITAL_COMMUNITY): Payer: BC Managed Care – PPO | Admitting: Occupational Therapy

## 2011-11-15 ENCOUNTER — Inpatient Hospital Stay (HOSPITAL_COMMUNITY): Payer: BC Managed Care – PPO | Admitting: *Deleted

## 2011-11-15 ENCOUNTER — Inpatient Hospital Stay (HOSPITAL_COMMUNITY): Payer: BC Managed Care – PPO | Admitting: Speech Pathology

## 2011-11-15 ENCOUNTER — Inpatient Hospital Stay (HOSPITAL_COMMUNITY): Payer: BC Managed Care – PPO | Admitting: Physical Therapy

## 2011-11-15 DIAGNOSIS — I610 Nontraumatic intracerebral hemorrhage in hemisphere, subcortical: Secondary | ICD-10-CM

## 2011-11-15 DIAGNOSIS — Z5189 Encounter for other specified aftercare: Secondary | ICD-10-CM

## 2011-11-15 DIAGNOSIS — I619 Nontraumatic intracerebral hemorrhage, unspecified: Secondary | ICD-10-CM

## 2011-11-15 LAB — COMPREHENSIVE METABOLIC PANEL
ALT: 38 U/L (ref 0–53)
AST: 18 U/L (ref 0–37)
Albumin: 3.5 g/dL (ref 3.5–5.2)
Calcium: 10.4 mg/dL (ref 8.4–10.5)
Creatinine, Ser: 1.19 mg/dL (ref 0.50–1.35)
GFR calc non Af Amer: 65 mL/min — ABNORMAL LOW (ref >90)
Total Protein: 7 g/dL (ref 6.0–8.3)

## 2011-11-15 LAB — CBC WITH DIFFERENTIAL/PLATELET
Hemoglobin: 14.9 g/dL (ref 13.0–17.0)
Lymphs Abs: 2.3 10*3/uL (ref 0.7–4.0)
Monocytes Relative: 10 % (ref 3–12)
Neutro Abs: 3.8 10*3/uL (ref 1.7–7.7)
Neutrophils Relative %: 54 % (ref 43–77)
RBC: 4.96 MIL/uL (ref 4.22–5.81)

## 2011-11-15 MED ORDER — ADULT MULTIVITAMIN W/MINERALS CH
1.0000 | ORAL_TABLET | Freq: Every day | ORAL | Status: DC
  Administered 2011-11-15 – 2011-12-26 (×42): 1 via ORAL
  Filled 2011-11-15 (×47): qty 1

## 2011-11-15 MED ORDER — ENSURE COMPLETE PO LIQD
237.0000 mL | Freq: Two times a day (BID) | ORAL | Status: DC
  Administered 2011-11-15 – 2011-11-16 (×3): 237 mL via ORAL
  Administered 2011-11-17: 12:00:00 via ORAL
  Administered 2011-11-17: 237 mL via ORAL

## 2011-11-15 NOTE — Evaluation (Signed)
Physical Therapy Assessment and Plan  Patient Details  Name: CASTULO SCARPELLI MRN: [id number] Date of Birth: 1950/05/16  PT Diagnosis: Hemiplegia non-dominant and Impaired cognition Rehab Potential: Good ELOS: 3-4 weeks   Today's Date: 11/15/2011 Time: 1030-1130 Time Calculation (min): 60 min  Problem List:  Patient Active Problem List  Diagnosis  . Intracerebral hemorrhage  . Hypertension, malignant  . Hemiplegia, unspecified, affecting nondominant side  . Thalamic hemorrhage    Past Medical History:  Past Medical History  Diagnosis Date  . Hypertension    Past Surgical History: No past surgical history on file.  Assessment & Plan Clinical Impression:Elgin C Rohrman is an 61 y.o. male who was last seen normal on 8/16 after work. Patient reports that he began to feel numbness and weakness on his left side. Reports that he was unable to move and in attempting to move fell out of the bed and onto the floor 08/17. Was unable to get off the floor and was found by co-workers 08/19. EMS was called and patient was brought in for evaluation. 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 Surrounding edema and mild mass effect without midline shift.   Patient currently requires max with mobility secondary to L hemi, decreased attention, and L neglect.  Prior to hospitalization, patient was Independent with all activities and worked full-time as Therapist, music at [employer] and lived with Alone in a [employer].  Home access is 2Stairs to enter.  Patient will benefit from skilled PT intervention to maximize safe functional mobility for planned discharge to either SNF or home with 24hr hired CGs per brother.  Anticipate patient will benefit from follow up HH at discharge.  PT - End of Session Activity Tolerance: Decreased this session Endurance Deficit: Yes PT Assessment Rehab Potential: Good Barriers to Discharge: Decreased  caregiver support PT Plan PT Frequency: 1-2 X/day, 60-90 minutes Estimated Length of Stay: 3-4 weeks PT Treatment/Interventions: Ambulation/gait training;Balance/vestibular training;Cognitive remediation/compensation;Community reintegration;Discharge planning;Disease management/prevention;DME/adaptive equipment instruction;Functional electrical stimulation;Functional mobility training;Neuromuscular re-education;Patient/family education;Splinting/orthotics;Stair training;Therapeutic Activities;Therapeutic Exercise;UE/LE Strength taining/ROM;UE/LE Coordination activities;Visual/perceptual remediation/compensation;Wheelchair propulsion/positioning PT Recommendation Follow Up Recommendations: 24 hour supervision/assistance Equipment Recommended: 3 in 1 bedside comode;Wheelchair (measurements);Wheelchair cushion (measurements)  PT Evaluation Precautions/Restrictions Precautions Precautions: Fall Precaution Comments: pt with L neglect.   Restrictions Weight Bearing Restrictions: No   Pain Pain Assessment Pain Assessment: No/denies pain Home Living/Prior Functioning Home Living Lives With: Alone Available Help at Discharge: Other (Comment) (Unclear per brother.  ) Type of Home: House Home Access: Stairs to enter [employer] of Steps: 2 Entrance Stairs-Rails: Right Home Layout: Two level;Able to live on main level with bedroom/bathroom Bathroom Shower/Tub: Engineer, manufacturing systems: Standard Home Adaptive Equipment: Hand-held shower hose Additional Comments: Per brother pt lives alone and does not have any A locally.  Brother and Sister live in [place] and per brother they are unable for him to stay with either of them.  Brother states that plan will have to be D/C to SNF or hire 24hr CGs if pt can D/C to home.   Prior Function Level of Independence: Independent with homemaking with ambulation Able to Take Stairs?: Yes Driving: Yes Vocation: Full time  employment Vision/Perception  Vision - History Baseline Vision: Wears glasses all the time Patient Visual Report: Diplopia Vision - Assessment Eye Alignment: Impaired (comment) Vision Assessment: Vision impaired - to be further tested in functional context Ocular Range of Motion: Impaired-to be further tested in functional context Alignment/Gaze Preference: Gaze right Saccades:  Impaired - to be further tested in functional context;Decreased speed of saccadic movement Convergence: Impaired - to be further tested in functional context  Cognition Overall Cognitive Status: Impaired Arousal/Alertness: Other (comment) (pt very fatigued and difficult to maintain arousal.  ) Orientation Level: Oriented X4 Attention: Focused Focused Attention: Appears intact Sustained Attention: Impaired Sustained Attention Impairment: Verbal basic;Functional basic Memory: Appears intact (long term and basic short term appears intact) Awareness: Impaired Awareness Impairment: Intellectual impairment;Emergent impairment (difficulty recognizing implications of deficits) Problem Solving: Impaired Problem Solving Impairment: Functional basic Executive Function: Initiating;Self Monitoring;Self Correcting;Sequencing;Organizing;Decision Making Sequencing: Impaired Sequencing Impairment: Verbal complex;Functional basic Organizing: Impaired Organizing Impairment: Verbal complex;Functional basic Decision Making: Impaired Decision Making Impairment: Functional basic;Verbal complex Initiating: Impaired Initiating Impairment: Functional basic Self Monitoring: Impaired Self Monitoring Impairment: Functional basic;Verbal complex Self Correcting: Impaired Self Correcting Impairment: Functional basic Behaviors: Impulsive;Perseveration Safety/Judgment: Impaired Comments: decreased safety awareness with basic awareness of deficits Sensation Sensation Additional Comments: Difficult to assess as pt with focused  attention and unable to stay on task.   Coordination Gross Motor Movements are Fluid and Coordinated: No Fine Motor Movements are Fluid and Coordinated: No Coordination and Movement Description: Pt is L HD w/ L neglect noted, has some grip/exten of digits, some elbow flex w/ gravity assist, little shoulder mvt Motor  Motor Motor: Hemiplegia;Motor apraxia  Mobility Bed Mobility Bed Mobility: Supine to Sit Rolling Right: 2: Max assist Rolling Right Details: Verbal cues for precautions/safety;Verbal cues for sequencing;Verbal cues for technique;Tactile cues for placement Supine to Sit: 3: Mod assist Sitting - Scoot to Edge of Bed: 4: Min assist Sitting - Scoot to [employer] of Bed Details: Verbal cues for sequencing;Tactile cues for placement;Tactile cues for initiation;Tactile cues for sequencing;Tactile cues for weight shifting;Other (comment) (used pad) Transfers Sit to Stand: 2: Max assist;With upper extremity assist;From chair/3-in-1;With armrests Sit to Stand Details (indicate cue type and reason): Max facilitation for blocking L knee, positioning L LE, trunk/hip extension, anterior wt shift over BOS, pt tends to push L, but improved when having pt place his hand on PT Stand to Sit: 2: Max assist;With upper extremity assist;With armrests;To chair/3-in-1 Stand to Sit Details: Max cueing for use of armrest, controlling descent Stand Pivot Transfers: 1: +1 Total assist Stand Pivot Transfer Details (indicate cue type and reason): Pt req Max VC's, TC's for safety, sequencing. Locomotion  Ambulation Ambulation/Gait Assistance: Not tested (comment) Stairs / Additional Locomotion Ramp: Not tested (comment) Curb: Not tested (comment)  Trunk/Postural Assessment     Balance Static Standing Balance Static Standing - Balance Support: Right upper extremity supported Static Standing - Level of Assistance: 2: Max assist Static Standing - Comment/# of Minutes: pt able to stand for ~1-64mins for 5  trials.  pt requiring MaxA and facilitation for trunk/hip extension, anterior wt shift over BOS, attending to task, attending to L side.  L LE blocked to prevent buckling.   Extremity Assessment  RUE Assessment RUE Assessment: Within Functional Limits LUE Assessment LUE Assessment: Exceptions to WFL LUE AROM (degrees) Overall AROM Left Upper Extremity: Deficits;Other (comment) (Some grip/exten digits, elbow w/ grav elim., little @ should) RLE Assessment RLE Assessment: Within Functional Limits LLE Assessment LLE Assessment: Exceptions to [facility] LLE Strength LLE Overall Strength Comments: Difficult to assess secondary to pt with focused attention.  Grossly PROM WFL, Grossly strength is 1-2/5 during functional tasks, though pt not following directions for formal assessment.    See FIM for current functional status Refer to Care Plan for Long Term Goals  Recommendations for  other services: None  Discharge Criteria: Patient will be discharged from PT if patient refuses treatment 3 consecutive times without medical reason, if treatment goals not met, if there is a change in medical status, if patient makes no progress towards goals or if patient is discharged from hospital.  The above assessment, treatment plan, treatment alternatives and goals were discussed and mutually agreed upon: by patient  Rosita Guzzetta, Alison Murray 11/15/2011, 12:11 PM

## 2011-11-15 NOTE — Evaluation (Addendum)
Occupational Therapy Assessment and Plan  Patient Details  Name: Anthony Hardin MRN: [id number] Date of Birth: 02-25-1951  OT Diagnosis: Acute hematoma R thalamus w/ L neglect vs Inattention, Diplopia, poor awareness of dificits Rehab Potential: Rehab Potential: Good ELOS: 3 weeks   Today's Date: 11/15/2011 Time: 07:32-08:32 Time Calculation (min): 60 min  Problem List:  Patient Active Problem List  Diagnosis  . Intracerebral hemorrhage  . Hypertension, malignant  . Hemiplegia, unspecified, affecting nondominant side  . Thalamic hemorrhage    Past Medical History:  Past Medical History  Diagnosis Date  . Hypertension    Past Surgical History: No past surgical history on file.  Assessment & Plan Clinical Impression: Patient is a 61 y.o. year old male with recent admission to the hospital on 10/31/2011 with diffuse weakness and unable to move and fell out of bed onto the floor. MRI of the brain showed a 4 x 1 x 2.5 cm acute hematoma right thalamus with mass effect and displacement distortion of the third ventricle but no suspicion of obstructive hydrocephalus. MRA of the head with no stenosis or occlusion. Close monitoring of blood pressure with intravenous Cardene that was slowly weaned and transition to by mouth medications. Neurosurgery Dr. Marikay Alar consulted and advise conservative care with followup serial CT scans. Latest cranial CT scan 11/04/2011 showed stable left hemorrhage involving right thalamus with stable surrounding vasogenic edema. Noted bouts of diplopia as well as left inattention. Patient transferred to CIR on 11/14/2011 .    Patient currently requires total-max assist with basic self-care skills secondary to abnormal tone, unbalanced muscle activation, motor apraxia and decreased motor planning, Diplopia, ?L neglect vs L Inattention, decreased initiation, decreased awareness, decreased problem solving and decreased safety awareness and decreased postural control  and hemiplegia.  Prior to hospitalization, patient could complete all ADL, selfcare/homemaking, work tasks at independent level.  Patient will benefit from skilled intervention to decrease level of assist with basic self-care skills prior to discharge home vs other TBD.  Anticipate patient will require 24 hour supervision and physical assistance and follow up home health.  OT - End of Session Endurance Deficit: Yes OT Assessment Rehab Potential: Good Barriers to Discharge: Decreased caregiver support Barriers to Discharge Comments: Lives alone OT Plan OT Frequency: 1-2 X/day, 60-90 minutes Estimated Length of Stay: 3 weeks OT Treatment/Interventions: Cognitive remediation/compensation;Balance/vestibular training;Discharge planning;DME/adaptive equipment instruction;Functional mobility training;Neuromuscular re-education;Patient/family education;Self Care/advanced ADL retraining;Therapeutic Activities;Therapeutic Exercise;UE/LE Strength taining/ROM;UE/LE Coordination activities;Visual/perceptual remediation/compensation OT Recommendation Follow Up Recommendations: Home health OT;24 hour supervision/assistance Equipment Recommended: 3 in 1 bedside comode;tub bench; Wheelchair (measurements);Wheelchair cushion (measurements)  OT Evaluation Precautions/Restrictions  Precautions Precautions: Fall Precaution Comments: pt with ?L neglect vs L Inattention.   Restrictions Weight Bearing Restrictions: No     Pain Pain Assessment Pain Assessment: No/denies pain Home Living/Prior Functioning Home Living Lives With: Alone Available Help at Discharge: Other (Comment) (Unclear per brother.  ) Type of Home: House Home Access: Stairs to enter [employer] of Steps: 2 Entrance Stairs-Rails: Right Home Layout: Two level;Able to live on main level with bedroom/bathroom Bathroom Shower/Tub: Engineer, manufacturing systems: Standard Home Adaptive Equipment: Hand-held shower  hose Additional Comments: Per brother pt lives alone and does not have any assist locally.  Brother and Sister live in [place] and per brother they are unable for him to stay with either of them.  Brother states that plan will have to be D/C to SNF or hire 24hr CGs if pt cannot D/C to home.   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 Prior Function Level of Independence: Independent with homemaking with ambulation Able to Take Stairs?: Yes Driving: Yes Vocation: Full time employment ADL ADL Eating: Maximal cueing;Minimal assistance Where Assessed-Eating: Chair Grooming: Maximal assistance Where Assessed-Grooming: Sitting at sink;Chair Upper Body Bathing: Dependent;Maximal assistance Where Assessed-Upper Body Bathing: Sitting at sink;Chair Lower Body Bathing: Dependent Where Assessed-Lower Body Bathing: Sitting at sink;Chair Upper Body Dressing: Other (Comment);Maximal assistance (Don/Doff gown, no clothes during eval/tx session) Where Assessed-Upper Body Dressing: Sitting at sink;Chair Lower Body Dressing: Maximal assistance Where Assessed-Lower Body Dressing: Chair Toileting: Other (Comment) (W/ Foley catheter, NT) Toilet Transfer: Not assessed Tub/Shower Transfer: Not assessed ADL Comments: Pt is overall Max-total assist selfcare, transfers and ADL's at this time. He has L neglect, Diplopia, difficulty with task initiation, sequencing, poor safety awareness & awareness of deficits. He will benefit from In-pt rehab to address deficits in ADL, selfcare, ?left neglect vs L Inattention, and safety  awareness. Vision/Perception  Vision - History Baseline Vision: Wears glasses all the time Patient Visual Report: Diplopia Vision - Assessment Eye Alignment: Impaired (comment) Vision Assessment: Vision impaired - to be further tested in functional context Ocular Range of Motion: Impaired-to be further tested in functional context Alignment/Gaze Preference: Gaze right Saccades: Impaired - to be further tested in functional context;Decreased speed of saccadic movement Convergence: Impaired - to be further tested in functional context  Cognition Overall Cognitive Status: Impaired Arousal/Alertness: Other (comment) (pt very fatigued and difficult to maintain arousal.  ) Orientation Level: Oriented X4 Attention: Focused Focused Attention: Appears intact Sustained Attention: Impaired Sustained Attention Impairment: Verbal basic;Functional basic Memory: Appears intact (long term and basic short term appears intact) Awareness: Impaired Awareness Impairment: Intellectual impairment;Emergent impairment (difficulty recognizing implications of deficits) Problem Solving: Impaired Problem Solving Impairment: Functional basic Executive Function: Initiating;Self Monitoring;Self Correcting;Sequencing;Organizing;Decision Making Sequencing: Impaired Sequencing Impairment: Verbal complex;Functional basic Organizing: Impaired Organizing Impairment: Verbal complex;Functional basic Decision Making: Impaired Decision Making Impairment: Functional basic;Verbal complex Initiating: Impaired Initiating Impairment: Functional basic Self Monitoring: Impaired Self Monitoring Impairment: Functional basic;Verbal complex Self Correcting: Impaired Self Correcting Impairment: Functional basic Behaviors: Impulsive;Perseveration Safety/Judgment: Impaired Comments: decreased safety awareness with basic awareness of deficits Sensation Sensation Additional Comments: Difficult to assess as pt with focused  attention and unable to stay on task.   Coordination Gross Motor Movements are Fluid and Coordinated: No Fine Motor Movements are Fluid and Coordinated: No Coordination and Movement Description: Pt is L HD w/ ?L neglect vs L inattention noted, has some grip/exten of digits, some elbow flex w/ gravity assist, little shoulder mvt Motor  Motor Motor: Hemiplegia;Motor apraxia Mobility  Bed Mobility Bed Mobility: Supine to Sit Rolling Right: 2: Max assist Rolling Right Details: Verbal cues for precautions/safety;Verbal cues for sequencing;Verbal cues for technique;Tactile cues for placement Supine to Sit: 3: Mod assist Sitting - Scoot to Edge of Bed: 4: Min assist Sitting - Scoot to [employer] of Bed Details: Verbal cues for sequencing;Tactile cues for placement;Tactile cues for initiation;Tactile cues for sequencing;Tactile cues for weight shifting;Other (comment) (used pad) Transfers Sit to Stand: 2: Max assist;With upper extremity assist;From chair/3-in-1;With armrests Sit to Stand Details (indicate cue type and reason): Max facilitation for blocking L knee, positioning L LE, trunk/hip extension, anterior wt shift over BOS, pt tends to  push L, but improved when having pt place his hand on PT Stand to Sit: 2: Max assist;With upper extremity assist;With armrests;To chair/3-in-1 Stand to Sit Details: Max cueing for use of armrest, controlling descent  Trunk/Postural Assessment    L lateral lean in sitting; pushes to left at times. Balance Static Standing Balance Static Standing - Balance Support: Right upper extremity supported Static Standing - Level of Assistance: 2: Max assist Static Standing - Comment/# of Minutes: pt able to stand for ~1-40mins for 5 trials.  pt requiring MaxA and facilitation for trunk/hip extension, anterior wt shift over BOS, attending to task, attending to L side.  L LE blocked to prevent buckling.   Extremity/Trunk Assessment RUE Assessment RUE Assessment: Within  Functional Limits LUE Assessment LUE Assessment: Exceptions to WFL LUE AROM (degrees) Overall AROM Left Upper Extremity: Deficits;Other (comment) (Some grip/exten digits, elbow w/ grav elim., little to no AROM @ shoulder)  See FIM for current functional status Refer to Care Plan for Long Term Goals  Recommendations for other services: None  Discharge Criteria: Patient will be discharged from OT if patient refuses treatment 3 consecutive times without medical reason, if treatment goals not met, if there is a change in medical status, if patient makes no progress towards goals or if patient is discharged from hospital.  The above assessment, treatment plan, treatment alternatives and goals were discussed and mutually agreed upon: by patient  Pt was seen today for 1:1 treatment session after evaluation with focus on ADL's (grooming, bathing), functional transfer from bed-chair and sit to stand & stand pivot transfer to chair w/ 1+. Pt was overall Max assist UB ADL's and dependent for LB ADL's this am. He required Max assist for grooming and hand over hand techniques as well as neuromuscular re-education LUE. He demonstrated decreased awareness of deficits and decreased ability to initiate tasks throughout session noted.  Roselie Awkward Dixon 11/15/2011, 12:16 PM

## 2011-11-15 NOTE — Care Management Note (Signed)
[facility] Individual Statement of Services  Patient Name:  Anthony Hardin  Date:  11/15/2011  Welcome to the [facility].  Our goal is to provide you with an individualized program based on your diagnosis and situation, designed to meet your specific needs.  With this comprehensive rehabilitation program, you will be expected to participate in at least 3 hours of rehabilitation therapies Monday-Friday, with modified therapy programming on the weekends.  Your rehabilitation program will include the following services:  Physical Therapy (PT), Occupational Therapy (OT), Speech Therapy (ST), 24 hour per day rehabilitation nursing, Therapeutic Recreaction (TR), Neuropsychology, Case Management (RN and Social Worker), Rehabilitation Medicine, Nutrition Services and Pharmacy Services  Weekly team conferences will be held on Wednesday to discuss your progress.  Your RN Case Designer, television/film set will talk with you frequently to get your input and to update you on team discussions.  Team conferences with you and your family in attendance may also be held.  Expected length of stay: 4 weeks  Overall anticipated outcome: min assist level  Depending on your progress and recovery, your program may change.  Your RN Case Estate agent will coordinate services and will keep you informed of any changes.  Your RN Sports coach and SW names and contact numbers are listed  below.  The following services may also be recommended but are not provided by the [facility]:   Driving Evaluations  Home Health Rehabiltiation Services  Outpatient Rehabilitatation [facility]  Vocational Rehabilitation   Arrangements will be made to provide these services after discharge if needed.  Arrangements include referral to agencies that provide these services.  Your insurance has been verified to be:  [employer] Your primary doctor is:  [facility]  Pertinent  information will be shared with your doctor and your insurance company.    Social Worker:  Dossie Der, [place] [phone]  Information discussed with and copy given to patient by: Lucy Chris, 11/15/2011, 8:23 AM

## 2011-11-15 NOTE — Progress Notes (Signed)
Patient information reviewed and entered into [facility] system by Christinia Lambeth, RN, CRRN, PPS Coordinator.  Information including medical coding and functional independence measure will be reviewed and updated through discharge.    

## 2011-11-15 NOTE — Evaluation (Signed)
Speech Language Pathology Assessment and Plan  Patient Details  Name: Anthony Hardin MRN: [id number] Date of Birth: [birth year]  SLP Diagnosis: Cognitive Impairments  Rehab Potential: Good ELOS: 4 weeks   Today's Date: 11/15/2011 Time: [phone] Time Calculation (min): 60 min  Problem List:  Patient Active Problem List  Diagnosis  . Intracerebral hemorrhage  . Hypertension, malignant  . Hemiplegia, unspecified, affecting nondominant side  . Thalamic hemorrhage   Past Medical History:  Past Medical History  Diagnosis Date  . Hypertension    Past Surgical History: No past surgical history on file.  Assessment / Plan / Recommendation Clinical Impression  KWAME RYLAND is a 61 y.o. left-handed male with history of hypertension. 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 Admitted 10/31/2011 with diffuse weakness and unable to move and fell out of bed onto the floor. MRI of the brain showed a 4 x 1 x 2.5 cm acute hematoma right thalamus with mass effect and displacement distortion of the third ventricle but no suspicion of obstructive hydrocephalus. MRA of the head with no stenosis or occlusion. Latest cranial CT scan 11/04/2011 showed stable left hemorrhage involving right thalamus with stable surrounding vasogenic edema. Noted bouts of diplopia as well as left inattention. Trial of Ritalin was added 11/11/2011 to enable patient to attend better to tasks. Follow up speech therapy recommended for questionable dysphagia to maintain on mechanical soft diet with thin liquids. Patient transferred to [facility] 11/14/11.  Oral motor exam revealed mild left sided sensory impairments that are complicated  by patient's left inattention. Patient also exhibits cognitive deficits in attention, problem solving, and safety awareness. Patient verbally acknowledged his physical deficits but had difficulty recognizing some of the implications of those deficits. Patient also required max to total  assist to initiate tasks and complete tasks in a timely manner. Patient continues to be a good candidate for inpatient rehabilitation services and would benefit from intensive speech therapy to maximize functional  independence and safety with basic ADLs  and reduce burden on care upon discharge.    SLP Assessment  Patient will need skilled Speech Lanaguage Pathology Services during CIR admission    Recommendations  Follow up Recommendations: 24 hour supervision/assistance;Other (comment) (TBD) Equipment Recommended: None recommended by SLP    SLP Frequency 1-2 X/day, 30-60 minutes;5 out of 7 days   SLP Treatment/Interventions Cognitive remediation/compensation;Cueing hierarchy;Environmental controls;Speech/Language facilitation;Oral motor exercises;Functional tasks;Patient/family education;Therapeutic Activities;Internal/external aids    Pain Pain Assessment Pain Assessment: No/denies pain Pain Score: 0-No pain Prior Functioning Type of Home: House Lives With: Alone Available Help at Discharge: Other (Comment) (Unclear per brother.  ) Vocation: Full time employment  Short Term Goals: Week 1: SLP Short Term Goal 1 (Week 1): Patient will sustain attention to a basic familiar task for 2 minutes with mod multimodal cues.   SLP Short Term Goal 2 (Week 1): Patient will demonstrate emergent awareness by asking for help when needed with max assist verbal cues.   SLP Short Term Goal 3 (Week 1): Patient will solve basic familiar problems with max assist multimodal cues.   SLP Short Term Goal 4 (Week 1): Patient will consume dys. 3 textures and thin liquids with min assist verbal cues with no overt s/s of aspiration.    See FIM for current functional status Refer to Care Plan for Long Term Goals  Recommendations for other services: None  Discharge Criteria: Patient will be discharged from SLP if patient refuses treatment 3 consecutive times without medical reason,  if treatment goals not met,  if there is a change in medical status, if patient makes no progress towards goals or if patient is discharged from hospital.  The above assessment, treatment plan, treatment alternatives and goals were discussed and mutually agreed upon: by patient and by family.  [name], Conrad Woodland  Graduate Clinician Speech Language Pathology   Page, [name] 11/15/2011, 1:30 PM  The above assessment and plan has been reviewed and SLP is in agreement. [name], M.A., CCC-SLP [phone]

## 2011-11-15 NOTE — Progress Notes (Addendum)
Recreational Therapy Session Note  Patient Details  Name: Anthony Hardin MRN: [id number] Date of Birth: [birth year] Today's Date: 11/15/2011 Time:  1300-1315 Pain: no c/o Skilled Therapeutic Interventions/Progress Updates: attempted eval completion, pt restless throughout eval and had difficulty maintaining topic.  Reviewed TR services and pt stated he needed to focus on doing necessary things right now.  Will continue to monitor through team for future participation.  Bret Stamour 11/15/2011, 5:32 PM

## 2011-11-15 NOTE — Progress Notes (Signed)
INITIAL ADULT NUTRITION ASSESSMENT Date: 11/15/2011   Time: 11:03 AM  Reason for Assessment: Health History  INTERVENTION: 1. Ensure Complete po BID, each supplement provides 350 kcal and 13 grams of protein. 2. MVI daily 3. RD to continue to follow nutrition care plan  DOCUMENTATION CODES Per approved criteria  -Not Applicable    ASSESSMENT: Male 61 y.o.  Dx: Thalamic hemorrhage  Hx:  Past Medical History  Diagnosis Date  . Hypertension    No past surgical history on file.  Related Meds:     . amLODipine  10 mg Oral Daily  . antiseptic oral rinse  15 mL Mouth Rinse BID  . cloNIDine  0.1 mg Transdermal Weekly  . enoxaparin (LOVENOX) injection  40 mg Subcutaneous Q24H  . hydrochlorothiazide  25 mg Oral Daily  . lisinopril  10 mg Oral QHS  . lisinopril  20 mg Oral Daily  . methylphenidate  5 mg Oral Q0600  . pantoprazole  20 mg Oral Q1200  . potassium chloride  10 mEq Oral Daily  . senna-docusate  1 tablet Oral BID  . DISCONTD: amLODipine  10 mg Oral Daily  . DISCONTD: cloNIDine  0.1 mg Transdermal Weekly  . DISCONTD: hydrochlorothiazide  25 mg Oral Daily  . DISCONTD: methylphenidate  5 mg Oral Daily   Ht: 5\' 9"  (175.3 cm)  Wt: 166 lb 10.7 oz (75.6 kg)  Ideal Wt: 72.7 kg % Ideal Wt: 104%  Wt Readings from Last 15 Encounters:  11/14/11 166 lb 10.7 oz (75.6 kg)  11/05/11 171 lb 11.8 oz (77.9 kg)  Usual Wt: 171 lb % Usual Wt: 97%  Body mass index is 24.61 kg/(m^2). Weight is WNL.  Food/Nutrition Related Hx: pt very active PTA with healthy eating habits per brother  Labs:  CMP     Component Value Date/Time   NA 135 11/15/2011 0625   K 3.9 11/15/2011 0625   CL 96 11/15/2011 0625   CO2 28 11/15/2011 0625   GLUCOSE 103* 11/15/2011 0625   BUN 30* 11/15/2011 0625   CREATININE 1.19 11/15/2011 0625   CALCIUM 10.4 11/15/2011 0625   PROT 7.0 11/15/2011 0625   ALBUMIN 3.5 11/15/2011 0625   AST 18 11/15/2011 0625   ALT 38 11/15/2011 0625   ALKPHOS 58 11/15/2011 0625   BILITOT 0.5  11/15/2011 0625   GFRNONAA 65* 11/15/2011 0625   GFRAA 76* 11/15/2011 0625    Intake/Output Summary (Last 24 hours) at 11/15/11 1113 Last data filed at 11/14/11 2222  Gross per 24 hour  Intake    240 ml  Output    200 ml  Net     40 ml   Diet Order: Dysphagia 3 with thin liquids  Supplements/Tube Feeding: none  IVF:    Estimated Nutritional Needs:   Kcal: 1900 - 2000 kcal Protein:  90 - 105 grams Fluid:  1.9 - 2 liters daily  Pt followed by RD during acute hospitalization for poor oral intake. Pt was given Ensure Complete and Ensure Pudding supplements to supplement poor intake. These supplements were discontinued and patient was given Magic Cup supplements.  PO intake remains highly variable, ranging from 25 - 90%.  Pt with 3% wt loss since initial acute hospitalization. Pt is at nutrition risk given ongoing wt loss and highly variable PO intake given decreased alertness.  Patient's brother provides nutrition history. Per brother, pt with adequate intake PTA. Pt plays tennis and is very active. Eats fairly healthy.  Patient agreeable to Ensure.  NUTRITION  DIAGNOSIS: -Inadequate oral intake (NI-2.1).  Status: Ongoing  RELATED TO: decreased alertness  AS EVIDENCE BY: poor meal completion (<25%).  MONITORING/EVALUATION(Goals): Goal: Pt to meet >/= 90% of their estimated nutrition needs Monitor: weight trends, lab trends, I/O's, PO intake, supplement tolerance  EDUCATION NEEDS: -No education needs identified at this time  Jarold Motto MS, RD, LDN Pager: [phone] After-hours pager: [phone]

## 2011-11-15 NOTE — Progress Notes (Signed)
Occupational Therapy Session Note  Patient Details  Name: Anthony Hardin MRN: [id number] Date of Birth: [birth year]  Today's Date: 11/15/2011 Time: 1000-1025 Time Calculation (min): 25 min  Short Term Goals: Week 1:  OT Short Term Goal 1 (Week 1): Pt will perform grooming activities seated at sink w/ Mod Assist OT Short Term Goal 1 - Progress (Week 1): Progressing toward goal OT Short Term Goal 2 (Week 1): Pt will perform UB bathing with Mod assist and  VC/TC's while seated at sink OT Short Term Goal 2 - Progress (Week 1): Progressing toward goal OT Short Term Goal 3 (Week 1): Pt will perform UB dressing/Hemi techniques seated with Mod Assist OT Short Term Goal 3 - Progress (Week 1): Progressing toward goal OT Short Term Goal 4 (Week 1): Pt will demonstrate task initiation for ADL/selfcare tasks on 1 out 3 occassions with verbal, visual or tactile cues OT Short Term Goal 4 - Progress (Week 1): Progressing toward goal  Skilled Therapeutic Interventions/Progress Updates:  Cognitive remediation/compensation;Balance/vestibular training;Discharge planning;DME/adaptive equipment instruction;Functional mobility training;Neuromuscular re-education;Patient/family education;Self Care/advanced ADL retraining;Therapeutic Activities;Therapeutic Exercise;UE/LE Strength taining/ROM;UE/LE Coordination activities;Visual/perceptual remediation/compensation   General: Pt seen for OT 1:1 session with focus on neuromuscular re-education, task initiation, weight shifting, attention to LUE & functional transfers during dressing UB & LB. Pt was Max A UB dressing and Max A for all task initiation noted. Pt very lethargic. Max assist LB don/doff shorts and total assist fasten shorts. +1 assist sit to stand w/ L LE blocked to prevent knee buckling and tactile cues to hip for weight shift to right as pt was noted to push to left.   Therapy Documentation Precautions:  Precautions Precautions: Fall Precaution  Comments: pt with ?L neglect vs L inattention.   Restrictions Weight Bearing Restrictions: No     Pain: Pain Assessment Pain Assessment: No/denies pain Pain Score: 0-No pain ADL: ADL Eating: Maximal cueing;Minimal assistance Where Assessed-Eating: Chair Grooming: Maximal assistance Where Assessed-Grooming: Sitting at sink;Chair Upper Body Bathing: Dependent;Maximal assistance Where Assessed-Upper Body Bathing: Sitting at sink;Chair Lower Body Bathing: Dependent Where Assessed-Lower Body Bathing: Sitting at sink;Chair Upper Body Dressing: Other (Comment);Maximal assistance (Don/Doff gown, no clothes during eval/tx session) Where Assessed-Upper Body Dressing: Sitting at sink;Chair Lower Body Dressing: Maximal assistance Where Assessed-Lower Body Dressing: Chair Toileting: Other (Comment) (W/ Foley catheter, NT) Toilet Transfer: Not assessed Tub/Shower Transfer: Not assessed ADL Comments: Pt is overall Max-total assist selfcare, transfers and ADL's at this time. He has  ?L neglect vs L Inattention, Diplopia, difficulty with task initiation, sequencing, poor safety awareness & awareness of deficits. He will benefit from In-pt rehab to address deficits in ADL, selfcare, left neglect, and safety awareness.     See FIM for current functional status  Therapy/Group: Individual Therapy  Charletta Cousin, Kareli Hossain Beth Dixon 11/15/2011, 1:16 PM

## 2011-11-15 NOTE — Progress Notes (Signed)
Patient ID: Anthony Hardin, male   DOB: 1950-05-17, 61 y.o.   MRN: [id number] Admitted 10/31/2011 with diffuse weakness and unable to move and fell out of bed onto the floor. MRI of the brain showed a 4 x 1 x 2.5 cm acute hematoma right thalamus with mass effect and displacement distortion of the third ventricle but no suspicion of obstructive hydrocephalus. MRA of the head with no stenosis or occlusion.   Subjective/Complaints: I am working on basic things  Review of Systems  Musculoskeletal: Negative for myalgias.  Neurological: Positive for sensory change and focal weakness.  All other systems reviewed and are negative.     Objective: Vital Signs: Blood pressure 106/71, pulse 65, temperature 98 F (36.7 C), temperature source Oral, resp. rate 20, height 5\' 9"  (1.753 m), weight 75.6 kg (166 lb 10.7 oz), SpO2 98.00%. No results found. No results found for this or any previous visit (from the past 72 hour(s)).   HEENT: normal Cardio: RRR Resp: CTA B/L GI: BS positive Extremity:  No Edema Skin:   Intact Neuro: Confused, Abnormal Sensory absent sensation L side, Abnormal Motor 1/5 finger flex and arm ext, 1/5 hip/knee extensory synergy, Abnormal FMC Reflexes 3+, Reflexes: 3+, Tone:  Hypertonia, Inattention and Other L homonymous hemianopsia Musc/Skel:  Normal   Assessment/Plan: 1. Functional deficits secondary to R thalamic bleed which require 3+ hours per day of interdisciplinary therapy in a comprehensive inpatient rehab setting. Physiatrist is providing close team supervision and 24 hour management of active medical problems listed below. Physiatrist and rehab team continue to assess barriers to discharge/monitor patient progress toward functional and medical goals. FIM:                   Comprehension Comprehension Mode: Auditory Comprehension: 3-Understands basic 50 - 74% of the time/requires cueing 25 - 50%  of the time  Expression Expression Mode:  Verbal Expression: 2-Expresses basic 25 - 49% of the time/requires cueing 50 - 75% of the time. Uses single words/gestures.  Social Interaction Social Interaction: 2-Interacts appropriately 25 - 49% of time - Needs frequent redirection.  Problem Solving Problem Solving: 2-Solves basic 25 - 49% of the time - needs direction more than half the time to initiate, plan or complete simple activities  Memory Memory: 3-Recognizes or recalls 50 - 74% of the time/requires cueing 25 - 49% of the time  2. Anticoagulation/DVT prophylaxis with non-Pharmaceutical:  Medical Problem List and Plan:  1. 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 DVT Prophylaxis/Anticoagulation: Subcutaneous Lovenox initiated 10/31/2011. Monitor platelet counts and any signs of bleeding  3. Mood: Continue Ritalin. Check sleep chart  4. Neuropsych: This patient is not capable of making decisions on his/her own behalf at this time.He has very poor awareness of deficits and grossly overestimates his physical and cognitive abilities  5. Hypertension. Norvasc 10 mg daily, clonidine patch 0.1 mg weekly, hydrochlorothiazide 25 mg daily, lisinopril 20 mg a.m. and 10 mg at bedtime. Monitor with increased mobility  6. Dysphagia. Mechanical soft diet thin liquids. Speech therapy followup. Monitor for any signs of aspiration. Need to watch for inappropriate drinking/swallowing technique due to his impulsivity.     LOS (Days) 1 A FACE TO FACE EVALUATION WAS PERFORMED  KIRSTEINS,ANDREW E 11/15/2011, 7:11 AM

## 2011-11-15 NOTE — Care Management Note (Signed)
    Page 1 of 1   11/15/2011     8:25:51 AM   CARE MANAGEMENT NOTE 11/15/2011  Patient:  Anthony Hardin, Anthony Hardin   Account Number:  [id number]  Date Initiated:  10/31/2011  Documentation initiated by:  [facility]  Subjective/Objective Assessment:   Admitted with CVA, hypertensive, cardene gtt.     Action/Plan:   PT/OT evals- recommending inpatent rehab vs SNF   Anticipated DC Date:  11/14/2011   Anticipated DC Plan:  IP REHAB FACILITY  In-house referral  Clinical Social Worker      DC Planning Services  CM consult      PAC Choice  IP REHAB   Choice offered to / List presented to:             Status of service:  Completed, signed off Medicare Important Message given?   (If response is "NO", the following Medicare IM given date fields will be blank) Date Medicare IM given:   Date Additional Medicare IM given:    Discharge Disposition:  IP REHAB FACILITY  Per UR Regulation:  Reviewed for med. necessity/level of care/duration of stay  If discussed at Long Length of Stay Meetings, dates discussed:   11/07/2011  11/09/2011    Comments:  0903/13 Discharged to inpatient rehab. Jacquelynn Cree RN, [facility]   11/07/11 Transferred from ICU on 11/06/11, CIR trying to obtain insurance auth, SNF placement in place as backup.Will continue to follow for d/c needs. Jacquelynn Cree RN, BSN, CCM

## 2011-11-16 ENCOUNTER — Inpatient Hospital Stay (HOSPITAL_COMMUNITY): Payer: BC Managed Care – PPO | Admitting: *Deleted

## 2011-11-16 ENCOUNTER — Inpatient Hospital Stay (HOSPITAL_COMMUNITY): Payer: BC Managed Care – PPO | Admitting: Speech Pathology

## 2011-11-16 ENCOUNTER — Encounter (HOSPITAL_COMMUNITY): Payer: BC Managed Care – PPO | Admitting: Occupational Therapy

## 2011-11-16 ENCOUNTER — Inpatient Hospital Stay (HOSPITAL_COMMUNITY): Payer: BC Managed Care – PPO | Admitting: Occupational Therapy

## 2011-11-16 DIAGNOSIS — I619 Nontraumatic intracerebral hemorrhage, unspecified: Secondary | ICD-10-CM

## 2011-11-16 DIAGNOSIS — Z5189 Encounter for other specified aftercare: Secondary | ICD-10-CM

## 2011-11-16 MED ORDER — METHYLPHENIDATE HCL 5 MG PO TABS
5.0000 mg | ORAL_TABLET | Freq: Two times a day (BID) | ORAL | Status: DC
  Administered 2011-11-16 – 2011-12-06 (×39): 5 mg via ORAL
  Filled 2011-11-16 (×41): qty 1

## 2011-11-16 NOTE — Progress Notes (Signed)
Speech Language Pathology Daily Session Note  Patient Details  Name: Anthony Hardin MRN: [id number] Date of Birth: 1950-08-22  Today's Date: 11/16/2011 Time: 1000-1050 Time Calculation (min): 50 min  Short Term Goals: Week 1: SLP Short Term Goal 1 (Week 1): Patient will sustain attention to a basic familiar task for 2 minutes with mod multimodal cues.   SLP Short Term Goal 2 (Week 1): Patient will demonstrate emergent awareness by asking for help when needed with max assist verbal cues.   SLP Short Term Goal 3 (Week 1): Patient will solve basic familiar problems with max assist multimodal cues.   SLP Short Term Goal 4 (Week 1): Patient will consume dys. 3 textures and thin liquids with min assist verbal cues with no overt s/s of aspiration.    Skilled Therapeutic Interventions: Treatment session focused on addressing cognitive goals during basic tasks.  SLP facilitated session with max assist multi-modal cues to sort items with sustained attention to task for 1 minute.  Of note, sustained attention decreased as session progressed and SLP suspects fatigue impacted performance.  SLP also facilitated session by sitting in left field and patient occasionally, spontaneously directed eye contact to SLP while speaking.  Overall, through patient required total assist to locate left field during tasks.     FIM:  Comprehension Comprehension Mode: Auditory Comprehension: 3-Understands basic 50 - 74% of the time/requires cueing 25 - 50%  of the time Expression Expression Mode: Verbal Expression: 2-Expresses basic 25 - 49% of the time/requires cueing 50 - 75% of the time. Uses single words/gestures. Social Interaction Social Interaction: 2-Interacts appropriately 25 - 49% of time - Needs frequent redirection. Problem Solving Problem Solving: 2-Solves basic 25 - 49% of the time - needs direction more than half the time to initiate, plan or complete simple activities Memory Memory: 3-Recognizes or  recalls 50 - 74% of the time/requires cueing 25 - 49% of the time FIM - Eating Eating Activity: 4: Helper occasionally scoops food on utensil;4: Helper occasionally brings food to mouth;4: Help with picking up utensils  Pain Pain Assessment Pain Assessment: No/denies pain  Therapy/Group: Individual Therapy  Charlane Ferretti., CCC-SLP [phone]  Shenee Wignall 11/16/2011, 1:48 PM

## 2011-11-16 NOTE — Progress Notes (Signed)
Patient ID: Anthony Hardin, male   DOB: October 30, 1950, 61 y.o.   MRN: [id number] Admitted 10/31/2011 with diffuse weakness and unable to move and fell out of bed onto the floor. MRI of the brain showed a 4 x 1 x 2.5 cm acute hematoma right thalamus with mass effect and displacement distortion of the third ventricle but no suspicion of obstructive hydrocephalus. MRA of the head with no stenosis or occlusion.   Subjective/Complaints: Very tired after therapy.  Spoke at length about rehab process  Review of Systems  Musculoskeletal: Negative for myalgias.  Neurological: Positive for sensory change and focal weakness.  All other systems reviewed and are negative.     Objective: Vital Signs: Blood pressure 111/70, pulse 79, temperature 97.9 F (36.6 C), temperature source Oral, resp. rate 17, height 5\' 9"  (1.753 m), weight 75.6 kg (166 lb 10.7 oz), SpO2 97.00%. No results found. Results for orders placed during the hospital encounter of 11/14/11 (from the past 72 hour(s))  CBC WITH DIFFERENTIAL     Status: Normal   Collection Time   11/15/11  6:25 AM      Component Value Range Comment   WBC 6.9  4.0 - 10.5 K/uL    RBC 4.96  4.22 - 5.81 MIL/uL    Hemoglobin 14.9  13.0 - 17.0 g/dL    HCT 40.9  81.1 - 91.4 %    MCV 87.9  78.0 - 100.0 fL    MCH 30.0  26.0 - 34.0 pg    MCHC 34.2  30.0 - 36.0 g/dL    RDW 78.2  95.6 - 21.3 %    Platelets 363  150 - 400 K/uL    Neutrophils Relative 54  43 - 77 %    Neutro Abs 3.8  1.7 - 7.7 K/uL    Lymphocytes Relative 33  12 - 46 %    Lymphs Abs 2.3  0.7 - 4.0 K/uL    Monocytes Relative 10  3 - 12 %    Monocytes Absolute 0.7  0.1 - 1.0 K/uL    Eosinophils Relative 3  0 - 5 %    Eosinophils Absolute 0.2  0.0 - 0.7 K/uL    Basophils Relative 1  0 - 1 %    Basophils Absolute 0.0  0.0 - 0.1 K/uL   COMPREHENSIVE METABOLIC PANEL     Status: Abnormal   Collection Time   11/15/11  6:25 AM      Component Value Range Comment   Sodium 135  135 - 145 mEq/L    Potassium 3.9  3.5 - 5.1 mEq/L    Chloride 96  96 - 112 mEq/L    CO2 28  19 - 32 mEq/L    Glucose, Bld 103 (*) 70 - 99 mg/dL    BUN 30 (*) 6 - 23 mg/dL    Creatinine, Ser 0.86  0.50 - 1.35 mg/dL    Calcium 57.8  8.4 - 10.5 mg/dL    Total Protein 7.0  6.0 - 8.3 g/dL    Albumin 3.5  3.5 - 5.2 g/dL    AST 18  0 - 37 U/L    ALT 38  0 - 53 U/L    Alkaline Phosphatase 58  39 - 117 U/L    Total Bilirubin 0.5  0.3 - 1.2 mg/dL    GFR calc non Af Amer 65 (*) >90 mL/min    GFR calc Af Amer 76 (*) >90 mL/min      HEENT:  normal Cardio: RRR Resp: CTA B/L GI: BS positive Extremity:  No Edema Skin:   Intact Neuro: Confused, Abnormal Sensory absent sensation L side, Abnormal Motor 1/5 finger flex and arm ext, 1/5 hip/knee extensory synergy, Abnormal FMC Reflexes 3+, Reflexes: 3+, Tone:  Hypertonia, Inattention and Other L homonymous hemianopsia Musc/Skel:  Normal   Assessment/Plan: 1. Functional deficits secondary to R thalamic bleed which require 3+ hours per day of interdisciplinary therapy in a comprehensive inpatient rehab setting. Physiatrist is providing close team supervision and 24 hour management of active medical problems listed below. Physiatrist and rehab team continue to assess barriers to discharge/monitor patient progress toward functional and medical goals. FIM: FIM - Bathing Bathing Steps Patient Completed: Chest;Left Arm;Abdomen;Front perineal area;Right upper leg;Left upper leg Bathing: 1: Total-Patient completes 0-2 of 10 parts or less than 25%  FIM - Upper Body Dressing/Undressing Upper body dressing/undressing steps patient completed: Thread/unthread right sleeve of pullover shirt/dresss;Thread/unthread left sleeve of pullover shirt/dress;Put head through opening of pull over shirt/dress;Pull shirt over trunk Upper body dressing/undressing: 2: Max-Patient completed 25-49% of tasks FIM - Lower Body Dressing/Undressing Lower body dressing/undressing steps patient completed:  Thread/unthread right pants leg;Thread/unthread left pants leg;Pull pants up/down;Fasten/unfasten pants Lower body dressing/undressing: 1: Total-Patient completed less than 25% of tasks     FIM - Archivist Transfers: 0-Activity did not occur  FIM - Banker Devices: Bed rails;HOB elevated Bed/Chair Transfer: 2: Bed > Chair or W/C: Max A (lift and lower assist);2: Chair or W/C > Bed: Max A (lift and lower assist)  FIM - Locomotion: Wheelchair Locomotion: Wheelchair: 0: Activity did not occur FIM - Locomotion: Ambulation Ambulation/Gait Assistance: Not tested (comment) Locomotion: Ambulation: 0: Activity did not occur  Comprehension Comprehension Mode: Auditory Comprehension: 4-Understands basic 75 - 89% of the time/requires cueing 10 - 24% of the time  Expression Expression Mode: Verbal Expression: 5-Expresses basic 90% of the time/requires cueing < 10% of the time.  Social Interaction Social Interaction: 5-Interacts appropriately 90% of the time - Needs monitoring or encouragement for participation or interaction.  Problem Solving Problem Solving: 3-Solves basic 50 - 74% of the time/requires cueing 25 - 49% of the time  Memory Memory: 3-Recognizes or recalls 50 - 74% of the time/requires cueing 25 - 49% of the time  2. Anticoagulation/DVT prophylaxis with non-Pharmaceutical:  Medical Problem List and Plan:  1. 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 DVT Prophylaxis/Anticoagulation: Subcutaneous Lovenox initiated 10/31/2011. Monitor platelet counts and any signs of bleeding  3. Mood: Increase Ritalin to BID. Check sleep chart  4. Neuropsych: This patient is not capable of making decisions on his/her own behalf at this time.He has very poor awareness of deficits and grossly overestimates his physical and cognitive abilities  5. Hypertension. Norvasc 10 mg daily,  clonidine patch 0.1 mg weekly, hydrochlorothiazide 25 mg daily, lisinopril 20 mg a.m. and 10 mg at bedtime. Monitor with increased mobility  6. Dysphagia. Mechanical soft diet thin liquids. Speech therapy followup. Monitor for any signs of aspiration. Need to watch for inappropriate drinking/swallowing technique due to his impulsivity.     LOS (Days) 2 A FACE TO FACE EVALUATION WAS PERFORMED  KIRSTEINS,ANDREW E 11/16/2011, 8:10 AM

## 2011-11-16 NOTE — Progress Notes (Signed)
Physical Therapy Session Note  Patient Details  Name: Anthony Hardin MRN: [id number] Date of Birth: [birth year]  Today's Date: 11/16/2011 Time: 1350-1435 Time Calculation (min): 45 min  Short Term Goals: Week 1:  PT Short Term Goal 1 (Week 1): pt will be able to SPT with ModA.   PT Short Term Goal 2 (Week 1): pt will perform bed mobility with rail with MinA.   PT Short Term Goal 3 (Week 1): pt will be able to stand during a functional task with MinA.   PT Short Term Goal 4 (Week 1): pt will ambulate with with Max A 20'.    Skilled Therapeutic Interventions/Progress Updates:  Tx focused on gait and stair training, NMR for normalizing postural alignment, and safety with functional transfer training.  Pt asleep upon arrival, brother present. Pt c/o headache, unable to rate. Nursing made aware and given pain meds.   Supine>sit via logrolling to R with Max A for LUE/LE management and trunk lifting. Pt with difficultly following single-step commands, even with R side.   Sit<>stand x4 throughout tx with Mod/max A for lifting and safely lowering with cues for scooting, hand placement, and timing. Therapist assist with safe L foot placement. Pt stood 105x60' with Max A due to inconsistent R, L, and posterior balance. Pt able to identify which direction out of balance 75% of the time, but only able to correct 50% of the time, especially when posterior. Pt with persistent R downward gaze with difficulty maintaining corrected upright vision >10seconds. Performed R/L weight-shifts x10 with A for L knee stability.   Gait training 1x8' with +2 bil HHA with assist needed to shift R, advance LLE and stabilize L knee in stance.   Stair training Up/down (backwards) 2 steps with R rail and +2 assist for weight shifting, foot placement for both R and L. Pt unable to follow commands during this activity, performing approximately 50% of task, attempting to sit prior to safely able to do so.   Pt unable to manage  WCc parts nor propulsion at this time due to comprehension, following instructions, and attention.   Educated brother on importance of maintaining interest on L side, sitting in room, etc. Pt very fatigued at end of tx, but brother reports increased lucidity today.      Therapy Documentation Precautions:  Precautions Precautions: Fall Precaution Comments: pt with L neglect.   Restrictions Weight Bearing Restrictions: No   Locomotion : Ambulation Ambulation/Gait Assistance: 1: +2 Total assist   See FIM for current functional status  Therapy/Group: Individual Therapy  Virl Cagey, PT 11/16/2011, 3:26 PM

## 2011-11-16 NOTE — Progress Notes (Signed)
At 0555, attempted to wake pt to give 0600 dose of ritalin 5mg . Unable to arouse pt enough to give med- with loud voice, bright lights, cold rag on neck, sternal rub, unable to get more than a flutter of the eyelids from pt. VS stable. Notified Dr. Wynn Banker- this is an ongoing issue resulting from pt's stroke. MD instructed RN to attempt to give ritalin with 0800 meds this morning once pt has had another chance to wake up. Continue to monitor. Mick Sell, RN

## 2011-11-16 NOTE — Progress Notes (Signed)
Social Work Assessment and Plan Social Work Assessment and Plan  Patient Details  Name: Anthony Hardin MRN: [id number] Date of Birth: [birth year]  Today's Date: 11/16/2011  Problem List:  Patient Active Problem List  Diagnosis  . Intracerebral hemorrhage  . Hypertension, malignant  . Hemiplegia, unspecified, affecting nondominant side  . Thalamic hemorrhage   Past Medical History:  Past Medical History  Diagnosis Date  . Hypertension    Past Surgical History: No past surgical history on file. Social History:  reports that he has never smoked. He has never used smokeless tobacco. He reports that he drinks alcohol. He reports that he does not use illicit drugs.  Family / Support Systems Marital Status: Single Patient Roles: Other (Comment) (Employee-University Professor, brother) Other Supports: Gary-brother  [phone]  Jaci Standard  [phone]   Parke Rublee-friend  [phone] Anticipated Caregiver: Unclear at this time-pursuing all options and awaiting assessments Ability/Limitations of Caregiver: No one can provide 24 hour care-looking at options Caregiver Availability: Other (Comment) (Awaitng progress to see which plan is best for pt) Family Dynamics: Pt is close with his siblings but both ate located in [place].  He has no local family, but numerous suppotive friends.  Social History Preferred language: English Religion:  Cultural Background: No issues Education: PhD Research Professor at [employer] Read: Yes Write: Yes Employment Status: Employed Name of Employer: Professor at [employer] Return to Work Plans: Unclear at this time Fish farm manager Issues: No issues Guardian/Conservator: Theatre stage manager is POA.  MD feels pt is not capable of making his own decisions at this time, therefore sister will be pt's decision maker.   Abuse/Neglect Physical Abuse: Denies Verbal Abuse: Denies Sexual Abuse: Denies Exploitation of patient/patient's  resources: Denies Self-Neglect: Denies  Emotional Status Pt's affect, behavior adn adjustment status: Pt is in and out of sleep during interview.  Most information recieved from pt's brother.  He reports pt is very active and independent.  Difficult to assess due to pt's arousal and will continue to assess. Recent Psychosocial Issues: Other medical issues Pyschiatric History: No history-deferred depression screen due to pt's level of alertness, will continue to monitor Substance Abuse History: No issues  Patient / Family Perceptions, Expectations & Goals Pt/Family understanding of illness & functional limitations: Brother has a good understanding of pt's condition and takes notes when talking with him.  He is communicating with his sister in [place] to keep her updated.  Pt talks about his awareness and lethargy but does off during his conversation. Premorbid pt/family roles/activities: Professor, Chief Financial Officer, Armed forces operational officer, friend, Brother, etc Anticipated changes in roles/activities/participation: Resume at discharge if possible Pt/family expectations/goals: Brother states: " We are hopeful but have been told it will take some time."  " We are trying to come up with a plan after this."  [employer]: None Premorbid Home Care/DME Agencies: None Transportation available at discharge: Family/ Friends Resource referrals recommended: Support group (specify) (CVA Support Group)  Discharge Planning Living Arrangements: Alone Support Systems: Other relatives;Friends/neighbors Type of Residence: Private residence Insurance Resources: Media planner (specify) Chief Executive Officer) Financial Resources: Employment Financial Screen Referred: No Living Expenses: Own Money Management: Patient Do you have any problems obtaining your medications?: No Home Management: Patient Patient/Family Preliminary Plans: Unclear at this time.  Siblings coming up with a plan.  Aware of the options  ie, hired assist, NHP, etc Social Work Anticipated Follow Up Needs: Other (comment) (May require NHP upon discharge)  Clinical Impression Pleasant family, brother is very involved and wants  all information possible to formulate a decision regarding discharge plan.  Somewhat intense and wants answers where there may not be any at this time. Continue to monitor pt and his coping, unable to include pt much due to his arousal issues from his CVA.  Lucy Chris 11/16/2011, 10:36 AM

## 2011-11-16 NOTE — Progress Notes (Signed)
Occupational Therapy Session Note  Patient Details  Name: Anthony Hardin MRN: [id number] Date of Birth: October 21, 1950  Today's Date: 11/16/2011 Time: 0900-0956 Time Calculation (min): 56 min  Short Term Goals: Week 1:  OT Short Term Goal 1 (Week 1): Pt will perform grooming activities seated at sink w/ Mod Assist OT Short Term Goal 1 - Progress (Week 1): Progressing toward goal OT Short Term Goal 2 (Week 1): Pt will perform UB bathing with Mod assist and  VC/TC's while seated at sink OT Short Term Goal 2 - Progress (Week 1): Progressing toward goal OT Short Term Goal 3 (Week 1): Pt will perform UB dressing/Hemi techniques seated with Mod Assist OT Short Term Goal 3 - Progress (Week 1): Progressing toward goal OT Short Term Goal 4 (Week 1): Pt will demonstrate task initiation for ADL/selfcare tasks on 1 out 3 occassions with verbal, visual or tactile cues OT Short Term Goal 4 - Progress (Week 1): Progressing toward goal  Skilled Therapeutic Interventions/Progress Updates:     Treatment Session #1) 09:00-09:56 am Pt seen this am with w/ skilled therapeutic focus on ADL retraining as well as neuromuscular re-education. Pt required hand over hand technique for LUE selfcare and was noted to have difficulty completing tasks with RUE as well ?due to decreased attention, increased distractibility, fatigue/lethergy. Pt is Max/total A LB ADL's and Max A UB ADL's at this time. Pt's brother present throughout session and asking questions [id number] rehabilitation frequently. Encouraged pt to focus on L and approached pt from L throughout session.  Treatment Session #2) 11:01-11:45am Pt seen for individual therapy session w/ focus on neuromuscular re-education LUE, weight shifting/WB through LUE while sitting on mat in rehab gym. Gentle A/AROM & PROM LUE. Pt required frequent redirection to task in minimally distracting environment. He benefits from VC's and TC's for postural control and was noted to be  impulsive at times (ie. attempting to lay down on mat w/o verbal warning due to fatigue) as he is unaware of deficits.  Therapy Documentation Precautions:  Precautions Precautions: Fall Precaution Comments: pt with L neglect.   Restrictions Weight Bearing Restrictions: No     Pain: Pt w/o c/o pain, however c/o being "tired"       See FIM for current functional status  Therapy/Group: Individual Therapy  [name] 11/16/2011, 12:53 PM

## 2011-11-17 ENCOUNTER — Inpatient Hospital Stay (HOSPITAL_COMMUNITY): Payer: BC Managed Care – PPO | Admitting: Speech Pathology

## 2011-11-17 ENCOUNTER — Inpatient Hospital Stay (HOSPITAL_COMMUNITY): Payer: BC Managed Care – PPO | Admitting: Occupational Therapy

## 2011-11-17 ENCOUNTER — Inpatient Hospital Stay (HOSPITAL_COMMUNITY): Payer: BC Managed Care – PPO | Admitting: *Deleted

## 2011-11-17 ENCOUNTER — Inpatient Hospital Stay (HOSPITAL_COMMUNITY): Payer: BC Managed Care – PPO | Admitting: Physical Therapy

## 2011-11-17 MED ORDER — BOOST / RESOURCE BREEZE PO LIQD
1.0000 | Freq: Three times a day (TID) | ORAL | Status: DC
  Administered 2011-11-17 – 2011-11-22 (×12): 1 via ORAL

## 2011-11-17 NOTE — Progress Notes (Signed)
Physical Therapy Session Note  Patient Details  Name: Anthony Hardin MRN: [id number] Date of Birth: 06/19/1950  Today's Date: 11/17/2011 Time: 1005-1035 Time Calculation (min): 30 min  Short Term Goals: Week 1:  PT Short Term Goal 1 (Week 1): pt will be able to SPT with ModA.   PT Short Term Goal 2 (Week 1): pt will perform bed mobility with rail with MinA.   PT Short Term Goal 3 (Week 1): pt will be able to stand during a functional task with MinA.   PT Short Term Goal 4 (Week 1): pt will ambulate with with Max A 20'.    Skilled Therapeutic Interventions/Progress Updates:  Pt positioned in L side of recliner, unable to figure out how to get LEs on the ground. Helped pt problem solve through getting set to edge of chair. Pt able to demonstrate small movements in LUE and LE when cued to look at it and move it, ie with LAQ for foot plate positioning .  Stand-pivot transfer to R with Min/mod A and step-by-step cues.   Sit<>stand to table with Min/Mod A and cues for scooting and hand placement. Bil UE supported on table and A to stabilize L knee. Pt stood 1x71min with no rest break. Pt unable to separate feet, needing physical assist to do so, then moving right back together. Attempted grabbing horseshoe with R UE and place them on high basketball ring on L, but pt needing hand-over-hand assist to grab and reach, and let go once on target. Difficult with sustained attention and new tasks.   Sit<>stand with Mod A and no table (less distracting) with pt holding on therapist forearms. Static stance 1x53min with family member holding colored object in L field. Pt cued to turn to look, identify color, and name something else of same color. Pt able to eventually see target and name color, but unable to come up with object of same color. Pt with posterior and R lean in stance with decreased weight-bearing on L.   Pt with improved ability to follow single-step commands during functional/automatic tasks,  but not during new or complex tasks, such as grabbing/reaching and moving objects across midline.      Therapy Documentation Precautions:  Precautions Precautions: Fall Precaution Comments: pt with L neglect.   Restrictions Weight Bearing Restrictions: No     See FIM for current functional status  Therapy/Group: Individual Therapy  Virl Cagey, PT 11/17/2011, 11:08 AM

## 2011-11-17 NOTE — Progress Notes (Signed)
Occupational Therapy Session Note  Patient Details  Name: Anthony Hardin MRN: [id number] Date of Birth: 1950-09-01  Today's Date: 11/17/2011 Time: 1100-1205 Time Calculation (min): 65 min  Short Term Goals: Week 1:  OT Short Term Goal 1 (Week 1): Pt will perform grooming activities seated at sink w/ Mod Assist OT Short Term Goal 1 - Progress (Week 1): Progressing toward goal OT Short Term Goal 2 (Week 1): Pt will perform UB bathing with Mod assist and  VC/TC's while seated at sink OT Short Term Goal 2 - Progress (Week 1): Progressing toward goal OT Short Term Goal 3 (Week 1): Pt will perform UB dressing/Hemi techniques seated with Mod Assist OT Short Term Goal 3 - Progress (Week 1): Progressing toward goal OT Short Term Goal 4 (Week 1): Pt will demonstrate task initiation for ADL/selfcare tasks on 1 out 3 occassions with verbal, visual or tactile cues OT Short Term Goal 4 - Progress (Week 1): Progressing toward goal  Skilled Therapeutic Interventions/Progress Updates:    Pt seen for ADL retraining at sink side with focus on initiation, attention to task, and attention to LUE.  Pt internally distracted throughout session and would easily get off topic, requiring max cues to redirect to task at hand.  Pt reports wanting to wash his hands and shave.  Completed bathing with max cues and max physical assist due to decreased attention and internal distraction.  Pt with minimal movements in first 3 digits on Lt hand with hand washing.  Pt mod assist sit to stand for LB dressing, requiring increased verbal cues and wait time to initiate.  Hand over hand with aspects of bathing due to decreased attention and initiation. Brother present for 50% of session, requesting to engage in hand writing activity with non-dominant Rt hand.  Pt required cues for initiation and placement on paper to write on line secondary to ?diplopia.  Therapy Documentation Precautions:  Precautions Precautions: Fall Precaution  Comments: pt with L neglect.   Restrictions Weight Bearing Restrictions: No Pain:  Pt with no c/o pain this session.  See FIM for current functional status  Therapy/Group: Individual Therapy  Leonette Monarch 11/17/2011, 12:19 PM

## 2011-11-17 NOTE — Progress Notes (Signed)
Social Work Patient ID: Anthony Hardin, male   DOB: 03/03/1951, 61 y.o.   MRN: [id number] Met with brother-Gary and sister-Jenny to discuss rehab plan and options.  Sister here for the weekend and aware of the plan and team conference. Both want to plan ahead and be researching options in the [place] for pt if plan is for him to go up there.  They are setting up a visiting list of pt's friends Both feel it is important to his mental health.  Continue to work toward a discharge plan.

## 2011-11-17 NOTE — Progress Notes (Signed)
Speech Language Pathology Daily Session Note  Patient Details  Name: Anthony Hardin MRN: [id number] Date of Birth: 12-07-1950  Today's Date: 11/17/2011 Time: 0830-0930 Time Calculation (min): 60 min  Short Term Goals: Week 1: SLP Short Term Goal 1 (Week 1): Patient will sustain attention to a basic familiar task for 2 minutes with mod multimodal cues.   SLP Short Term Goal 2 (Week 1): Patient will demonstrate emergent awareness by asking for help when needed with max assist verbal cues.   SLP Short Term Goal 3 (Week 1): Patient will solve basic familiar problems with max assist multimodal cues.   SLP Short Term Goal 4 (Week 1): Patient will consume dys. 3 textures and thin liquids with min assist verbal cues with no overt s/s of aspiration.    Skilled Therapeutic Interventions: Treatment session focused on addressing cognitive goals during a basic, familiar task.  Patient sustained attention to self-feeding task for 1 minute increments with verbal and tactile cue to initiate task then patient was able to repeat scooping and bring to mouth for 3-4 bites with max assist cues to pace, monitor bolus in left check and manage anterior loss of bolus.  Patient required max assist to witch between tasks/items during self-feeding.  Of note, patient displayed no overt s/s of aspiration throughout Dys.3 texture and thin liquid meal.    FIM:  Comprehension Comprehension Mode: Auditory Comprehension: 3-Understands basic 50 - 74% of the time/requires cueing 25 - 50%  of the time Expression Expression Mode: Verbal Expression: 2-Expresses basic 25 - 49% of the time/requires cueing 50 - 75% of the time. Uses single words/gestures. Social Interaction Social Interaction: 2-Interacts appropriately 25 - 49% of time - Needs frequent redirection. Problem Solving Problem Solving: 2-Solves basic 25 - 49% of the time - needs direction more than half the time to initiate, plan or complete simple  activities Memory Memory: 3-Recognizes or recalls 50 - 74% of the time/requires cueing 25 - 49% of the time FIM - Eating Eating Activity: 4: Helper occasionally scoops food on utensil;4: Helper occasionally brings food to mouth;4: Help with picking up utensils  Pain Pain Assessment Pain Assessment: No/denies pain  Therapy/Group: Individual Therapy  Charlane Ferretti., CCC-SLP [phone]  Anthony Hardin 11/17/2011, 1:15 PM

## 2011-11-17 NOTE — Progress Notes (Signed)
Nutrition Follow-up  Intervention:   1. Discontinue Ensure Complete PO BID 2. Add Resource Breeze po TID, each supplement provides 250 kcal and 9 grams of protein. 3. Offer appropriate alternatives it pt refuses meals 4. RD to continue to follow nutrition care plan  Assessment:   Pt is consuming 25% of meals. RN reports that pt is refusing Ensure Complete. Pt tried a Nurse, adult and would prefer to have that instead.  Diet Order:  Dysphagia 3 with thins Supplement: Ensure Complete PO BID  Meds: Scheduled Meds:   . amLODipine  10 mg Oral Daily  . antiseptic oral rinse  15 mL Mouth Rinse BID  . cloNIDine  0.1 mg Transdermal Weekly  . enoxaparin (LOVENOX) injection  40 mg Subcutaneous Q24H  . feeding supplement  1 Container Oral TID BM  . hydrochlorothiazide  25 mg Oral Daily  . lisinopril  10 mg Oral QHS  . lisinopril  20 mg Oral Daily  . methylphenidate  5 mg Oral BID WC  . multivitamin with minerals  1 tablet Oral Daily  . pantoprazole  20 mg Oral Q1200  . potassium chloride  10 mEq Oral Daily  . senna-docusate  1 tablet Oral BID  . DISCONTD: feeding supplement  237 mL Oral BID BM   Continuous Infusions:  PRN Meds:.acetaminophen, ondansetron (ZOFRAN) IV, ondansetron, polyethylene glycol, sorbitol  Labs:  CMP     Component Value Date/Time   NA 135 11/15/2011 0625   K 3.9 11/15/2011 0625   CL 96 11/15/2011 0625   CO2 28 11/15/2011 0625   GLUCOSE 103* 11/15/2011 0625   BUN 30* 11/15/2011 0625   CREATININE 1.19 11/15/2011 0625   CALCIUM 10.4 11/15/2011 0625   PROT 7.0 11/15/2011 0625   ALBUMIN 3.5 11/15/2011 0625   AST 18 11/15/2011 0625   ALT 38 11/15/2011 0625   ALKPHOS 58 11/15/2011 0625   BILITOT 0.5 11/15/2011 0625   GFRNONAA 65* 11/15/2011 0625   GFRAA 76* 11/15/2011 0625     Intake/Output Summary (Last 24 hours) at 11/17/11 1222 Last data filed at 11/16/11 1230  Gross per 24 hour  Intake    120 ml  Output      0 ml  Net    120 ml  BM 9/5  Weight Status:  166 lb - no new  wt  Re-estimated needs:  1900 - 2000 kcal, 90 - 105 grams protein  Nutrition Dx:  Inadequate oral intake r/t decreased alertness AEB poor meal completion (<25%.)  Goal: Pt to meet >/= 90% of their estimated nutrition needs; not met  Monitor: weight trends, lab trends, I/O's, PO intake, supplement tolerance  Jarold Motto MS, RD, LDN Pager: [phone] After-hours pager: [phone]

## 2011-11-17 NOTE — Progress Notes (Signed)
Patient ID: Anthony Hardin, male   DOB: 1950-03-19, 61 y.o.   MRN: [id number] Admitted 10/31/2011 with diffuse weakness and unable to move and fell out of bed onto the floor. MRI of the brain showed a 4 x 1 x 2.5 cm acute hematoma right thalamus with mass effect and displacement distortion of the third ventricle but no suspicion of obstructive hydrocephalus. MRA of the head with no stenosis or occlusion.   Subjective/Complaints: I can't get awake this am. Oriented to rehab but thinks it is beginning of week  Review of Systems  Musculoskeletal: Negative for myalgias.  Neurological: Positive for sensory change and focal weakness.  All other systems reviewed and are negative.     Objective: Vital Signs: Blood pressure 141/82, pulse 94, temperature 98.2 F (36.8 C), temperature source Oral, resp. rate 19, height 5\' 9"  (1.753 m), weight 75.6 kg (166 lb 10.7 oz), SpO2 99.00%. No results found. Results for orders placed during the hospital encounter of 11/14/11 (from the past 72 hour(s))  CBC WITH DIFFERENTIAL     Status: Normal   Collection Time   11/15/11  6:25 AM      Component Value Range Comment   WBC 6.9  4.0 - 10.5 K/uL    RBC 4.96  4.22 - 5.81 MIL/uL    Hemoglobin 14.9  13.0 - 17.0 g/dL    HCT 40.9  81.1 - 91.4 %    MCV 87.9  78.0 - 100.0 fL    MCH 30.0  26.0 - 34.0 pg    MCHC 34.2  30.0 - 36.0 g/dL    RDW 78.2  95.6 - 21.3 %    Platelets 363  150 - 400 K/uL    Neutrophils Relative 54  43 - 77 %    Neutro Abs 3.8  1.7 - 7.7 K/uL    Lymphocytes Relative 33  12 - 46 %    Lymphs Abs 2.3  0.7 - 4.0 K/uL    Monocytes Relative 10  3 - 12 %    Monocytes Absolute 0.7  0.1 - 1.0 K/uL    Eosinophils Relative 3  0 - 5 %    Eosinophils Absolute 0.2  0.0 - 0.7 K/uL    Basophils Relative 1  0 - 1 %    Basophils Absolute 0.0  0.0 - 0.1 K/uL   COMPREHENSIVE METABOLIC PANEL     Status: Abnormal   Collection Time   11/15/11  6:25 AM      Component Value Range Comment   Sodium 135  135 -  145 mEq/L    Potassium 3.9  3.5 - 5.1 mEq/L    Chloride 96  96 - 112 mEq/L    CO2 28  19 - 32 mEq/L    Glucose, Bld 103 (*) 70 - 99 mg/dL    BUN 30 (*) 6 - 23 mg/dL    Creatinine, Ser 0.86  0.50 - 1.35 mg/dL    Calcium 57.8  8.4 - 10.5 mg/dL    Total Protein 7.0  6.0 - 8.3 g/dL    Albumin 3.5  3.5 - 5.2 g/dL    AST 18  0 - 37 U/L    ALT 38  0 - 53 U/L    Alkaline Phosphatase 58  39 - 117 U/L    Total Bilirubin 0.5  0.3 - 1.2 mg/dL    GFR calc non Af Amer 65 (*) >90 mL/min    GFR calc Af Amer 76 (*) >90 mL/min  HEENT: normal Cardio: RRR Resp: CTA B/L GI: BS positive Extremity:  No Edema Skin:   Intact Neuro: Confused, Abnormal Sensory absent sensation L side, Abnormal Motor 1/5 finger flex and arm ext, 1/5 hip/knee extensory synergy, Abnormal FMC Reflexes 3+, Reflexes: 3+, Tone:  Hypertonia, Inattention and Other L homonymous hemianopsia Musc/Skel:  Normal   Assessment/Plan: 1. Functional deficits secondary to R thalamic bleed which require 3+ hours per day of interdisciplinary therapy in a comprehensive inpatient rehab setting. Physiatrist is providing close team supervision and 24 hour management of active medical problems listed below. Physiatrist and rehab team continue to assess barriers to discharge/monitor patient progress toward functional and medical goals. FIM: FIM - Bathing Bathing Steps Patient Completed: Chest;Left Arm;Abdomen;Front perineal area;Right upper leg;Left upper leg Bathing: 1: Total-Patient completes 0-2 of 10 parts or less than 25%  FIM - Upper Body Dressing/Undressing Upper body dressing/undressing steps patient completed: Thread/unthread right sleeve of pullover shirt/dresss;Thread/unthread left sleeve of pullover shirt/dress;Put head through opening of pull over shirt/dress;Pull shirt over trunk Upper body dressing/undressing: 2: Max-Patient completed 25-49% of tasks FIM - Lower Body Dressing/Undressing Lower body dressing/undressing steps  patient completed: Thread/unthread right underwear leg;Thread/unthread left underwear leg;Pull underwear up/down;Thread/unthread right pants leg;Thread/unthread left pants leg;Pull pants up/down;Don/Doff right sock;Don/Doff left sock;Fasten/unfasten pants Lower body dressing/undressing: 2: Max-Patient completed 25-49% of tasks     FIM - Archivist Transfers:  (Pt declined toilet transfer "I'm not focusing on that right )  FIM - Bed/Chair Transfer Bed/Chair Transfer Assistive Devices: Bed rails;Arm rests Bed/Chair Transfer: 2: Supine > Sit: Max A (lifting assist/Pt. 25-49%);3: Bed > Chair or W/C: Mod A (lift or lower assist);2: Chair or W/C > Bed: Max A (lift and lower assist)  FIM - Locomotion: Wheelchair Locomotion: Wheelchair: 1: Total Assistance/staff pushes wheelchair (Pt<25%) FIM - Locomotion: Ambulation Locomotion: Ambulation Assistive Devices: Other (comment) (HHA) Ambulation/Gait Assistance: 1: +2 Total assist Locomotion: Ambulation: 1: Two helpers  Comprehension Comprehension Mode: Auditory Comprehension: 3-Understands basic 50 - 74% of the time/requires cueing 25 - 50%  of the time  Expression Expression Mode: Verbal Expression: 2-Expresses basic 25 - 49% of the time/requires cueing 50 - 75% of the time. Uses single words/gestures.  Social Interaction Social Interaction: 2-Interacts appropriately 25 - 49% of time - Needs frequent redirection.  Problem Solving Problem Solving: 2-Solves basic 25 - 49% of the time - needs direction more than half the time to initiate, plan or complete simple activities  Memory Memory: 3-Recognizes or recalls 50 - 74% of the time/requires cueing 25 - 49% of the time  2. Anticoagulation/DVT prophylaxis with non-Pharmaceutical:  Medical Problem List and Plan:  1. 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 DVT Prophylaxis/Anticoagulation: Subcutaneous Lovenox initiated  10/31/2011. Monitor platelet counts and any signs of bleeding  3. Mood: Continue Ritalin, now is on bid. Check sleep chart  4. Neuropsych: This patient is not capable of making decisions on his/her own behalf at this time.He has very poor awareness of deficits and grossly overestimates his physical and cognitive abilities  5. Hypertension. Norvasc 10 mg daily, clonidine patch 0.1 mg weekly, hydrochlorothiazide 25 mg daily, lisinopril 20 mg a.m. and 10 mg at bedtime. Monitor with increased mobility  6. Dysphagia. Mechanical soft diet thin liquids. Speech therapy followup. Monitor for any signs of aspiration. Need to watch for inappropriate drinking/swallowing technique due to his impulsivity.     LOS (Days) 3 A FACE TO FACE EVALUATION WAS PERFORMED  Claudette Laws E 11/17/2011,  7:59 AM

## 2011-11-17 NOTE — Progress Notes (Signed)
Physical Therapy Session Note  Patient Details  Name: Anthony Hardin MRN: [id number] Date of Birth: Feb 16, 1951  Today's Date: 11/17/2011 Time: 4098-1191 Time Calculation (min): 50 min  Short Term Goals: Week 1:  PT Short Term Goal 1 (Week 1): pt will be able to SPT with ModA.   PT Short Term Goal 2 (Week 1): pt will perform bed mobility with rail with MinA.   PT Short Term Goal 3 (Week 1): pt will be able to stand during a functional task with MinA.   PT Short Term Goal 4 (Week 1): pt will ambulate with with Max A 20'.    Skilled Therapeutic Interventions/Progress Updates:   Patient fatigued this pm, asleep in bed but easy to arouse.  Patient requesting to empty his "bag"; confirmed that patient needed to use restroom.  Performed bed mobility to sit EOB with max A and total tactile and verbal cues to initiate transfer.  Sitting EOB patient donned R shoe on floor with extra time and cues but did not initiate donning L shoe.  Performed toileting in bathroom with grab bar and total A for clothing management and hygiene secondary to patient not wanting to release grab bar.  Supervision sitting balance while on toilet to have BM.  Transfers w/c <> mat with squat pivot to L and stand pivot to R with max A, multiple verbal and visual cues to attend to mat on L side, for initiation and sequencing.  Sitting on mat performed sitting balance, postural control training, attention to L, attention to task and initiation training during beach ball tennis with RUE holding racket beginning with hitting ball in midline >> L side and then placing tennis ball on target with RUE >> LUE beginning in midline > L side with mod A for balance, selective and sustained attention and attention to L side.  Often had to quiet patient's conversation with family in order to maintain attention to task.    Gait training x 15' x 2 with bilat HHA from bed <> toilet with assistance for LLE advancement and placement and assistance for  lateral weight shifting to cue step with RLE for forward stepping and pivoting, no buckling or genu recurvatum noted on LLE during stance.  Patient with improved visual scanning to L side today.   Therapy Documentation Precautions:  Precautions Precautions: Fall Precaution Comments: pt with L neglect.   Restrictions Weight Bearing Restrictions: No Vital Signs: Therapy Vitals Temp: 97.5 F (36.4 C) Temp src: Oral Pulse Rate: 69  Resp: 18  BP: 109/71 mmHg Patient Position, if appropriate: Sitting Oxygen Therapy SpO2: 100 % O2 Device: None (Room air) Pain: Pain Assessment Pain Assessment: No/denies pain  See FIM for current functional status  Therapy/Group: Individual Therapy  [name] 11/17/2011, 4:49 PM

## 2011-11-18 ENCOUNTER — Inpatient Hospital Stay (HOSPITAL_COMMUNITY): Payer: BC Managed Care – PPO | Admitting: Physical Therapy

## 2011-11-18 ENCOUNTER — Inpatient Hospital Stay (HOSPITAL_COMMUNITY): Payer: BC Managed Care – PPO | Admitting: Occupational Therapy

## 2011-11-18 ENCOUNTER — Inpatient Hospital Stay (HOSPITAL_COMMUNITY): Payer: BC Managed Care – PPO | Admitting: Speech Pathology

## 2011-11-18 DIAGNOSIS — I619 Nontraumatic intracerebral hemorrhage, unspecified: Secondary | ICD-10-CM

## 2011-11-18 DIAGNOSIS — Z5189 Encounter for other specified aftercare: Secondary | ICD-10-CM

## 2011-11-18 NOTE — Progress Notes (Signed)
Patient ID: Anthony Hardin, male   DOB: 05-19-1950, 61 y.o.   MRN: [id number] Admitted 10/31/2011 with diffuse weakness and unable to move and fell out of bed onto the floor. MRI of the brain showed a 4 x 1 x 2.5 cm acute hematoma right thalamus with mass effect and displacement distortion of the third ventricle but no suspicion of obstructive hydrocephalus. MRA of the head with no stenosis or occlusion.   Subjective/Complaints: "I have a hard time resetting my alarm" Oriented to rehab but thinks it is beginning of week  Review of Systems  Musculoskeletal: Negative for myalgias.  Neurological: Positive for sensory change and focal weakness.  All other systems reviewed and are negative.     Objective: Vital Signs: Blood pressure 137/79, pulse 69, temperature 97.5 F (36.4 C), temperature source Oral, resp. rate 18, height 5\' 9"  (1.753 m), weight 75.6 kg (166 lb 10.7 oz), SpO2 100.00%. No results found. Results for orders placed during the hospital encounter of 11/14/11 (from the past 72 hour(s))  CBC WITH DIFFERENTIAL     Status: Normal   Collection Time   11/15/11  6:25 AM      Component Value Range Comment   WBC 6.9  4.0 - 10.5 K/uL    RBC 4.96  4.22 - 5.81 MIL/uL    Hemoglobin 14.9  13.0 - 17.0 g/dL    HCT 32.4  40.1 - 02.7 %    MCV 87.9  78.0 - 100.0 fL    MCH 30.0  26.0 - 34.0 pg    MCHC 34.2  30.0 - 36.0 g/dL    RDW 25.3  66.4 - 40.3 %    Platelets 363  150 - 400 K/uL    Neutrophils Relative 54  43 - 77 %    Neutro Abs 3.8  1.7 - 7.7 K/uL    Lymphocytes Relative 33  12 - 46 %    Lymphs Abs 2.3  0.7 - 4.0 K/uL    Monocytes Relative 10  3 - 12 %    Monocytes Absolute 0.7  0.1 - 1.0 K/uL    Eosinophils Relative 3  0 - 5 %    Eosinophils Absolute 0.2  0.0 - 0.7 K/uL    Basophils Relative 1  0 - 1 %    Basophils Absolute 0.0  0.0 - 0.1 K/uL   COMPREHENSIVE METABOLIC PANEL     Status: Abnormal   Collection Time   11/15/11  6:25 AM      Component Value Range Comment   Sodium 135  135 - 145 mEq/L    Potassium 3.9  3.5 - 5.1 mEq/L    Chloride 96  96 - 112 mEq/L    CO2 28  19 - 32 mEq/L    Glucose, Bld 103 (*) 70 - 99 mg/dL    BUN 30 (*) 6 - 23 mg/dL    Creatinine, Ser 4.74  0.50 - 1.35 mg/dL    Calcium 25.9  8.4 - 10.5 mg/dL    Total Protein 7.0  6.0 - 8.3 g/dL    Albumin 3.5  3.5 - 5.2 g/dL    AST 18  0 - 37 U/L    ALT 38  0 - 53 U/L    Alkaline Phosphatase 58  39 - 117 U/L    Total Bilirubin 0.5  0.3 - 1.2 mg/dL    GFR calc non Af Amer 65 (*) >90 mL/min    GFR calc Af Amer 76 (*) >90  mL/min      HEENT: normal Cardio: RRR Resp: CTA B/L GI: BS positive Extremity:  No Edema Skin:   Intact Neuro: Confused, Abnormal Sensory absent sensation L side, Abnormal Motor 1/5 finger flex and arm ext, 1/5 hip/knee extensory synergy, Abnormal FMC Reflexes 3+, Reflexes: 3+, Tone:  Hypertonia, Inattention and Other L homonymous hemianopsia Musc/Skel:  Normal Better visuospatial awareness on the left.  Assessment/Plan: 1. Functional deficits secondary to R thalamic bleed which require 3+ hours per day of interdisciplinary therapy in a comprehensive inpatient rehab setting. Physiatrist is providing close team supervision and 24 hour management of active medical problems listed below. Physiatrist and rehab team continue to assess barriers to discharge/monitor patient progress toward functional and medical goals. FIM: FIM - Bathing Bathing Steps Patient Completed: Chest;Abdomen;Front perineal area Bathing: 2: Max-Patient completes 3-4 28f 10 parts or 25-49%  FIM - Upper Body Dressing/Undressing Upper body dressing/undressing steps patient completed: Put head through opening of pull over shirt/dress Upper body dressing/undressing: 2: Max-Patient completed 25-49% of tasks FIM - Lower Body Dressing/Undressing Lower body dressing/undressing steps patient completed: Thread/unthread right pants leg Lower body dressing/undressing: 1: Total-Patient completed less than  25% of tasks  FIM - Toileting Toileting: 1: Total-Patient completed zero steps, helper did all 3  FIM - Diplomatic Services operational officer Devices: Grab bars Toilet Transfers: 3-To toilet/BSC: Mod A (lift or lower assist);3-From toilet/BSC: Mod A (lift or lower assist)  FIM - Bed/Chair Transfer Bed/Chair Transfer Assistive Devices: Bed rails Bed/Chair Transfer: 2: Supine > Sit: Max A (lifting assist/Pt. 25-49%);2: Bed > Chair or W/C: Max A (lift and lower assist);2: Chair or W/C > Bed: Max A (lift and lower assist)  FIM - Locomotion: Wheelchair Locomotion: Wheelchair: 1: Total Assistance/staff pushes wheelchair (Pt<25%) FIM - Locomotion: Ambulation Locomotion: Ambulation Assistive Devices: Other (comment) (HHA) Ambulation/Gait Assistance: 1: +2 Total assist Locomotion: Ambulation: 1: Two helpers  Comprehension Comprehension Mode: Auditory Comprehension: 3-Understands basic 50 - 74% of the time/requires cueing 25 - 50%  of the time  Expression Expression Mode: Verbal Expression: 3-Expresses basic 50 - 74% of the time/requires cueing 25 - 50% of the time. Needs to repeat parts of sentences.  Social Interaction Social Interaction: 2-Interacts appropriately 25 - 49% of time - Needs frequent redirection.  Problem Solving Problem Solving: 2-Solves basic 25 - 49% of the time - needs direction more than half the time to initiate, plan or complete simple activities  Memory Memory: 3-Recognizes or recalls 50 - 74% of the time/requires cueing 25 - 49% of the time  2. Anticoagulation/DVT prophylaxis with non-Pharmaceutical:  Medical Problem List and Plan:  1. 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 DVT Prophylaxis/Anticoagulation: Subcutaneous Lovenox initiated 10/31/2011. Monitor platelet counts and any signs of bleeding  3. Mood: Continue Ritalin, now is on bid. Check sleep chart  4. Neuropsych: This patient is not  capable of making decisions on his/her own behalf at this time.He has very poor awareness of deficits and grossly overestimates his physical and cognitive abilities  5. Hypertension. Norvasc 10 mg daily, clonidine patch 0.1 mg weekly, hydrochlorothiazide 25 mg daily, lisinopril 20 mg a.m. and 10 mg at bedtime. Monitor with increased mobility  6. Dysphagia. Mechanical soft diet thin liquids. Speech therapy followup. Monitor for any signs of aspiration. Need to watch for inappropriate drinking/swallowing technique due to his impulsivity.     LOS (Days) 4 A FACE TO FACE EVALUATION WAS PERFORMED  Ezekiah Massie T 11/18/2011, 5:56 AM

## 2011-11-18 NOTE — Progress Notes (Signed)
Occupational Therapy Session Note  Patient Details  Name: Anthony Hardin MRN: [id number] Date of Birth: Mar 10, 1951  Today's Date: 11/18/2011 Time: 0905-1001 Time Calculation (min): 56 min  Session 2 Time:  15:05-15:38 Time Calculation (min):  33 mins  Short Term Goals: Week 1:  OT Short Term Goal 1 (Week 1): Pt will perform grooming activities seated at sink w/ Mod Assist OT Short Term Goal 1 - Progress (Week 1): Progressing toward goal OT Short Term Goal 2 (Week 1): Pt will perform UB bathing with Mod assist and  VC/TC's while seated at sink OT Short Term Goal 2 - Progress (Week 1): Progressing toward goal OT Short Term Goal 3 (Week 1): Pt will perform UB dressing/Hemi techniques seated with Mod Assist OT Short Term Goal 3 - Progress (Week 1): Progressing toward goal OT Short Term Goal 4 (Week 1): Pt will demonstrate task initiation for ADL/selfcare tasks on 1 out 3 occassions with verbal, visual or tactile cues OT Short Term Goal 4 - Progress (Week 1): Progressing toward goal  Skilled Therapeutic Interventions/Progress Updates:    Session 1:  Bathing and dressing at the sink.  Pt needing max step by step cueing to complete bathing and dressing.  Unable to process and complete any two step commands.  Needs max instructional cueing to locate any item on the sink that is at midline or to the left.  Pt with decreased/delayed initiation of all tasks as well, secondary to decreased cognitive processing. Pt internally distracted at times and needs max cueing for sustained attention.  Needs max assist to integrate the LUE into function as a stabilizer.  Mod assist for sit to stand, however when therapist asked pt to scoot forward he stood up times two.    Session 2:  Worked on sequencing through bed mobility to transition from supine to the right side and into sitting.  Pt needs max instructional cueing to sequence and is unable to consistently recall the steps to initiate rolling (bend up the  knee, bring the left arm across).  Mod assist overall to transition to sitting from supine.  Transferred to wheelchair and rolled in front of the sink to use his electric razor.  Pt needed min assist for thoroughness for shaving and to keep from attempting to use the razor on his mouth.  Educated pt and pt's sister on positioning of the LUE as well as questioning pt and letting him think through problems and tasks instead of doing it for him.  Encouraged them to wear his taped glasses for diplopia instead of patching.   Therapy Documentation Precautions:  Precautions Precautions: Fall Precaution Comments: left hemiparesis and left visual field deficit and neglect Restrictions Weight Bearing Restrictions: No  Pain: Pain Assessment Pain Assessment: No/denies pain ADL: See FIM for current functional status  Therapy/Group: Individual Therapy  Micco Bourbeau OTR/L 11/18/2011, 3:49 PM

## 2011-11-18 NOTE — Progress Notes (Signed)
Speech Language Pathology Daily Session Note  Patient Details  Name: Anthony Hardin MRN: [id number] Date of Birth: [birth year]  Today's Date: 11/18/2011 Time: 0800-0845 Time Calculation (min): 45 min  Short Term Goals: Week 1: SLP Short Term Goal 1 (Week 1): Patient will sustain attention to a basic familiar task for 2 minutes with mod multimodal cues.   SLP Short Term Goal 2 (Week 1): Patient will demonstrate emergent awareness by asking for help when needed with max assist verbal cues.   SLP Short Term Goal 3 (Week 1): Patient will solve basic familiar problems with max assist multimodal cues.   SLP Short Term Goal 4 (Week 1): Patient will consume dys. 3 textures and thin liquids with min assist verbal cues with no overt s/s of aspiration.    Skilled Therapeutic Interventions: Treatment session focused on addressing cognitive goals during a basic, familiar task. Patient sustained attention to self-feeding task for 1 minute increments with verbal and tactile cue to initiate task then patient was able to repeat scooping and bring to mouth for 3 bites with mod assist cues to pace, monitor bolus in left check and manage anterior loss of bolus. Patient required max assist to switch between tasks/items during self-feeding. Patient displayed no overt s/s of aspiration throughout Dys.3 texture and thin liquid meal. Pt's sister present throughout session and educated on swallowing compensatory strategies and use of appropriate cueing. Pt's sister verbalized and demonstrated understanding.    FIM:  Comprehension Comprehension Mode: Auditory Comprehension: 3-Understands basic 50 - 74% of the time/requires cueing 25 - 50%  of the time Expression Expression Mode: Verbal Expression: 3-Expresses basic 50 - 74% of the time/requires cueing 25 - 50% of the time. Needs to repeat parts of sentences. Social Interaction Social Interaction: 3-Interacts appropriately 50 - 74% of the time - May be physically or  verbally inappropriate. Problem Solving Problem Solving: 2-Solves basic 25 - 49% of the time - needs direction more than half the time to initiate, plan or complete simple activities Memory Memory: 2-Recognizes or recalls 25 - 49% of the time/requires cueing 51 - 75% of the time FIM - Eating Eating Activity: 4: Helper occasionally scoops food on utensil  Pain Pain Assessment Pain Assessment: No/denies pain  Therapy/Group: Individual Therapy  Azion Centrella 11/18/2011, 8:53 AM

## 2011-11-18 NOTE — Progress Notes (Signed)
I have read and agree with the following treatment session note.  Jessen Siegman Hall, PT, DPT  

## 2011-11-18 NOTE — Progress Notes (Signed)
Physical Therapy Session Note  Patient Details  Name: Anthony Hardin MRN: [id number] Date of Birth: December 22, 1950  Today's Date: 11/18/2011 Time: 1100-1200 Time Calculation (min): 60 min  Short Term Goals: Week 1:  PT Short Term Goal 1 (Week 1): pt will be able to SPT with ModA.   PT Short Term Goal 2 (Week 1): pt will perform bed mobility with rail with MinA.   PT Short Term Goal 3 (Week 1): pt will be able to stand during a functional task with MinA.   PT Short Term Goal 4 (Week 1): pt will ambulate with with Max A 20'.    Skilled Therapeutic Interventions/Progress Updates:      Therapy Documentation Precautions:  Precautions Precautions: Fall Precaution Comments: pt with L neglect.   Restrictions Weight Bearing Restrictions: No  Pain: Pain Assessment Pain Assessment: No/denies pain  Locomotion : Ambulation Ambulation: Yes Ambulation/Gait Assistance: 2: Max assist Ambulation Distance (Feet): 30 Feet   Patient ambulated 30 feet with bilateral HHA and mod assist x 2. Patient required verbal and tactile cues for initiation, sequencing, posture, and progression of LLE. Patent required verbal cues to focus attention on task.   Balance:  Dynamic sitting balance was perform at edge of mat with reaching activity. Patient was required to attend to L side, reach for building object at midline and to the L, and problem solve how to build given structure. Patient required verbal cues throughout process in regards to initiation, sequencing and focusing. Patient demonstrated the ability to verbally express sequence of desired task, however was unable to carry out necessary movements without initiation from therapist. Patient demonstrated the ability to grasp (with supervision to min assist) and release objects (with verbal cueing) with LUE. Patient showed increased difficulty initially with task, however was able to comprehend/problem solve easier toward end of task.  Exercises: Patient  performed NuStep with BUEs and BLEs x 11 min at level 4 with emphasis on improving endurance and strength. Patient was distracted several times while using equipment in which he would stop and require verbal cues to continue. Patient required max assist keeping LUE/hand on handle in form of ACE bandage. Patient required min assist keeping LLE in proper alignment when performing exercise secondary to lack of muscle activation.    Other Treatment: Patient performed stand pivot transfer from w/c <> NuStep and from mat > w/c with mod assist. Patient required less verbal cues for initiation of transfers today.   See FIM for current functional status  Therapy/Group: Individual Therapy  Shiesha Jahn Hamilton DPT Student  11/18/2011, 12:27 PM

## 2011-11-19 ENCOUNTER — Inpatient Hospital Stay (HOSPITAL_COMMUNITY): Payer: BC Managed Care – PPO | Admitting: *Deleted

## 2011-11-19 NOTE — Progress Notes (Signed)
Occupational Therapy Note  Patient Details  Name: Anthony Hardin MRN: [id number] Date of Birth: Nov 09, 1950 Today's Date: 11/19/2011 Time:  1345-1430  (45 min) Individual Therapy Pain:  None  Engaged in neuromuscular re education of Left side.  Practiced several minutes on disassociation of left hip/ right hip from upper trunk and weight shifting to left.  Pt. Needed manual cues and 1 step simple, short directions and demonstration to process the information.  Demonstrated decreased motor planning, decreased auditory processing and decreased sustained attention throughout the session.  Pt. Motivated to know what he needs to work on and OT discussed the weight shifting laterally and trunk elongation.  Sister present and verbalized and demonstrated understanding of the the exercises.  Practiced sit to stand for balance, upright,midline control, weight shifting and transitional stepping.     [name] 11/19/2011, 5:58 PM

## 2011-11-19 NOTE — Progress Notes (Signed)
Patient ID: Anthony Hardin, male   DOB: 1950-04-15, 61 y.o.   MRN: [id number] Admitted 10/31/2011 with diffuse weakness and unable to move and fell out of bed onto the floor. MRI of the brain showed a 4 x 1 x 2.5 cm acute hematoma right thalamus with mass effect and displacement distortion of the third ventricle but no suspicion of obstructive hydrocephalus. MRA of the head with no stenosis or occlusion.   Subjective/Complaints: Seems to have slept well. No complaints today    Review of Systems  Musculoskeletal: Negative for myalgias.  Neurological: Positive for sensory change and focal weakness.  All other systems reviewed and are negative.     Objective: Vital Signs: Blood pressure 117/60, pulse 59, temperature 97.8 F (36.6 C), temperature source Oral, resp. rate 18, height 5\' 9"  (1.753 m), weight 75.6 kg (166 lb 10.7 oz), SpO2 100.00%. No results found. No results found for this or any previous visit (from the past 72 hour(s)).   HEENT: normal Cardio: RRR Resp: CTA B/L GI: BS positive Extremity:  No Edema Skin:   Intact Neuro: Confused, Abnormal Sensory absent sensation L side, Abnormal Motor 1/5 finger flex and arm ext, 1/5 hip/knee extensory synergy, Abnormal FMC Reflexes 3+, Reflexes: 3+, Tone:  Hypertonia, Inattention and Other L homonymous hemianopsia Musc/Skel:  Normal Better visuospatial awareness on the left. Much more clear and focused this am with thought processing  Assessment/Plan: 1. Functional deficits secondary to R thalamic bleed which require 3+ hours per day of interdisciplinary therapy in a comprehensive inpatient rehab setting. Physiatrist is providing close team supervision and 24 hour management of active medical problems listed below. Physiatrist and rehab team continue to assess barriers to discharge/monitor patient progress toward functional and medical goals. FIM: FIM - Bathing Bathing Steps Patient Completed: Chest;Right Arm;Abdomen;Right upper  leg;Left upper leg Bathing: 3: Mod-Patient completes 5-7 57f 10 parts or 50-74%  FIM - Upper Body Dressing/Undressing Upper body dressing/undressing steps patient completed: Put head through opening of pull over shirt/dress Upper body dressing/undressing: 2: Max-Patient completed 25-49% of tasks FIM - Lower Body Dressing/Undressing Lower body dressing/undressing steps patient completed: Thread/unthread right pants leg;Don/Doff right sock Lower body dressing/undressing: 2: Max-Patient completed 25-49% of tasks  FIM - Toileting Toileting: 1: Total-Patient completed zero steps, helper did all 3  FIM - Diplomatic Services operational officer Devices: Grab bars Toilet Transfers: 3-To toilet/BSC: Mod A (lift or lower assist);3-From toilet/BSC: Mod A (lift or lower assist)  FIM - Bed/Chair Transfer Bed/Chair Transfer Assistive Devices: Bed rails Bed/Chair Transfer: 3: Supine > Sit: Mod A (lifting assist/Pt. 50-74%/lift 2 legs;3: Bed > Chair or W/C: Mod A (lift or lower assist)  FIM - Locomotion: Wheelchair Locomotion: Wheelchair: 1: Total Assistance/staff pushes wheelchair (Pt<25%) FIM - Locomotion: Ambulation Locomotion: Ambulation Assistive Devices: Other (comment) (Bilateral HHA) Ambulation/Gait Assistance: 2: Max assist Locomotion: Ambulation: 1: Travels less than 50 ft with maximal assistance (Pt: 25 - 49%)  Comprehension Comprehension Mode: Auditory Comprehension: 3-Understands basic 50 - 74% of the time/requires cueing 25 - 50%  of the time  Expression Expression Mode: Verbal Expression: 3-Expresses basic 50 - 74% of the time/requires cueing 25 - 50% of the time. Needs to repeat parts of sentences.  Social Interaction Social Interaction: 3-Interacts appropriately 50 - 74% of the time - May be physically or verbally inappropriate.  Problem Solving Problem Solving: 2-Solves basic 25 - 49% of the time - needs direction more than half the time to initiate, plan or complete  simple activities  Memory Memory: 2-Recognizes or recalls 25 - 49% of the time/requires cueing 51 - 75% of the time  2. Anticoagulation/DVT prophylaxis with non-Pharmaceutical:  Medical Problem List and Plan:  1. 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 DVT Prophylaxis/Anticoagulation: Subcutaneous Lovenox initiated 10/31/2011. Monitor platelet counts and any signs of bleeding  3. Mood: Continue Ritalin, now is on bid. Check sleep chart  4. Neuropsych: This patient is not capable of making decisions on his/her own behalf at this time.He has very poor awareness of deficits and grossly overestimates his physical and cognitive abilities  5. Hypertension. Norvasc 10 mg daily, clonidine patch 0.1 mg weekly, hydrochlorothiazide 25 mg daily, lisinopril 20 mg a.m. and 10 mg at bedtime. Monitor with increased mobility  6. Dysphagia. Mechanical soft diet thin liquids. Speech therapy followup. Monitor for any signs of aspiration.   -good po intake at present   LOS (Days) 5 A FACE TO FACE EVALUATION WAS PERFORMED  Anthony Hardin T 11/19/2011, 6:00 AM

## 2011-11-20 ENCOUNTER — Inpatient Hospital Stay (HOSPITAL_COMMUNITY): Payer: BC Managed Care – PPO | Admitting: Occupational Therapy

## 2011-11-20 ENCOUNTER — Inpatient Hospital Stay (HOSPITAL_COMMUNITY): Payer: BC Managed Care – PPO | Admitting: Physical Therapy

## 2011-11-20 ENCOUNTER — Inpatient Hospital Stay (HOSPITAL_COMMUNITY): Payer: BC Managed Care – PPO | Admitting: Speech Pathology

## 2011-11-20 DIAGNOSIS — Z5189 Encounter for other specified aftercare: Secondary | ICD-10-CM

## 2011-11-20 DIAGNOSIS — I619 Nontraumatic intracerebral hemorrhage, unspecified: Secondary | ICD-10-CM

## 2011-11-20 NOTE — Progress Notes (Signed)
Occupational Therapy Session Note  Patient Details  Name: Anthony Hardin MRN: [id number] Date of Birth: March 18, 1950  Today's Date: 11/20/2011 Time: 6962-9528 Time Calculation (min): 15 min  Skilled Therapeutic Interventions/Progress Updates: Patient participated in 1:1 OT session this pm to address neuromuscular reeducation during transitional movements. Patient has significant somatosensory loss, and therefore despite the fact that patient has active movement at every joint, he has difficulty accessing it volitionally for functional activity.       Therapy Documentation Precautions:  Precautions Precautions: Fall Precaution Comments: left hemiparesis and left visual field deficit and neglect Restrictions Weight Bearing Restrictions: No   Pain: Reported mild discomfort in left shoulder with weight bearing.  Relieved with reposition.    See FIM for current functional status  Therapy/Group: Individual Therapy  Collier Salina 11/20/2011, 1:59 PM

## 2011-11-20 NOTE — Progress Notes (Signed)
Occupational Therapy Session Note  Patient Details  Name: Anthony Hardin MRN: [id number] Date of Birth: [birth year]  Today's Date: 11/20/2011 Time: 1045-1100 Time Calculation (min): 15 min  Short Term Goals: Week 1:  OT Short Term Goal 1 (Week 1): Pt will perform grooming activities seated at sink w/ Mod Assist OT Short Term Goal 1 - Progress (Week 1): Progressing toward goal OT Short Term Goal 2 (Week 1): Pt will perform UB bathing with Mod assist and  VC/TC's while seated at sink OT Short Term Goal 2 - Progress (Week 1): Progressing toward goal OT Short Term Goal 3 (Week 1): Pt will perform UB dressing/Hemi techniques seated with Mod Assist OT Short Term Goal 3 - Progress (Week 1): Progressing toward goal OT Short Term Goal 4 (Week 1): Pt will demonstrate task initiation for ADL/selfcare tasks on 1 out 3 occassions with verbal, visual or tactile cues OT Short Term Goal 4 - Progress (Week 1): Progressing toward goal  Skilled Therapeutic Interventions/Progress Updates:    Neuromuscular reeducation to left upper extremity , head / neck and trunk.  Patient with limited external rotation passively, and unable to reduce activity in pectoralis muscle, even with deep breathing.  Patient recruiting back extensors, head/neck extensors with most attempts to move left arm.  Patient with limited weight acceptance onto left hip, or into left environment.  Able to verbalize "it feels weird."  Patient encouraged to quiet verbalizations, to allow body to relearn movements in head/neck/trunk and arm.    Therapy Documentation Precautions:  Precautions Precautions: Fall Precaution Comments: left hemiparesis and left visual field deficit and neglect Restrictions Weight Bearing Restrictions: No Pain: Pain Assessment Pain Assessment: No/denies pain   See FIM for current functional status  Therapy/Group: Individual Therapy  Collier Salina 11/20/2011, 11:21 AM

## 2011-11-20 NOTE — Progress Notes (Signed)
Patient ID: Anthony Hardin, male   DOB: 1950-10-25, 61 y.o.   MRN: [id number] Admitted 10/31/2011 with diffuse weakness and unable to move and fell out of bed onto the floor. MRI of the brain showed a 4 x 1 x 2.5 cm acute hematoma right thalamus with mass effect and displacement distortion of the third ventricle but no suspicion of obstructive hydrocephalus. MRA of the head with no stenosis or occlusion.   Subjective/Complaints: Seems to have slept well. No complaints today    Review of Systems  Musculoskeletal: Negative for myalgias.  Neurological: Positive for sensory change and focal weakness.  All other systems reviewed and are negative.     Objective: Vital Signs: Blood pressure 132/84, pulse 62, temperature 97.8 F (36.6 C), temperature source Oral, resp. rate 18, height 5\' 9"  (1.753 m), weight 75.6 kg (166 lb 10.7 oz), SpO2 97.00%. No results found. No results found for this or any previous visit (from the past 72 hour(s)).   HEENT: normal Cardio: RRR Resp: CTA B/L GI: BS positive Extremity:  No Edema Skin:   Intact Neuro: Confused, Abnormal Sensory absent sensation L side, Abnormal Motor 2/5 finger flex and arm ext, 2/5 hip/knee extensory synergy,0/5 L Ankle Abnormal FMC Reflexes 3+, Reflexes: 3+, Tone:  Hypertonia, Inattention and Other L homonymous hemianopsia Musc/Skel:  Normal Better visuospatial awareness on the left. Much more clear and focused this am with thought processing Sensation absent L arm and leg Assessment/Plan: 1. Functional deficits secondary to R thalamic bleed which require 3+ hours per day of interdisciplinary therapy in a comprehensive inpatient rehab setting. Physiatrist is providing close team supervision and 24 hour management of active medical problems listed below. Physiatrist and rehab team continue to assess barriers to discharge/monitor patient progress toward functional and medical goals. FIM: FIM - Bathing Bathing Steps Patient  Completed: Chest;Left Arm;Abdomen Bathing: 2: Max-Patient completes 3-4 35f 10 parts or 25-49%  FIM - Upper Body Dressing/Undressing Upper body dressing/undressing steps patient completed: Put head through opening of pull over shirt/dress Upper body dressing/undressing: 1: Total-Patient completed less than 25% of tasks FIM - Lower Body Dressing/Undressing Lower body dressing/undressing steps patient completed: Thread/unthread right pants leg;Don/Doff right sock Lower body dressing/undressing: 1: Two helpers  FIM - Toileting Toileting: 1: Two helpers  FIM - Diplomatic Services operational officer Devices: Therapist, music Transfers: 4-To toilet/BSC: Min A (steadying Pt. > 75%)  FIM - Bed/Chair Transfer Bed/Chair Transfer Assistive Devices: Bed rails Bed/Chair Transfer: 4: Supine > Sit: Min A (steadying Pt. > 75%/lift 1 leg)  FIM - Locomotion: Wheelchair Locomotion: Wheelchair: 1: Total Assistance/staff pushes wheelchair (Pt<25%) FIM - Locomotion: Ambulation Locomotion: Ambulation Assistive Devices: Other (comment) (Bilateral HHA) Ambulation/Gait Assistance: 2: Max assist Locomotion: Ambulation: 1: Travels less than 50 ft with maximal assistance (Pt: 25 - 49%)  Comprehension Comprehension Mode: Auditory Comprehension: 3-Understands basic 50 - 74% of the time/requires cueing 25 - 50%  of the time  Expression Expression Mode: Verbal Expression: 3-Expresses basic 50 - 74% of the time/requires cueing 25 - 50% of the time. Needs to repeat parts of sentences.  Social Interaction Social Interaction: 3-Interacts appropriately 50 - 74% of the time - May be physically or verbally inappropriate.  Problem Solving Problem Solving: 2-Solves basic 25 - 49% of the time - needs direction more than half the time to initiate, plan or complete simple activities  Memory Memory: 3-Recognizes or recalls 50 - 74% of the time/requires cueing 25 - 49% of the time  2. Anticoagulation/DVT  prophylaxis with non-Pharmaceutical:  Medical Problem List and Plan:  1. 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 DVT Prophylaxis/Anticoagulation: Subcutaneous Lovenox initiated 10/31/2011. Monitor platelet counts and any signs of bleeding  3. Mood: Continue Ritalin, now is on bid. Check sleep chart  4. Neuropsych: This patient is not capable of making decisions on his/her own behalf at this time.He has very poor awareness of deficits and grossly overestimates his physical and cognitive abilities  5. Hypertension. Norvasc 10 mg daily, clonidine patch 0.1 mg weekly, hydrochlorothiazide 25 mg daily, lisinopril 20 mg a.m. and 10 mg at bedtime. Monitor with increased mobility  6. Dysphagia. Mechanical soft diet thin liquids. Speech therapy followup. Monitor for any signs of aspiration.     LOS (Days) 6 A FACE TO FACE EVALUATION WAS PERFORMED  KIRSTEINS,ANDREW E 11/20/2011, 7:16 AM

## 2011-11-20 NOTE — Progress Notes (Signed)
Speech Language Pathology Daily Session Note  Patient Details  Name: Anthony Hardin MRN: [id number] Date of Birth: August 30, 1950  Today's Date: 11/20/2011 Time: 4098-1191 Time Calculation (min): 55 min  Short Term Goals: Week 1: SLP Short Term Goal 1 (Week 1): Patient will sustain attention to a basic familiar task for 2 minutes with mod multimodal cues.   SLP Short Term Goal 2 (Week 1): Patient will demonstrate emergent awareness by asking for help when needed with max assist verbal cues.   SLP Short Term Goal 3 (Week 1): Patient will solve basic familiar problems with max assist multimodal cues.   SLP Short Term Goal 4 (Week 1): Patient will consume dys. 3 textures and thin liquids with min assist verbal cues with no overt s/s of aspiration.    Skilled Therapeutic Interventions: Treatment session focused on addressing cognitive goals during a basic, familiar task. SLP facilitated session with self-feeding task with max assist verbal cues and min assist tactile cues to sequence and problem solve set up and switch between items/tasks during meal.  Patient required supervision level verbal cues to follow safe swallow compensatory strategies and displayed no overt s/s of aspiration.  Patient attend to tasks for about 1 minute increments through session.     FIM:  Comprehension Comprehension Mode: Auditory Comprehension: 3-Understands basic 50 - 74% of the time/requires cueing 25 - 50%  of the time Expression Expression Mode: Verbal Expression: 5-Expresses basic needs/ideas: With extra time/assistive device Social Interaction Social Interaction: 5-Interacts appropriately 90% of the time - Needs monitoring or encouragement for participation or interaction. Problem Solving Problem Solving: 2-Solves basic 25 - 49% of the time - needs direction more than half the time to initiate, plan or complete simple activities Memory Memory: 3-Recognizes or recalls 50 - 74% of the time/requires cueing 25 -  49% of the time FIM - Eating Eating Activity: 5: Set-up assist for open containers;5: Supervision/cues  Pain Pain Assessment Pain Assessment: No/denies pain  Therapy/Group: Individual Therapy  Charlane Ferretti., CCC-SLP [phone]  Anthony Hardin 11/20/2011, 10:08 AM

## 2011-11-20 NOTE — Progress Notes (Signed)
Social Work Patient ID: Anthony Hardin, male   DOB: Aug 10, 1950, 61 y.o.   MRN: [id number] Met with pt's sister to discuss team conference and follow up.  Will contact sister and she will conference with brother at 1:00. She met with MD this am to address questions and concerns.  Continue to work on discharge plans.  Pt making good progress.

## 2011-11-20 NOTE — Progress Notes (Signed)
I have read and agree with the following treatment session note.  Brookley Spitler Hall, PT, DPT  

## 2011-11-20 NOTE — Progress Notes (Signed)
Physical Therapy Note  Patient Details  Name: Anthony Hardin MRN: [id number] Date of Birth: [birth year] Today's Date: 11/20/2011  16:00-16:30 individual therapy pt denied pain.  Stand pivot transfer to toilet mod assist both ways with assist to place left foot. Pt managed clothing with min assist to steady and vc for left inattention and for technique. Pt performed hand washing task with min question cues for sequence and sustained attention.  Julian Reil 11/20/2011, 4:49 PM

## 2011-11-20 NOTE — Progress Notes (Signed)
Occupational Therapy Session Note  Patient Details  Name: Anthony Hardin MRN: [id number] Date of Birth: 07-05-1950  Today's Date: 11/20/2011 Time: 1100-1200 and 1350-1415 Time Calculation (min): 60 min and 25 min  Short Term Goals: Week 1:  OT Short Term Goal 1 (Week 1): Pt will perform grooming activities seated at sink w/ Mod Assist OT Short Term Goal 1 - Progress (Week 1): Progressing toward goal OT Short Term Goal 2 (Week 1): Pt will perform UB bathing with Mod assist and  VC/TC's while seated at sink OT Short Term Goal 2 - Progress (Week 1): Progressing toward goal OT Short Term Goal 3 (Week 1): Pt will perform UB dressing/Hemi techniques seated with Mod Assist OT Short Term Goal 3 - Progress (Week 1): Progressing toward goal OT Short Term Goal 4 (Week 1): Pt will demonstrate task initiation for ADL/selfcare tasks on 1 out 3 occassions with verbal, visual or tactile cues OT Short Term Goal 4 - Progress (Week 1): Progressing toward goal  Skilled Therapeutic Interventions/Progress Updates:    1) Pt seen for ADL retraining at walk-in shower level.  Pt required mod-max cues for each step of bathing and dressing tasks.  Pt with increased sequencing with UB bathing with cues to initiate bathing chest and pt was able to progress to abdomen and front perineal area without cues.  Pt required increased cues with LB bathing.  Completed bathing at sit to stand level with mod progressing to min assist throughout session.  Pt with increased carryover of bathing technique at shower level.  Pt continues to demonstrate decreased/delayed initiation of all tasks due to decreased cognitive processing and occasionally attempting to over think movements.  2) 1:1 OT with focus on NM re-ed with transitional movements and weight shifting through Lt side and down to Lt elbow.  Increased attention to LUE due to increased tone/tightness in pecs and decreased movement through external rotation.  Noted increased  active movement at wrist and digits, however pt had difficulty accessing movement through shoulder and advancing Lt arm forward.  Focus on increased attention to Lt environment.  Therapy Documentation Precautions:  Precautions Precautions: Fall Precaution Comments: left hemiparesis and left visual field deficit and neglect Restrictions Weight Bearing Restrictions: No Pain: Pain Assessment Pain Assessment: No/denies pain  See FIM for current functional status  Therapy/Group: Individual Therapy  Leonette Monarch 11/20/2011, 4:08 PM

## 2011-11-20 NOTE — Progress Notes (Signed)
Physical Therapy Session Note  Patient Details  Name: Anthony Hardin MRN: [id number] Date of Birth: 30-Sep-1950  Today's Date: 11/20/2011 Time: 1415-1500 Time Calculation (min): 45 min     Therapy Documentation Precautions:  Precautions Precautions: Fall Precaution Comments: left hemiparesis and left visual field deficit and neglect Restrictions Weight Bearing Restrictions: No   Pain: Pain Assessment Pain Assessment: No/denies pain  Locomotion : Ambulation Ambulation: Yes Ambulation/Gait Assistance: 1: +2 Total assist Ambulation Distance (Feet): 40 Feet  Patient ambulated 40 feet with bilateral HHA and mod to max assist of 2. Patient required verbal cues for initiation,  sequencing and posture. Patient required mod assist for LLE progression. Patient tolerated activity well, even though patient reported he was "very tired" prior to walking.     Other Treatments:   Patient performed standing reaching activity where patient had to reach with RUE across midline to the L for playing card and pair card with matching card on table. Task required patient to attend to his left side, scan, problem solve, and locate appropriate card prior for placement while also challenging standing balance, weight shifting to the L and extending through LLE. Patient was able to perform task for several minutes in standing requiring verbal cues and mod assist to maintain proper posture and encourage weight bearing through LLE.   Patient performed same playing card activity in sitting with continued emphasis on attention, focusing, scanning and problem solving, as well as challenging sitting balance with reaching task. Patient verbally processes task, reciting step by step instructions to himself. PT challenged patient to process steps internally, hoping to initiate automatic type movements. Patient performed reach with both L and R UEs. Patient required increased time to complete task with LUE and had  increased difficulty with pincer grasp on L, however was able to perform when focused on LUE.   At the end, patient was instructed to pick up cards and place in therapists hand. Patient presented with increased difficulty switching tasks, resulting in increased time to complete task. Patient required frequent verbal reminders of goal of task.  See FIM for current functional status  Therapy/Group: Individual Therapy  Hadiyah Maricle Hamilton DPT Student  11/20/2011, 3:35 PM

## 2011-11-21 ENCOUNTER — Inpatient Hospital Stay (HOSPITAL_COMMUNITY): Payer: BC Managed Care – PPO | Admitting: Occupational Therapy

## 2011-11-21 ENCOUNTER — Inpatient Hospital Stay (HOSPITAL_COMMUNITY): Payer: BC Managed Care – PPO | Admitting: Physical Therapy

## 2011-11-21 ENCOUNTER — Inpatient Hospital Stay (HOSPITAL_COMMUNITY): Payer: BC Managed Care – PPO | Admitting: Speech Pathology

## 2011-11-21 ENCOUNTER — Encounter (HOSPITAL_COMMUNITY): Payer: BC Managed Care – PPO

## 2011-11-21 DIAGNOSIS — Z5189 Encounter for other specified aftercare: Secondary | ICD-10-CM

## 2011-11-21 DIAGNOSIS — I619 Nontraumatic intracerebral hemorrhage, unspecified: Secondary | ICD-10-CM

## 2011-11-21 DIAGNOSIS — G8110 Spastic hemiplegia affecting unspecified side: Secondary | ICD-10-CM

## 2011-11-21 NOTE — Progress Notes (Signed)
Occupational Therapy Session Note  Patient Details  Name: Anthony Hardin MRN: [id number] Date of Birth: Aug 28, 1950  Today's Date: 11/21/2011 Time: 1030-1128 Time Calculation (min): 58 min  Short Term Goals: Week 1:  OT Short Term Goal 1 (Week 1): Pt will perform grooming activities seated at sink w/ Mod Assist OT Short Term Goal 1 - Progress (Week 1): Progressing toward goal OT Short Term Goal 2 (Week 1): Pt will perform UB bathing with Mod assist and  VC/TC's while seated at sink OT Short Term Goal 2 - Progress (Week 1): Progressing toward goal OT Short Term Goal 3 (Week 1): Pt will perform UB dressing/Hemi techniques seated with Mod Assist OT Short Term Goal 3 - Progress (Week 1): Progressing toward goal OT Short Term Goal 4 (Week 1): Pt will demonstrate task initiation for ADL/selfcare tasks on 1 out 3 occassions with verbal, visual or tactile cues OT Short Term Goal 4 - Progress (Week 1): Progressing toward goal  Skilled Therapeutic Interventions/Progress Updates:    Pt seen for ADL retraining at walk-in shower level. Pt required mod cues for each step of bathing and dressing tasks, with increased sequencing with UB bathing with cues to initiate first task and pt was able to progress to 3 more areas without cues. Question cues provided to sequence through rest of bathing.  Pt required increased cues with LB bathing. Pt demonstrating increased independence with sit to stand with min assist post alignment of LLE.  Pt with increased carryover of bathing technique at shower level. Pt continues to demonstrate decreased/delayed initiation of all tasks due to decreased cognitive processing and occasionally attempting to over think movements.  Completed shaving with electric razor at sink with max cues to shave Lt side of face and hand over hand assist with completing Lt side.  Therapy Documentation Precautions:  Precautions Precautions: Fall Precaution Comments: left hemiparesis and left  visual field deficit and neglect Restrictions Weight Bearing Restrictions: No Pain: Pain Assessment Pain Assessment: Faces Faces Pain Scale: Hurts a little bit  See FIM for current functional status  Therapy/Group: Individual Therapy  Leonette Monarch 11/21/2011, 12:34 PM

## 2011-11-21 NOTE — Progress Notes (Signed)
Speech Language Pathology Daily Session Note  Patient Details  Name: Anthony Hardin MRN: [id number] Date of Birth: 1951-03-06  Today's Date: 11/21/2011 Time: 0830-0925 Time Calculation (min): 55 min  Short Term Goals: Week 1: SLP Short Term Goal 1 (Week 1): Patient will sustain attention to a basic familiar task for 2 minutes with mod multimodal cues.   SLP Short Term Goal 2 (Week 1): Patient will demonstrate emergent awareness by asking for help when needed with max assist verbal cues.   SLP Short Term Goal 3 (Week 1): Patient will solve basic familiar problems with max assist multimodal cues.   SLP Short Term Goal 4 (Week 1): Patient will consume dys. 3 textures and thin liquids with min assist verbal cues with no overt s/s of aspiration.    Skilled Therapeutic Interventions: Treatment session focused on addressing cognitive goals during a basic, familiar task. SLP facilitated session with self-feeding task with mod assist verbal cues and min assist tactile cues to sequence and problem solve set up and switch between items/tasks during meal. Patient required supervision level verbal cues to follow safe swallow compensatory strategies and displayed no overt s/s of aspiration. Patient attended to tasks for about 1 minute increments through session.  Patient had questions and asked for feedback about his recovery so SLP educated patient on the rationale of rehab and maximizing functional independence while providing needed support.     FIM:  Comprehension Comprehension Mode: Auditory Comprehension: 3-Understands basic 50 - 74% of the time/requires cueing 25 - 50%  of the time Expression Expression Mode: Verbal Expression: 5-Expresses basic needs/ideas: With extra time/assistive device Social Interaction Social Interaction: 5-Interacts appropriately 90% of the time - Needs monitoring or encouragement for participation or interaction. Problem Solving Problem Solving: 2-Solves basic 25 -  49% of the time - needs direction more than half the time to initiate, plan or complete simple activities Memory Memory: 3-Recognizes or recalls 50 - 74% of the time/requires cueing 25 - 49% of the time FIM - Eating Eating Activity: 5: Set-up assist for open containers;5: Supervision/cues  Therapy/Group: Individual Therapy  [name], Conrad [place]  Graduate Clinician Speech Language Pathology   Page, [name] 11/21/2011, 11:51 AM   The above skilled treatment note has been reviewed and SLP is in agreement. [name], M.A., CCC-SLP [phone]

## 2011-11-21 NOTE — Progress Notes (Signed)
Patient ID: Anthony Hardin, male   DOB: [birth year], 62 y.o.   MRN: [id number] Admitted 10/31/2011 with diffuse weakness and unable to move and fell out of bed onto the floor. MRI of the brain showed a 4 x 1 x 2.5 cm acute hematoma right thalamus with mass effect and displacement distortion of the third ventricle but no suspicion of obstructive hydrocephalus. MRA of the head with no stenosis or occlusion.   Subjective/Complaints: " I feel scrambled"    Review of Systems  Musculoskeletal: Negative for myalgias.  Neurological: Positive for sensory change and focal weakness.  All other systems reviewed and are negative.   Objective: Vital Signs: Blood pressure 103/67, pulse 60, temperature 97.8 F (36.6 C), temperature source Oral, resp. rate 19, height 5\' 9"  (1.753 m), weight 75.6 kg (166 lb 10.7 oz), SpO2 97.00%. No results found. No results found for this or any previous visit (from the past 72 hour(s)).   HEENT: normal Cardio: RRR Resp: CTA B/L GI: BS positive Extremity:  No Edema Skin:   Intact Neuro: Confused, Abnormal Sensory absent sensation L side, Abnormal Motor 2/5 finger flex and arm ext, 2/5 hip/knee extensory synergy,0/5 L Ankle Abnormal FMC Reflexes 3+, Reflexes: 3+, Tone:  Hypertonia, Inattention and Other L homonymous hemianopsia Musc/Skel:  Normal Better visuospatial awareness on the left. Much more clear and focused this am with thought processing Sensation absent L arm and leg Assessment/Plan: 1. Functional deficits secondary to R thalamic bleed which require 3+ hours per day of interdisciplinary therapy in a comprehensive inpatient rehab setting. Physiatrist is providing close team supervision and 24 hour management of active medical problems listed below. Physiatrist and rehab team continue to assess barriers to discharge/monitor patient progress toward functional and medical goals. FIM: FIM - Bathing Bathing Steps Patient Completed: Chest;Left Arm;Abdomen;Front  perineal area;Right upper leg;Left upper leg Bathing: 3: Mod-Patient completes 5-7 30f 10 parts or 50-74%  FIM - Upper Body Dressing/Undressing Upper body dressing/undressing steps patient completed: Put head through opening of pull over shirt/dress;Pull shirt over trunk Upper body dressing/undressing: 2: Max-Patient completed 25-49% of tasks FIM - Lower Body Dressing/Undressing Lower body dressing/undressing steps patient completed: Thread/unthread right pants leg Lower body dressing/undressing: 1: Total-Patient completed less than 25% of tasks  FIM - Toileting Toileting: 1: Two helpers  FIM - Diplomatic Services operational officer Devices: Therapist, music Transfers: 4-To toilet/BSC: Min A (steadying Pt. > 75%)  FIM - Bed/Chair Transfer Bed/Chair Transfer Assistive Devices: Bed rails Bed/Chair Transfer: 4: Bed > Chair or W/C: Min A (steadying Pt. > 75%);3: Chair or W/C > Bed: Mod A (lift or lower assist)  FIM - Locomotion: Wheelchair Locomotion: Wheelchair: 1: Total Assistance/staff pushes wheelchair (Pt<25%) FIM - Locomotion: Ambulation Locomotion: Ambulation Assistive Devices: Other (comment) (Bilateral HHA) Ambulation/Gait Assistance: 1: +2 Total assist Locomotion: Ambulation: 1: Two helpers  Comprehension Comprehension Mode: Auditory Comprehension: 3-Understands basic 50 - 74% of the time/requires cueing 25 - 50%  of the time  Expression Expression Mode: Verbal Expression: 5-Expresses basic needs/ideas: With extra time/assistive device  Social Interaction Social Interaction: 5-Interacts appropriately 90% of the time - Needs monitoring or encouragement for participation or interaction.  Problem Solving Problem Solving: 2-Solves basic 25 - 49% of the time - needs direction more than half the time to initiate, plan or complete simple activities  Memory Memory: 3-Recognizes or recalls 50 - 74% of the time/requires cueing 25 - 49% of the time  2. Anticoagulation/DVT  prophylaxis with non-Pharmaceutical:  Medical Problem List and  Plan:  1. 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 DVT Prophylaxis/Anticoagulation: Subcutaneous Lovenox initiated 10/31/2011. Monitor platelet counts and any signs of bleeding  3. Mood: Continue Ritalin, now is on bid. Check sleep chart  4. Neuropsych: This patient is not capable of making decisions on his/her own behalf at this time.He has very poor awareness of deficits and grossly overestimates his physical and cognitive abilities  5. Hypertension. Norvasc 10 mg daily, clonidine patch 0.1 mg weekly, hydrochlorothiazide 25 mg daily, lisinopril 20 mg a.m. and 10 mg at bedtime. Monitor with increased mobility  6. Dysphagia. Mechanical soft diet thin liquids. Speech therapy followup. Monitor for any signs of aspiration.     LOS (Days) 7 A FACE TO FACE EVALUATION WAS PERFORMED  Petr Bontempo E 11/21/2011, 7:39 AM

## 2011-11-21 NOTE — Progress Notes (Signed)
Physical Therapy Weekly Progress Note  Patient Details  Name: Anthony Hardin MRN: [id number] Date of Birth: Jan 17, 1951  Today's Date: 11/21/2011 Time: 1478-2956 Time Calculation (min): 45 min  Patient is making steady progress towards PT LTG and has met 2 of 4 short term goals.  Patient is currently min-mod A overall for bed mobility with rail to R and bed <> w/c transfers but continues to require max-total verbal, visual cues for initiation, sequencing and attention to L environment for safe functional mobility.  Patient also continues to require +2 A for gait and stair negotiation secondary to continued impaired attention to and active contraction of LLE for swing and stance phases.  Patient continues to demonstrate the following deficits: decreased activity tolerance/endurance, L sided hemiplegia with impaired motor control, timing, sequencing, apraxia, impaired proprioception, impaired memory, initiation, sequencing, problem solving, awareness, L neglect, impaired postural control, balance and gait and therefore will continue to benefit from skilled PT intervention to enhance overall performance with activity tolerance, balance, postural control, ability to compensate for deficits, functional use of  left upper extremity and left lower extremity, attention, awareness and coordination.  Patient progressing toward long term goals..  Plan of care revisions: Transfer and w/c mobility goals downgraded to supervision overall secondary to significant cognitive/safety impairments.  PT Short Term Goals Week 1:  PT Short Term Goal 1 (Week 1): pt will be able to SPT with ModA.   PT Short Term Goal 1 - Progress (Week 1): Met PT Short Term Goal 2 (Week 1): pt will perform bed mobility with rail with MinA.   PT Short Term Goal 2 - Progress (Week 1): Met PT Short Term Goal 3 (Week 1): pt will be able to stand during a functional task with MinA.   PT Short Term Goal 3 - Progress (Week 1): Progressing  toward goal PT Short Term Goal 4 (Week 1): pt will ambulate with with Max A 20'.   PT Short Term Goal 4 - Progress (Week 1): Progressing toward goal Week 2:  PT Short Term Goal 1 (Week 2): Patient will perform bed mobility on flat bed min A to L and R PT Short Term Goal 2 (Week 2): Patient will perform functional transfers to L and R with min A and mod-max verbal cues for initiation, sequence and to attend to L PT Short Term Goal 3 (Week 2): Patient will perform w/c mobility on unit x 150 with mod A and mod-max verbal and visual cues to attend to L environment PT Short Term Goal 4 (Week 2): Patient will perform gait training on unit x 75' with max A of one person with increased LLE advancement and activation of LLE in stance.   Therapy Documentation Precautions:  Precautions Precautions: Fall Precaution Comments: left hemiparesis and left visual field deficit and neglect Restrictions Weight Bearing Restrictions: No Pain: Pain Assessment Pain Assessment: No/denies pain Other Treatments: Treatments Therapeutic Activity: Reviewed with patient w/c mobility on unit this time without cushion to allow for shorter STF height and allow better foot propulsion positioning.  Performed with min-mod A overall but still with max verbal and visual cues to attend to L environment, initiation and sequencing for turning to L.  Transfers w/c <> mat and w/c > bed with stand pivot with mod-max A secondary to impaired initiation of LLE advancement to pivot.  Sit > supine on flat mat and bed with min A to bring LLE fully onto bed and min-mod A rolling supine > prone.  Neuromuscular Facilitation: Left;Lower Extremity;Activity to increase coordination;Activity to increase motor control;Activity to increase timing and sequencing;Activity to increase sustained activation in prone during use of tactile/tapping cues for voluntary activation of L hamstring muscle for knee flexion; attempted concentric, isometric and  eccentric activation and added mirror for visual feedback of movement; trace activation noted.  Transitioned to quadruped over therapy ball for support for WB and forced use of LUE and LLE during forward/backward weight shifting over UE and attempting to extend RLE for forced use/WB through LLE; patient fatigued quickly and requested to return to his room to bed.    See FIM for current functional status  Therapy/Group: Individual Therapy  [name] [facility] 11/21/2011, 4:48 PM

## 2011-11-21 NOTE — Progress Notes (Signed)
I have read and agree with the following treatment session note.  Dinora Hemm Hall, PT, DPT  

## 2011-11-21 NOTE — Progress Notes (Signed)
Physical Therapy Session Note  Patient Details  Name: ELHADJI KLINEFELTER MRN: [id number] Date of Birth: 17-Dec-1950  Today's Date: 11/21/2011 Time: 7846-9629 Time Calculation (min): 45 min  Short Term Goals: Week 2:  PT Short Term Goal 1 (Week 2): Patient will perform bed mobility on flat bed min A to L and R PT Short Term Goal 2 (Week 2): Patient will perform functional transfers to L and R with min A and mod-max verbal cues for initiation, sequence and to attend to L PT Short Term Goal 3 (Week 2): Patient will perform w/c mobility on unit x 150 with mod A and mod-max verbal and visual cues to attend to L environment PT Short Term Goal 4 (Week 2): Patient will perform gait training on unit x 75' with max A of one person with increased LLE advancement and activation of LLE in stance.     Therapy Documentation Precautions:  Precautions Precautions: Fall Precaution Comments: left hemiparesis and left visual field deficit and neglect Restrictions Weight Bearing Restrictions: No Pain: Pain Assessment Pain Assessment: Faces Faces Pain Scale: Hurts a little bit Patient reports dull, mild "discomfort" on left side, as well as some muscle soreness on R side secondary to compensating with R UE and LE. Patient reports no pain at the end of session.  Other Treatments:   Patient performed sit <> stand transfer from w/c to mat and mat to w/c requiring min to mod assist. Patient continues to require verbal cues for initiation and sequencing and tactile cues for LLE progression and hand placement.  Patient ascended/descend five steps with bilateral hand rails and max assist secondary to requiring 2 helpers. Patient requires constant verbal cues for initiation and sequencing. Patient requires cueing for weight shifting bilaterally, standing tall through LEs, and tactile cues for progressing LLE and for muscle activation during stance phase on LLE.  Patient's w/c was changed for one that he could  manually propel. Patient demonstrated the ability to manage parts of w/c, including locking/unlocking brakes and releasing foot plates with total verbal cuing and intermittent hand over hand cues. Patient propelled himself ~75 feet from gym to room using RUE and RLE, requiring mod assist for cueing and directions. Patient required assistance for proper use of R foot to help propel and steer w/c. Patient demonstrated the ability to propel through doorways and around obstacles in hall with increased time to accomplish such tasks.   See FIM for current functional status  Therapy/Group: Individual Therapy  Tayah Idrovo Hamilton DPT Student  11/21/2011, 10:38 AM

## 2011-11-22 ENCOUNTER — Inpatient Hospital Stay (HOSPITAL_COMMUNITY): Payer: BC Managed Care – PPO | Admitting: Occupational Therapy

## 2011-11-22 ENCOUNTER — Encounter (HOSPITAL_COMMUNITY): Payer: BC Managed Care – PPO

## 2011-11-22 ENCOUNTER — Inpatient Hospital Stay (HOSPITAL_COMMUNITY): Payer: BC Managed Care – PPO | Admitting: Speech Pathology

## 2011-11-22 ENCOUNTER — Inpatient Hospital Stay (HOSPITAL_COMMUNITY): Payer: BC Managed Care – PPO | Admitting: Physical Therapy

## 2011-11-22 MED ORDER — BISACODYL 10 MG RE SUPP: 10.0000 mg | Freq: Every day | RECTAL | Status: DC | PRN

## 2011-11-22 MED ORDER — BOOST / RESOURCE BREEZE PO LIQD
1.0000 | Freq: Two times a day (BID) | ORAL | Status: DC
  Administered 2011-11-23 – 2011-11-28 (×10): 1 via ORAL

## 2011-11-22 NOTE — Progress Notes (Signed)
Patient ID: Anthony Hardin, male   DOB: 29-Jan-1951, 61 y.o.   MRN: [id number] Admitted 10/31/2011 with diffuse weakness and unable to move and fell out of bed onto the floor. MRI of the brain showed a 4 x 1 x 2.5 cm acute hematoma right thalamus with mass effect and displacement distortion of the third ventricle but no suspicion of obstructive hydrocephalus. MRA of the head with no stenosis or occlusion.   Subjective/Complaints: " I Can't feel L side well    Review of Systems  Musculoskeletal: Negative for myalgias.  Neurological: Positive for sensory change and focal weakness.  All other systems reviewed and are negative.   Objective: Vital Signs: Blood pressure 125/68, pulse 58, temperature 98.4 F (36.9 C), temperature source Oral, resp. rate 19, height 5\' 9"  (1.753 m), weight 75.6 kg (166 lb 10.7 oz), SpO2 98.00%. No results found. No results found for this or any previous visit (from the past 72 hour(s)).   HEENT: normal Cardio: RRR Resp: CTA B/L GI: BS positive Extremity:  No Edema Skin:   Intact Neuro: Confused, Abnormal Sensory absent sensation L side lt touch and proprio, Abnormal Motor 2/5 finger flex and arm ext, 2/5 hip/knee extensory synergy,0/5 L Ankle Abnormal FMC Reflexes 3+, Reflexes: 3+, Tone:  Hypertonia, Inattention and Other L homonymous hemianopsia Musc/Skel:  Normal Better visuospatial awareness on the left. Much more clear and focused this am with thought processing Sensation absent L arm and leg Assessment/Plan: 1. Functional deficits secondary to R thalamic bleed which require 3+ hours per day of interdisciplinary therapy in a comprehensive inpatient rehab setting. Physiatrist is providing close team supervision and 24 hour management of active medical problems listed below. Physiatrist and rehab team continue to assess barriers to discharge/monitor patient progress toward functional and medical goals. FIM: FIM - Bathing Bathing Steps Patient Completed:  Chest;Left Arm;Abdomen;Front perineal area;Right upper leg;Left upper leg Bathing: 3: Mod-Patient completes 5-7 64f 10 parts or 50-74%  FIM - Upper Body Dressing/Undressing Upper body dressing/undressing steps patient completed: Put head through opening of pull over shirt/dress;Pull shirt over trunk Upper body dressing/undressing: 2: Max-Patient completed 25-49% of tasks FIM - Lower Body Dressing/Undressing Lower body dressing/undressing steps patient completed: Pull underwear up/down;Pull pants up/down;Don/Doff right sock Lower body dressing/undressing: 2: Max-Patient completed 25-49% of tasks  FIM - Toileting Toileting: 1: Two helpers  FIM - Diplomatic Services operational officer Devices: Therapist, music Transfers: 4-To toilet/BSC: Min A (steadying Pt. > 75%)  FIM - Bed/Chair Transfer Bed/Chair Transfer Assistive Devices: Bed rails Bed/Chair Transfer: 4: Bed > Chair or W/C: Min A (steadying Pt. > 75%);3: Chair or W/C > Bed: Mod A (lift or lower assist)  FIM - Locomotion: Wheelchair Locomotion: Wheelchair: 2: Travels 50 - 149 ft with moderate assistance (Pt: 50 - 74%) FIM - Locomotion: Ambulation Locomotion: Ambulation Assistive Devices: Other (comment) (Bilateral HHA) Ambulation/Gait Assistance: 1: +2 Total assist Locomotion: Ambulation: 0: Activity did not occur  Comprehension Comprehension Mode: Auditory Comprehension: 3-Understands basic 50 - 74% of the time/requires cueing 25 - 50%  of the time  Expression Expression Mode: Verbal Expression: 5-Expresses basic needs/ideas: With extra time/assistive device  Social Interaction Social Interaction: 5-Interacts appropriately 90% of the time - Needs monitoring or encouragement for participation or interaction.  Problem Solving Problem Solving: 2-Solves basic 25 - 49% of the time - needs direction more than half the time to initiate, plan or complete simple activities  Memory Memory: 3-Recognizes or recalls 50 - 74% of  the time/requires cueing  25 - 49% of the time  2. Anticoagulation/DVT prophylaxis with non-Pharmaceutical:  Medical Problem List and Plan:  1. 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 DVT Prophylaxis/Anticoagulation: Subcutaneous Lovenox initiated 10/31/2011. Monitor platelet counts and any signs of bleeding  3. Mood: Continue Ritalin, now is on bid. Check sleep chart  4. Neuropsych: This patient is not capable of making decisions on his/her own behalf at this time.He has very poor awareness of deficits and grossly overestimates his physical and cognitive abilities  5. Hypertension. Norvasc 10 mg daily, clonidine patch 0.1 mg weekly, hydrochlorothiazide 25 mg daily, lisinopril 20 mg a.m. and 10 mg at bedtime. Monitor with increased mobility  6. Dysphagia. Mechanical soft diet thin liquids. Speech therapy followup. Monitor for any signs of aspiration.     LOS (Days) 8 A FACE TO FACE EVALUATION WAS PERFORMED  KIRSTEINS,ANDREW E 11/22/2011, 8:14 AM

## 2011-11-22 NOTE — Progress Notes (Signed)
Occupational Therapy Weekly Progress Note and Treatment Note  Patient Details  Name: Anthony Hardin MRN: [id number] Date of Birth: [birth year]  Today's Date: 11/22/2011 Time: 4098-1191 Time Calculation (min): 58 min  Patient has met 3 of 4 short term goals.  Pt is making steady progress towards goals. He has shown great progress with self-care tasks progressing from total assist with bathing and dressing to mod assist overall with max assist LB dressing.  Pt is able to attend to Lt body for increased periods of time with min-mod cues.  Pt continues to require verbal, visual, and tactile cues for initiation and sequencing with self-care tasks.  Pt has shown progress in transfers to mod assist overall.    Patient continues to demonstrate the following deficits: decreased activity tolerance/endurance, Lt inattention, decreased proprioception and sensation in LUE and LLE, impaired motor planning and motor control, impaired memory, decreased postural control, and decreased dynamic sitting and standing balance and therefore will continue to benefit from skilled OT intervention to enhance overall performance with BADL and Reduce care partner burden.  Patient progressing toward long term goals..  Continue plan of care.  OT Short Term Goals Week 1:  OT Short Term Goal 1 (Week 1): Pt will perform grooming activities seated at sink w/ Mod Assist OT Short Term Goal 1 - Progress (Week 1): Progressing toward goal OT Short Term Goal 2 (Week 1): Pt will perform UB bathing with Mod assist and  VC/TC's while seated at sink OT Short Term Goal 2 - Progress (Week 1): Met OT Short Term Goal 3 (Week 1): Pt will perform UB dressing/Hemi techniques seated with Mod Assist OT Short Term Goal 3 - Progress (Week 1): Met OT Short Term Goal 4 (Week 1): Pt will demonstrate task initiation for ADL/selfcare tasks on 1 out 3 occassions with verbal, visual or tactile cues OT Short Term Goal 4 - Progress (Week 1): Met Week 2:   OT Short Term Goal 1 (Week 2): Pt will perform grooming activities seated at sink w/ Mod Assist OT Short Term Goal 2 (Week 2): Pt will complete UB dressing with min assist with hemi-technique OT Short Term Goal 3 (Week 2): Pt will complete LB dressing with mod assist OT Short Term Goal 4 (Week 2): Pt will complete toilet transfer with mod assist in 2 out of 3 attempts  Skilled Therapeutic Interventions/Progress Updates:  Pt seen for ADL retraining at walk-in shower level. Pt required demonstrating increased initiation with bathing and dressing tasks, with increased sequencing with UB bathing with cues to initiate first task and pt was able to complete UB bathing with only min question cues. Hand over hand assist at LUE to complete washing RUE.  Cues for pt to visually attend to Lt hand with all movement.  Pt required increased cues with LB bathing. Pt demonstrating increased independence with sit to stand with min assist post alignment of LLE. Pt with increased carryover of bathing technique at shower level. Pt continues to demonstrate decreased/delayed initiation of all tasks due to decreased cognitive processing and occasionally attempting to over think movements. Pt continues to require increased cues with dressing secondary to decreased attention to task, motor planning, and problem solving.  Therapy Documentation Precautions:  Precautions Precautions: Fall Precaution Comments: Lt hemiparesis and sensory deficits and Lt visual field neglect Restrictions Weight Bearing Restrictions: No General:   Vital Signs: Therapy Vitals Pulse Rate: 68  Resp: 18  BP: 112/72 mmHg Patient Position, if appropriate: Sitting Pain: Pain Assessment Pain  Assessment: No/denies pain Pain Score: 0-No pain ADL: ADL Eating: Maximal cueing;Minimal assistance Where Assessed-Eating: Chair Grooming: Maximal assistance Where Assessed-Grooming: Sitting at sink;Wheelchair Upper Body Bathing: Minimal  assistance;Moderate cueing Where Assessed-Upper Body Bathing: Shower Lower Body Bathing: Maximal assistance Where Assessed-Lower Body Bathing: Shower Upper Body Dressing: Moderate assistance Where Assessed-Upper Body Dressing: Sitting at sink Lower Body Dressing: Maximal assistance Where Assessed-Lower Body Dressing: Sitting at sink Toileting: Maximal assistance Toilet Transfer: Moderate assistance Toilet Transfer Method: Stand pivot Tub/Shower Transfer: Not assessed Film/video editor: Moderate assistance Film/video editor Method: Warden/ranger: Transfer tub bench;Grab bars ADL Comments: Pt continues to have severe Lt neglect and sensory loss  See FIM for current functional status  Therapy/Group: Individual Therapy  Leonette Monarch 11/22/2011, 11:21 AM

## 2011-11-22 NOTE — Progress Notes (Signed)
Nutrition Follow-up  Intervention:   1. Decrease Resource Breeze to BID 2. Offer appropriate alternatives it pt refuses meals 3. RD to continue to follow nutrition care plan  Assessment:   Intake has improved since last RD visit, pt is consuming 75-100% of meals. SLP confirms that attention and intake has continued to improve. Continues on Dysphagia 3 diet order with thin liquids. Family providing some additional foods per nurse tech.  Diet Order:  Dysphagia 3 with thins Supplement: Ensure Complete PO BID  Meds: Scheduled Meds:    . amLODipine  10 mg Oral Daily  . antiseptic oral rinse  15 mL Mouth Rinse BID  . cloNIDine  0.1 mg Transdermal Weekly  . enoxaparin (LOVENOX) injection  40 mg Subcutaneous Q24H  . feeding supplement  1 Container Oral TID BM  . hydrochlorothiazide  25 mg Oral Daily  . lisinopril  10 mg Oral QHS  . lisinopril  20 mg Oral Daily  . methylphenidate  5 mg Oral BID WC  . multivitamin with minerals  1 tablet Oral Daily  . pantoprazole  20 mg Oral Q1200  . potassium chloride  10 mEq Oral Daily  . senna-docusate  1 tablet Oral BID   Continuous Infusions:  PRN Meds:.acetaminophen, bisacodyl, ondansetron (ZOFRAN) IV, ondansetron, polyethylene glycol, sorbitol  Labs:  CMP     Component Value Date/Time   NA 135 11/15/2011 0625   K 3.9 11/15/2011 0625   CL 96 11/15/2011 0625   CO2 28 11/15/2011 0625   GLUCOSE 103* 11/15/2011 0625   BUN 30* 11/15/2011 0625   CREATININE 1.19 11/15/2011 0625   CALCIUM 10.4 11/15/2011 0625   PROT 7.0 11/15/2011 0625   ALBUMIN 3.5 11/15/2011 0625   AST 18 11/15/2011 0625   ALT 38 11/15/2011 0625   ALKPHOS 58 11/15/2011 0625   BILITOT 0.5 11/15/2011 0625   GFRNONAA 65* 11/15/2011 0625   GFRAA 76* 11/15/2011 0625     Intake/Output Summary (Last 24 hours) at 11/22/11 1230 Last data filed at 11/22/11 0900  Gross per 24 hour  Intake    360 ml  Output    625 ml  Net   -265 ml  BM 9/7  Weight Status:  166 lb - no new wt  Re-estimated needs:  1900 -  2000 kcal, 90 - 105 grams protein  Nutrition Dx:  Inadequate oral intake r/t decreased alertness AEB poor meal completion (<25%.) Resolved.  Goal: Pt to meet >/= 90% of their estimated nutrition needs;  met  Monitor: weight trends, lab trends, I/O's, PO intake, supplement tolerance  Jarold Motto MS, RD, LDN Pager: [phone] After-hours pager: [phone]

## 2011-11-22 NOTE — Progress Notes (Signed)
Speech Language Pathology Daily Session Note  Patient Details  Name: Anthony Hardin MRN: [id number] Date of Birth: 1950/09/11  Today's Date: 11/22/2011 Time: 4098-1191 Time Calculation (min): 60 min  Short Term Goals: Week 1: SLP Short Term Goal 1 (Week 1): Patient will sustain attention to a basic familiar task for 2 minutes with mod multimodal cues.   SLP Short Term Goal 2 (Week 1): Patient will demonstrate emergent awareness by asking for help when needed with max assist verbal cues.   SLP Short Term Goal 3 (Week 1): Patient will solve basic familiar problems with max assist multimodal cues.   SLP Short Term Goal 4 (Week 1): Patient will consume dys. 3 textures and thin liquids with min assist verbal cues with no overt s/s of aspiration.    Skilled Therapeutic Interventions: Treatment session focused on addressing cognitive goals during a basic, familiar task. SLP facilitated session with self-feeding task with mod assist verbal cues and min assist tactile cues to sequence and problem solve set up and switch between items/tasks during meal. Patient required supervision level verbal cues to follow safe swallow compensatory strategies and displayed no overt s/s of aspiration. Patient attended to tasks for about 1 minute increments throughout session.  Patient also exhibited two instances of motor apraxia where he appeared to "freeze" while doing an activity and required repetitive multimodal cuing to continue completing the activity.  SLP also facilitated the session with min assist verbal cuing to initiate a reading activity.  While reading, the SLP facilitated the session with supervision cues to complete the reading passage with appropriate scanning to the left.     FIM:  Comprehension Comprehension Mode: Auditory Comprehension: 3-Understands basic 50 - 74% of the time/requires cueing 25 - 50%  of the time Expression Expression Mode: Verbal Expression: 5-Expresses basic needs/ideas:  With extra time/assistive device Social Interaction Social Interaction: 5-Interacts appropriately 90% of the time - Needs monitoring or encouragement for participation or interaction. Problem Solving Problem Solving: 2-Solves basic 25 - 49% of the time - needs direction more than half the time to initiate, plan or complete simple activities Memory Memory: 3-Recognizes or recalls 50 - 74% of the time/requires cueing 25 - 49% of the time FIM - Eating Eating Activity: 5: Supervision/cues;5: Set-up assist for open containers  Pain Pain Assessment Pain Assessment: No/denies pain Pain Score: 0-No pain  Therapy/Group: Individual Therapy  [name], Conrad [place]  Graduate Clinician Speech Language Pathology   Page, [name] 11/22/2011, 1:21 PM  The above skilled treatment note has been reviewed and SLP is in agreement. [name], M.A., CCC-SLP [phone]

## 2011-11-22 NOTE — Progress Notes (Signed)
Speech Language Pathology Weekly Progress Note  Patient Details  Name: IVY MERIWETHER MRN: [id number] Date of Birth: [birth year]  Today's Date: 11/22/2011   Short Term Goals: Week 1: SLP Short Term Goal 1 (Week 1): Patient will sustain attention to a basic familiar task for 2 minutes with mod multimodal cues.   SLP Short Term Goal 1 - Progress (Week 1): Progressing toward goal SLP Short Term Goal 2 (Week 1): Patient will demonstrate emergent awareness by asking for help when needed with max assist verbal cues.   SLP Short Term Goal 2 - Progress (Week 1): Progressing toward goal SLP Short Term Goal 3 (Week 1): Patient will solve basic familiar problems with max assist multimodal cues.   SLP Short Term Goal 3 - Progress (Week 1): Met SLP Short Term Goal 4 (Week 1): Patient will consume dys. 3 textures and thin liquids with min assist verbal cues with no overt s/s of aspiration.   SLP Short Term Goal 4 - Progress (Week 1): Met  Weekly Progress Updates: Patient met 2 out of 4 short term goals for this reporting period with gains in dysphagia management and problem solving.  Patient is progressing toward meeting attention and awareness goals.  Patient recognizes his impairments but exhibits deficits in emergent awareness as evidenced by his lack of asking for help when appropriate with max assist cues.  Patient has also exhibited increased sustained attention from 30 seconds to approximately 1 minute.  Patient exhibited improved problem solving with max assist multimodal cues and continues to tolerate Dys. 3 textures and thin liquids.  SLP will complete trials of regular textures for next reporting period.  Patient would continue to benefit from intensive speech therapy to maximize functional independence and safety with basic ADLs and reduce burden of care upon discharge.    SLP Frequency: 1-2 X/day, 30-60 minutes;5 out of 7 days Estimated Length of Stay: 4 weeks SLP Treatment/Interventions:  Cognitive remediation/compensation;Cueing hierarchy;Speech/Language facilitation;Oral motor exercises;Patient/family education;Environmental controls;Multimodal communication approach;Therapeutic Activities;Functional tasks;Internal/external aids  Daily Session FIM:  Comprehension Comprehension Mode: Auditory Comprehension: 3-Understands basic 50 - 74% of the time/requires cueing 25 - 50%  of the time Expression Expression Mode: Verbal Expression: 5-Expresses basic needs/ideas: With extra time/assistive device Social Interaction Social Interaction: 5-Interacts appropriately 90% of the time - Needs monitoring or encouragement for participation or interaction. Problem Solving Problem Solving: 2-Solves basic 25 - 49% of the time - needs direction more than half the time to initiate, plan or complete simple activities Memory Memory: 3-Recognizes or recalls 50 - 74% of the time/requires cueing 25 - 49% of the time FIM - Eating Eating Activity: 5: Supervision/cues;5: Set-up assist for open containers  [name], Conrad [place]  Graduate Clinician Speech Language Pathology  Page, [name] 11/22/2011, 5:00 PM  The above skilled treatment note has been reviewed and SLP is in agreement. [name], M.A., CCC-SLP [phone]

## 2011-11-22 NOTE — Patient Care Conference (Signed)
Inpatient RehabilitationTeam Conference Note Date: 11/22/2011   Time: 11:00 AM    Patient Name: Anthony Hardin      Medical Record Number: [id number]  Date of Birth: 08-09-1950 Sex: Male         Room/Bed: 4025/4025-01 Payor Info: Payor: BLUE CROSS BLUE SHIELD  Plan: [facility] HEALTH PPO  Product Type: *No Product type*     Admitting Diagnosis: RT HEMM   Admit Date/Time:  11/14/2011  5:35 PM Admission Comments: No comment available   Primary Diagnosis:  Thalamic hemorrhage Principal Problem: Thalamic hemorrhage  Patient Active Problem List   Diagnosis Date Noted  . 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 Intracerebral hemorrhage 10/31/2011  . 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 Hemiplegia, unspecified, affecting nondominant side 10/31/2011    Expected Discharge Date: Expected Discharge Date: 12/12/11  Team Members Present: Physician: Dr. 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 3 textures and thin liquids  regular textures and thin liquids  increase sustained attention and regular texture trials.     ADL's   mod assist bathing and UB dsg, max assist LB dsg, mod assist transfers, mod assist grooming  min assist overall  increase sustained attention, transfers, Lt attention   Mobility   min-mod w/c mobility, mod-max transfers, +2 A gait  supervision-min A  overall  attention to L, sequencing and safety with transfers, gait   Communication             Safety/Cognition/ Behavioral Observations  max assist  supervision- min assist  increase selective attention,  awareness, self monitoring and self correcting   Pain   n/a  n/a  n/a   Skin   n/a  n/a  n/a      *See Interdisciplinary Assessment and Plan and progress notes for long and short-term goals  Barriers to Discharge: No 24 7 caregiver    Possible Resolutions to Barriers:  Question SNF versus hired help    Discharge Planning/Teaching Needs:  Home versus NHP.  Brother and sister involved and have been here. Pursuing options      Team Discussion:  Pt is making good progress, more alert with the ritalin. No sensation on his left more awareness and increased attention.  Better with his sequencing.  Revisions to Treatment Plan:  None   Continued Need for Acute Rehabilitation Level of Care: The patient requires daily medical management by a physician with specialized training in physical medicine and rehabilitation for the following conditions: Daily direction of a multidisciplinary physical rehabilitation program to ensure safe treatment while eliciting the highest outcome that is of practical value to the patient.: Yes Daily medical management of patient stability for increased activity during participation in an intensive rehabilitation regime.: Yes Daily analysis of laboratory values and/or radiology reports with any subsequent need for medication adjustment of medical intervention for : Neurological problems  Aniyah Nobis, [name] 11/22/2011, 12:59 PM

## 2011-11-22 NOTE — Progress Notes (Signed)
Social Work Patient ID: Anthony Hardin, male   DOB: [birth year], 61 y.o.   MRN: [id number] Conference call with brother and sister and met with pt to inform of team conference goals-min level and discharge 10/1. Both encouraged by his progress in just a short time.  Pt also commented on his progress.  Continue to pursue alternatives And what is avaliable in their areas.  Continue to work on discharge plan.  Also updated insurance today.

## 2011-11-22 NOTE — Progress Notes (Signed)
Physical Therapy Session Note  Patient Details  Name: Anthony Hardin MRN: [id number] Date of Birth: January 30, 1951  Today's Date: 11/22/2011 Time: 1130-1202 and 4098-1191 Time Calculation (min): 32 min and 45 min  Short Term Goals: Week 1:  PT Short Term Goal 1 (Week 1): pt will be able to SPT with ModA.   PT Short Term Goal 1 - Progress (Week 1): Met PT Short Term Goal 2 (Week 1): pt will perform bed mobility with rail with MinA.   PT Short Term Goal 2 - Progress (Week 1): Met PT Short Term Goal 3 (Week 1): pt will be able to stand during a functional task with MinA.   PT Short Term Goal 3 - Progress (Week 1): Progressing toward goal PT Short Term Goal 4 (Week 1): pt will ambulate with with Max A 20'.   PT Short Term Goal 4 - Progress (Week 1): Progressing toward goal Week 2:  PT Short Term Goal 1 (Week 2): Patient will perform bed mobility on flat bed min A to L and R PT Short Term Goal 2 (Week 2): Patient will perform functional transfers to L and R with min A and mod-max verbal cues for initiation, sequence and to attend to L PT Short Term Goal 3 (Week 2): Patient will perform w/c mobility on unit x 150 with mod A and mod-max verbal and visual cues to attend to L environment PT Short Term Goal 4 (Week 2): Patient will perform gait training on unit x 75' with max A of one person with increased LLE advancement and activation of LLE in stance.  Skilled Therapeutic Interventions/Progress Updates:   Performed w/c parts management and w/c mobility training on unit with use of task to focus on L visual scanning, attention to L environment, initiation, sequencing, and intellectual awareness in controlled environment with planned obstacles.  Patient continued to require total verbal, visual and questioning cues for all aspects of w/c parts management, propulsion sequence and obstacle negotiation.  PM session: patient performed sit > supine EOB with min A and verbal cues to attend to LLE and use of  RUE to advance LLE to EOB.  Performed transfer to w/c with max A stand pivot with increased assistance for R lateral weight shift in order to advance LLE; standing is more automatic for patient but feel that squat pivoting will be safer until patient better able to advance LLE.  Transfer training on mat with focus on coming into squat position and maintaining squat with activation of LLE reaching RUE for target forward and to the R to place sticky notes on mirror.  Carry over of squat to squat scoots to L and R along mat with use of colored dots as visual target for buttocks placement.  Carry over of squat pivot technique to squat pivots mat <> chair beginning with max A for hand placement, sit > squat and for full pivot with total step by step cues progressing to min A and moderate cues for initiation and sequencing to L and R.  NMR in standing for forced use of LLE, motor control, timing, sequencing and sustained activation of LLE extensors for R lateral weight shifting reaching to R to remove sticky notes from bottom of mirror and placing them at top of mirror with mod-max A for LLE activation and weight shift without pivoting R foot towards mirror.  Patient very fatigued at end of session.  Therapy Documentation Precautions:  Precautions Precautions: Fall Precaution Comments: Lt hemiparesis and sensory deficits  and Lt visual field neglect Restrictions Weight Bearing Restrictions: No Vital Signs: Therapy Vitals Pulse Rate: 68  Resp: 18  BP: 112/72 mmHg Patient Position, if appropriate: Sitting Pain: Pain Assessment Pain Assessment: No/denies pain Pain Score: 0-No pain  See FIM for current functional status  Therapy/Group: Individual Therapy  [name] [facility] 11/22/2011, 12:07 PM

## 2011-11-23 ENCOUNTER — Inpatient Hospital Stay (HOSPITAL_COMMUNITY): Payer: BC Managed Care – PPO

## 2011-11-23 ENCOUNTER — Inpatient Hospital Stay (HOSPITAL_COMMUNITY): Payer: BC Managed Care – PPO | Admitting: Occupational Therapy

## 2011-11-23 ENCOUNTER — Inpatient Hospital Stay (HOSPITAL_COMMUNITY): Payer: BC Managed Care – PPO | Admitting: *Deleted

## 2011-11-23 ENCOUNTER — Inpatient Hospital Stay (HOSPITAL_COMMUNITY): Payer: BC Managed Care – PPO | Admitting: Speech Pathology

## 2011-11-23 DIAGNOSIS — I6789 Other cerebrovascular disease: Secondary | ICD-10-CM

## 2011-11-23 NOTE — Progress Notes (Signed)
Speech Language Pathology Daily Session Note  Patient Details  Name: Anthony Hardin MRN: [id number] Date of Birth: [birth year]  Today's Date: 11/23/2011 Time: 3086-5784 Time Calculation (min): 60 min  Short Term Goals: Week 2: SLP Short Term Goal 1 (Week 2): Patient will sustain attention to a basic familiar task for 2 minutes with mod multimodal cues.  SLP Short Term Goal 2 (Week 2): Patient will demonstrate emergent awareness by asking for help when needed with max assist verbal cues.  SLP Short Term Goal 3 (Week 2): Patient will solve basic familiar problems with mod assist multimodal cues. SLP Short Term Goal 4 (Week 2): Patient will consume dys. 3 textures and thin liquids with supervision cues with no overt s/s of aspiration. SLP Short Term Goal 5 (Week 2): Patient will tolerate trials of regular textures with min assist to utilize compensatory swallowing strategies with no overt s/s of aspiration  Skilled Therapeutic Interventions: Treatment session focused on addressing cognitive goals during various tasks. SLP facilitated session with mild environmental distractions and a sorting task.  Patient demonstrated improved attention to task for 2 minutes.  Patient required mod assist verbal and visual cue sequence and organize tasks.  Patient required min assist verbal cues to follow safe swallow compensatory strategies during various other tasks and demonstrated an intermittent cough due to talking with thin liquid in mouth.    FIM:  Comprehension Comprehension Mode: Auditory Comprehension: 3-Understands basic 50 - 74% of the time/requires cueing 25 - 50%  of the time Expression Expression Mode: Verbal Expression: 5-Expresses basic needs/ideas: With extra time/assistive device Social Interaction Social Interaction: 5-Interacts appropriately 90% of the time - Needs monitoring or encouragement for participation or interaction. Problem Solving Problem Solving: 2-Solves basic 25 - 49% of  the time - needs direction more than half the time to initiate, plan or complete simple activities Memory Memory: 3-Recognizes or recalls 50 - 74% of the time/requires cueing 25 - 49% of the time FIM - Eating Eating Activity: 5: Supervision/cues;5: Set-up assist for open containers  Pain Pain Assessment Pain Assessment: No/denies pain  Therapy/Group: Individual Therapy  Charlane Ferretti., CCC-SLP [phone]  Dewey Viens 11/23/2011, 1:20 PM

## 2011-11-23 NOTE — Progress Notes (Signed)
Physical Therapy Session Note  Patient Details  Name: Anthony Hardin MRN: [id number] Date of Birth: May 22, 1950  Today's Date: 11/23/2011 1:2 Time: 4098-1191 Time Calculation (min): 35 min  2:2 Time:13:27-14:00 Time Calculation (min): 33 min  Short Term Goals: Week 2:  PT Short Term Goal 1 (Week 2): Patient will perform bed mobility on flat bed min A to L and R PT Short Term Goal 2 (Week 2): Patient will perform functional transfers to L and R with min A and mod-max verbal cues for initiation, sequence and to attend to L PT Short Term Goal 3 (Week 2): Patient will perform w/c mobility on unit x 150 with mod A and mod-max verbal and visual cues to attend to L environment PT Short Term Goal 4 (Week 2): Patient will perform gait training on unit x 75' with max A of one person with increased LLE advancement and activation of LLE in stance.  Skilled Therapeutic Interventions/Progress Updates:     AM tx: Pt with no c/o pain, except for new burning sensation in LUE. Doctor aware and pt wanting to avoid meds. Pt reporting increased activation in LUE and LE, encouraged to continue active motion throughout day.   Tx focused on [facility] management and propulsion, transfer training, and gait training with L PFRW.   Pt encouraged to problem solve through safety and sequence for preparing WC for leaving his room. Pt needing verbal, visual, and tactile cues to complete all steps, esp on L.  Pt propelled WC room>gym with Mod A for safe navigation of halls, turns, and obstacles. Pt challenged to read signs on L side during locomotion to gym, using auditory cues to locate signs on L (tapping).   Reviewed steps and technique for squat-pivot transfer WC>mat with mod A for safe turning to complete transfer. Pt continues to attempt standing for transfer.   Gait training with L platform and then L hand grip RW with max A 1x30' and 1x32.' Pt needing assistance for advancing RW, R weight shift, partial L LE  advancement, and steadying to prevent L LOB. Pt unable to verbalize gait pattern, but does well when not over-thinking task.   PM tx:  Sit<>stand with Min A, pt able to verbalize safe hand placement.  Gait training 1x25' with PFRW and +2 Total assist needed. Pt seeming to be distracted/over thinking with decreased coordination this tx with similar assist needed as above, but with increased L/posterior leaning.   Squat-pivot transfers x2WC<>mat with Mod A and cues for technique.  Reviewed L brake location and operation 5 times in a row to increase carryover, with some success.   NRM for LLE in standing at mirror for balance assistance.  Pt able to kick L foot to target, but no lift to 2" step x10.  Pt able to step-tap with RLE to 2" step, but Max A for balance due to posterior leaning, stabilizing L knee.  Pt with decreased balance with fatigue and new tasks. Does better with balance when corrects in mirror.   Therapy Documentation Precautions:  Precautions Precautions: Fall Precaution Comments: Lt hemiparesis and sensory deficits and Lt visual field neglect Restrictions Weight Bearing Restrictions: No     See FIM for current functional status  Therapy/Group: Individual Therapy  Virl Cagey 11/23/2011, 11:33 AM

## 2011-11-23 NOTE — Progress Notes (Signed)
Patient ID: Anthony Hardin, male   DOB: 09-05-1950, 61 y.o.   MRN: [id number] Admitted 10/31/2011 with diffuse weakness and unable to move and fell out of bed onto the floor. MRI of the brain showed a 4 x 1 x 2.5 cm acute hematoma right thalamus with mass effect and displacement distortion of the third ventricle but no suspicion of obstructive hydrocephalus. MRA of the head with no stenosis or occlusion.   Subjective/Complaints: Some burning sensation on L side Not debilitating No other c/os  Discussed BP control    Review of Systems  Musculoskeletal: Negative for myalgias.  Neurological: Positive for sensory change and focal weakness.  All other systems reviewed and are negative.   Objective: Vital Signs: Blood pressure 126/77, pulse 60, temperature 98.3 F (36.8 C), temperature source Oral, resp. rate 19, height 5\' 9"  (1.753 m), weight 70.7 kg (155 lb 13.8 oz), SpO2 97.00%. No results found. No results found for this or any previous visit (from the past 72 hour(s)).   HEENT: normal Cardio: RRR Resp: CTA B/L GI: BS positive Extremity:  No Edema Skin:   Intact Neuro: Confused, Abnormal Sensory absent sensation L side lt touch and proprio, Abnormal Motor 2/5 finger flex and arm ext, 2/5 hip/knee extensory synergy,0/5 L Ankle Abnormal FMC Reflexes 3+, Reflexes: 3+, Tone:  Hypertonia, Inattention and Other L homonymous hemianopsia Musc/Skel:  Normal Better visuospatial awareness on the left. Much more clear and focused this am with thought processing Sensation absent L arm and leg Assessment/Plan: 1. Functional deficits secondary to R thalamic bleed which require 3+ hours per day of interdisciplinary therapy in a comprehensive inpatient rehab setting. Physiatrist is providing close team supervision and 24 hour management of active medical problems listed below. Physiatrist and rehab team continue to assess barriers to discharge/monitor patient progress toward functional and medical  goals. FIM: FIM - Bathing Bathing Steps Patient Completed: Chest;Left Arm;Abdomen;Front perineal area;Right upper leg;Left upper leg Bathing: 3: Mod-Patient completes 5-7 84f 10 parts or 50-74%  FIM - Upper Body Dressing/Undressing Upper body dressing/undressing steps patient completed: Put head through opening of pull over shirt/dress;Pull shirt over trunk Upper body dressing/undressing: 3: Mod-Patient completed 50-74% of tasks FIM - Lower Body Dressing/Undressing Lower body dressing/undressing steps patient completed: Pull underwear up/down;Pull pants up/down Lower body dressing/undressing: 2: Max-Patient completed 25-49% of tasks  FIM - Toileting Toileting: 1: Two helpers  FIM - Diplomatic Services operational officer Devices: Therapist, music Transfers: 4-To toilet/BSC: Min A (steadying Pt. > 75%)  FIM - Bed/Chair Transfer Bed/Chair Transfer Assistive Devices: Bed rails Bed/Chair Transfer: 4: Supine > Sit: Min A (steadying Pt. > 75%/lift 1 leg);2: Bed > Chair or W/C: Max A (lift and lower assist);3: Chair or W/C > Bed: Mod A (lift or lower assist)  FIM - Locomotion: Wheelchair Locomotion: Wheelchair: 3: Travels 150 ft or more: maneuvers on rugs and over door sills with moderate assistance  (Pt: 50 - 74%) FIM - Locomotion: Ambulation Locomotion: Ambulation Assistive Devices: Other (comment) (Bilateral HHA) Ambulation/Gait Assistance: 1: +2 Total assist Locomotion: Ambulation: 0: Activity did not occur  Comprehension Comprehension Mode: Auditory Comprehension: 3-Understands basic 50 - 74% of the time/requires cueing 25 - 50%  of the time  Expression Expression Mode: Verbal Expression: 5-Expresses basic needs/ideas: With extra time/assistive device  Social Interaction Social Interaction: 5-Interacts appropriately 90% of the time - Needs monitoring or encouragement for participation or interaction.  Problem Solving Problem Solving: 2-Solves basic 25 - 49% of the time -  needs direction  more than half the time to initiate, plan or complete simple activities  Memory Memory: 3-Recognizes or recalls 50 - 74% of the time/requires cueing 25 - 49% of the time  2. Anticoagulation/DVT prophylaxis with non-Pharmaceutical:  Medical Problem List and Plan:  1. 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 DVT Prophylaxis/Anticoagulation: Subcutaneous Lovenox initiated 10/31/2011. Monitor platelet counts and any signs of bleeding  3. Mood: Continue Ritalin, now is on bid. Check sleep chart  4. Neuropsych: This patient is not capable of making decisions on his/her own behalf at this time.He has very poor awareness of deficits and grossly overestimates his physical and cognitive abilities  5. Hypertension. Norvasc 10 mg daily, clonidine patch 0.1 mg weekly, hydrochlorothiazide 25 mg daily, lisinopril 20 mg a.m. and 10 mg at bedtime. Monitor with increased mobility  6. Dysphagia. Mechanical soft diet thin liquids. Speech therapy followup. Monitor for any signs of aspiration.     LOS (Days) 9 A FACE TO FACE EVALUATION WAS PERFORMED  Shakeema Lippman E 11/23/2011, 8:58 AM

## 2011-11-23 NOTE — Progress Notes (Signed)
Patient was in room with a friend of his who is an MD. He is a friend of the MD, not a patient of his, per both of them. RN attempted to give patient 1200 dose of Protonix 20 mg and Ritalin 5mg . RN educated patient on indications and rationale of medication. Patient's friend talked to patient about Protonix, advising him that his risks for symptoms indicative of taking Protonix were not substantial enough for patient to be on it. Patient refused the Protonix after the discussion but still took the Ritalin. Deatra Ina, PA, notified. Patient then alerted RN at dinnertime that he no longer wanted to take "the acid reflux pill". RN alerted Dr. Wynn Banker, who then discontinued the order for Protonix. Hedy Camara

## 2011-11-23 NOTE — Progress Notes (Signed)
Occupational Therapy Session Note  Patient Details  Name: Anthony Hardin MRN: [id number] Date of Birth: Feb 05, 1951  Today's Date: 11/23/2011 Time: 1478-2956 Time Calculation (min): 57 min  Short Term Goals: Week 2:  OT Short Term Goal 1 (Week 2): Pt will perform grooming activities seated at sink w/ Mod Assist OT Short Term Goal 2 (Week 2): Pt will complete UB dressing with min assist with hemi-technique OT Short Term Goal 3 (Week 2): Pt will complete LB dressing with mod assist OT Short Term Goal 4 (Week 2): Pt will complete toilet transfer with mod assist in 2 out of 3 attempts  Skilled Therapeutic Interventions/Progress Updates:    Pt seen for ADL retraining with focus on safe transfers, toileting, dressing, and grooming tasks.  Upon arrival, pt reported needing to have BM, transfer from bed > w/c > toilet with mod-max assist with stand pivot requiring physical assist to advance LLE.  Pt required increased time to complete BM.  While toileting engaged in doffing pants and scanning to Lt to obtain items for hygiene.  Pt requested to skip shower this session and focus on dressing and grooming.  Engaged in brushing teeth at sink with focus on scanning to Lt to obtain items, using BUE to open toothpaste, and sequencing steps of toothbrushing with mod cues.  Pt continues to have perceptual difficulties with donning shirt and requires setup and max cues for donning shirt on Lt and Rt arm.    Therapy Documentation Precautions:  Precautions Precautions: Fall Precaution Comments: Lt hemiparesis and sensory deficits and Lt visual field neglect Restrictions Weight Bearing Restrictions: No General:   Vital Signs: Therapy Vitals Temp: 98.3 F (36.8 C) Temp src: Oral Pulse Rate: 60  Resp: 19  BP: 126/77 mmHg Patient Position, if appropriate: Lying Oxygen Therapy SpO2: 97 % O2 Device: None (Room air) Pain:   Pt with no c/o pain this session.  See FIM for current functional  status  Therapy/Group: Individual Therapy  Leonette Monarch 11/23/2011, 10:08 AM

## 2011-11-24 ENCOUNTER — Inpatient Hospital Stay (HOSPITAL_COMMUNITY): Payer: BC Managed Care – PPO | Admitting: Occupational Therapy

## 2011-11-24 ENCOUNTER — Inpatient Hospital Stay (HOSPITAL_COMMUNITY): Payer: BC Managed Care – PPO | Admitting: Speech Pathology

## 2011-11-24 ENCOUNTER — Inpatient Hospital Stay (HOSPITAL_COMMUNITY): Payer: BC Managed Care – PPO | Admitting: Physical Therapy

## 2011-11-24 DIAGNOSIS — Z5189 Encounter for other specified aftercare: Secondary | ICD-10-CM

## 2011-11-24 DIAGNOSIS — G8110 Spastic hemiplegia affecting unspecified side: Secondary | ICD-10-CM

## 2011-11-24 DIAGNOSIS — I619 Nontraumatic intracerebral hemorrhage, unspecified: Secondary | ICD-10-CM

## 2011-11-24 NOTE — Progress Notes (Signed)
Speech Language Pathology Daily Session Note  Patient Details  Name: Anthony Hardin MRN: [id number] Date of Birth: 1951-02-02  Today's Date: 11/24/2011 Time: 1200-1210 Time Calculation (min): 10 min  Short Term Goals: Week 2: SLP Short Term Goal 1 (Week 2): Patient will sustain attention to a basic familiar task for 2 minutes with mod multimodal cues.  SLP Short Term Goal 2 (Week 2): Patient will demonstrate emergent awareness by asking for help when needed with max assist verbal cues.  SLP Short Term Goal 3 (Week 2): Patient will solve basic familiar problems with mod assist multimodal cues. SLP Short Term Goal 4 (Week 2): Patient will consume dys. 3 textures and thin liquids with supervision cues with no overt s/s of aspiration. SLP Short Term Goal 5 (Week 2): Patient will tolerate trials of regular textures with min assist to utilize compensatory swallowing strategies with no overt s/s of aspiration  Skilled Therapeutic Interventions: Co-treatment group session; SLP facilitated session by educating patient and family on strategy to utilize to compensate for left anterior loss of food.  SLP also facilitated session with mod faded to min verbal and tactile cues to switch between tasks during self-feeding.  Patient demonstrated no overt s/s of aspiration during meal.     FIM:  FIM - Eating Eating Activity: 5: Supervision/cues;5: Set-up assist for open containers;5: Set-up assist for cut food  Pain Pain Assessment Pain Assessment: No/denies pain  Therapy/Group: Group Therapy  Charlane Ferretti., CCC-SLP [phone]  [name] 11/24/2011, 1:04 PM

## 2011-11-24 NOTE — Progress Notes (Signed)
Occupational Therapy Session Note  Patient Details  Name: Anthony Hardin MRN: [id number] Date of Birth: [birth year]  Today's Date: 11/24/2011 Time: 5621-3086 Time Calculation (min): 63 min  Short Term Goals: Week 2:  OT Short Term Goal 1 (Week 2): Pt will perform grooming activities seated at sink w/ Mod Assist OT Short Term Goal 2 (Week 2): Pt will complete UB dressing with min assist with hemi-technique OT Short Term Goal 3 (Week 2): Pt will complete LB dressing with mod assist OT Short Term Goal 4 (Week 2): Pt will complete toilet transfer with mod assist in 2 out of 3 attempts  Skilled Therapeutic Interventions/Progress Updates:    Pt seen for self-care retraining with focus on self-feeding task.  Pt reports not having received breakfast yet and wanting to work at that before anything else.  Completed squat pivot transfer from bed > w/c with mod assist and mod verbal cues for sequencing.  Engaged in self-feeding task with cues for pt to set up tray to be more conducive to independence with self-feeding with RUE.  Focus on scanning to Lt to obtain items, opening items with either RUE or hand over hand to incorporate LUE into task, and carryover of strategies discussed with SLP.  Hand over hand assist with support at Lt elbow to increase elbow extension and shoulder flexion to obtain items.  Pt able to bring coffee cup to mouth with BUE with support at Lt elbow to increase ROM.  Pt overly verbose this session, requiring cues to focus on current task.  Pt required mod cues for safety with self-feeding and cues to swallow before next bite. Pt continues to demonstrate Lt inattention as pt with decreased attention to items on Lt and food on Lt side of plate.  Therapy Documentation Precautions:  Precautions Precautions: Fall Precaution Comments: Lt hemiparesis and sensory deficits and Lt visual field neglect Restrictions Weight Bearing Restrictions: No Pain:  Pt with no c/o pain  See FIM  for current functional status  Therapy/Group: Individual Therapy  Leonette Monarch 11/24/2011, 11:27 AM

## 2011-11-24 NOTE — Consult Note (Signed)
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  According to his medical record, he was admitted on 10/31/2011 with diffuse weakness and unable to move and fell out of bed onto the floor.  An initial MRI of the brain showed an acute hematoma in the right thalamus with mass effect and displacement, distortion of the third ventricle, but no suspicion of hydrocephalus.   He was followed with serial CT scans.  His latest scan (11/04/2011) demonstrated stable left hemorrhage involving right thalamus with stable surrounding vasogenic edema.  Bouts of diplopia and left inattention were noted.    PROCEDURES: [3 units of 46962 on 11/23/11]  The following tests were performed during today's visit: Repeatable Battery for the Assessment of Neuropsychological Status (RBANS, form A), Geriatric Anxiety Inventory, and the Geriatric Depression Scale (short form).  Test results are as follows:   RBANS Indices Scaled Score Percentile Description  Immediate Memory  87 19 Below Average  Visuospatial/Constructional 75 5 Impaired  Language 82 12 Below Average  Attention 75 5 Impaired  Delayed Memory 91 27 Average  Total Score 78 7 Impaired   RBANS Subtests Raw Score Percentile Description  List Learning 21 8 Impaired  Story Memory 18 55 Average  Figure Copy 13 < 1 Profoundly Impaired   Line Orientation 16 45 Average  Picture Naming 10 75 Average  Semantic Fluency 13 5 Impaired  Digit Span 11 58 Average  Coding 7 < 1 Profoundly Impaired   List Recall 6 50 Average  List Recognition 19 32 Average  Story Recall 9 47 Average  Figure recall 5 < 1 Profoundly Impaired   Geriatric Depression Scale (short form) Raw Score = 1/15 Description = WNL   Geriatric Anxiety Inventory Raw Score = 1/20  Description = WNL   Test results revealed evidence of cognitive impairment, particularly on tasks involving executive functioning (e.g. processing speed, complex attention, ability to organize complex visual information, ability to initially encode novel information presented by rote, etc.). In addition, during testing, [name] demonstrated certain dysexecutive behaviors (e.g. interrupting the examiner, having trouble ending conversational exchanges, perseverating on his responses, etc.).  While all other performances were in the average range, compared to his same-age peers, given [name]'s educational and work histories, it is likely that his baseline potential is at least above average, indicating that even average scores may represent a decline from his baseline.  Nevertheless, at this time, he is demonstrating relative strengths in ability to retain and freely recall learned information, confrontation naming, and simple attention.  Of importance, during the evaluation, [name] reported feeling fatigued and he was wearing tape over the right half of the lens of his left eyeglasses to help improve left field visual attention.  Certain results could have been adversely affected by these factors, though they cannot likely fully explain the pattern or magnitude of deficits seen.  [name]'s responses to self-report measures of mood symptoms were not suggestive of significant depression or anxiety at this time.  His cognitive functioning may continue to improve as he recovers from his stroke.  As such, re-evaluation prior to discharge will be useful in tracking his progress.    In light of these findings, the following recommendations are provided.    RECOMMENDATIONS:  Recommendations for treatment team:     When interacting with [name], directions and  information should be provided in a simple, straight forward manner, and the treatment team should avoid giving multiple instructions  simultaneously.    To the extent possible, multitasking should be avoided.   [name] requires more time than typical to process information. The treatment team may benefit from waiting for a verbal response to information before presenting additional information.    [name] demonstrated a tendency to be impulsive in attempting to begin activities prior to all instructions being provided. Members of the treatment team should be aware that they may need to stop the patient from starting something in order to ensure that he has a clear understanding of what he is supposed to do.    Be aware that [name] may be impulsive and perseverative. His perseverative speech is not likely related to forgetfulness, but is more likely related to an inability to control himself.    Performance will generally be best in a structured, routine, and familiar environment, as opposed to situations involving complex problems.   Recommendations for discharge planning:    Complete a comprehensive neuropsychological evaluation as an outpatient in 8-12 months to assess for interval change.   Maintain engagement in mentally, physically and cognitively stimulating activities.    Strive to maintain a healthy lifestyle (e.g., proper diet and exercise) in order to promote physical, cognitive and emotional health.   Leavy Cella, Psy.D.  Clinical Neuropsychologist

## 2011-11-24 NOTE — Progress Notes (Signed)
Physical Therapy Session Note  Patient Details  Name: Anthony Hardin MRN: [id number] Date of Birth: 1950-11-26  Today's Date: 11/24/2011 Time: 1000-1100 Time Calculation (min): 60 min  Short Term Goals: Week 1:  PT Short Term Goal 1 (Week 1): pt will be able to SPT with ModA.   PT Short Term Goal 1 - Progress (Week 1): Met PT Short Term Goal 2 (Week 1): pt will perform bed mobility with rail with MinA.   PT Short Term Goal 2 - Progress (Week 1): Met PT Short Term Goal 3 (Week 1): pt will be able to stand during a functional task with MinA.   PT Short Term Goal 3 - Progress (Week 1): Progressing toward goal PT Short Term Goal 4 (Week 1): pt will ambulate with with Max A 20'.   PT Short Term Goal 4 - Progress (Week 1): Progressing toward goal     Therapy Documentation Precautions:  Precautions Precautions: Fall Precaution Comments: Lt hemiparesis and sensory deficits and Lt visual field neglect Restrictions Weight Bearing Restrictions: No   Pain: Pain Assessment Pain Assessment: No/denies pain Patient reports some burning in LUE today at the beginning of therapy session.  Mobility: Patient performed squat pivot transfers with min assist. Patient required vcs for initiation and visual cues for target. Patient performed sit <> stand transfers with min assist for safety and steadying.    Locomotion : Ambulation Ambulation: Yes Ambulation/Gait Assistance: 1: +2 Total assist Ambulation Distance (Feet): 50 Feet  Patient ambulated 50 feet with RW and mod to max assist for LLE progression and weight shifting x 2 sets. Patient continues to present with decreased step length, difficulty weight shifting bilaterally, and L hip retraction, resulting in increased gait velocity.Patient required vcs for initiation, sequencing, and form.     Balance: Patient performed mini squats in tall kneeling with emphasis on maintaining balance throughout movement, keeping weight evenly distributed  between LEs, and getting L hip extended and forward; performed reaching task with RUE in tall kneeling to encourage anterior movement of L hip extension and maintaining balance. Patient required frequent rest breaks throughout.   Tall kneeling exercises were followed with forward stepping with LLE, again with emphasis on weight shifting, weight bearing through LLE, and bring left hip forward. Patient required max vcs and mod to max assist x 2.    Other Treatments:   Patient demonstrated ability to propel w/c from room to PT gym (~75 feet) with mod vcs. Patient was able to verbalize directions to PT gym and manage both L and R w/c brakes, which he was unable to do yesterday.  See FIM for current functional status  Therapy/Group: Individual Therapy  Callie Facey Hamilton DPT Student  11/24/2011, 11:47 AM

## 2011-11-24 NOTE — Progress Notes (Signed)
Patient ID: Anthony Hardin, male   DOB: 1950-07-31, 61 y.o.   MRN: [id number] Admitted 10/31/2011 with diffuse weakness and unable to move and fell out of bed onto the floor. MRI of the brain showed a 4 x 1 x 2.5 cm acute hematoma right thalamus with mass effect and displacement distortion of the third ventricle but no suspicion of obstructive hydrocephalus. MRA of the head with no stenosis or occlusion.   Subjective/Complaints: " I am starting to become aware of Left side" Slept well No other c/os  No abd pain    Review of Systems  Musculoskeletal: Negative for myalgias.  Neurological: Positive for sensory change and focal weakness.  All other systems reviewed and are negative.   Objective: Vital Signs: Blood pressure 120/69, pulse 69, temperature 98 F (36.7 C), temperature source Oral, resp. rate 19, height 5\' 9"  (1.753 m), weight 70.7 kg (155 lb 13.8 oz), SpO2 98.00%. No results found. No results found for this or any previous visit (from the past 72 hour(s)).   HEENT: normal Cardio: RRR Resp: CTA B/L GI: BS positive Extremity:  No Edema Skin:   Intact Neuro: Confused, Abnormal Sensory absent sensation L side lt touch and proprio, Abnormal Motor 2/5 finger flex and arm ext, 2/5 hip/knee extensory synergy,0/5 L Ankle Abnormal FMC Reflexes 3+, Reflexes: 3+, Tone:  Hypertonia, Inattention and Other L homonymous hemianopsia Musc/Skel:  Normal Better visuospatial awareness on the left. Much more clear and focused this am with thought processing Sensation absent L arm and leg Assessment/Plan: 1. Functional deficits secondary to R thalamic bleed which require 3+ hours per day of interdisciplinary therapy in a comprehensive inpatient rehab setting. Physiatrist is providing close team supervision and 24 hour management of active medical problems listed below. Physiatrist and rehab team continue to assess barriers to discharge/monitor patient progress toward functional and medical  goals. FIM: FIM - Bathing Bathing Steps Patient Completed: Chest;Left Arm;Abdomen;Front perineal area;Right upper leg;Left upper leg Bathing: 3: Mod-Patient completes 5-7 76f 10 parts or 50-74%  FIM - Upper Body Dressing/Undressing Upper body dressing/undressing steps patient completed: Put head through opening of pull over shirt/dress;Pull shirt over trunk Upper body dressing/undressing: 3: Mod-Patient completed 50-74% of tasks FIM - Lower Body Dressing/Undressing Lower body dressing/undressing steps patient completed: Pull underwear up/down;Pull pants up/down Lower body dressing/undressing: 2: Max-Patient completed 25-49% of tasks  FIM - Toileting Toileting: 1: Total-Patient completed zero steps, helper did all 3  FIM - Diplomatic Services operational officer Devices: Grab bars Toilet Transfers: 3-To toilet/BSC: Mod A (lift or lower assist);3-From toilet/BSC: Mod A (lift or lower assist)  FIM - Bed/Chair Transfer Bed/Chair Transfer Assistive Devices: Arm rests;Bed rails;HOB elevated Bed/Chair Transfer: 4: Supine > Sit: Min A (steadying Pt. > 75%/lift 1 leg);4: Sit > Supine: Min A (steadying pt. > 75%/lift 1 leg);3: Bed > Chair or W/C: Mod A (lift or lower assist);3: Chair or W/C > Bed: Mod A (lift or lower assist)  FIM - Locomotion: Wheelchair Locomotion: Wheelchair: 3: Travels 150 ft or more: maneuvers on rugs and over door sills with moderate assistance  (Pt: 50 - 74%) FIM - Locomotion: Ambulation Locomotion: Ambulation Assistive Devices: TEFL teacher Ambulation/Gait Assistance: 1: +2 Total assist Locomotion: Ambulation: 1: Two helpers  Comprehension Comprehension Mode: Auditory Comprehension: 3-Understands basic 50 - 74% of the time/requires cueing 25 - 50%  of the time  Expression Expression Mode: Verbal Expression: 3-Expresses basic 50 - 74% of the time/requires cueing 25 - 50% of the time. Needs to  repeat parts of sentences.  Social Interaction Social  Interaction: 3-Interacts appropriately 50 - 74% of the time - May be physically or verbally inappropriate.  Problem Solving Problem Solving: 2-Solves basic 25 - 49% of the time - needs direction more than half the time to initiate, plan or complete simple activities  Memory Memory: 2-Recognizes or recalls 25 - 49% of the time/requires cueing 51 - 75% of the time  2. Anticoagulation/DVT prophylaxis with non-Pharmaceutical:  Medical Problem List and Plan:  1. 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 DVT Prophylaxis/Anticoagulation: Subcutaneous Lovenox initiated 10/31/2011. Monitor platelet counts and any signs of bleeding  3. Mood: Continue Ritalin, now is on bid.  4. Neuropsych: This patient is not capable of making decisions on his/her own behalf at this time.He has very poor awareness of deficits and grossly overestimates his physical and cognitive abilities  5. Hypertension. Norvasc 10 mg daily, clonidine patch 0.1 mg weekly, hydrochlorothiazide 25 mg daily, lisinopril 20 mg a.m. and 10 mg at bedtime. Monitor with increased mobility  6. Dysphagia. Mechanical soft diet thin liquids. Speech therapy followup. Monitor for any signs of aspiration.     LOS (Days) 10 A FACE TO FACE EVALUATION WAS PERFORMED  KIRSTEINS,ANDREW E 11/24/2011, 8:27 AM

## 2011-11-24 NOTE — Progress Notes (Signed)
I agree with progress note V. Hemrich 11/24/11.

## 2011-11-24 NOTE — Progress Notes (Signed)
Occupationcal Therapy Note  Patient Details  Name: Anthony Hardin MRN: [id number] Date of Birth: [birth year] Today's Date: 11/24/2011 Group Session Time:  1130-1200  (30 min) Pain:  Pt engaged in feeding group with 2 family members present, 4 other patients, 3 staff.   Addressed maintaining selective attention and alternating during meal.  Pt had difficult time with selective and alternating due distracting environment.  Family cued him to wipe mouth before he was aware of the problem.  Utilized LUE in weight bearing and gross assist with feeding self with support from other hand.  Pt. Frustrated with the constant reminders to do a task.   Family wanted to take him to the atrium ,but pt elected to go back to his room.    [name] 11/24/2011, 5:46 PM

## 2011-11-24 NOTE — Progress Notes (Signed)
Speech Language Pathology Daily Session Note  Patient Details  Name: Anthony Hardin MRN: [id number] Date of Birth: 1950/05/16  Today's Date: 11/24/2011 Time: 1515-1600 Time Calculation (min): 45 min  Short Term Goals: Week 2: SLP Short Term Goal 1 (Week 2): Patient will sustain attention to a basic familiar task for 2 minutes with mod multimodal cues.  SLP Short Term Goal 2 (Week 2): Patient will demonstrate emergent awareness by asking for help when needed with max assist verbal cues.  SLP Short Term Goal 3 (Week 2): Patient will solve basic familiar problems with mod assist multimodal cues. SLP Short Term Goal 4 (Week 2): Patient will consume dys. 3 textures and thin liquids with supervision cues with no overt s/s of aspiration. SLP Short Term Goal 5 (Week 2): Patient will tolerate trials of regular textures with min assist to utilize compensatory swallowing strategies with no overt s/s of aspiration  Skilled Therapeutic Interventions: Treatment session focused on addressing cognitive goals.  SLP facilitated session with increased processing/wait time, mod assist verbal and visual cues to select attention to scanning tasks such as locating information on an menu and objects in a distracting background.    FIM:  Comprehension Comprehension Mode: Auditory Comprehension: 4-Understands basic 75 - 89% of the time/requires cueing 10 - 24% of the time Expression Expression Mode: Verbal Expression: 5-Expresses basic needs/ideas: With extra time/assistive device Social Interaction Social Interaction: 4-Interacts appropriately 75 - 89% of the time - Needs redirection for appropriate language or to initiate interaction. Problem Solving Problem Solving: 3-Solves basic 50 - 74% of the time/requires cueing 25 - 49% of the time Memory Memory: 3-Recognizes or recalls 50 - 74% of the time/requires cueing 25 - 49% of the time FIM - Eating Eating Activity: 5: Supervision/cues;5: Set-up assist for  open containers;5: Set-up assist for cut food  Pain Pain Assessment Pain Assessment: No/denies pain  Therapy/Group: Individual Therapy  Charlane Ferretti., CCC-SLP [phone]  Breanah Faddis 11/24/2011, 4:55 PM

## 2011-11-25 ENCOUNTER — Inpatient Hospital Stay (HOSPITAL_COMMUNITY): Payer: BC Managed Care – PPO | Admitting: Speech Pathology

## 2011-11-25 ENCOUNTER — Inpatient Hospital Stay (HOSPITAL_COMMUNITY): Payer: BC Managed Care – PPO

## 2011-11-25 NOTE — Progress Notes (Signed)
Occupational Therapy Session Note  Patient Details  Name: MURRIEL EIDEM MRN: [id number] Date of Birth: 1950/07/29  Today's Date: 11/25/2011 Time: 1478-2956 Time Calculation (min): 15 min   Skilled Therapeutic Interventions/Progress Updates: participated in d/club with focus on slowing down to eat and visual scanning of table to locate foods.  Niece present and supportive     Therapy Documentation Precautions:  Precautions Precautions: Fall Precaution Comments: Lt hemiparesis and sensory deficits and Lt visual field neglect Restrictions Weight Bearing Restrictions: No   Pain: Pain Assessment Pain Assessment: No/denies pain  See FIM for current functional status  Therapy/Group: Group Therapy  Rozelle Logan 11/25/2011, 3:34 PM

## 2011-11-25 NOTE — Progress Notes (Signed)
Occupational Therapy Session Note  Patient Details  Name: Anthony Hardin MRN: [id number] Date of Birth: [birth year]  Today's Date: 11/25/2011 Time: 1330-1430 Time Calculation (min): 60 min  Skilled Therapeutic Interventions: Patient completed 60 minutes of therapeutic group exercises (Neuromuscular Arm Exercise Group) with emphasis on body awareness, improved sensory integration, normalization of movement patterns, and general endurance.   Patient reported that his arm "felt so heavy" during supervised self-range of motion exercises but he progressed through activity with min assist from his niece who remained beside him for occasional assist with attention and comprehension and for additional encouragement.    Therapy Documentation Precautions:  Precautions Precautions: Fall Precaution Comments: Lt hemiparesis and sensory deficits and Lt visual field neglect Restrictions Weight Bearing Restrictions: No  Pain: Pain Assessment Pain Assessment: No/denies pain  See FIM for current functional status  Therapy/Group: Group Therapy  Georgeanne Nim 11/25/2011, 4:02 PM

## 2011-11-25 NOTE — Progress Notes (Signed)
Patient ID: Anthony Hardin, male   DOB: 1950-03-19, 61 y.o.   MRN: [id number] Patient ID: Anthony Hardin, male   DOB: 02-10-1951, 61 y.o.   MRN: [id number] Admitted 10/31/2011 with diffuse weakness and unable to move and fell out of bed onto the floor. MRI of the brain showed a 4 x 1 x 2.5 cm acute hematoma right thalamus with mass effect and displacement distortion of the third ventricle but no suspicion of obstructive hydrocephalus. MRA of the head with no stenosis or occlusion.   Subjective/Complaints:  9/14. Feels well and had very comfortable night. Feels that he has better focusing ability and that his visuospatial  Awareness has improved.  ENT negative. Chest clear. Cardiovascular normal heart sounds. Abdomen soft nontender. No distention. Extremities no edema. Left-sided weakness.  BP Readings from Last 3 Encounters:  11/25/11 118/80  11/14/11 125/71      Review of Systems  Musculoskeletal: Negative for myalgias.  Neurological: Positive for sensory change and focal weakness.  All other systems reviewed and are negative.   Objective: Vital Signs: Blood pressure 118/80, pulse 61, temperature 98.2 F (36.8 C), temperature source Oral, resp. rate 18, height 5\' 9"  (1.753 m), weight 70.7 kg (155 lb 13.8 oz), SpO2 100.00%. No results found. No results found for this or any previous visit (from the past 72 hour(s)).   HEENT: normal Cardio: RRR Resp: CTA B/L GI: BS positive Extremity:  No Edema Skin:   Intact Neuro: Confused, Abnormal Sensory absent sensation L side lt touch and proprio, Abnormal Motor 2/5 finger flex and arm ext, 2/5 hip/knee extensory synergy,0/5 L Ankle Abnormal FMC Reflexes 3+, Reflexes: 3+, Tone:  Hypertonia, Inattention and Other L homonymous hemianopsia Musc/Skel:  Normal Better visuospatial awareness on the left. Much more clear and focused this am with thought processing Sensation absent L arm and leg Assessment/Plan: 1. Functional deficits secondary  to R thalamic bleed which require 3+ hours per day of interdisciplinary therapy in a comprehensive inpatient rehab setting. Physiatrist is providing close team supervision and 24 hour management of active medical problems listed below. Physiatrist and rehab team continue to assess barriers to discharge/monitor patient progress toward functional and medical goals. FIM: FIM - Bathing Bathing Steps Patient Completed: Chest;Left Arm;Abdomen;Front perineal area;Right upper leg;Left upper leg Bathing: 3: Mod-Patient completes 5-7 68f 10 parts or 50-74%  FIM - Upper Body Dressing/Undressing Upper body dressing/undressing steps patient completed: Put head through opening of pull over shirt/dress;Pull shirt over trunk Upper body dressing/undressing: 3: Mod-Patient completed 50-74% of tasks FIM - Lower Body Dressing/Undressing Lower body dressing/undressing steps patient completed: Pull underwear up/down;Pull pants up/down Lower body dressing/undressing: 2: Max-Patient completed 25-49% of tasks  FIM - Toileting Toileting: 1: Total-Patient completed zero steps, helper did all 3  FIM - Diplomatic Services operational officer Devices: Grab bars Toilet Transfers: 3-To toilet/BSC: Mod A (lift or lower assist);3-From toilet/BSC: Mod A (lift or lower assist)  FIM - Bed/Chair Transfer Bed/Chair Transfer Assistive Devices: Arm rests;Bed rails Bed/Chair Transfer: 4: Supine > Sit: Min A (steadying Pt. > 75%/lift 1 leg);3: Sit > Supine: Mod A (lifting assist/Pt. 50-74%/lift 2 legs)  FIM - Locomotion: Wheelchair Locomotion: Wheelchair: 2: Travels 50 - 149 ft with minimal assistance (Pt.>75%) FIM - Locomotion: Ambulation Locomotion: Ambulation Assistive Devices: Orthosis;Walker - Rolling Ambulation/Gait Assistance: 1: +2 Total assist Locomotion: Ambulation: 1: Two helpers  Comprehension Comprehension Mode: Auditory Comprehension: 4-Understands basic 75 - 89% of the time/requires cueing 10 - 24% of the  time  Expression Expression  Mode: Verbal Expression: 4-Expresses basic 75 - 89% of the time/requires cueing 10 - 24% of the time. Needs helper to occlude trach/needs to repeat words.  Social Interaction Social Interaction: 4-Interacts appropriately 75 - 89% of the time - Needs redirection for appropriate language or to initiate interaction.  Problem Solving Problem Solving: 3-Solves basic 50 - 74% of the time/requires cueing 25 - 49% of the time  Memory Memory: 3-Recognizes or recalls 50 - 74% of the time/requires cueing 25 - 49% of the time  2. Anticoagulation/DVT prophylaxis with non-Pharmaceutical:  Medical Problem List and Plan:  1. 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 DVT Prophylaxis/Anticoagulation: Subcutaneous Lovenox initiated 10/31/2011. Monitor platelet counts and any signs of bleeding  3. Mood: Continue Ritalin, now is on bid.  4. Neuropsych: This patient is not capable of making decisions on his/her own behalf at this time.He has very poor awareness of deficits and grossly overestimates his physical and cognitive abilities  5. Hypertension. Norvasc 10 mg daily, clonidine patch 0.1 mg weekly, hydrochlorothiazide 25 mg daily, lisinopril 20 mg a.m. and 10 mg at bedtime. Monitor with increased mobility  6. Dysphagia. Mechanical soft diet thin liquids. Speech therapy followup. Monitor for any signs of aspiration.     LOS (Days) 11 A FACE TO FACE EVALUATION WAS PERFORMED  Rogelia Boga 11/25/2011, 9:19 AM

## 2011-11-25 NOTE — Progress Notes (Signed)
Speech Language Pathology Daily Session Note  Patient Details  Name: Anthony Hardin MRN: [id number] Date of Birth: [birth year]  Today's Date: 11/25/2011 Time: 1130-1145 Time Calculation (min): 15 min  Short Term Goals: Week 2: SLP Short Term Goal 1 (Week 2): Patient will sustain attention to a basic familiar task for 2 minutes with mod multimodal cues.  SLP Short Term Goal 2 (Week 2): Patient will demonstrate emergent awareness by asking for help when needed with max assist verbal cues.  SLP Short Term Goal 3 (Week 2): Patient will solve basic familiar problems with mod assist multimodal cues. SLP Short Term Goal 4 (Week 2): Patient will consume dys. 3 textures and thin liquids with supervision cues with no overt s/s of aspiration. SLP Short Term Goal 5 (Week 2): Patient will tolerate trials of regular textures with min assist to utilize compensatory swallowing strategies with no overt s/s of aspiration  Skilled Therapeutic Interventions: Co-treatment group session;  SLP facilitated session by educating patient and family on strategy to utilize to compensate for left anterior loss of food.  SLP also facilitated session with min verbal cues to switch between tasks during self-feeding.  Patient demonstrated no overt s/s of aspiration during meal.     FIM:  Comprehension Comprehension Mode: Auditory Comprehension: 4-Understands basic 75 - 89% of the time/requires cueing 10 - 24% of the time Expression Expression Mode: Verbal Expression: 5-Expresses basic needs/ideas: With extra time/assistive device Social Interaction Social Interaction: 4-Interacts appropriately 75 - 89% of the time - Needs redirection for appropriate language or to initiate interaction. Problem Solving Problem Solving: 3-Solves basic 50 - 74% of the time/requires cueing 25 - 49% of the time Memory Memory: 3-Recognizes or recalls 50 - 74% of the time/requires cueing 25 - 49% of the time FIM - Eating Eating Activity:  5: Supervision/cues;5: Set-up assist for open containers;5: Set-up assist for cut food  Pain Pain Assessment Pain Assessment: No/denies pain  Therapy/Group: Group Therapy  Charlane Ferretti., CCC-SLP [id number]  [name] 11/25/2011, 1:14 PM

## 2011-11-26 ENCOUNTER — Inpatient Hospital Stay (HOSPITAL_COMMUNITY): Payer: BC Managed Care – PPO

## 2011-11-26 NOTE — Progress Notes (Signed)
Occupational Therapy Session Note  Patient Details  Name: Anthony Hardin MRN: [id number] Date of Birth: [birth year]  Today's Date: 11/26/2011 Time: 4098-1191 Time Calculation (min): 55 min  Short Term Goals: Week 2:  OT Short Term Goal 1 (Week 2): Pt will perform grooming activities seated at sink w/ Mod Assist OT Short Term Goal 2 (Week 2): Pt will complete UB dressing with min assist with hemi-technique OT Short Term Goal 3 (Week 2): Pt will complete LB dressing with mod assist OT Short Term Goal 4 (Week 2): Pt will complete toilet transfer with mod assist in 2 out of 3 attempts  Skilled Therapeutic Interventions: Patient participated in therapeutic activities group with emphasis on neuromuscular re-ed of left UE.   Patient completed exercises as instructed but reported fatigue and need for bed rest following activity.   Patient appeared to have difficulty comprehending intent of tangential discussions with other patients in group.     Therapy Documentation Precautions:  Precautions Precautions: Fall Precaution Comments: Lt hemiparesis and sensory deficits and Lt visual field neglect Restrictions Weight Bearing Restrictions: No  Pain: Pain Assessment Pain Assessment: No/denies pain  See FIM for current functional status  Therapy/Group: Group Therapy  Georgeanne Nim 11/26/2011, 3:55 PM

## 2011-11-26 NOTE — Progress Notes (Signed)
Patient ID: Anthony Hardin, male   DOB: 1950/09/04, 61 y.o.   MRN: [id number] Patient ID: Anthony Hardin, male   DOB: February 04, 1951, 61 y.o.   MRN: [id number] Patient ID: Anthony Hardin, male   DOB: [birth year], 61 y.o.   MRN: [id number] Admitted 10/31/2011 with diffuse weakness and unable to move and fell out of bed onto the floor. MRI of the brain showed a 4 x 1 x 2.5 cm acute hematoma right thalamus with mass effect and displacement distortion of the third ventricle but no suspicion of obstructive hydrocephalus. MRA of the head with no stenosis or occlusion.   Subjective/Complaints:  9/15. Feels well and had very comfortable night.  Feels that he has improved strength on the left and is encouraged with his slow clinical improvement  ENT negative. Chest clear. Cardiovascular normal heart sounds. Abdomen soft nontender. Condom catheter in place.  No distention. Extremities no edema. Left-sided weakness.  BP Readings from Last 3 Encounters:  11/26/11 119/77  11/14/11 125/71      Review of Systems  Musculoskeletal: Negative for myalgias.  Neurological: Positive for sensory change and focal weakness.  All other systems reviewed and are negative.   Objective: Vital Signs: Blood pressure 119/77, pulse 63, temperature 97.5 F (36.4 C), temperature source Oral, resp. rate 17, height 5\' 9"  (1.753 m), weight 70.7 kg (155 lb 13.8 oz), SpO2 97.00%. No results found. No results found for this or any previous visit (from the past 72 hour(s)).   HEENT: normal Cardio: RRR Resp: CTA B/L GI: BS positive Extremity:  No Edema Skin:   Intact Neuro: Confused, Abnormal Sensory absent sensation L side lt touch and proprio, Abnormal Motor 2/5 finger flex and arm ext, 2/5 hip/knee extensory synergy,0/5 L Ankle Abnormal FMC Reflexes 3+, Reflexes: 3+, Tone:  Hypertonia, Inattention and Other L homonymous hemianopsia Musc/Skel:  Normal Better visuospatial awareness on the left. Much more clear and focused  this am with thought processing Sensation absent L arm and leg Assessment/Plan: 1. Functional deficits secondary to R thalamic bleed which require 3+ hours per day of interdisciplinary therapy in a comprehensive inpatient rehab setting. Physiatrist is providing close team supervision and 24 hour management of active medical problems listed below. Physiatrist and rehab team continue to assess barriers to discharge/monitor patient progress toward functional and medical goals. FIM: FIM - Bathing Bathing Steps Patient Completed: Chest;Left Arm;Abdomen;Front perineal area;Right upper leg;Left upper leg Bathing: 3: Mod-Patient completes 5-7 73f 10 parts or 50-74%  FIM - Upper Body Dressing/Undressing Upper body dressing/undressing steps patient completed: Put head through opening of pull over shirt/dress;Pull shirt over trunk Upper body dressing/undressing: 3: Mod-Patient completed 50-74% of tasks FIM - Lower Body Dressing/Undressing Lower body dressing/undressing steps patient completed: Pull underwear up/down;Pull pants up/down Lower body dressing/undressing: 2: Max-Patient completed 25-49% of tasks  FIM - Toileting Toileting: 1: Total-Patient completed zero steps, helper did all 3  FIM - Diplomatic Services operational officer Devices: Grab bars Toilet Transfers: 3-To toilet/BSC: Mod A (lift or lower assist);3-From toilet/BSC: Mod A (lift or lower assist)  FIM - Bed/Chair Transfer Bed/Chair Transfer Assistive Devices: Arm rests;Bed rails Bed/Chair Transfer: 4: Supine > Sit: Min A (steadying Pt. > 75%/lift 1 leg);3: Sit > Supine: Mod A (lifting assist/Pt. 50-74%/lift 2 legs)  FIM - Locomotion: Wheelchair Locomotion: Wheelchair: 2: Travels 50 - 149 ft with minimal assistance (Pt.>75%) FIM - Locomotion: Ambulation Locomotion: Ambulation Assistive Devices: Orthosis;Walker - Rolling Ambulation/Gait Assistance: 1: +2 Total assist Locomotion: Ambulation: 1: Two  helpers  Comprehension Comprehension Mode: Auditory Comprehension: 4-Understands basic 75 - 89% of the time/requires cueing 10 - 24% of the time  Expression Expression Mode: Verbal Expression: 5-Expresses basic needs/ideas: With extra time/assistive device  Social Interaction Social Interaction: 4-Interacts appropriately 75 - 89% of the time - Needs redirection for appropriate language or to initiate interaction.  Problem Solving Problem Solving: 3-Solves basic 50 - 74% of the time/requires cueing 25 - 49% of the time  Memory Memory: 3-Recognizes or recalls 50 - 74% of the time/requires cueing 25 - 49% of the time  2. Anticoagulation/DVT prophylaxis with non-Pharmaceutical:  Medical Problem List and Plan:  1. 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 DVT Prophylaxis/Anticoagulation: Subcutaneous Lovenox initiated 10/31/2011. Monitor platelet counts and any signs of bleeding  3. Mood: Continue Ritalin, now is on bid.  4. Neuropsych: This patient is not capable of making decisions on his/her own behalf at this time.He has very poor awareness of deficits and grossly overestimates his physical and cognitive abilities  5. Hypertension. Norvasc 10 mg daily, clonidine patch 0.1 mg weekly, hydrochlorothiazide 25 mg daily, lisinopril 20 mg a.m. and 10 mg at bedtime. Monitor with increased mobility  6. Dysphagia. Mechanical soft diet thin liquids. Speech therapy followup. Monitor for any signs of aspiration.     LOS (Days) 12 A FACE TO FACE EVALUATION WAS PERFORMED  Rogelia Boga 11/26/2011, 8:50 AM

## 2011-11-27 ENCOUNTER — Inpatient Hospital Stay (HOSPITAL_COMMUNITY): Payer: BC Managed Care – PPO | Admitting: Occupational Therapy

## 2011-11-27 ENCOUNTER — Inpatient Hospital Stay (HOSPITAL_COMMUNITY): Payer: BC Managed Care – PPO | Admitting: *Deleted

## 2011-11-27 ENCOUNTER — Inpatient Hospital Stay (HOSPITAL_COMMUNITY): Payer: BC Managed Care – PPO | Admitting: Speech Pathology

## 2011-11-27 NOTE — Progress Notes (Signed)
Speech Language Pathology Daily Session Note  Patient Details  Name: BRIXTON FRANKO MRN: [id number] Date of Birth: August 29, 1950  Today's Date: 11/27/2011 Time: 1015-1100 Time Calculation (min): 45 min  Short Term Goals: Week 2: SLP Short Term Goal 1 (Week 2): Patient will sustain attention to a basic familiar task for 2 minutes with mod multimodal cues.  SLP Short Term Goal 1 - Progress (Week 2): Progressing toward goal SLP Short Term Goal 2 (Week 2): Patient will demonstrate emergent awareness by asking for help when needed with max assist verbal cues.  SLP Short Term Goal 2 - Progress (Week 2): Progressing toward goal SLP Short Term Goal 3 (Week 2): Patient will solve basic familiar problems with mod assist multimodal cues. SLP Short Term Goal 3 - Progress (Week 2): Progressing toward goal SLP Short Term Goal 4 (Week 2): Patient will consume dys. 3 textures and thin liquids with supervision cues with no overt s/s of aspiration. SLP Short Term Goal 5 (Week 2): Patient will tolerate trials of regular textures with min assist to utilize compensatory swallowing strategies with no overt s/s of aspiration  Skilled Therapeutic Interventions: Treament focused on problem solving, safety awareness, anticipatory awareness. SLP provided mild problem solving tasks related to functional ADLs  in which patient was able to complete with min verbal cueing for initiation of tasks, moderate assist for basic problem solving, and mod assist for awareness of errors. Patient verballized concern overt potential left sided neglect, including its presence and potential affects on independent living (demonstrating anticipatory awareness with min questioning cues). SLP provided education regarding compensatory strategies for improved awareness of left side.    FIM:  Comprehension Comprehension Mode: Auditory Comprehension: 4-Understands basic 75 - 89% of the time/requires cueing 10 - 24% of the  time Expression Expression Mode: Verbal Expression: 5-Expresses basic needs/ideas: With extra time/assistive device Social Interaction Social Interaction: 4-Interacts appropriately 75 - 89% of the time - Needs redirection for appropriate language or to initiate interaction. Problem Solving Problem Solving: 3-Solves basic 50 - 74% of the time/requires cueing 25 - 49% of the time Memory Memory: 3-Recognizes or recalls 50 - 74% of the time/requires cueing 25 - 49% of the time  Pain Pain Assessment Pain Assessment: No/denies pain  Therapy/Group: Individual Therapy  Echo [place] Meryl 11/27/2011, 11:14 AM

## 2011-11-27 NOTE — Progress Notes (Signed)
Occupational Therapy Session Note  Patient Details  Name: Anthony Hardin MRN: [id number] Date of Birth: 03/22/1950  Today's Date: 11/27/2011 Time: 1200-1215 Time Calculation (min): 15 min  Skilled Therapeutic Interventions/Progress Updates:   Pt seen in diners club today with focus on  participated in d/club with focus on visual scanning, task initiation & LUE as gross assist. Pt noted to attempt to hold spoon in L hand while eating w/ R non dominant UE.  Therapy Documentation Precautions:  Precautions Precautions: Fall Precaution Comments: Lt hemiparesis and sensory deficits and Lt visual field neglect Restrictions Weight Bearing Restrictions: No   Pain: Pain Assessment Pain Assessment: No/denies pain  :   See FIM for current functional status  Therapy/Group: Group Therapy  Charletta Cousin, Amy Beth Dixon 11/27/2011, 12:54 PM

## 2011-11-27 NOTE — Progress Notes (Signed)
Physical Therapy Session Note  Patient Details  Name: Anthony Hardin MRN: [id number] Date of Birth: November 13, 1950  Today's Date: 11/27/2011 Time: 1300-1400 Time Calculation (min): 60 min  Short Term Goals: Week 2:  PT Short Term Goal 1 (Week 2): Patient will perform bed mobility on flat bed min A to L and R PT Short Term Goal 2 (Week 2): Patient will perform functional transfers to L and R with min A and mod-max verbal cues for initiation, sequence and to attend to L PT Short Term Goal 3 (Week 2): Patient will perform w/c mobility on unit x 150 with mod A and mod-max verbal and visual cues to attend to L environment PT Short Term Goal 4 (Week 2): Patient will perform gait training on unit x 75' with max A of one person with increased LLE advancement and activation of LLE in stance.     Therapy Documentation Precautions:  Precautions Precautions: Fall Precaution Comments: Lt hemiparesis and sensory deficits and Lt visual field neglect Restrictions Weight Bearing Restrictions: No     Pain: Pain Assessment Pain Assessment: No/denies pain Mobility: Patient performed several sit <> stand transfers from different surfaces including w/c, toilet, and PT mat with UE support and min guard to min assist. Patient continues to need some cueing for initiation, proper hand placement when standing/safety, and sequencing, however is continuing to progress with his transfers.    Locomotion : Ambulation Ambulation: Yes Ambulation/Gait Assistance: 3: Mod assist Ambulation Distance (Feet): 15 Feet , 15 feet Patient ambulated 15 feet from w/c to toilet with RW using L hand orthosis and mod assist. Patient demonstrated ability to manage clothing with RUE without loss of balance.  Patient ambulated 15 feet from toilet > sink with RW and L hand orthosis and mod assist. Patient continues to need vcs for sequencing, however was able to verbalize stepping sequence by the end of his walk. Patient also  required intermittent assistance for advancement of RW.  Patient demonstrated ability to stand at sink and wash/dry hands safely requiring min guard for steadying.  Patient propelled w/c using RUE and RLE from room > PT gym (>150 feet) and from PT gym > into hallway (~50 feet) with min assist. Patient requires vcs for directions and to encourage patient to attend to L side. Patient continues to hit L side when distracted.   Balance: Patient performed stepping/tapping with RLE on 4 inch block using mirror, with emphasis on maintaining balance and stabilizing LLE during stance phase of gait. Patient presented with increased difficulty with task, however was able to self correct losses of balance. Patient demonstrated ability to use mirror to assist with upright posture, equal weight distribution, and maintaining balance during RLE step. Patient requires vcs for form, posture, and sequencing.   Exercises: Patient performed hip bumps in tall kneeling to assist with hip extension and getting L hip anterior during gait. Patient required verbal and tactile cues throughout exercise for form, posture, positioning, and sequencing. Half kneeling was attempted to encourage getting L hip forward, however patient was unable to get into position and became very fatigued from trying.    See FIM for current functional status  Therapy/Group: Individual Therapy  Brilee [place] DPT Student   11/27/2011, 2:11 PM

## 2011-11-27 NOTE — Progress Notes (Signed)
Patient ID: Anthony Hardin, male   DOB: 01-03-1951, 61 y.o.   MRN: [id number] Admitted 10/31/2011 with diffuse weakness and unable to move and fell out of bed onto the floor. MRI of the brain showed a 4 x 1 x 2.5 cm acute hematoma right thalamus with mass effect and displacement distortion of the third ventricle but no suspicion of obstructive hydrocephalus. MRA of the head with no stenosis or occlusion.   Subjective/Complaints: L hand hanging in spokes of WC Slept well No other c/os  No abd pain    Review of Systems  Musculoskeletal: Negative for myalgias.  Neurological: Positive for sensory change and focal weakness.  All other systems reviewed and are negative.   Objective: Vital Signs: Blood pressure 120/72, pulse 65, temperature 98.6 F (37 C), temperature source Oral, resp. rate 18, height 5\' 9"  (1.753 m), weight 70.7 kg (155 lb 13.8 oz), SpO2 97.00%. No results found. No results found for this or any previous visit (from the past 72 hour(s)).   HEENT: normal Cardio: RRR Resp: CTA B/L GI: BS positive Extremity:  No Edema Skin:   Intact Neuro: Confused, Abnormal Sensory absent sensation L side lt touch and proprio, Abnormal Motor 2/5 finger flex and arm ext, 2/5 hip/knee extensory synergy,0/5 L Ankle Abnormal FMC Reflexes 3+, Reflexes: 3+, Tone:  Hypertonia, Inattention and Other L homonymous hemianopsia Musc/Skel:  Normal Better visuospatial awareness on the left. Much more clear and focused this am with thought processing Sensation absent L arm and leg Assessment/Plan: 1. Functional deficits secondary to R thalamic bleed which require 3+ hours per day of interdisciplinary therapy in a comprehensive inpatient rehab setting. Physiatrist is providing close team supervision and 24 hour management of active medical problems listed below. Physiatrist and rehab team continue to assess barriers to discharge/monitor patient progress toward functional and medical goals. FIM: FIM -  Bathing Bathing Steps Patient Completed: Chest;Left Arm;Abdomen;Front perineal area;Right upper leg;Left upper leg Bathing: 3: Mod-Patient completes 5-7 4f 10 parts or 50-74%  FIM - Upper Body Dressing/Undressing Upper body dressing/undressing steps patient completed: Put head through opening of pull over shirt/dress;Pull shirt over trunk Upper body dressing/undressing: 3: Mod-Patient completed 50-74% of tasks FIM - Lower Body Dressing/Undressing Lower body dressing/undressing steps patient completed: Pull underwear up/down;Pull pants up/down Lower body dressing/undressing: 2: Max-Patient completed 25-49% of tasks  FIM - Toileting Toileting: 1: Total-Patient completed zero steps, helper did all 3  FIM - Diplomatic Services operational officer Devices: Grab bars Toilet Transfers: 3-To toilet/BSC: Mod A (lift or lower assist);3-From toilet/BSC: Mod A (lift or lower assist)  FIM - Bed/Chair Transfer Bed/Chair Transfer Assistive Devices: Arm rests;Bed rails Bed/Chair Transfer: 4: Supine > Sit: Min A (steadying Pt. > 75%/lift 1 leg);3: Sit > Supine: Mod A (lifting assist/Pt. 50-74%/lift 2 legs)  FIM - Locomotion: Wheelchair Locomotion: Wheelchair: 2: Travels 50 - 149 ft with minimal assistance (Pt.>75%) FIM - Locomotion: Ambulation Locomotion: Ambulation Assistive Devices: Orthosis;Walker - Rolling Ambulation/Gait Assistance: 1: +2 Total assist Locomotion: Ambulation: 1: Two helpers  Comprehension Comprehension Mode: Auditory Comprehension: 4-Understands basic 75 - 89% of the time/requires cueing 10 - 24% of the time  Expression Expression Mode: Verbal Expression: 5-Expresses basic needs/ideas: With extra time/assistive device  Social Interaction Social Interaction: 4-Interacts appropriately 75 - 89% of the time - Needs redirection for appropriate language or to initiate interaction.  Problem Solving Problem Solving: 3-Solves basic 50 - 74% of the time/requires cueing 25 - 49%  of the time  Memory Memory: 3-Recognizes or  recalls 50 - 74% of the time/requires cueing 25 - 49% of the time  2. Anticoagulation/DVT prophylaxis with non-Pharmaceutical:  Medical Problem List and Plan:  1. 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 DVT Prophylaxis/Anticoagulation: Subcutaneous Lovenox initiated 10/31/2011. Monitor platelet counts and any signs of bleeding  3. Mood: Continue Ritalin, now is on bid.  4. Neuropsych: This patient is not capable of making decisions on his/her own behalf at this time.He has very poor awareness of deficits and grossly overestimates his physical and cognitive abilities  5. Hypertension. Norvasc 10 mg daily, clonidine patch 0.1 mg weekly, hydrochlorothiazide 25 mg daily, lisinopril 20 mg a.m. and 10 mg at bedtime. Monitor with increased mobility  6. Dysphagia. Mechanical soft diet thin liquids. Speech therapy followup. Monitor for any signs of aspiration.     LOS (Days) 13 A FACE TO FACE EVALUATION WAS PERFORMED  KIRSTEINS,ANDREW E 11/27/2011, 8:32 AM

## 2011-11-27 NOTE — Progress Notes (Signed)
I was present for this treatment and agree with this note.  Clydene Laming, PT

## 2011-11-27 NOTE — Progress Notes (Signed)
Occupational Therapy Session Note  Patient Details  Name: Anthony Hardin MRN: [id number] Date of Birth: Jun 22, 1950  Today's Date: 11/27/2011 Time: 0830-0930 Time Calculation (min): 60 min  Short Term Goals: Week 2:  OT Short Term Goal 1 (Week 2): Pt will perform grooming activities seated at sink w/ Mod Assist OT Short Term Goal 2 (Week 2): Pt will complete UB dressing with min assist with hemi-technique OT Short Term Goal 3 (Week 2): Pt will complete LB dressing with mod assist OT Short Term Goal 4 (Week 2): Pt will complete toilet transfer with mod assist in 2 out of 3 attempts  Skilled Therapeutic Interventions/Progress Updates:    Pt seen for ADL retraining at walk-in shower level with focus on increased carryover, use of LUE, and alternative techniques to increase independence with dressing.  Pt completed bathing at shower level with hand over hand assist to wash RUE, with additional facilitation at Lt elbow to increase ROM.  Pt demonstrated increased ability to complete LB dressing by crossing Lt foot over Rt knee to don pants.  Backward chaining with socks, again with feet crossed over knees.  Pt completed grooming seated at sink with assist to open toothpaste.   Therapy Documentation Precautions:  Precautions Precautions: Fall Precaution Comments: Lt hemiparesis and sensory deficits and Lt visual field neglect Restrictions Weight Bearing Restrictions: No General:   Vital Signs: Therapy Vitals BP: 120/60 mmHg Pain: Pain Assessment Pain Assessment: No/denies pain  See FIM for current functional status  Therapy/Group: Individual Therapy  Leonette Monarch 11/27/2011, 12:03 PM

## 2011-11-27 NOTE — Progress Notes (Signed)
Speech Language Pathology Daily Session Note  Patient Details  Name: MIKALE SILVERSMITH MRN: [id number] Date of Birth: July 06, 1950  Today's Date: 11/27/2011 Time: 1130-1200 Time Calculation (min): 30 min  Short Term Goals: Week 2: SLP Short Term Goal 1 (Week 2): Patient will sustain attention to a basic familiar task for 2 minutes with mod multimodal cues.  SLP Short Term Goal 1 - Progress (Week 2): Progressing toward goal SLP Short Term Goal 2 (Week 2): Patient will demonstrate emergent awareness by asking for help when needed with max assist verbal cues.  SLP Short Term Goal 2 - Progress (Week 2): Progressing toward goal SLP Short Term Goal 3 (Week 2): Patient will solve basic familiar problems with mod assist multimodal cues. SLP Short Term Goal 3 - Progress (Week 2): Progressing toward goal SLP Short Term Goal 4 (Week 2): Patient will consume dys. 3 textures and thin liquids with supervision cues with no overt s/s of aspiration. SLP Short Term Goal 5 (Week 2): Patient will tolerate trials of regular textures with min assist to utilize compensatory swallowing strategies with no overt s/s of aspiration  Skilled Therapeutic Interventions: Co-treat focused on dysphagia treatment. Patient able to consume recommended diet without overt s/s of aspiration, independent use of lingual sweep to clear mild buccal residuals. Recommend advancement to regular diet, thin liquids, intermittent supervision. Continue current strategies and aspiration precautions.    FIM:  Comprehension Comprehension Mode: Auditory Comprehension: 4-Understands basic 75 - 89% of the time/requires cueing 10 - 24% of the time Expression Expression Mode: Verbal Expression: 5-Expresses basic needs/ideas: With extra time/assistive device Social Interaction Social Interaction: 4-Interacts appropriately 75 - 89% of the time - Needs redirection for appropriate language or to initiate interaction. Problem Solving Problem Solving:  3-Solves basic 50 - 74% of the time/requires cueing 25 - 49% of the time Memory Memory: 3-Recognizes or recalls 50 - 74% of the time/requires cueing 25 - 49% of the time FIM - Eating Eating Activity: 5: Set-up assist for open containers;5: Set-up assist for cut food;5: Supervision/cues  Pain Pain Assessment Pain Assessment: No/denies pain  Therapy/Group: Group Therapy  Callia Swim Meryl 11/27/2011, 1:07 PM

## 2011-11-28 ENCOUNTER — Inpatient Hospital Stay (HOSPITAL_COMMUNITY): Payer: BC Managed Care – PPO | Admitting: Speech Pathology

## 2011-11-28 ENCOUNTER — Inpatient Hospital Stay (HOSPITAL_COMMUNITY): Payer: BC Managed Care – PPO | Admitting: Occupational Therapy

## 2011-11-28 ENCOUNTER — Inpatient Hospital Stay (HOSPITAL_COMMUNITY): Payer: BC Managed Care – PPO | Admitting: Physical Therapy

## 2011-11-28 MED ORDER — BOOST / RESOURCE BREEZE PO LIQD
1.0000 | ORAL | Status: DC
  Administered 2011-11-29 – 2011-12-22 (×15): 1 via ORAL

## 2011-11-28 NOTE — Progress Notes (Signed)
Nutrition Follow-up  Intervention:   1. Decrease Resource Breeze to daily 2. Offer appropriate alternatives it pt refuses meals 3. RD to continue to follow nutrition care plan  Assessment:   Advanced to Regular diet yesterday (9/16.) Intake has significantly improved, pt is consuming mostly 100% of meals.  Diet Order:  Regular Supplement: Resource Breeze PO BID  Meds: Scheduled Meds:    . amLODipine  10 mg Oral Daily  . antiseptic oral rinse  15 mL Mouth Rinse BID  . cloNIDine  0.1 mg Transdermal Weekly  . enoxaparin (LOVENOX) injection  40 mg Subcutaneous Q24H  . feeding supplement  1 Container Oral BID BM  . hydrochlorothiazide  25 mg Oral Daily  . lisinopril  10 mg Oral QHS  . lisinopril  20 mg Oral Daily  . methylphenidate  5 mg Oral BID WC  . multivitamin with minerals  1 tablet Oral Daily  . potassium chloride  10 mEq Oral Daily  . senna-docusate  1 tablet Oral BID   Continuous Infusions:  PRN Meds:.acetaminophen, bisacodyl, ondansetron (ZOFRAN) IV, ondansetron, polyethylene glycol, sorbitol  Labs:  CMP     Component Value Date/Time   NA 135 11/15/2011 0625   K 3.9 11/15/2011 0625   CL 96 11/15/2011 0625   CO2 28 11/15/2011 0625   GLUCOSE 103* 11/15/2011 0625   BUN 30* 11/15/2011 0625   CREATININE 1.19 11/15/2011 0625   CALCIUM 10.4 11/15/2011 0625   PROT 7.0 11/15/2011 0625   ALBUMIN 3.5 11/15/2011 0625   AST 18 11/15/2011 0625   ALT 38 11/15/2011 0625   ALKPHOS 58 11/15/2011 0625   BILITOT 0.5 11/15/2011 0625   GFRNONAA 65* 11/15/2011 0625   GFRAA 76* 11/15/2011 0625     Intake/Output Summary (Last 24 hours) at 11/28/11 1046 Last data filed at 11/28/11 0900  Gross per 24 hour  Intake   1080 ml  Output    500 ml  Net    580 ml  BM 9/13  Weight Status:  155 lb - wt trending down  Estimated needs:  1900 - 2000 kcal, 90 - 105 grams protein  Nutrition Dx:  Inadequate oral intake r/t decreased alertness AEB poor meal completion (<25%.) Resolved.  Goal: Pt to meet >/= 90% of  their estimated nutrition needs;  met  Monitor: weight trends, lab trends, I/O's, PO intake, supplement tolerance  Jarold Motto MS, RD, LDN Pager: [phone] After-hours pager: [phone]

## 2011-11-28 NOTE — Progress Notes (Signed)
Physical Therapy Weekly Progress Note  Patient Details  Name: Anthony Hardin MRN: [id number] Date of Birth: 01-Apr-1950  Today's Date: 11/28/2011 Time: 1330-1430 Time Calculation (min): 60 min  Patient continues to make steady progress towards PT LTG and has met 4 of 4 short term goals.  Patient continues to demonstrate improved attention to L, attention to task, initiation, sequencing and is currently min-mod A overall for bed mobility, basic transfers, w/c mobility in controlled environment and has begun gait training with RW with mod A for short distances.   Patient continues to demonstrate the following deficits: impaired cognition with continued impairments with initiation, awareness, attention, memory, problem solving, L sided hemiplegia, impaired coordination, motor control, timing, sequencing, apraxia, impaired balance, gait and therefore will continue to benefit from skilled PT intervention to enhance overall performance with balance, postural control, ability to compensate for deficits, functional use of  left upper extremity and left lower extremity, attention, awareness and coordination.  Patient progressing toward long term goals.  Continue plan of care.  PT Short Term Goals Week 2:  PT Short Term Goal 1 (Week 2): Patient will perform bed mobility on flat bed min A to L and R PT Short Term Goal 1 - Progress (Week 2): Met PT Short Term Goal 2 (Week 2): Patient will perform functional transfers to L and R with min A and mod-max verbal cues for initiation, sequence and to attend to L PT Short Term Goal 2 - Progress (Week 2): Met PT Short Term Goal 3 (Week 2): Patient will perform w/c mobility on unit x 150 with mod A and mod-max verbal and visual cues to attend to L environment PT Short Term Goal 3 - Progress (Week 2): Met PT Short Term Goal 4 (Week 2): Patient will perform gait training on unit x 75' with max A of one person with increased LLE advancement and activation of LLE in  stance. PT Short Term Goal 4 - Progress (Week 2): Met Week 3:  PT Short Term Goal 1 (Week 3): = LTG  Skilled Therapeutic Interventions/Progress Updates:   Patient performed bed mobility supine > sit with min A and one initiation cue.  Transfers bed > w/c <> mat with squat pivots with min A with verbal cues for hand placement, sequence and initiation.  W/c mobility on unit with supervision with improved visual scanning to L and able to continue propulsion during conversation to L side.  Pre gait and gait training with R and LLE step ups to 4" step with focus on LLE extensor activation in stance and minimize use of genu recurvatum for stability in stance and focus on simultaneous hip and knee flexion for LLE swing phase with bilat UE support; performed stair negotiation up and down 3 stairs x 2 reps with bilat UE support and mod-max A for full LLE advancement, placement and stabilization in stance when advancing RLE. Gait with +2A and use of RW/hand orthosis and ace wrap for DF assist x 40' with +2 A for safety and to allow facilitation of LLE advancement, placement and forward translation of COG over L stance LE and anterior pelvic rotation.  Patient attempting to verbalize gait sequence but LE reaction too delayed; attempted to increase gait speed for more automatic gait sequence.  NMR for LLE during L foot on soccer ball during roll out/in for isolated knee extension and flexion x 20 reps with verbal and visual cues for isolated and sustained activation and minimizing overflow and substitution with other muscles.  Therapy Documentation Precautions:  Precautions Precautions: Fall Precaution Comments: Lt hemiparesis and sensory deficits and Lt visual field neglect Restrictions Weight Bearing Restrictions: No Pain: Pain Assessment Pain Assessment: No/denies pain  See FIM for current functional status  Therapy/Group: Individual Therapy  [name] [facility] 11/28/2011, 3:42 PM

## 2011-11-28 NOTE — Progress Notes (Signed)
Occupational Therapy Session Note  Patient Details  Name: Anthony Hardin MRN: [id number] Date of Birth: 12-16-1950  Today's Date: 11/28/2011 Time: 0815-0900 Time Calculation (min): 45 min  Short Term Goals: Week 2:  OT Short Term Goal 1 (Week 2): Pt will perform grooming activities seated at sink w/ Mod Assist OT Short Term Goal 2 (Week 2): Pt will complete UB dressing with min assist with hemi-technique OT Short Term Goal 3 (Week 2): Pt will complete LB dressing with mod assist OT Short Term Goal 4 (Week 2): Pt will complete toilet transfer with mod assist in 2 out of 3 attempts  Skilled Therapeutic Interventions/Progress Updates:    Pt seen for ADL retraining with focus on squat pivot transfers, sit <> stand for LB bathing and dressing, and hand over hand assist to integrate LUE into self-care tasks.  Pt with increased initiation and sequencing with bathing.  Attempts to use LUE with increased autonomy for bathing tasks and increased spontaneous use of Lt hand.  Pt able to carry out 2-step tasks with bathing (ie putting soap on washcloth and washing) with only min verbal cues.  Pt continues to have difficulty with processing with donning shirt, requires max cues and setup assist to don shirt.  Pt demonstrating increased carryover and desire to participate with donning underwear and pants.  Pt able to don pants by crossing leg over knee and is demonstrating improvements with donning socks.  Steady assist at Lt foot to maintain across Rt leg while pt donned sock without physical assist, however pt required backward chaining assist with initiating sock on Rt foot and pt able to pull it up.    Therapy Documentation Precautions:  Precautions Precautions: Fall Precaution Comments: Lt hemiparesis and sensory deficits and Lt visual field neglect Restrictions Weight Bearing Restrictions: No General:   Vital Signs: Therapy Vitals BP: 128/80 mmHg Pain:  Pt with no c/o pain this  session.  See FIM for current functional status  Therapy/Group: Individual Therapy  Leonette Monarch 11/28/2011, 9:23 AM

## 2011-11-28 NOTE — Progress Notes (Signed)
Speech Language Pathology Daily Session Note  Patient Details  Name: Anthony Hardin MRN: [id number] Date of Birth: [birth year]  Today's Date: 11/28/2011 Time: 1200-1210 Time Calculation (min): 10 min  Short Term Goals: Week 2: SLP Short Term Goal 1 (Week 2): Patient will sustain attention to a basic familiar task for 2 minutes with mod multimodal cues.  SLP Short Term Goal 1 - Progress (Week 2): Progressing toward goal SLP Short Term Goal 2 (Week 2): Patient will demonstrate emergent awareness by asking for help when needed with max assist verbal cues.  SLP Short Term Goal 2 - Progress (Week 2): Progressing toward goal SLP Short Term Goal 3 (Week 2): Patient will solve basic familiar problems with mod assist multimodal cues. SLP Short Term Goal 3 - Progress (Week 2): Progressing toward goal SLP Short Term Goal 4 (Week 2): Patient will consume dys. 3 textures and thin liquids with supervision cues with no overt s/s of aspiration. SLP Short Term Goal 5 (Week 2): Patient will tolerate trials of regular textures with min assist to utilize compensatory swallowing strategies with no overt s/s of aspiration  Skilled Therapeutic Interventions: Co-treatment focused on dysphagia goals.  Patient consumed regular textures and thin liquids with supervision level verbal cues to perform lingual sweep and compensate for left anterior loss of food.  Patient demonstrated no overt s/s of aspiration.     FIM:  Comprehension Comprehension Mode: Auditory Comprehension: 5-Understands basic 90% of the time/requires cueing < 10% of the time Expression Expression Mode: Verbal Expression: 5-Expresses complex 90% of the time/cues < 10% of the time Social Interaction Social Interaction: 6-Interacts appropriately with others with medication or extra time (anti-anxiety, antidepressant). Problem Solving Problem Solving: 4-Solves basic 75 - 89% of the time/requires cueing 10 - 24% of the time Memory Memory:  4-Recognizes or recalls 75 - 89% of the time/requires cueing 10 - 24% of the time FIM - Eating Eating Activity: 5: Set-up assist for open containers;5: Set-up assist for cut food  Pain Pain Assessment Pain Assessment: No/denies pain  Therapy/Group: Group Therapy  Charlane Ferretti., CCC-SLP [phone]  [name] 11/28/2011, 1:16 PM

## 2011-11-28 NOTE — Progress Notes (Signed)
Occupational Therapy Session Note  Patient Details  Name: THADIUS SMISEK MRN: [id number] Date of Birth: 06-03-1950  Today's Date: 11/28/2011 Time: 1130-1200 Time Calculation (min): 30 min  Skilled Therapeutic Interventions/Progress Updates:    Pt seen in diners club today with focus on LUE as gross assist. Pt was noted to hold pepper packet in L hand & open w/ R. Pt was engaged in conversation and overall, used LUE as gross assist. He benefits from VC's to wipe left side of face at times secondary to decreased sensation.  Therapy Documentation Precautions:  Precautions Precautions: Fall Precaution Comments: Lt hemiparesis and sensory deficits and Lt visual field neglect Restrictions Weight Bearing Restrictions: No   Pain: Pain Assessment Pain Assessment: No/denies pain     See FIM for current functional status  Therapy/Group: Group Therapy  Charletta Cousin, Amy Beth Dixon 11/28/2011, 1:04 PM

## 2011-11-28 NOTE — Progress Notes (Signed)
Speech Language Pathology Daily Session Note  Patient Details  Name: Anthony Hardin MRN: [id number] Date of Birth: 02-Sep-1950  Today's Date: 11/28/2011 Time: 0935-1020 Time Calculation (min): 45 min  Short Term Goals: Week 2: SLP Short Term Goal 1 (Week 2): Patient will sustain attention to a basic familiar task for 2 minutes with mod multimodal cues.  SLP Short Term Goal 1 - Progress (Week 2): Progressing toward goal SLP Short Term Goal 2 (Week 2): Patient will demonstrate emergent awareness by asking for help when needed with max assist verbal cues.  SLP Short Term Goal 2 - Progress (Week 2): Progressing toward goal SLP Short Term Goal 3 (Week 2): Patient will solve basic familiar problems with mod assist multimodal cues. SLP Short Term Goal 3 - Progress (Week 2): Progressing toward goal SLP Short Term Goal 4 (Week 2): Patient will consume dys. 3 textures and thin liquids with supervision cues with no overt s/s of aspiration. SLP Short Term Goal 5 (Week 2): Patient will tolerate trials of regular textures with min assist to utilize compensatory swallowing strategies with no overt s/s of aspiration  Skilled Therapeutic Interventions: Treatment session focused on addressing cognitive goals. SLP facilitated session with increased processing/wait time, min assist verbal and visual cues to select attention to scanning tasks such as locating information on a menu and answering questions about them.  Patient also made a selection from the menu and copied the selection onto a piece of paper with mod assist visual and verbal cues to begin writing at the left borders of the page.       FIM:  Comprehension Comprehension Mode: Auditory Comprehension: 5-Understands basic 90% of the time/requires cueing < 10% of the time Expression Expression Mode: Verbal Expression: 5-Expresses complex 90% of the time/cues < 10% of the time Social Interaction Social Interaction: 6-Interacts appropriately with  others with medication or extra time (anti-anxiety, antidepressant). Problem Solving Problem Solving: 4-Solves basic 75 - 89% of the time/requires cueing 10 - 24% of the time Memory Memory: 4-Recognizes or recalls 75 - 89% of the time/requires cueing 10 - 24% of the time  Pain Pain Assessment Pain Assessment: No/denies pain  Therapy/Group: Individual Therapy  [name], [place]  Graduate Clinician Speech Language Pathology  Page, [name] 11/28/2011, 11:24 AM  The above skilled treatment note has been reviewed and SLP is in agreement. [name], M.A., CCC-SLP [phone]

## 2011-11-29 ENCOUNTER — Inpatient Hospital Stay (HOSPITAL_COMMUNITY): Payer: BC Managed Care – PPO | Admitting: Speech Pathology

## 2011-11-29 ENCOUNTER — Inpatient Hospital Stay (HOSPITAL_COMMUNITY): Payer: BC Managed Care – PPO | Admitting: Physical Therapy

## 2011-11-29 ENCOUNTER — Inpatient Hospital Stay (HOSPITAL_COMMUNITY): Payer: BC Managed Care – PPO | Admitting: Occupational Therapy

## 2011-11-29 NOTE — Progress Notes (Signed)
Occupational Therapy Weekly Progress Note and Treatment Session  Patient Details  Name: Anthony Hardin MRN: [id number] Date of Birth: 27-Sep-1950  Today's Date: 11/29/2011 Time: 0815-0902 Time Calculation (min): 47 min  Patient has met 3 of 4 short term goals.  Pt continues to make steady progress towards goals.  Pt is demonstrating increased attention to Lt, attention to task, initiation, and sequencing. Pt has progressed to min-mod assist with transfers and mod assist with bathing and dressing tasks.  Patient continues to demonstrate the following deficits: Lt hemiplegia, Lt inattention, decreased perception, decreased sensation, impaired coordination, decreased attention, memory, awareness, and problem solving, and therefore will continue to benefit from skilled OT intervention to enhance overall performance with BADL and Reduce care partner burden.  Patient progressing toward long term goals..  Continue plan of care.  OT Short Term Goals Week 2:  OT Short Term Goal 1 (Week 2): Pt will perform grooming activities seated at sink w/ Mod Assist OT Short Term Goal 1 - Progress (Week 2): Met OT Short Term Goal 2 (Week 2): Pt will complete UB dressing with min assist with hemi-technique OT Short Term Goal 2 - Progress (Week 2): Progressing toward goal OT Short Term Goal 3 (Week 2): Pt will complete LB dressing with mod assist OT Short Term Goal 3 - Progress (Week 2): Met OT Short Term Goal 4 (Week 2): Pt will complete toilet transfer with mod assist in 2 out of 3 attempts OT Short Term Goal 4 - Progress (Week 2): Met Week 3:  OT Short Term Goal 1 (Week 3): Pt will complete UB dressing with min assist with hemi-technique  OT Short Term Goal 2 (Week 3): Pt will complete grooming with min assist OT Short Term Goal 3 (Week 3): Pt will complete LB dressing with min assist OT Short Term Goal 4 (Week 3): Pt will complete toilet transfer with min assist 2 out of 3 attempts  Skilled Therapeutic  Interventions/Progress Updates:    Pt seen for ADL retraining with focus on increased safety with transfers and hemi-technique with bathing and dressing tasks.  Pt finishing breakfast on arrival.  Pt with carryover of routine with greeting this therapist by name and expressing desire to complete bathing and dressing.  Pt with increased spontaneous use of LUE with bathing, requiring physical support at Lt elbow to provide additional ROM and shoulder mobility.  Pt continues to require increased cues and physical assist with donning shirt, secondary to perceptual deficits.  Pt with increased ability to don underwear, pants, and socks by crossing feet over leg.    Therapy Documentation Precautions:  Precautions Precautions: Fall Precaution Comments: Lt hemiparesis and LUE sensory deficits, Lt visual neglect Restrictions Weight Bearing Restrictions: No General:   Vital Signs: Therapy Vitals BP: 120/80 mmHg Pain: Pain Assessment Pain Assessment: No/denies pain ADL: ADL Eating: Supervision/safety;Set up Where Assessed-Eating: Chair Grooming: Moderate assistance Where Assessed-Grooming: Sitting at sink;Wheelchair Upper Body Bathing: Minimal cueing;Minimal assistance Where Assessed-Upper Body Bathing: Shower Lower Body Bathing: Minimal cueing;Moderate assistance Where Assessed-Lower Body Bathing: Shower Upper Body Dressing: Moderate assistance Where Assessed-Upper Body Dressing: Sitting at sink Lower Body Dressing: Moderate assistance Where Assessed-Lower Body Dressing: Sitting at sink Toileting: Moderate assistance Where Assessed-Toileting: Teacher, adult education: Moderate assistance Toilet Transfer Method: Squat pivot Toilet Transfer Equipment: Engineer, technical sales: Not assessed Film/video editor: Moderate assistance Film/video editor Method: Warden/ranger: Transfer tub bench;Grab bars ADL Comments: Pt attending to LUE more in functional  tasks, continues  to have extreme sensory loss.  Pt using hemi-technique for bathing and LB dressing, continues to have perceptual difficulties with UB dressing  See FIM for current functional status  Therapy/Group: Individual Therapy  Leonette Monarch 11/29/2011, 10:54 AM

## 2011-11-29 NOTE — Progress Notes (Signed)
Speech Language Pathology Daily Session Note and Weekly Progress Note  Patient Details  Name: Anthony Hardin MRN: [id number] Date of Birth: 01-19-1951  Today's Date: 11/29/2011 Time: 130-230 Time Calculation (min): 60 min  Short Term Goals: Week 2: SLP Short Term Goal 1 (Week 2): Patient will sustain attention to a basic familiar task for 2 minutes with mod multimodal cues.  SLP Short Term Goal 1 - Progress (Week 2): Met SLP Short Term Goal 2 (Week 2): Patient will demonstrate emergent awareness by asking for help when needed with max assist verbal cues.  SLP Short Term Goal 2 - Progress (Week 2): Met SLP Short Term Goal 3 (Week 2): Patient will solve basic familiar problems with mod assist multimodal cues. SLP Short Term Goal 3 - Progress (Week 2): Met SLP Short Term Goal 4 (Week 2): Patient will consume dys. 3 textures and thin liquids with supervision cues with no overt s/s of aspiration. SLP Short Term Goal 4 - Progress (Week 2): Met SLP Short Term Goal 5 (Week 2): Patient will tolerate trials of regular textures with min assist to utilize compensatory swallowing strategies with no overt s/s of aspiration SLP Short Term Goal 5 - Progress (Week 2): Met  Skilled Therapeutic Interventions: Treatment targeted cognition goals.  SLP facilitated the session with min assist verbal cues to initiate a moderately complex functional task, and supervision cues to complete the task accurately and in a timely manner.  SLP also facilitated the session with supervision cues to completely scan a magazine article and answer questions for comprehension accurately.  SLP does not recommend follow up for reading comprehension at this time.  SLP also facilitated the session with mod to max assist verbal cuing to alternate attention during a card game where the patient was asked to hold the cards and attend to his left hand while also functionally problem solving.     FIM:  Comprehension Comprehension Mode:  Auditory Comprehension: 5-Understands basic 90% of the time/requires cueing < 10% of the time Expression Expression Mode: Verbal Expression: 5-Expresses basic 90% of the time/requires cueing < 10% of the time. Social Interaction Social Interaction: 5-Interacts appropriately 90% of the time - Needs monitoring or encouragement for participation or interaction. Problem Solving Problem Solving: 4-Solves basic 75 - 89% of the time/requires cueing 10 - 24% of the time Memory Memory: 4-Recognizes or recalls 75 - 89% of the time/requires cueing 10 - 24% of the time  Pain Pain Assessment Pain Assessment: No/denies pain  Therapy/Group: Individual Therapy  Speech Language Pathology Weekly Progress Note  Patient Details  Name: Anthony Hardin MRN: [id number] Date of Birth: [birth year]  Today's Date: 11/29/2011   Short Term Goals: Week 2: SLP Short Term Goal 1 (Week 2): Patient will sustain attention to a basic familiar task for 2 minutes with mod multimodal cues.  SLP Short Term Goal 1 - Progress (Week 2): Met SLP Short Term Goal 2 (Week 2): Patient will demonstrate emergent awareness by asking for help when needed with max assist verbal cues.  SLP Short Term Goal 2 - Progress (Week 2): Met SLP Short Term Goal 3 (Week 2): Patient will solve basic familiar problems with mod assist multimodal cues. SLP Short Term Goal 3 - Progress (Week 2): Met SLP Short Term Goal 4 (Week 2): Patient will consume dys. 3 textures and thin liquids with supervision cues with no overt s/s of aspiration. SLP Short Term Goal 4 - Progress (Week 2): Met SLP Short Term Goal 5 (Week  2): Patient will tolerate trials of regular textures with min assist to utilize compensatory swallowing strategies with no overt s/s of aspiration SLP Short Term Goal 5 - Progress (Week 2): Met  Weekly Progress Updates: Patient met 5 out of 5 short term goals this reporting period with gains in dysphagia management and attention.   Patient  was upgraded to regular textures and thin liquids with intermittent supervision.   Patient also increased his ability to selectively attend to functional tasks for a period of forty minutes with supervision cues to initiate, transition, and complete task in a timely manner.   Patient also exhibits emergent awareness with mod to max assist to ask for help with basic functional tasks when appropriate.  Patient continues to improve problem solving with mod assist multimodal cues during basic functional tasks.  Patient will continue to benefit from intensive speech therapy to maximize functional independence and safety with basic ADLs and reduce burden of care upon discharge.     SLP Frequency: 1-2 X/day, 30-60 minutes;5 out of 7 days Estimated Length of Stay: 4 weeks SLP Treatment/Interventions: Cognitive remediation/compensation;Cueing hierarchy;Environmental controls;Functional tasks;Multimodal communication approach;Internal/external aids;Patient/family education;Therapeutic Activities  [name], [place]  Graduate Clinician Speech Language Pathology  Page, [name] 11/29/2011, 5:19 PM  The above skilled treatment note and weekly progress update has been reviewed and SLP is in agreement. [name], M.A., CCC-SLP [phone]

## 2011-11-29 NOTE — Patient Care Conference (Signed)
Inpatient RehabilitationTeam Conference Note Date: 11/29/2011   Time: 10:45 AM    Patient Name: Anthony Hardin      Medical Record Number: [id number]  Date of Birth: [birth year] Sex: Male         Room/Bed: 4025/4025-01 Payor Info: Payor: BLUE CROSS BLUE SHIELD  Plan: [facility] [employer] PPO  Product Type: *No Product type*     Admitting Diagnosis: RT HEMM   Admit Date/Time:  11/14/2011  5:35 PM Admission Comments: No comment available   Primary Diagnosis:  Thalamic hemorrhage Principal Problem: Thalamic hemorrhage  Patient Active Problem List   Diagnosis Date Noted  . 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 Intracerebral hemorrhage 10/31/2011  . 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 Hemiplegia, unspecified, affecting nondominant side 10/31/2011    Expected Discharge Date: Expected Discharge Date: 12/12/11  Team Members Present: Physician: Dr. Claudette Laws Social Worker Present: Dossie Der, LCSW Nurse Present: Rosalio Macadamia, RN PT Present: [name], PT OT Present: Bretta Bang, Verlene Mayer, OT SLP Present: [name], SLP     Current Status/Progress Goal Weekly Team Focus  Medical   Hemisensory neglect with somewhat better awareness of deficits. 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  Initiate discharge planning   Bowel/Bladder   continent of bowel with senna and bladder  continent of B+B  without medications  monitor effectiveness without senna   Swallow/Nutrition/ Hydration   regular textures and thin liquids, intermittent supervision  modified independence  increase alternating attention, facilitate carryover of strategies   ADL's   mod assist bathing and UB dressing, mod-min assist LB dressing, min assist grooming, min-mod assist transfers  min assist overall  Lt attention, sustained attention to tasks, increased Lt integration in self-care tasks   Mobility   min-mod A overall  supervision-min A overall    initiation/sequencing and attention to L with tranfers, w/c mobility, gait with RW   Communication             Safety/Cognition/ Behavioral Observations  mod assist  supervision-min assist  increase alternating attention, self monitoring and self correcting   Pain   n/a  n/a  n/a   Skin   n/a  n/a  n/a      *See Interdisciplinary Assessment and Plan and progress notes for long and short-term goals  Barriers to Discharge: No 24-hour care available locally    Possible Resolutions to Barriers:  Determine SNF versus 24/ 7 care in [place]    Discharge Planning/Teaching Needs:  Pt and family decisding upon discharge plan-probable NHP, then after there home with a family member.      Team Discussion:  Making progress, increased awareness/attention.  BP good.  Progressing toward goals, pt wants to stay in [place] rather go to [place] upon discharge.   Revisions to Treatment Plan:  Possibly short term NHP upon discharge from rehab   Continued Need for Acute Rehabilitation Level of Care: The patient requires daily medical management by a physician with specialized training in physical medicine and rehabilitation for the following conditions: Daily direction of a multidisciplinary physical rehabilitation program to ensure safe treatment while eliciting the highest outcome that is of practical value to the patient.: Yes Daily medical management of patient stability for increased activity during participation in an intensive rehabilitation regime.: Yes Daily analysis of laboratory values and/or radiology reports with any subsequent need for medication adjustment of medical intervention for : Neurological problems  [name], [name] 11/29/2011, 2:01 PM

## 2011-11-29 NOTE — Progress Notes (Signed)
Physical Therapy Session Note  Patient Details  Name: Anthony Hardin MRN: [id number] Date of Birth: 1950/07/17  Today's Date: 11/29/2011 Time: 1430-1530 Time Calculation (min): 60 min  Short Term Goals: Week 2:  PT Short Term Goal 1 (Week 2): Patient will perform bed mobility on flat bed min A to L and R PT Short Term Goal 1 - Progress (Week 2): Met PT Short Term Goal 2 (Week 2): Patient will perform functional transfers to L and R with min A and mod-max verbal cues for initiation, sequence and to attend to L PT Short Term Goal 2 - Progress (Week 2): Met PT Short Term Goal 3 (Week 2): Patient will perform w/c mobility on unit x 150 with mod A and mod-max verbal and visual cues to attend to L environment PT Short Term Goal 3 - Progress (Week 2): Met PT Short Term Goal 4 (Week 2): Patient will perform gait training on unit x 75' with max A of one person with increased LLE advancement and activation of LLE in stance. PT Short Term Goal 4 - Progress (Week 2): Met Week 3:  PT Short Term Goal 1 (Week 3): = LTG  Therapy Documentation Precautions:  Precautions Precautions: Fall Precaution Comments: Lt hemiparesis and LUE sensory deficits, Lt visual neglect Restrictions Weight Bearing Restrictions: No Pain: Pain Assessment Pain Assessment: No/denies pain Locomotion : Wheelchair Mobility Distance: 150 with supervision still with intermittent verbal and visual cues for attention to L environment for obstacle negotiation  Transfers mat <> w/c with squat pivot with min-mod A with verbal and visual cues for hand placement, safe sequence, and initiation; sit > supine on mat with min A and supine > prone with min A. Other Treatments: Treatments Neuromuscular Facilitation: Left;Upper Extremity;Lower Extremity;Forced use;Activity to increase coordination;Activity to increase motor control;Activity to increase timing and sequencing;Activity to increase sustained activation in prone with use of  mirror for visual feedback for initiation, activation of L hamstring concentric, isometric and eccentric; in quadruped with focus on WB and attention and sustained activation of LUE triceps and LLE hip and knee muscles for stability during RUE reaching out of BOS; tall kneeling on mat with bilat UE support during tall kneeling squats with bilat UE >> tall kneeling squats with RUE reaching up and forwards out of BOS for increased activation of LLE hamstring and glute with mod A overall.   See FIM for current functional status  Therapy/Group: Individual Therapy  [name] [facility] 11/29/2011, 4:44 PM

## 2011-11-29 NOTE — Progress Notes (Signed)
CRITICAL VALUE ALERT  Critical value received:  positive DVT in left calve  Date of notification:  11/29/11  Time of notification:  1600  Critical value read back:yes  Nurse who received alert:  Ethelene Browns  MD notified (1st page):  Marissa Nestle PA  Time of first page:  1600  MD notified (2nd page):  Time of second page:  Responding MD:  Marissa Nestle PA  Time MD responded:

## 2011-11-29 NOTE — Progress Notes (Signed)
Patient ID: Anthony Hardin, male   DOB: 1950/07/19, 61 y.o.   MRN: [id number] Admitted 10/31/2011 with diffuse weakness and unable to move and fell out of bed onto the floor. MRI of the brain showed a 4 x 1 x 2.5 cm acute hematoma right thalamus with mass effect and displacement distortion of the third ventricle but no suspicion of obstructive hydrocephalus. MRA of the head with no stenosis or occlusion.   Subjective/Complaints:  Slept well, no daytime somnolence No other c/os  No abd pain " I think I can transfer to the bathroom by next week, independently"  Review of Systems  Musculoskeletal: Negative for myalgias.  Neurological: Positive for sensory change and focal weakness.  All other systems reviewed and are negative.   Objective: Vital Signs: Blood pressure 125/77, pulse 57, temperature 98.5 F (36.9 C), temperature source Oral, resp. rate 16, height 5\' 9"  (1.753 m), weight 72.4 kg (159 lb 9.8 oz), SpO2 97.00%. No results found. No results found for this or any previous visit (from the past 72 hour(s)).   HEENT: normal Cardio: RRR Resp: CTA B/L GI: BS positive Extremity:  No Edema Skin:   Intact Neuro: Confused, Abnormal Sensory absent sensation L side lt touch and proprio, Abnormal Motor 2/5 finger flex and arm ext, 2/5 hip/knee extensory synergy,0/5 L Ankle Abnormal FMC Reflexes 3+, Reflexes: 3+, Tone:  Hypertonia, Inattention and Other L homonymous hemianopsia Musc/Skel:  Normal Better visuospatial awareness on the left. Much more clear and focused this am with thought processing Sensation absent L arm and leg Assessment/Plan: 1. Functional deficits secondary to R thalamic bleed which require 3+ hours per day of interdisciplinary therapy in a comprehensive inpatient rehab setting. Physiatrist is providing close team supervision and 24 hour management of active medical problems listed below. Physiatrist and rehab team continue to assess barriers to discharge/monitor  patient progress toward functional and medical goals. FIM: FIM - Bathing Bathing Steps Patient Completed: Chest;Left Arm;Abdomen;Front perineal area;Right upper leg;Left upper leg;Left lower leg (including foot) Bathing: 3: Mod-Patient completes 5-7 50f 10 parts or 50-74%  FIM - Upper Body Dressing/Undressing Upper body dressing/undressing steps patient completed: Put head through opening of pull over shirt/dress Upper body dressing/undressing: 2: Max-Patient completed 25-49% of tasks FIM - Lower Body Dressing/Undressing Lower body dressing/undressing steps patient completed: Thread/unthread right underwear leg;Thread/unthread left underwear leg;Pull underwear up/down;Thread/unthread right pants leg;Thread/unthread left pants leg;Pull pants up/down Lower body dressing/undressing: 3: Mod-Patient completed 50-74% of tasks  FIM - Toileting Toileting steps completed by patient: Adjust clothing prior to toileting;Adjust clothing after toileting Toileting Assistive Devices: Grab bar or rail for support Toileting: 3: Mod-Patient completed 2 of 3 steps  FIM - Diplomatic Services operational officer Devices: Grab bars Toilet Transfers: 3-To toilet/BSC: Mod A (lift or lower assist);4-From toilet/BSC: Min A (steadying Pt. > 75%)  FIM - Bed/Chair Transfer Bed/Chair Transfer Assistive Devices: Arm rests Bed/Chair Transfer: 4: Supine > Sit: Min A (steadying Pt. > 75%/lift 1 leg);4: Bed > Chair or W/C: Min A (steadying Pt. > 75%);4: Chair or W/C > Bed: Min A (steadying Pt. > 75%)  FIM - Locomotion: Wheelchair Distance: 150 Locomotion: Wheelchair: 5: Travels 150 ft or more: maneuvers on rugs and over door sills with supervision, cueing or coaxing FIM - Locomotion: Ambulation Locomotion: Ambulation Assistive Devices: Designer, industrial/product Ambulation/Gait Assistance: 1: +2 Total assist Locomotion: Ambulation: 1: Two helpers  Comprehension Comprehension Mode: Auditory Comprehension: 5-Understands  complex 90% of the time/Cues < 10% of the time  Expression Expression  Mode: Verbal Expression: 5-Expresses basic 90% of the time/requires cueing < 10% of the time.  Social Interaction Social Interaction: 6-Interacts appropriately with others with medication or extra time (anti-anxiety, antidepressant).  Problem Solving Problem Solving: 4-Solves basic 75 - 89% of the time/requires cueing 10 - 24% of the time  Memory Memory: 4-Recognizes or recalls 75 - 89% of the time/requires cueing 10 - 24% of the time  2. Anticoagulation/DVT prophylaxis with non-Pharmaceutical:  Medical Problem List and Plan:  1. 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 DVT Prophylaxis/Anticoagulation: Subcutaneous Lovenox initiated 10/31/2011. Monitor platelet counts and any signs of bleeding  3. Mood: Continue Ritalin, now is on bid. Sleep-wake cycles have normalized 4. Neuropsych: This patient is not capable of making decisions on his/her own behalf at this time.He has very poor awareness of deficits and grossly overestimates his physical and cognitive abilities  5. Hypertension. Norvasc 10 mg daily, clonidine patch 0.1 mg weekly, hydrochlorothiazide 25 mg daily, lisinopril 20 mg a.m. and 10 mg at bedtime. Monitor with increased mobility  6. Dysphagia. Mechanical soft diet thin liquids. Speech therapy followup. Monitor for any signs of aspiration.     LOS (Days) 15 A FACE TO FACE EVALUATION WAS PERFORMED  KIRSTEINS,ANDREW E 11/29/2011, 8:06 AM

## 2011-11-29 NOTE — Progress Notes (Signed)
Social Work Patient ID: Anthony Hardin, male   DOB: [birth year], 61 y.o.   MRN: [id number] Update sent to [employer] for weekly update.

## 2011-11-29 NOTE — Progress Notes (Signed)
Speech Language Pathology Daily Session Note  Patient Details  Name: Anthony Hardin MRN: [id number] Date of Birth: [birth year]  Today's Date: 11/29/2011 Time: 4098-1191 Time Calculation (min): 25 min  Short Term Goals: Week 2: SLP Short Term Goal 1 (Week 2): Patient will sustain attention to a basic familiar task for 2 minutes with mod multimodal cues.  SLP Short Term Goal 1 - Progress (Week 2): Progressing toward goal SLP Short Term Goal 2 (Week 2): Patient will demonstrate emergent awareness by asking for help when needed with max assist verbal cues.  SLP Short Term Goal 2 - Progress (Week 2): Progressing toward goal SLP Short Term Goal 3 (Week 2): Patient will solve basic familiar problems with mod assist multimodal cues. SLP Short Term Goal 3 - Progress (Week 2): Progressing toward goal SLP Short Term Goal 4 (Week 2): Patient will consume dys. 3 textures and thin liquids with supervision cues with no overt s/s of aspiration. SLP Short Term Goal 5 (Week 2): Patient will tolerate trials of regular textures with min assist to utilize compensatory swallowing strategies with no overt s/s of aspiration  Skilled Therapeutic Interventions: Co-treatment session with OT; SLP facilitated session with min assist verbal cues to self-monitor and compensate for left anterior loss of food.  Patient also initiated conversation with SLP regarding discharge planning and required max assist to verbally reason due to anticipatory awareness deficits.    FIM:  FIM - Eating Eating Activity: 5: Supervision/cues;5: Set-up assist for open containers  Pain Pain Assessment Pain Assessment: No/denies pain  Therapy/Group: Group Therapy  Charlane Ferretti., CCC-SLP [phone]  [name] 11/29/2011, 1:16 PM

## 2011-11-29 NOTE — Progress Notes (Signed)
Social Work Patient ID: Anthony Hardin, male   DOB: 27-Mar-1950, 61 y.o.   MRN: [id number] Conference call with sister and brother of pt along with pt to inform progression toward goals.  All pleased with how well pt has made progress and are Encouraged.  Discussing next step which is probable short term NHP.  Pt wants to stay in [place] and not go to [place] upon discharge from here. Sister coming down on Friday.  All pleased with progress and will continue to work on discharge plans.

## 2011-11-29 NOTE — Progress Notes (Signed)
Occupational Therapy Session Note  Patient Details  Name: Anthony Hardin MRN: [id number] Date of Birth: 1950-10-19  Today's Date: 11/29/2011 Time: 1130-1145 Time Calculation (min): 15 min  Skilled Therapeutic Interventions/Progress Updates: Patient participated in [employer] today to address his visual scanning to left environment, as well as to address oral motor control with management of foods outside of oral cavity.  Patient with significant sensory loss, and unable to feel face on left.  Patient needing intermittent cueing to manage food.Patient able to verbalize that SLP has helped him over time to eat at a slower rate, and take smaller bites.     Therapy Documentation Precautions:  Precautions Precautions: Fall Precaution Comments: Lt hemiparesis and LUE sensory deficits, Lt visual neglect Restrictions Weight Bearing Restrictions: No   Pain: Pain Assessment Pain Assessment: No/denies pain  See FIM for current functional status  Therapy/Group: Group Therapy  Collier Salina 11/29/2011, 2:22 PM

## 2011-11-30 ENCOUNTER — Inpatient Hospital Stay (HOSPITAL_COMMUNITY): Payer: BC Managed Care – PPO | Admitting: Occupational Therapy

## 2011-11-30 ENCOUNTER — Inpatient Hospital Stay (HOSPITAL_COMMUNITY): Payer: BC Managed Care – PPO | Admitting: Physical Therapy

## 2011-11-30 ENCOUNTER — Inpatient Hospital Stay (HOSPITAL_COMMUNITY): Payer: BC Managed Care – PPO | Admitting: Speech Pathology

## 2011-11-30 DIAGNOSIS — Z5189 Encounter for other specified aftercare: Secondary | ICD-10-CM

## 2011-11-30 DIAGNOSIS — G8110 Spastic hemiplegia affecting unspecified side: Secondary | ICD-10-CM

## 2011-11-30 DIAGNOSIS — I619 Nontraumatic intracerebral hemorrhage, unspecified: Secondary | ICD-10-CM

## 2011-11-30 NOTE — Progress Notes (Signed)
Physical Therapy Session Note  Patient Details  Name: Anthony Hardin MRN: [id number] Date of Birth: 07/10/1950  Today's Date: 11/30/2011 Time: 4098-1191 Time Calculation (min): 45 min  Short Term Goals: Week 2:  PT Short Term Goal 1 (Week 2): Patient will perform bed mobility on flat bed min A to L and R PT Short Term Goal 1 - Progress (Week 2): Met PT Short Term Goal 2 (Week 2): Patient will perform functional transfers to L and R with min A and mod-max verbal cues for initiation, sequence and to attend to L PT Short Term Goal 2 - Progress (Week 2): Met PT Short Term Goal 3 (Week 2): Patient will perform w/c mobility on unit x 150 with mod A and mod-max verbal and visual cues to attend to L environment PT Short Term Goal 3 - Progress (Week 2): Met PT Short Term Goal 4 (Week 2): Patient will perform gait training on unit x 75' with max A of one person with increased LLE advancement and activation of LLE in stance. PT Short Term Goal 4 - Progress (Week 2): Met Week 3:  PT Short Term Goal 1 (Week 3): = LTG  Therapy Documentation Precautions:  Precautions Precautions: Fall Precaution Comments: Lt hemiparesis and LUE sensory deficits, Lt visual neglect Restrictions Weight Bearing Restrictions: No Vital Signs: Therapy Vitals Temp: 98.7 F (37.1 C) Temp src: Oral Pulse Rate: 61  Resp: 19  BP: 131/80 mmHg Patient Position, if appropriate: Lying Oxygen Therapy SpO2: 98 % O2 Device: None (Room air) Pain: Pain Assessment Pain Assessment: No/denies pain Locomotion : Wheelchair Mobility Distance: 150   Gait training x 40' with use of RW with hand orthosis in controlled environment with use of mirror in front of patient for increased visual feedback of upright trunk position, LLE swing, placement and WB stance through LLE with decreased assistance needed today for LLE advancement and no episodes of dragging L foot behind patient and no episodes of L knee buckling. Other  Treatments: Treatments Neuromuscular Facilitation: Left;Lower Extremity;Forced use;Activity to increase timing and sequencing;Activity to increase motor control;Activity to increase coordination;Activity to increase sustained activation;Activity to increase lateral weight shifting;Activity to increase anterior-posterior weight shifting on [place] in sitting during alternating LE extensions at 30 cm/sec with focus on maintaining upright trunk control and in standing x 2 reps with focus on lateral weight shifting with LE extension while maintaining upright trunk posture and prevention of pushing through RUE.  NMR/pre gait training in standing with LLE toe taps to step with focus on isolated hip and knee flexion to bring foot onto and off of step still with significant overflow to LUE and posterior leaning.  See FIM for current functional status  Therapy/Group: Individual Therapy  [name] [facility] 11/30/2011, 4:39 PM

## 2011-11-30 NOTE — Progress Notes (Signed)
Speech Language Pathology Daily Session Note  Patient Details  Name: Anthony Hardin MRN: [id number] Date of Birth: 1950-12-25  Today's Date: 11/30/2011 Time: 1200-1210 Time Calculation (min): 10 min  Short Term Goals: Week 3: SLP Short Term Goal 1 (Week 3): Patient will sustain attention to a basic familiar task for 5 minutes with min multimodal cues. SLP Short Term Goal 2 (Week 3): Patient will demonstrate emergent awareness by asking for help when needed with mod assist verbal cues. SLP Short Term Goal 3 (Week 3): Patient will solve basic familiar problems with min assist multimodal cues. SLP Short Term Goal 4 (Week 3): Patient will consume regular textures and thin liquids with supervision cues to utilize compensatory strategies with no overt s/s of aspiration.  Skilled Therapeutic Interventions: Co-treatment focused on dysphagia goals.  Patient consumed regular textures and thin liquids with supervision level verbal cues to perform lingual sweep and compensate for left anterior loss of food.  Patient demonstrated no overt s/s of aspiration.  SLP facilitated session with min assist verbal cues to self monitor and compensate for left anterior loss of solids.   FIM:  Comprehension Comprehension Mode: Auditory Comprehension: 5-Understands complex 90% of the time/Cues < 10% of the time Expression Expression Mode: Verbal Expression: 5-Expresses complex 90% of the time/cues < 10% of the time Social Interaction Social Interaction: 5-Interacts appropriately 90% of the time - Needs monitoring or encouragement for participation or interaction. Problem Solving Problem Solving: 4-Solves basic 75 - 89% of the time/requires cueing 10 - 24% of the time Memory Memory: 4-Recognizes or recalls 75 - 89% of the time/requires cueing 10 - 24% of the time FIM - Eating Eating Activity: 5: Supervision/cues;5: Set-up assist for open containers  Pain Pain Assessment Pain Assessment: No/denies  pain  Therapy/Group: Group Therapy  Charlane Ferretti., CCC-SLP [phone]  Anthony Hardin 11/30/2011, 1:28 PM

## 2011-11-30 NOTE — Progress Notes (Signed)
Occupational Therapy Session Note  Patient Details  Name: Anthony Hardin MRN: [id number] Date of Birth: 1950/10/02  Today's Date: 11/30/2011 Time: 0930-1025 Time Calculation (min): 55 min  Short Term Goals: Week 3:  OT Short Term Goal 1 (Week 3): Pt will complete UB dressing with min assist with hemi-technique  OT Short Term Goal 2 (Week 3): Pt will complete grooming with min assist OT Short Term Goal 3 (Week 3): Pt will complete LB dressing with min assist OT Short Term Goal 4 (Week 3): Pt will complete toilet transfer with min assist 2 out of 3 attempts  Skilled Therapeutic Interventions/Progress Updates:    Session 1) Pt seen for individual OT treatment session this am with focus on bath/dress at shower level, functional transfers, hemi dressing techniques and neuromuscular re-education L UE. Pt was noticed to demonstrate increased awareness L side as well as spontaneous use of LUE with bathing (especially when given support at his elbow). Pt performed shaving w/ electric razor in standing at sink w/ focus on balance while requiring min A noted. He benefits from VC's to make sure LLE/foot is flat on floor prior to transfers.  Session 2) 11:30-12pm Pt was seen in [employer] today with focus on visual scanning and neuro muscular re-education LUE during eating tasks. Pt was given Mod A with adaptive/built up fork and support at elbow for approximately 1/2 of his meal. Decreased sensation remains a factor when eating and he benefits from VC's to wipe left side of his face.  Therapy Documentation Precautions:  Precautions Precautions: Fall Precaution Comments: Lt hemiparesis and LUE sensory deficits, Lt visual neglect Restrictions Weight Bearing Restrictions: No     Pain: Pt w/o c/o pain this session    :   See FIM for current functional status  Therapy/Group: Individual Therapy  [name] 11/30/2011, 1:19 PM

## 2011-11-30 NOTE — Progress Notes (Signed)
Patient ID: Anthony Hardin, male   DOB: 1950/12/27, 61 y.o.   MRN: [id number] Admitted 10/31/2011 with diffuse weakness and unable to move and fell out of bed onto the floor. MRI of the brain showed a 4 x 1 x 2.5 cm acute hematoma right thalamus with mass effect and displacement distortion of the third ventricle but no suspicion of obstructive hydrocephalus. MRA of the head with no stenosis or occlusion.   Subjective/Complaints:  Slept well, no daytime somnolence No other c/os  Talking about long term and intermediate goals Poor awareness of LUE, hitting hand on tray table without noticing  Review of Systems  Musculoskeletal: Negative for myalgias.  Neurological: Positive for sensory change and focal weakness.  All other systems reviewed and are negative.   Objective: Vital Signs: Blood pressure 121/75, pulse 61, temperature 98.5 F (36.9 C), temperature source Oral, resp. rate 17, height 5\' 9"  (1.753 m), weight 72.4 kg (159 lb 9.8 oz), SpO2 97.00%. No results found. No results found for this or any previous visit (from the past 72 hour(s)).   HEENT: normal Cardio: RRR Resp: CTA B/L GI: BS positive Extremity:  No Edema Skin:   Intact Neuro: Confused, Abnormal Sensory absent sensation L side lt touch and proprio, Abnormal Motor 2/5 finger flex and arm ext, 2/5 hip/knee extensory synergy,0/5 L Ankle Abnormal FMC Reflexes 3+, Reflexes: 3+, Tone:  Hypertonia, Inattention and Other L homonymous hemianopsia Musc/Skel:  Normal Better visuospatial awareness on the left. Much more clear and focused this am with thought processing Sensation absent L arm and leg Assessment/Plan: 1. Functional deficits secondary to R thalamic bleed which require 3+ hours per day of interdisciplinary therapy in a comprehensive inpatient rehab setting. Physiatrist is providing close team supervision and 24 hour management of active medical problems listed below. Physiatrist and rehab team continue to assess  barriers to discharge/monitor patient progress toward functional and medical goals. FIM: FIM - Bathing Bathing Steps Patient Completed: Chest;Left Arm;Abdomen;Front perineal area;Buttocks;Right upper leg;Left upper leg Bathing: 3: Mod-Patient completes 5-7 4f 10 parts or 50-74%  FIM - Upper Body Dressing/Undressing Upper body dressing/undressing steps patient completed: Thread/unthread right sleeve of pullover shirt/dresss;Put head through opening of pull over shirt/dress;Pull shirt over trunk Upper body dressing/undressing: 3: Mod-Patient completed 50-74% of tasks FIM - Lower Body Dressing/Undressing Lower body dressing/undressing steps patient completed: Thread/unthread right underwear leg;Thread/unthread left underwear leg;Pull underwear up/down;Thread/unthread right pants leg;Pull pants up/down Lower body dressing/undressing: 3: Mod-Patient completed 50-74% of tasks  FIM - Toileting Toileting steps completed by patient: Adjust clothing prior to toileting;Adjust clothing after toileting Toileting Assistive Devices: Grab bar or rail for support Toileting: 3: Mod-Patient completed 2 of 3 steps  FIM - Diplomatic Services operational officer Devices: Grab bars Toilet Transfers: 3-To toilet/BSC: Mod A (lift or lower assist);4-From toilet/BSC: Min A (steadying Pt. > 75%)  FIM - Bed/Chair Transfer Bed/Chair Transfer Assistive Devices: Arm rests Bed/Chair Transfer: 3: Supine > Sit: Mod A (lifting assist/Pt. 50-74%/lift 2 legs;4: Sit > Supine: Min A (steadying pt. > 75%/lift 1 leg);3: Bed > Chair or W/C: Mod A (lift or lower assist);3: Chair or W/C > Bed: Mod A (lift or lower assist)  FIM - Locomotion: Wheelchair Distance: 150 Locomotion: Wheelchair: 5: Travels 150 ft or more: maneuvers on rugs and over door sills with supervision, cueing or coaxing FIM - Locomotion: Ambulation Locomotion: Ambulation Assistive Devices: Designer, industrial/product Ambulation/Gait Assistance: 1: +2 Total  assist Locomotion: Ambulation: 0: Activity did not occur  Comprehension Comprehension Mode: Auditory  Comprehension: 5-Understands complex 90% of the time/Cues < 10% of the time  Expression Expression Mode: Verbal Expression: 5-Expresses complex 90% of the time/cues < 10% of the time  Social Interaction Social Interaction: 5-Interacts appropriately 90% of the time - Needs monitoring or encouragement for participation or interaction.  Problem Solving Problem Solving: 4-Solves basic 75 - 89% of the time/requires cueing 10 - 24% of the time  Memory Memory: 4-Recognizes or recalls 75 - 89% of the time/requires cueing 10 - 24% of the time  2. Anticoagulation/DVT prophylaxis with non-Pharmaceutical:  Medical Problem List and Plan:  1. 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 DVT Prophylaxis/Anticoagulation: Subcutaneous Lovenox initiated 10/31/2011. Monitor platelet counts and any signs of bleeding  3. Mood: Continue Ritalin, now is on bid. Sleep-wake cycles have normalized 4. Neuropsych: This patient is not capable of making decisions on his/her own behalf at this time.He has very poor awareness of deficits and grossly overestimates his physical and cognitive abilities  5. Hypertension. Norvasc 10 mg daily, clonidine patch 0.1 mg weekly, hydrochlorothiazide 25 mg daily, lisinopril 20 mg a.m. and 10 mg at bedtime. Monitor with increased mobility  6. Dysphagia. Mechanical soft diet thin liquids. Speech therapy followup. Monitor for any signs of aspiration.     LOS (Days) 16 A FACE TO FACE EVALUATION WAS PERFORMED  Onur Mori E 11/30/2011, 8:31 AM

## 2011-11-30 NOTE — Progress Notes (Signed)
Speech Language Pathology Daily Session Note  Patient Details  Name: Anthony Hardin MRN: [id number] Date of Birth: May 13, 1950  Today's Date: 11/30/2011 Time: 1191-4782 Time Calculation (min): 45 min  Short Term Goals: Week 3: SLP Short Term Goal 1 (Week 3): Patient will sustain attention to a basic familiar task for 5 minutes with min multimodal cues. SLP Short Term Goal 2 (Week 3): Patient will demonstrate emergent awareness by asking for help when needed with mod assist verbal cues. SLP Short Term Goal 3 (Week 3): Patient will solve basic familiar problems with min assist multimodal cues. SLP Short Term Goal 4 (Week 3): Patient will consume regular textures and thin liquids with supervision cues to utilize compensatory strategies with no overt s/s of aspiration.  Skilled Therapeutic Interventions: Treatment targeted cognition goals. SLP facilitated the session with min assist verbal cuing to alternate attention in a distracting environment while playing a card game where the patient was asked to hold the cards and attend to his left hand while also functionally problem solving.  The patient also demonstrated improved sequencing and organization during a visual perceptual task with min assist verbal cuing.     FIM:  Comprehension Comprehension Mode: Auditory Comprehension: 5-Understands complex 90% of the time/Cues < 10% of the time Expression Expression Mode: Verbal Expression: 5-Expresses complex 90% of the time/cues < 10% of the time Social Interaction Social Interaction: 5-Interacts appropriately 90% of the time - Needs monitoring or encouragement for participation or interaction. Problem Solving Problem Solving: 4-Solves basic 75 - 89% of the time/requires cueing 10 - 24% of the time Memory Memory: 4-Recognizes or recalls 75 - 89% of the time/requires cueing 10 - 24% of the time FIM - Eating Eating Activity: 5: Set-up assist for open containers;5:  Supervision/cues  Pain Pain Assessment Pain Assessment: No/denies pain  Therapy/Group: Individual Therapy  [name], Conrad [place]  Graduate Clinician Speech Language Pathology  Page, [name] 11/30/2011, 4:26 PM  The above skilled treatment note has been reviewed and SLP is in agreement. [name], M.A., CCC-SLP [phone]

## 2011-12-01 ENCOUNTER — Inpatient Hospital Stay (HOSPITAL_COMMUNITY): Payer: BC Managed Care – PPO | Admitting: Physical Therapy

## 2011-12-01 ENCOUNTER — Inpatient Hospital Stay (HOSPITAL_COMMUNITY): Payer: BC Managed Care – PPO | Admitting: *Deleted

## 2011-12-01 ENCOUNTER — Inpatient Hospital Stay (HOSPITAL_COMMUNITY): Payer: BC Managed Care – PPO | Admitting: Speech Pathology

## 2011-12-01 ENCOUNTER — Encounter (HOSPITAL_COMMUNITY): Payer: BC Managed Care – PPO | Admitting: Occupational Therapy

## 2011-12-01 NOTE — Progress Notes (Signed)
Patient ID: Anthony Hardin, male   DOB: 09-02-1950, 61 y.o.   MRN: [id number] Admitted 10/31/2011 with diffuse weakness and unable to move and fell out of bed onto the floor. MRI of the brain showed a 4 x 1 x 2.5 cm acute hematoma right thalamus with mass effect and displacement distortion of the third ventricle but no suspicion of obstructive hydrocephalus. MRA of the head with no stenosis or occlusion.   Subjective/Complaints: Sister is here this weekend to discuss f/u.  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  Neurological: Positive for sensory change and focal weakness.  All other systems reviewed and are negative.   Objective: Vital Signs: Blood pressure 123/76, pulse 54, temperature 98.1 F (36.7 C), temperature source Oral, resp. rate 18, height 5\' 9"  (1.753 m), weight 72.4 kg (159 lb 9.8 oz), SpO2 97.00%. No results found. No results found for this or any previous visit (from the past 72 hour(s)).   HEENT: normal Cardio: RRR Resp: CTA B/L GI: BS positive Extremity:  No Edema Skin:   Intact Neuro: Confused, Abnormal Sensory absent sensation L side lt touch and proprio, Abnormal Motor 2/5 finger flex and arm ext, 2/5 hip/knee extensory synergy,0/5 L Ankle Abnormal FMC Reflexes 3+, Reflexes: 3+, Tone:  Hypertonia, Inattention and Other L homonymous hemianopsia Musc/Skel:  Normal  Sensation absent L arm and leg Assessment/Plan: 1. Functional deficits secondary to R thalamic bleed which require 3+ hours per day of interdisciplinary therapy in a comprehensive inpatient rehab setting. Physiatrist is providing close team supervision and 24 hour management of active medical problems listed below. Physiatrist and rehab team  continue to assess barriers to discharge/monitor patient progress toward functional and medical goals. FIM: FIM - Bathing Bathing Steps Patient Completed: Chest;Right Arm;Left Arm;Abdomen;Front perineal area;Right upper leg;Left upper leg Bathing: 3: Mod-Patient completes 5-7 46f 10 parts or 50-74%  FIM - Upper Body Dressing/Undressing Upper body dressing/undressing steps patient completed: Thread/unthread right sleeve of pullover shirt/dresss;Thread/unthread left sleeve of pullover shirt/dress;Put head through opening of pull over shirt/dress;Pull shirt over trunk Upper body dressing/undressing: 3: Mod-Patient completed 50-74% of tasks FIM - Lower Body Dressing/Undressing Lower body dressing/undressing steps patient completed: Thread/unthread right underwear leg;Thread/unthread left underwear leg;Pull underwear up/down;Thread/unthread right pants leg;Thread/unthread left pants leg;Pull pants up/down Lower body dressing/undressing: 3: Mod-Patient completed 50-74% of tasks  FIM - Toileting Toileting steps completed by patient: Adjust clothing prior to toileting;Adjust clothing after toileting Toileting Assistive Devices: Grab bar or rail for support Toileting: 3: Mod-Patient completed 2 of 3 steps  FIM - Diplomatic Services operational officer Devices: Grab bars Toilet Transfers: 4-From toilet/BSC: Min A (steadying Pt. > 75%);4-To toilet/BSC: Min A (steadying Pt. > 75%)  FIM - Bed/Chair Transfer Bed/Chair Transfer Assistive Devices: Arm rests Bed/Chair Transfer: 4: Bed > Chair or W/C: Min A (steadying Pt. > 75%);4: Chair or W/C > Bed: Min A (steadying Pt. > 75%)  FIM - Locomotion: Wheelchair Distance: 150 Locomotion: Wheelchair: 5: Travels 150 ft or more: maneuvers on rugs and over door sills with supervision, cueing or coaxing FIM - Locomotion: Ambulation Locomotion: Ambulation Assistive Devices: Designer, industrial/product Ambulation/Gait Assistance: 1: +2 Total assist Locomotion:  Ambulation: 1: Two helpers  Comprehension Comprehension Mode: Auditory Comprehension: 5-Understands complex 90% of  the time/Cues < 10% of the time  Expression Expression Mode: Verbal Expression: 5-Expresses complex 90% of the time/cues < 10% of the time  Social Interaction Social Interaction: 5-Interacts appropriately 90% of the time - Needs monitoring or encouragement for participation or interaction.  Problem Solving Problem Solving: 5-Solves complex 90% of the time/cues < 10% of the time  Memory Memory: 5-Recognizes or recalls 90% of the time/requires cueing < 10% of the time  2. Anticoagulation/DVT prophylaxis with non-Pharmaceutical:  Medical Problem List and Plan:  1. 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 DVT Prophylaxis/Anticoagulation: Subcutaneous Lovenox initiated 10/31/2011. Monitor platelet counts and any signs of bleeding  3. Mood: Continue Ritalin, now is on bid. Sleep-wake cycles have normalized 4. Neuropsych: This patient is not capable of making decisions on his/her own behalf at this time.He has very poor awareness of deficits and grossly overestimates his physical and cognitive abilities  5. Hypertension. Norvasc 10 mg daily, clonidine patch 0.1 mg weekly, hydrochlorothiazide 25 mg daily, lisinopril 20 mg a.m. and 10 mg at bedtime. Monitor with increased mobility  6. Dysphagia. Mechanical soft diet thin liquids. Speech therapy followup. Monitor for any signs of aspiration.   LOS (Days) 17 A FACE TO FACE EVALUATION WAS PERFORMED  Abbeygail Igoe E 12/01/2011, 8:25 AM

## 2011-12-01 NOTE — Progress Notes (Signed)
Physical Therapy Session Note  Patient Details  Name: Anthony Hardin MRN: [id number] Date of Birth: 1951-01-05  Today's Date: 12/01/2011 Time: 1500-1600 Time Calculation (min): 60 min  Short Term Goals: Week 2:  PT Short Term Goal 1 (Week 2): Patient will perform bed mobility on flat bed min A to L and R PT Short Term Goal 1 - Progress (Week 2): Met PT Short Term Goal 2 (Week 2): Patient will perform functional transfers to L and R with min A and mod-max verbal cues for initiation, sequence and to attend to L PT Short Term Goal 2 - Progress (Week 2): Met PT Short Term Goal 3 (Week 2): Patient will perform w/c mobility on unit x 150 with mod A and mod-max verbal and visual cues to attend to L environment PT Short Term Goal 3 - Progress (Week 2): Met PT Short Term Goal 4 (Week 2): Patient will perform gait training on unit x 75' with max A of one person with increased LLE advancement and activation of LLE in stance. PT Short Term Goal 4 - Progress (Week 2): Met  Skilled Therapeutic Interventions/Progress Updates:    Neuromuscular reeducation standing at elevated mat with bil. UEs in weight bearing focusing on modulated control of weight shift Lt. With control of return to midline and self modulation of Lt. Hip position. Cognitive/memory challenges incorporated into session tasks. Car squat pivot transfer performed with moderate assist to Lt., min assist to the Rt. Mod/max verbal cues for sequencing and safety. Wheelchair parts management practiced throughout session. Standing forwards Rt. Foot taps with PT to facilitate Lt. knee control with bil. UE support.   Therapy Documentation Precautions:  Precautions Precautions: Fall Precaution Comments: Lt hemiparesis and LUE sensory deficits, Lt visual neglect Restrictions Weight Bearing Restrictions: No Pain: Pain Assessment Pain Assessment: No/denies pain  See FIM for current functional status  Therapy/Group: Individual  Therapy  Wilhemina Bonito 12/01/2011, 6:02 PM

## 2011-12-01 NOTE — Progress Notes (Signed)
Occupational Therapy Session Note  Patient Details  Name: Anthony Hardin MRN: [id number] Date of Birth: 1951-02-25  Today's Date: 12/01/2011 Time: 0945-1030  (45 min)  1st session Time Calculation (min): 45 min  Time:    1400-1430   (30 min)   2nd session  Short Term Goals: Week 1:  OT Short Term Goal 1 (Week 1): Pt will perform grooming activities seated at sink w/ Mod Assist OT Short Term Goal 1 - Progress (Week 1): Progressing toward goal OT Short Term Goal 2 (Week 1): Pt will perform UB bathing with Mod assist and  VC/TC's while seated at sink OT Short Term Goal 2 - Progress (Week 1): Met OT Short Term Goal 3 (Week 1): Pt will perform UB dressing/Hemi techniques seated with Mod Assist OT Short Term Goal 3 - Progress (Week 1): Met OT Short Term Goal 4 (Week 1): Pt will demonstrate task initiation for ADL/selfcare tasks on 1 out 3 occassions with verbal, visual or tactile cues OT Short Term Goal 4 - Progress (Week 1): Met Week 2:  OT Short Term Goal 1 (Week 2): Pt will perform grooming activities seated at sink w/ Mod Assist OT Short Term Goal 1 - Progress (Week 2): Met OT Short Term Goal 2 (Week 2): Pt will complete UB dressing with min assist with hemi-technique OT Short Term Goal 2 - Progress (Week 2): Progressing toward goal OT Short Term Goal 3 (Week 2): Pt will complete LB dressing with mod assist OT Short Term Goal 3 - Progress (Week 2): Met OT Short Term Goal 4 (Week 2): Pt will complete toilet transfer with mod assist in 2 out of 3 attempts OT Short Term Goal 4 - Progress (Week 2): Met Week 3:  OT Short Term Goal 1 (Week 3): Pt will complete UB dressing with min assist with hemi-technique  OT Short Term Goal 2 (Week 3): Pt will complete grooming with min assist OT Short Term Goal 3 (Week 3): Pt will complete LB dressing with min assist OT Short Term Goal 4 (Week 3): Pt will complete toilet transfer with min assist 2 out of 3 attempts  Skilled Therapeutic  Interventions/Progress Updates:    1st session:  Engaged in bathing and dressing at shower level.  Required mod assist with stand pivot transfers.  Utilized LUE to wash body with gross assist.  Pt. Demonstrated increased sustained attention to task and able to state what he needed to do with 1 questioning cue.  Pt. Did repeat various body parts some during session.  Dressed UB with min assist; lower body with niod assist.   2nd session:  Utilized LUE to make cup of coffee with pt needing assist to push coffee button and guide arm as he cleaned table.  Pt stated he had an awareness of how much work it took to move his LUE in functional tasks.    Therapy Documentation Precautions:  Precautions Precautions: Fall Precaution Comments: Lt hemiparesis and LUE sensory deficits, Lt visual neglect Restrictions Weight Bearing Restrictions: No    Pain: Pain Assessment Pain Assessment: No/denies pain     See FIM for current functional status  Therapy/Group: Individual Therapy  [name] 12/01/2011, 2:31 PM

## 2011-12-01 NOTE — Progress Notes (Signed)
Speech Language Pathology Daily Session Note  Patient Details  Name: Anthony Hardin MRN: [id number] Date of Birth: 1950/03/31  Today's Date: 12/01/2011 Time: 5621-3086 Time Calculation (min): 30 min  Short Term Goals: Week 3: SLP Short Term Goal 1 (Week 3): Patient will sustain attention to a basic familiar task for 5 minutes with min multimodal cues. SLP Short Term Goal 2 (Week 3): Patient will demonstrate emergent awareness by asking for help when needed with mod assist verbal cues. SLP Short Term Goal 3 (Week 3): Patient will solve basic familiar problems with min assist multimodal cues. SLP Short Term Goal 4 (Week 3): Patient will consume regular textures and thin liquids with supervision cues to utilize compensatory strategies with no overt s/s of aspiration.  Skilled Therapeutic Interventions: Treatment session focused on addressing cognitive goals.  SLP facilitated session with functional planning and organizing task: menu generation and grocery list.  SLP facilitated session with mod assist verbal cues to write items after verbally expressing ides.  SLP also provided supervision level semantic cues to attend to the left side of the paper while writing.  Patient required overall mod assist to attend to task due to internal distractions and verbal perseveration on topic of discharge and where he will go after CIR stay.     FIM:  Comprehension Comprehension Mode: Auditory Comprehension: 5-Understands complex 90% of the time/Cues < 10% of the time Expression Expression Mode: Verbal Expression: 5-Expresses complex 90% of the time/cues < 10% of the time Social Interaction Social Interaction: 5-Interacts appropriately 90% of the time - Needs monitoring or encouragement for participation or interaction. Problem Solving Problem Solving: 4-Solves basic 75 - 89% of the time/requires cueing 10 - 24% of the time Memory Memory: 4-Recognizes or recalls 75 - 89% of the time/requires cueing 10  - 24% of the time  Pain Pain Assessment Pain Assessment: No/denies pain  Therapy/Group: Individual Therapy  Charlane Ferretti., CCC-SLP [phone]  Brenlee Koskela 12/01/2011, 11:15 AM

## 2011-12-01 NOTE — Progress Notes (Signed)
Speech Language Pathology Daily Session Note  Patient Details  Name: FALCON MCCASKEY MRN: [id number] Date of Birth: 01/05/1951  Today's Date: 12/01/2011 Time: 1130-1215 Time Calculation (min): 45 min  Short Term Goals: Week 2: SLP Short Term Goal 1 (Week 2): Patient will sustain attention to a basic familiar task for 2 minutes with mod multimodal cues.  SLP Short Term Goal 1 - Progress (Week 2): Met SLP Short Term Goal 2 (Week 2): Patient will demonstrate emergent awareness by asking for help when needed with max assist verbal cues.  SLP Short Term Goal 2 - Progress (Week 2): Met SLP Short Term Goal 3 (Week 2): Patient will solve basic familiar problems with mod assist multimodal cues. SLP Short Term Goal 3 - Progress (Week 2): Met SLP Short Term Goal 4 (Week 2): Patient will consume dys. 3 textures and thin liquids with supervision cues with no overt s/s of aspiration. SLP Short Term Goal 4 - Progress (Week 2): Met SLP Short Term Goal 5 (Week 2): Patient will tolerate trials of regular textures with min assist to utilize compensatory swallowing strategies with no overt s/s of aspiration SLP Short Term Goal 5 - Progress (Week 2): Met  Skilled Therapeutic Interventions: Group treatment focused on dysphagia goals.  Patient consumed regular textures and thin liquids with supervision level verbal cues to compensate for left anterior loss of food. Sister was present and was demonstrating good carryover for cuing patient. Patient demonstrated no overt s/s of aspiration troughout meal.       FIM:  Comprehension Comprehension Mode: Auditory Comprehension: 5-Understands complex 90% of the time/Cues < 10% of the time Expression Expression Mode: Verbal Expression: 5-Expresses complex 90% of the time/cues < 10% of the time Social Interaction Social Interaction: 5-Interacts appropriately 90% of the time - Needs monitoring or encouragement for participation or interaction. Problem  Solving Problem Solving: 4-Solves basic 75 - 89% of the time/requires cueing 10 - 24% of the time Memory Memory: 4-Recognizes or recalls 75 - 89% of the time/requires cueing 10 - 24% of the time  Pain Pain Assessment Pain Assessment: No/denies pain  Therapy/Group: Group Therapy  Charlane Ferretti., CCC-SLP [phone]  Dajon Lazar 12/01/2011, 1:27 PM

## 2011-12-02 ENCOUNTER — Inpatient Hospital Stay (HOSPITAL_COMMUNITY): Payer: BC Managed Care – PPO | Admitting: Speech Pathology

## 2011-12-02 ENCOUNTER — Inpatient Hospital Stay (HOSPITAL_COMMUNITY): Payer: BC Managed Care – PPO | Admitting: *Deleted

## 2011-12-02 NOTE — Progress Notes (Signed)
Occupational Therapy Note  Patient Details  Name: Anthony Hardin MRN: [id number] Date of Birth: 1951/01/23 Today's Date: 12/02/2011  Time: 1130- 1200, 30 min No c/o pain  Pt seen in [employer] to facilitate use of LUE with self feeding and left side awareness. Pt needed several cues to visually track to the left to locate items on the left or to cleanse his face. He did use a fork in his left hand and used right hand to help lift his left hand to his mouth.  SAGUIER,JULIA 12/02/2011, 3:33 PM

## 2011-12-02 NOTE — Progress Notes (Signed)
Occupational Therapy Note  Patient Details  Name: Anthony Hardin MRN: [id number] Date of Birth: 05-25-1950 Today's Date: 12/02/2011  Group Therapy Time:  1400-1500  (60 min) Pain:  None  Engaged in therapeutic neuromuscular re education with focus on LUE in different movement patterns.  Addressed reaching, pulling, lateral grasp, tip to tip pinch.  Pt. Need verbal instructions and manual facilitation to perform.  He required increased time and verbal reinforcement to complete.        [name] 12/02/2011, 6:08 PM

## 2011-12-02 NOTE — Progress Notes (Signed)
Speech Language Pathology Note  Patient Details  Name: [name] MRN: [id number] Date of Birth: January 20, 1951 Today's Date: 12/02/2011  [employer] 12-12:15 SLP service  Group treatment focused on dysphagia goals. Patient consumed Pos with thin liquids at supervision level (verbal cues) to monitor ant loss of food on left; sister again present and  cuing patien appropriately. No clinical evidence of aspiration during course of meal.   Blenda Mounts Laurice 12/02/2011, 3:22 PM

## 2011-12-02 NOTE — Progress Notes (Signed)
Patient ID: Anthony Hardin, male   DOB: 20-Sep-1950, 61 y.o.   MRN: [id number] Admitted 10/31/2011 with diffuse weakness and unable to move and fell out of bed onto the floor. MRI of the brain showed a 4 x 1 x 2.5 cm acute hematoma right thalamus with mass effect and displacement distortion of the third ventricle but no suspicion of obstructive hydrocephalus. MRA of the head with no stenosis or occlusion.   Subjective/Complaints:  Poor sensation L side face, egg and grits not noticed Poor awareness of LUE, hitting hand on tray table without noticing  Review of Systems  Musculoskeletal: Negative for myalgias.  Neurological: Positive for sensory change and focal weakness.  All other systems reviewed and are negative.   Objective: Vital Signs: Blood pressure 115/76, pulse 58, temperature 98.3 F (36.8 C), temperature source Oral, resp. rate 16, height 5\' 9"  (1.753 m), weight 72.4 kg (159 lb 9.8 oz), SpO2 97.00%. No results found. No results found for this or any previous visit (from the past 72 hour(s)).   HEENT: normal Cardio: RRR Resp: CTA B/L GI: BS positive Extremity:  No Edema Skin:   Intact Neuro: Confused, Abnormal Sensory absent sensation L side lt touch and proprio, Abnormal Motor 2/5 finger flex and arm ext, 2/5 hip/knee extensory synergy,0/5 L Ankle Abnormal FMC Reflexes 3+, Reflexes: 3+, Tone:  Hypertonia, Inattention and Other L homonymous hemianopsia Musc/Skel:  Normal  Sensation absent L arm and leg Assessment/Plan: 1. Functional deficits secondary to R thalamic bleed which require 3+ hours per day of interdisciplinary therapy in a comprehensive inpatient rehab setting. Physiatrist is providing close team supervision and 24 hour management of active medical problems listed below. Physiatrist and rehab team continue to assess barriers to discharge/monitor patient progress toward functional and medical goals. FIM: FIM - Bathing Bathing Steps Patient Completed:  Chest;Right Arm;Left Arm;Abdomen;Front perineal area;Right upper leg;Left upper leg Bathing: 3: Mod-Patient completes 5-7 45f 10 parts or 50-74%  FIM - Upper Body Dressing/Undressing Upper body dressing/undressing steps patient completed: Thread/unthread right sleeve of pullover shirt/dresss;Thread/unthread left sleeve of pullover shirt/dress;Put head through opening of pull over shirt/dress;Pull shirt over trunk Upper body dressing/undressing: 3: Mod-Patient completed 50-74% of tasks FIM - Lower Body Dressing/Undressing Lower body dressing/undressing steps patient completed: Thread/unthread right underwear leg;Thread/unthread left underwear leg;Pull underwear up/down;Thread/unthread right pants leg;Thread/unthread left pants leg;Pull pants up/down Lower body dressing/undressing: 3: Mod-Patient completed 50-74% of tasks  FIM - Toileting Toileting steps completed by patient: Adjust clothing prior to toileting;Adjust clothing after toileting Toileting Assistive Devices: Grab bar or rail for support Toileting: 3: Mod-Patient completed 2 of 3 steps  FIM - Diplomatic Services operational officer Devices: Grab bars Toilet Transfers: 4-From toilet/BSC: Min A (steadying Pt. > 75%);4-To toilet/BSC: Min A (steadying Pt. > 75%)  FIM - Bed/Chair Transfer Bed/Chair Transfer Assistive Devices: Arm rests Bed/Chair Transfer: 4: Bed > Chair or W/C: Min A (steadying Pt. > 75%);4: Chair or W/C > Bed: Min A (steadying Pt. > 75%)  FIM - Locomotion: Wheelchair Distance: 150 Locomotion: Wheelchair: 5: Travels 150 ft or more: maneuvers on rugs and over door sills with supervision, cueing or coaxing FIM - Locomotion: Ambulation Locomotion: Ambulation Assistive Devices: Designer, industrial/product Ambulation/Gait Assistance: 1: +2 Total assist Locomotion: Ambulation: 1: Two helpers  Comprehension Comprehension Mode: Auditory Comprehension: 5-Understands complex 90% of the time/Cues < 10% of the  time  Expression Expression Mode: Verbal Expression: 5-Expresses complex 90% of the time/cues < 10% of the time  Social Interaction Social Interaction: 5-Interacts  appropriately 90% of the time - Needs monitoring or encouragement for participation or interaction.  Problem Solving Problem Solving: 4-Solves basic 75 - 89% of the time/requires cueing 10 - 24% of the time  Memory Memory: 4-Recognizes or recalls 75 - 89% of the time/requires cueing 10 - 24% of the time  2. Anticoagulation/DVT prophylaxis with non-Pharmaceutical:  Medical Problem List and Plan:  1. 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 DVT Prophylaxis/Anticoagulation: Subcutaneous Lovenox initiated 10/31/2011. Monitor platelet counts and any signs of bleeding  3. Mood: Continue Ritalin, now is on bid. Sleep-wake cycles have normalized 4. Neuropsych: This patient is not capable of making decisions on his/her own behalf at this time.He has very poor awareness of deficits and grossly overestimates his physical and cognitive abilities  5. Hypertension. Norvasc 10 mg daily, clonidine patch 0.1 mg weekly, hydrochlorothiazide 25 mg daily, lisinopril 20 mg a.m. and 10 mg at bedtime. Monitor with increased mobility  6. Dysphagia. Mechanical soft diet thin liquids. Speech therapy followup. Monitor for any signs of aspiration.   LOS (Days) 18 A FACE TO FACE EVALUATION WAS PERFORMED  Eldine Rencher E 12/02/2011, 8:14 AM

## 2011-12-03 ENCOUNTER — Inpatient Hospital Stay (HOSPITAL_COMMUNITY): Payer: BC Managed Care – PPO | Admitting: *Deleted

## 2011-12-03 NOTE — Plan of Care (Signed)
Problem: RH BOWEL ELIMINATION Goal: RH STG MANAGE BOWEL WITH ASSISTANCE STG Manage Bowel with Min Assistance.  Outcome: Not Progressing Patient had no bowel movement in two days.

## 2011-12-03 NOTE — Progress Notes (Signed)
Patient ID: Anthony Hardin, male   DOB: 11-23-1950, 61 y.o.   MRN: [id number] Admitted 10/31/2011 with diffuse weakness and unable to move and fell out of bed onto the floor. MRI of the brain showed a 4 x 1 x 2.5 cm acute hematoma right thalamus with mass effect and displacement distortion of the third ventricle but no suspicion of obstructive hydrocephalus. MRA of the head with no stenosis or occlusion.   Subjective/Complaints:  Controlled fall yesterday.  Starting to get tingling Answered questions from pt and sister  Review of Systems  Musculoskeletal: Negative for myalgias.  Neurological: Positive for sensory change and focal weakness.  All other systems reviewed and are negative.   Objective: Vital Signs: Blood pressure 125/83, pulse 72, temperature 97.6 F (36.4 C), temperature source Oral, resp. rate 18, height 5\' 9"  (1.753 m), weight 72.4 kg (159 lb 9.8 oz), SpO2 98.00%. No results found. No results found for this or any previous visit (from the past 72 hour(s)).   HEENT: normal Cardio: RRR Resp: CTA B/L GI: BS positive Extremity:  No Edema Skin:   Intact Neuro: Confused, Abnormal Sensory absent sensation L side lt touch and proprio, Abnormal Motor 2/5 finger flex and arm ext, 2/5 hip/knee extensory synergy,0/5 L Ankle Abnormal FMC Reflexes 3+, Reflexes: 3+, Tone:  Hypertonia, Inattention and Other L homonymous hemianopsia Musc/Skel:  Normal  Sensation absent L arm and leg Assessment/Plan: 1. Functional deficits secondary to R thalamic bleed which require 3+ hours per day of interdisciplinary therapy in a comprehensive inpatient rehab setting. Physiatrist is providing close team supervision and 24 hour management of active medical problems listed below. Physiatrist and rehab team continue to assess barriers to discharge/monitor patient progress toward functional and medical goals. FIM: FIM - Bathing Bathing Steps Patient Completed: Chest;Right Arm;Left Arm;Abdomen;Front  perineal area;Right upper leg;Left upper leg Bathing: 3: Mod-Patient completes 5-7 65f 10 parts or 50-74%  FIM - Upper Body Dressing/Undressing Upper body dressing/undressing steps patient completed: Thread/unthread right sleeve of pullover shirt/dresss;Thread/unthread left sleeve of pullover shirt/dress;Put head through opening of pull over shirt/dress;Pull shirt over trunk Upper body dressing/undressing: 3: Mod-Patient completed 50-74% of tasks FIM - Lower Body Dressing/Undressing Lower body dressing/undressing steps patient completed: Thread/unthread right underwear leg;Thread/unthread left underwear leg;Pull underwear up/down;Thread/unthread right pants leg;Thread/unthread left pants leg;Pull pants up/down Lower body dressing/undressing: 3: Mod-Patient completed 50-74% of tasks  FIM - Toileting Toileting steps completed by patient: Adjust clothing prior to toileting;Adjust clothing after toileting Toileting Assistive Devices: Grab bar or rail for support Toileting: 3: Mod-Patient completed 2 of 3 steps  FIM - Diplomatic Services operational officer Devices: Grab bars Toilet Transfers: 4-From toilet/BSC: Min A (steadying Pt. > 75%);4-To toilet/BSC: Min A (steadying Pt. > 75%)  FIM - Bed/Chair Transfer Bed/Chair Transfer Assistive Devices: Arm rests Bed/Chair Transfer: 4: Bed > Chair or W/C: Min A (steadying Pt. > 75%);4: Chair or W/C > Bed: Min A (steadying Pt. > 75%)  FIM - Locomotion: Wheelchair Distance: 150 Locomotion: Wheelchair: 5: Travels 150 ft or more: maneuvers on rugs and over door sills with supervision, cueing or coaxing FIM - Locomotion: Ambulation Locomotion: Ambulation Assistive Devices: Designer, industrial/product Ambulation/Gait Assistance: 1: +2 Total assist Locomotion: Ambulation: 1: Two helpers  Comprehension Comprehension Mode: Auditory Comprehension: 5-Understands complex 90% of the time/Cues < 10% of the time  Expression Expression Mode: Verbal Expression:  5-Expresses complex 90% of the time/cues < 10% of the time  Social Interaction Social Interaction: 5-Interacts appropriately 90% of the time - Needs  monitoring or encouragement for participation or interaction.  Problem Solving Problem Solving: 4-Solves basic 75 - 89% of the time/requires cueing 10 - 24% of the time  Memory Memory: 4-Recognizes or recalls 75 - 89% of the time/requires cueing 10 - 24% of the time  2. Anticoagulation/DVT prophylaxis with non-Pharmaceutical:  Medical Problem List and Plan:  1. 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 DVT Prophylaxis/Anticoagulation: Subcutaneous Lovenox initiated 10/31/2011. Monitor platelet counts and any signs of bleeding  3. Mood: Continue Ritalin, now is on bid. Sleep-wake cycles have normalized 4. Neuropsych: This patient is not capable of making decisions on his/her own behalf at this time.He has very poor awareness of deficits and grossly overestimates his physical and cognitive abilities  5. Hypertension. Norvasc 10 mg daily, clonidine patch 0.1 mg weekly, hydrochlorothiazide 25 mg daily, lisinopril 20 mg a.m. and 10 mg at bedtime. Monitor with increased mobility  6. Dysphagia. Mechanical soft diet thin liquids. Speech therapy followup. Monitor for any signs of aspiration.   LOS (Days) 19 A FACE TO FACE EVALUATION WAS PERFORMED  KIRSTEINS,ANDREW E 12/03/2011, 8:34 AM

## 2011-12-03 NOTE — Progress Notes (Signed)
Occupational Therapy Note  Patient Details  Name: Anthony Hardin MRN: [id number] Date of Birth: 08/22/1950 Today's Date: 12/03/2011  Group therapy Time:  1300-1400  (60 min) Pain:  None  Engaged in neuromuscular UE group with treatment focused on LUE in various movement patterns.  Provided verbal cues for motor movements.  Pt. Showed better manipulation with cards using lateral pinch, and whole hand grasp.  Pt. Maintained selective attention during activity.  He needed about 5 minutes to lift the card and repeated cues on best way to manipulate.  Demonstrated mild inattention to the right side.     [name] 12/03/2011, 6:23 PM

## 2011-12-03 NOTE — Plan of Care (Signed)
Problem: RH SAFETY Goal: RH STG ADHERE TO SAFETY PRECAUTIONS W/ASSISTANCE/DEVICE STG Adhere to Safety Precautions With min Assistance/Device.  Outcome: Not Progressing Patient with poor safety awareness and poor judgement.

## 2011-12-04 ENCOUNTER — Inpatient Hospital Stay (HOSPITAL_COMMUNITY): Payer: BC Managed Care – PPO | Admitting: Occupational Therapy

## 2011-12-04 ENCOUNTER — Inpatient Hospital Stay (HOSPITAL_COMMUNITY): Payer: BC Managed Care – PPO | Admitting: Speech Pathology

## 2011-12-04 ENCOUNTER — Encounter (HOSPITAL_COMMUNITY): Payer: BC Managed Care – PPO | Admitting: Occupational Therapy

## 2011-12-04 ENCOUNTER — Inpatient Hospital Stay (HOSPITAL_COMMUNITY): Payer: BC Managed Care – PPO | Admitting: Physical Therapy

## 2011-12-04 NOTE — Progress Notes (Signed)
Physical Therapy Session Note  Patient Details  Name: Anthony Hardin MRN: [id number] Date of Birth: 18-Sep-1950  Today's Date: 12/04/2011 Time: 1017-1120 Time Calculation (min): 63 min  Short Term Goals: Week 3:  PT Short Term Goal 1 (Week 3): = LTG  Skilled Therapeutic Interventions/Progress Updates:   Patient reporting controlled fall over weekend with assistance needed to return to bed from floor.  W/c on unit with supervision and verbal cues for sequence and safety.  Transfers mat <> w/c with squat scooting to L and R with min-mod A for safety, sequence and initiation.  Transitioned to floor with +2 A; discussed safety concerns and indications for calling EMS when a fall has occurred.  Performed floor > furniture transfer with mod-max A and total verbal, visual and questioning cues for safety, sequence from sidelying to quadruped > tall kneeling > half kneeling to stand and turn to sit on mat.    Gait training x 40' with RW and hand orthosis with +2 A for safety and balance with verbal, tactile and visual cues to minimize pushing through RUE to shift weight to L, upright trunk and gaze and anterior weight shifting and for attention to LLE for full advancement and safe placement and to widen BOS.  Performed L side stepping with UE support on wall rail with mod-max A for LLE advancement, upright trunk and full hip and knee extension and full L lateral weight shift during R advancement; improved weight shifting and activation of LLE during side stepping.  Therapy Documentation Precautions:  Precautions Precautions: Fall Precaution Comments: Lt hemiparesis and LUE sensory deficits, Lt visual neglect Restrictions Weight Bearing Restrictions: No Pain:  No c/o pain Locomotion : Wheelchair Mobility Distance: 150 supervision still with verbal cues to initiate and sequence w/c parts management and attention to L environment and obstacle negotiation See FIM for current functional  status  Therapy/Group: Individual Therapy  [name] [facility] 12/04/2011, 12:21 PM

## 2011-12-04 NOTE — Progress Notes (Signed)
Social Work Patient ID: Anthony Hardin, male   DOB: [birth year], 61 y.o.   MRN: [id number] Met with pt and sister to inform insurance-[employer] want pt to be moved to [facility] as soon as possible, if this is the plan. Sister and pt want him to stay here until 10/1 pt's stated discharge date.  Sister to contact [employer]-casemanager to Discuss case.  Completed FL2 and will submit to [facility] for possible placement after rehab.  Await bed availability.

## 2011-12-04 NOTE — Progress Notes (Signed)
Occupational Therapy Session Note  Patient Details  Name: Anthony Hardin MRN: [id number] Date of Birth: 04-13-1950  Today's Date: 12/04/2011 Time: 4098-1191 Time Calculation (min): 30 min  Skilled Therapeutic Interventions/Progress Updates:    Pt performed shaving using electric razor in standing.  Able to maintain standing with only min facilitation.  Used the LUE to perform 75% of shaving with mod facilitation.  Pt dropped the razor one time but was able to state awareness that he dropped it because he could not feel it in his hand, without looking at it.  Finished shaving with the right hand for thoroughness.  Also had him work on washing his face with the left hand and washcloth as well.  Finished session having pt focus on washing his bedside table with the left hand to promote increased sensation and active movement.    Therapy Documentation Precautions:  Precautions Precautions: Fall Precaution Comments: Lt hemiparesis and LUE sensory deficits, Lt visual neglect Restrictions Weight Bearing Restrictions: No  Pain: Pain Assessment Pain Assessment: No/denies pain  ADL: See FIM for current functional status  Therapy/Group: Individual Therapy  Kenya Kook OTR/L 12/04/2011, 4:25 PM

## 2011-12-04 NOTE — Progress Notes (Signed)
Occupational Therapy Session Note  Patient Details  Name: Anthony Hardin MRN: [id number] Date of Birth: 09-23-1950  Today's Date: 12/04/2011 Time: 0815-0905 Time Calculation (min): 50 min  Short Term Goals: Week 3:  OT Short Term Goal 1 (Week 3): Pt will complete UB dressing with min assist with hemi-technique  OT Short Term Goal 2 (Week 3): Pt will complete grooming with min assist OT Short Term Goal 3 (Week 3): Pt will complete LB dressing with min assist OT Short Term Goal 4 (Week 3): Pt will complete toilet transfer with min assist 2 out of 3 attempts  Skilled Therapeutic Interventions/Progress Updates:    Pt seen for individual OT treatment session with focus on ADL retraining for self feeding & at shower level.  Pt was just getting his breakfast upon arrival in room. He requires occasional assist to open packages, containers. He uses LUE as a gross assist overall. Toilet and shower transfers were Min-mod A level w/ occasional VC's for safety and placement of LLE. Pt has demonstrated increased awareness of deficits and is attempting to use LUE as gross A throughout session noted. He benefits from facilitation LUE at elbow level. Therapy Documentation Precautions:  Precautions Precautions: Fall Precaution Comments: Lt hemiparesis and LUE sensory deficits, Lt visual neglect Restrictions Weight Bearing Restrictions: No   Vital Signs: Therapy Vitals BP: 128/78 mmHg Pain: Pain Assessment Pain Assessment: No/denies pain Pain Score: 0-No pain     See FIM for current functional status  Therapy/Group: Individual Therapy  [name] 12/04/2011, 10:23 AM

## 2011-12-04 NOTE — Progress Notes (Signed)
Speech Language Pathology Daily Session Note  Patient Details  Name: Anthony Hardin MRN: [id number] Date of Birth: 03/15/1950  Today's Date: 12/04/2011 Time: 4098-1191 Time Calculation (min): 40 min  Short Term Goals: Week 3: SLP Short Term Goal 1 (Week 3): Patient will sustain attention to a basic familiar task for 5 minutes with min multimodal cues. SLP Short Term Goal 2 (Week 3): Patient will demonstrate emergent awareness by asking for help when needed with mod assist verbal cues. SLP Short Term Goal 3 (Week 3): Patient will solve basic familiar problems with min assist multimodal cues. SLP Short Term Goal 4 (Week 3): Patient will consume regular textures and thin liquids with supervision cues to utilize compensatory strategies with no overt s/s of aspiration.  Skilled Therapeutic Interventions: Treatment session focused on addressing cognitive goals with focus on completing moderately complex self care tasks.  SLP facilitated session with supervision level verbal cues to recall/problem solve how to propel wheelchair and fix coffee.  SLP also facilitated session with increased wait time to complete moderately complex daily functional math problems with 100% accuracy.  Patient demonstrated more appropriate topic maintenance with decreased verbal perseveration as compared to last week.       FIM:  Comprehension Comprehension Mode: Auditory Comprehension: 5-Understands complex 90% of the time/Cues < 10% of the time Expression Expression Mode: Verbal Expression: 5-Expresses complex 90% of the time/cues < 10% of the time Social Interaction Social Interaction: 5-Interacts appropriately 90% of the time - Needs monitoring or encouragement for participation or interaction. Problem Solving Problem Solving: 5-Solves basic 90% of the time/requires cueing < 10% of the time Memory Memory: 4-Recognizes or recalls 75 - 89% of the time/requires cueing 10 - 24% of the time  Pain Pain  Assessment Pain Assessment: No/denies pain  Therapy/Group: Individual Therapy  Charlane Ferretti., CCC-SLP [phone]  Anthony Hardin 12/04/2011, 4:43 PM

## 2011-12-04 NOTE — Progress Notes (Signed)
Patient ID: Anthony Hardin, male   DOB: [birth year], 61 y.o.   MRN: [id number] Admitted 10/31/2011 with diffuse weakness and unable to move and fell out of bed onto the floor. MRI of the brain showed a 4 x 1 x 2.5 cm acute hematoma right thalamus with mass effect and displacement distortion of the third ventricle but no suspicion of obstructive hydrocephalus. MRA of the head with no stenosis or occlusion.   Subjective/Complaints: I am more aware of left  Review of Systems  Musculoskeletal: Negative for myalgias.  Neurological: Positive for sensory change and focal weakness.  All other systems reviewed and are negative.   Objective: Vital Signs: Blood pressure 128/78, pulse 62, temperature 97.8 F (36.6 C), temperature source Oral, resp. rate 18, height 5\' 9"  (1.753 m), weight 72.4 kg (159 lb 9.8 oz), SpO2 97.00%. No results found. No results found for this or any previous visit (from the past 72 hour(s)).   HEENT: normal Cardio: RRR Resp: CTA B/L GI: BS positive Extremity:  No Edema Skin:   Intact Neuro: Confused, Abnormal Sensory absent sensation L side lt touch and proprio, Abnormal Motor 2/5 finger flex and arm ext, 2/5 hip/knee extensory synergy,0/5 L Ankle Abnormal FMC Reflexes 3+, Reflexes: 3+, Tone:  Hypertonia, Inattention and Other L homonymous hemianopsia Musc/Skel:  Normal  Sensation absent L arm and leg Assessment/Plan: 1. Functional deficits secondary to R thalamic bleed which require 3+ hours per day of interdisciplinary therapy in a comprehensive inpatient rehab setting. Physiatrist is providing close team supervision and 24 hour management of active medical problems listed below. Physiatrist and rehab team continue to assess barriers to discharge/monitor patient progress toward functional and medical goals. FIM: FIM - Bathing Bathing Steps Patient Completed: Chest;Right Arm;Left Arm;Abdomen;Front perineal area;Right upper leg;Left upper leg Bathing: 3: Mod-Patient  completes 5-7 75f 10 parts or 50-74%  FIM - Upper Body Dressing/Undressing Upper body dressing/undressing steps patient completed: Thread/unthread right sleeve of pullover shirt/dresss;Thread/unthread left sleeve of pullover shirt/dress;Put head through opening of pull over shirt/dress;Pull shirt over trunk Upper body dressing/undressing: 3: Mod-Patient completed 50-74% of tasks FIM - Lower Body Dressing/Undressing Lower body dressing/undressing steps patient completed: Thread/unthread right underwear leg;Thread/unthread left underwear leg;Pull underwear up/down;Thread/unthread right pants leg;Thread/unthread left pants leg;Pull pants up/down Lower body dressing/undressing: 3: Mod-Patient completed 50-74% of tasks  FIM - Toileting Toileting steps completed by patient: Adjust clothing prior to toileting;Adjust clothing after toileting Toileting Assistive Devices: Grab bar or rail for support Toileting: 3: Mod-Patient completed 2 of 3 steps  FIM - Diplomatic Services operational officer Devices: Grab bars Toilet Transfers: 4-From toilet/BSC: Min A (steadying Pt. > 75%);4-To toilet/BSC: Min A (steadying Pt. > 75%)  FIM - Bed/Chair Transfer Bed/Chair Transfer Assistive Devices: Arm rests Bed/Chair Transfer: 4: Bed > Chair or W/C: Min A (steadying Pt. > 75%);4: Chair or W/C > Bed: Min A (steadying Pt. > 75%)  FIM - Locomotion: Wheelchair Distance: 150 Locomotion: Wheelchair: 5: Travels 150 ft or more: maneuvers on rugs and over door sills with supervision, cueing or coaxing FIM - Locomotion: Ambulation Locomotion: Ambulation Assistive Devices: Designer, industrial/product Ambulation/Gait Assistance: 1: +2 Total assist Locomotion: Ambulation: 1: Two helpers  Comprehension Comprehension Mode: Auditory Comprehension: 5-Understands complex 90% of the time/Cues < 10% of the time  Expression Expression Mode: Verbal Expression: 5-Expresses complex 90% of the time/cues < 10% of the time  Social  Interaction Social Interaction: 5-Interacts appropriately 90% of the time - Needs monitoring or encouragement for participation or interaction.  Problem  Solving Problem Solving: 4-Solves basic 75 - 89% of the time/requires cueing 10 - 24% of the time  Memory Memory: 4-Recognizes or recalls 75 - 89% of the time/requires cueing 10 - 24% of the time  2. Anticoagulation/DVT prophylaxis with non-Pharmaceutical:  Medical Problem List and Plan:  1. 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 DVT Prophylaxis/Anticoagulation: Subcutaneous Lovenox initiated 10/31/2011. Monitor platelet counts and any signs of bleeding  3. Mood: Continue Ritalin, now is on bid. Sleep-wake cycles have normalized 4. Neuropsych: This patient is  capable of making decisions on his/her own behalf at this time.He has  poor awareness of deficits and grossly overestimates his physical and cognitive abilities  5. Hypertension. Norvasc 10 mg daily, clonidine patch 0.1 mg weekly, hydrochlorothiazide 25 mg daily, lisinopril 20 mg a.m. and 10 mg at bedtime. Monitor with increased mobility  6. Dysphagia. Mechanical soft diet thin liquids. Speech therapy followup. Monitor for any signs of aspiration.   LOS (Days) 20 A FACE TO FACE EVALUATION WAS PERFORMED  Yarlin Breisch E 12/04/2011, 9:32 AM

## 2011-12-05 ENCOUNTER — Inpatient Hospital Stay (HOSPITAL_COMMUNITY): Payer: BC Managed Care – PPO | Admitting: Physical Therapy

## 2011-12-05 ENCOUNTER — Inpatient Hospital Stay (HOSPITAL_COMMUNITY): Payer: BC Managed Care – PPO | Admitting: Speech Pathology

## 2011-12-05 ENCOUNTER — Inpatient Hospital Stay (HOSPITAL_COMMUNITY): Payer: BC Managed Care – PPO | Admitting: Occupational Therapy

## 2011-12-05 NOTE — Progress Notes (Signed)
I have read and agree with the following treatment session.  Diala Waxman Hall, PT, DPT 

## 2011-12-05 NOTE — Progress Notes (Signed)
Physical Therapy Session Note  Patient Details  Name: Anthony Hardin MRN: [id number] Date of Birth: 1951-01-05  Today's Date: 12/05/2011 Time: 1530-1630 Time Calculation (min): 60 min  Short Term Goals: Week 2:  PT Short Term Goal 1 (Week 2): Patient will perform bed mobility on flat bed min A to L and R PT Short Term Goal 1 - Progress (Week 2): Met PT Short Term Goal 2 (Week 2): Patient will perform functional transfers to L and R with min A and mod-max verbal cues for initiation, sequence and to attend to L PT Short Term Goal 2 - Progress (Week 2): Met PT Short Term Goal 3 (Week 2): Patient will perform w/c mobility on unit x 150 with mod A and mod-max verbal and visual cues to attend to L environment PT Short Term Goal 3 - Progress (Week 2): Met PT Short Term Goal 4 (Week 2): Patient will perform gait training on unit x 75' with max A of one person with increased LLE advancement and activation of LLE in stance. PT Short Term Goal 4 - Progress (Week 2): Met      Therapy Documentation Precautions:  Precautions Precautions: Fall Precaution Comments: Lt hemiparesis and LUE sensory deficits, Lt visual neglect Restrictions Weight Bearing Restrictions: No Pain: Pain Assessment Pain Assessment: No/denies pain Mobility: Patient performed supine > sit at EOB with supervision and use of 1 bed rail. Patient performed bed > w/c transfer with UE support and min guard. Patient required min assist for set up of w/c prior to leaving room. Patient propelled from room > PT gym (~150 feet) with supervision and demonstrated ability to carry a conversation while completing task.       Balance: Patient performed lateral and anterior/posterior weight shifting using Biodex for visual feedback, postural control, and to encourage appropriate weight shift for gait. Patient initially presented with increased difficulty weight shifting to the L, however was able to perform consistently toward end of  session. Patient required vcs for initiation and form, and tactile cues to prevent pushing with RUE and to shift weight through hips.  Patient also performed limits of stability task on Biodex, again requiring vcs and tactile cues for the listed impairments above. Patient presented with increased difficulty with anterior weight shifting and weight shifting to the left, left/anterior, and left/posterior secondary to impaired activation of LLE. With vcs patient was able to correct and improve weight shift.      Other Treatments:   Patient ascended/descended 5 steps reciprocally with BUE support and total assist secondary to two helpers. Exercise chosen to assist patient with postural control and forward progression during gait. Patient required assistance with LLE progression and foot clearance, and vcs for initiation, sequencing, and form. Patient presents with improved anterior tibial translation, however continues to demonstrated poor eccentric control on LLE.   See FIM for current functional status  Therapy/Group: Individual Therapy  Randa Riss Hamilton DPT Student 12/05/2011, 5:12 PM

## 2011-12-05 NOTE — Progress Notes (Signed)
Patient ID: Anthony Hardin, male   DOB: 01-25-1951, 61 y.o.   MRN: [id number] Admitted 10/31/2011 with diffuse weakness and unable to move and fell out of bed onto the floor. MRI of the brain showed a 4 x 1 x 2.5 cm acute hematoma right thalamus with mass effect and displacement distortion of the third ventricle but no suspicion of obstructive hydrocephalus. MRA of the head with no stenosis or occlusion.   Subjective/Complaints: No pain, slept well.  Review of Systems  Musculoskeletal: Negative for myalgias.  Neurological: Positive for sensory change and focal weakness.  All other systems reviewed and are negative.   Objective: Vital Signs: Blood pressure 118/78, pulse 53, temperature 97.6 F (36.4 C), temperature source Oral, resp. rate 20, height 5\' 9"  (1.753 m), weight 72.4 kg (159 lb 9.8 oz), SpO2 97.00%. No results found. No results found for this or any previous visit (from the past 72 hour(s)).   HEENT: normal Cardio: RRR Resp: CTA B/L GI: BS positive Extremity:  No Edema Skin:   Intact Neuro: Confused, Abnormal Sensory absent sensation L side lt touch and proprio, Abnormal Motor 2/5 finger flex and arm ext, 2/5 hip/knee extensory synergy,0/5 L Ankle Abnormal FMC Reflexes 3+, Reflexes: 3+, Tone:  Hypertonia, Inattention and Other L homonymous hemianopsia Musc/Skel:  Normal  Sensation absent L arm and leg Assessment/Plan: 1. Functional deficits secondary to R thalamic bleed which require 3+ hours per day of interdisciplinary therapy in a comprehensive inpatient rehab setting. Physiatrist is providing close team supervision and 24 hour management of active medical problems listed below. Physiatrist and rehab team continue to assess barriers to discharge/monitor patient progress toward functional and medical goals. FIM: FIM - Bathing Bathing Steps Patient Completed: Chest;Right Arm;Left Arm;Abdomen;Front perineal area;Right upper leg;Left upper leg Bathing: 3: Mod-Patient  completes 5-7 22f 10 parts or 50-74%  FIM - Upper Body Dressing/Undressing Upper body dressing/undressing steps patient completed: Thread/unthread right sleeve of pullover shirt/dresss;Thread/unthread left sleeve of pullover shirt/dress;Put head through opening of pull over shirt/dress;Pull shirt over trunk Upper body dressing/undressing: 3: Mod-Patient completed 50-74% of tasks FIM - Lower Body Dressing/Undressing Lower body dressing/undressing steps patient completed: Thread/unthread right underwear leg;Thread/unthread left underwear leg;Pull underwear up/down;Thread/unthread right pants leg;Thread/unthread left pants leg;Pull pants up/down;Don/Doff right shoe;Don/Doff left shoe Lower body dressing/undressing: 3: Mod-Patient completed 50-74% of tasks  FIM - Toileting Toileting steps completed by patient: Adjust clothing prior to toileting;Performs perineal hygiene Toileting Assistive Devices: Grab bar or rail for support Toileting: 3: Mod-Patient completed 2 of 3 steps  FIM - Diplomatic Services operational officer Devices: Grab bars Toilet Transfers: 4-To toilet/BSC: Min A (steadying Pt. > 75%);4-From toilet/BSC: Min A (steadying Pt. > 75%)  FIM - Bed/Chair Transfer Bed/Chair Transfer Assistive Devices: Arm rests Bed/Chair Transfer: 3: Bed > Chair or W/C: Mod A (lift or lower assist);3: Chair or W/C > Bed: Mod A (lift or lower assist)  FIM - Locomotion: Wheelchair Distance: 150 Locomotion: Wheelchair: 5: Travels 150 ft or more: maneuvers on rugs and over door sills with supervision, cueing or coaxing FIM - Locomotion: Ambulation Locomotion: Ambulation Assistive Devices: Designer, industrial/product Ambulation/Gait Assistance: 1: +2 Total assist Locomotion: Ambulation: 1: Two helpers  Comprehension Comprehension Mode: Auditory Comprehension: 5-Understands complex 90% of the time/Cues < 10% of the time  Expression Expression Mode: Verbal Expression: 5-Expresses complex 90% of the  time/cues < 10% of the time  Social Interaction Social Interaction: 5-Interacts appropriately 90% of the time - Needs monitoring or encouragement for participation or interaction.  Problem Solving Problem Solving: 5-Solves complex 90% of the time/cues < 10% of the time  Memory Memory: 4-Recognizes or recalls 75 - 89% of the time/requires cueing 10 - 24% of the time  2. Anticoagulation/DVT prophylaxis with non-Pharmaceutical:  Medical Problem List and Plan:  1. 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 DVT Prophylaxis/Anticoagulation: Subcutaneous Lovenox initiated 10/31/2011. Monitor platelet counts and any signs of bleeding  3. Mood: Continue Ritalin, now is on bid. Sleep-wake cycles have normalized 4. Neuropsych: This patient is  capable of making decisions on his/her own behalf at this time.He has  poor awareness of deficits and grossly overestimates his physical and cognitive abilities  5. Hypertension. Norvasc 10 mg daily, clonidine patch 0.1 mg weekly, hydrochlorothiazide 25 mg daily, lisinopril 20 mg a.m. and 10 mg at bedtime. Monitor with increased mobility  6. Dysphagia. Mechanical soft diet thin liquids. Speech therapy followup. Monitor for any signs of aspiration.   LOS (Days) 21 A FACE TO FACE EVALUATION WAS PERFORMED  [name] 12/05/2011, 9:05 AM

## 2011-12-05 NOTE — Progress Notes (Signed)
Nutrition Follow-up  Intervention:   1. Offer appropriate alternatives it pt refuses meals 2. RD to continue to follow nutrition care plan  Assessment:   Discussed intake of meals and food choices. Pt reports that he is pleased with his meal selection and is able to tell me what he's been ordering for meals and what he prefers. Pt states that he likes to eat lean proteins and balanced meals. Says his friends/family bring him sweets, but he tries not to eat much of them because he is fairly health conscious.  Diet Order:  Regular Supplement: Resource Breeze PO daily  Meds: Scheduled Meds:    . amLODipine  10 mg Oral Daily  . antiseptic oral rinse  15 mL Mouth Rinse BID  . cloNIDine  0.1 mg Transdermal Weekly  . enoxaparin (LOVENOX) injection  40 mg Subcutaneous Q24H  . feeding supplement  1 Container Oral Q24H  . hydrochlorothiazide  25 mg Oral Daily  . lisinopril  10 mg Oral QHS  . lisinopril  20 mg Oral Daily  . methylphenidate  5 mg Oral BID WC  . multivitamin with minerals  1 tablet Oral Daily  . potassium chloride  10 mEq Oral Daily  . senna-docusate  1 tablet Oral BID   Continuous Infusions:  PRN Meds:.acetaminophen, bisacodyl, ondansetron (ZOFRAN) IV, ondansetron, polyethylene glycol, sorbitol  Labs:  CMP     Component Value Date/Time   NA 135 11/15/2011 0625   K 3.9 11/15/2011 0625   CL 96 11/15/2011 0625   CO2 28 11/15/2011 0625   GLUCOSE 103* 11/15/2011 0625   BUN 30* 11/15/2011 0625   CREATININE 1.19 11/15/2011 0625   CALCIUM 10.4 11/15/2011 0625   PROT 7.0 11/15/2011 0625   ALBUMIN 3.5 11/15/2011 0625   AST 18 11/15/2011 0625   ALT 38 11/15/2011 0625   ALKPHOS 58 11/15/2011 0625   BILITOT 0.5 11/15/2011 0625   GFRNONAA 65* 11/15/2011 0625   GFRAA 76* 11/15/2011 0625     Intake/Output Summary (Last 24 hours) at 12/05/11 1147 Last data filed at 12/04/11 2300  Gross per 24 hour  Intake    480 ml  Output      0 ml  Net    480 ml  BM 9/22  Weight Status:  159 lb -  stable  Estimated needs:  1900 - 2000 kcal, 90 - 105 grams protein  Nutrition Dx:  Inadequate oral intake r/t decreased alertness AEB poor meal completion (<25%.) Resolved.  Goal: Pt to meet >/= 90% of their estimated nutrition needs;  met  Monitor: weight trends, lab trends, I/O's, PO intake, supplement tolerance  Jarold Motto MS, RD, LDN Pager: [phone] After-hours pager: [phone]

## 2011-12-05 NOTE — Progress Notes (Signed)
Physical Therapy Weekly Progress Note  Patient Details  Name: Anthony Hardin MRN: [id number] Date of Birth: 11-28-1950  Today's Date: 12/05/2011 Time: 0900-0950 Time Calculation (min): 50 min  Patient continues to make steady progress towards goals and has met 1 of 9 long term goals; home ambulation and home w/c mobility goals D/C secondary to patient now being SNF placement.  Patient is currently supervision-min A for bed mobility, min-mod A bed <> w/c transfers, supervision w/c mobility in controlled environment still requiring cues for attention to L environment and for safety with obstacle negotiation, mod-max A for floor > furniture transfers and still requires +2 A for gait with RW and stair negotiation.   Patient continues to demonstrate the following deficits: L sided weakness, L inattention, impaired motor control, timing and sequencing, motor apraxia, impaired cognition, impaired dynamic balance, postural control, gait and therefore will continue to benefit from skilled PT intervention to enhance overall performance with balance, postural control, ability to compensate for deficits, functional use of  left upper extremity and left lower extremity, attention, awareness and coordination.    Patient progressing toward long term goals..  Plan of care revisions: Patient to D/C to SNF secondary to lack of 24/7 supervision/assistance; D/C home ambulation and w/c mobility goal.  PT Short Term Goals  Week 3:  PT Short Term Goal 1 (Week 3): = LTG  Skilled Therapeutic Interventions/Progress Updates:   Attempted pre-gait training with floor ladder for visual cues for increased step length and anterior translation of COG but step length limited by decreased L DF ROM; returned to sitting and changed to NMR in standing with feet on wedge during stand > squat reaching low and to the L for cones with LUE and then RUE and then coming to a stand and activation of LLE extensors to place cone on tall  target to R x 12 reps with mod A.  Returned to standing and performed forward and retro stepping with R and LLE over visual target to facilitate full step length, anterior weight shift through closed chain tibial anterior translation with bilat UE support and mod-max A but with improved LLE hip and knee flexion during swing and improved ankle DF ROM.  Therapy Documentation Precautions:  Precautions Precautions: Fall Precaution Comments: Lt hemiparesis and LUE sensory deficits, Lt visual neglect Restrictions Weight Bearing Restrictions: No Vital Signs: Therapy Vitals Temp: 97.6 F (36.4 C) Temp src: Oral Pulse Rate: 53  Resp: 20  BP: 118/78 mmHg Patient Position, if appropriate: Lying Oxygen Therapy SpO2: 97 % O2 Device: None (Room air) Pain:  No c/o pain Locomotion : Wheelchair Mobility Distance: 150 supervision with decreased cues needed for guidance  See FIM for current functional status  Therapy/Group: Individual Therapy  Anthony Hardin 12/05/2011, 10:00 AM

## 2011-12-05 NOTE — Progress Notes (Signed)
Speech Language Pathology Daily Session Note  Patient Details  Name: Anthony Hardin MRN: [id number] Date of Birth: 02-28-1951  Today's Date: 12/05/2011 Time: 0950-1020 Time Calculation (min): 30 min  Short Term Goals: Week 3: SLP Short Term Goal 1 (Week 3): Patient will sustain attention to a basic familiar task for 5 minutes with min multimodal cues. SLP Short Term Goal 2 (Week 3): Patient will demonstrate emergent awareness by asking for help when needed with mod assist verbal cues. SLP Short Term Goal 3 (Week 3): Patient will solve basic familiar problems with min assist multimodal cues. SLP Short Term Goal 4 (Week 3): Patient will consume regular textures and thin liquids with supervision cues to utilize compensatory strategies with no overt s/s of aspiration.  Skilled Therapeutic Interventions: Treatment targeted cognition goals during an advanced ADL. SLP facilitated the session with supervision to alternate attention in a distracting environment while playing a card game where the patient was asked to hold the cards and attend to his left hand while also functionally problem solving. Patient also completed a complex financial management task with supervision cuing to balance a checkbook.     FIM:  Comprehension Comprehension Mode: Auditory Comprehension: 5-Understands complex 90% of the time/Cues < 10% of the time Expression Expression Mode: Verbal Expression: 5-Expresses complex 90% of the time/cues < 10% of the time Social Interaction Social Interaction: 5-Interacts appropriately 90% of the time - Needs monitoring or encouragement for participation or interaction. Problem Solving Problem Solving: 5-Solves complex 90% of the time/cues < 10% of the time Memory Memory: 5-Recognizes or recalls 90% of the time/requires cueing < 10% of the time  Pain Pain Assessment Pain Assessment: No/denies pain  Therapy/Group: Individual Therapy  [name], Conrad [place]  Graduate  Clinician Speech Language Pathology   Page, [name] 12/05/2011, 11:14 AM  The above skilled treatment note has been reviewed and SLP is in agreement. [name], M.A., CCC-SLP [phone]

## 2011-12-05 NOTE — Progress Notes (Signed)
Occupational Therapy Session Note  Patient Details  Name: Anthony Hardin MRN: [id number] Date of Birth: [birth year]  Today's Date: 12/05/2011 Time: 0815-0900 Time Calculation (min): 45 min  Short Term Goals: Week 3:  OT Short Term Goal 1 (Week 3): Pt will complete UB dressing with min assist with hemi-technique  OT Short Term Goal 2 (Week 3): Pt will complete grooming with min assist OT Short Term Goal 3 (Week 3): Pt will complete LB dressing with min assist OT Short Term Goal 4 (Week 3): Pt will complete toilet transfer with min assist 2 out of 3 attempts  Skilled Therapeutic Interventions/Progress Updates:    Pt seen for ADL retraining with focus on increased sequencing, initiation, and use of LUE with bathing and dressing.  Pt with increased spontaneous use of LUE with bathing, requiring facilitation at elbow to increase active ROM.  Walk-in shower transfer with min assist into shower using grab bar and mod assist out of shower.  Pt demonstrated increased problem solving and carryover with donning shirt with hemi-technique, continuing to require physical assist for thoroughness.  Therapy Documentation Precautions:  Precautions Precautions: Fall Precaution Comments: Lt hemiparesis and LUE sensory deficits, Lt visual neglect Restrictions Weight Bearing Restrictions: No Pain:  Pt with no c/o pain this session.  See FIM for current functional status  Therapy/Group: Individual Therapy  Leonette Monarch 12/05/2011, 10:17 AM

## 2011-12-06 ENCOUNTER — Inpatient Hospital Stay (HOSPITAL_COMMUNITY): Payer: BC Managed Care – PPO | Admitting: Occupational Therapy

## 2011-12-06 ENCOUNTER — Inpatient Hospital Stay (HOSPITAL_COMMUNITY): Payer: BC Managed Care – PPO | Admitting: Speech Pathology

## 2011-12-06 ENCOUNTER — Inpatient Hospital Stay (HOSPITAL_COMMUNITY): Payer: BC Managed Care – PPO

## 2011-12-06 ENCOUNTER — Inpatient Hospital Stay (HOSPITAL_COMMUNITY): Payer: BC Managed Care – PPO | Admitting: Physical Therapy

## 2011-12-06 NOTE — Progress Notes (Signed)
Occupational Therapy Weekly Progress Note and Treatment Session  Patient Details  Name: Anthony Hardin MRN: [id number] Date of Birth: [birth year]  Today's Date: 12/06/2011 Time: 0815-0900 Time Calculation (min): 45 min  Patient has met 3 of 4 short term goals.  Pt is showing tremendous progress towards goals with increased spontaneous use of LUE in functional tasks, requiring occasional facilitation at elbow to increase functionality of movements.  Pt demonstrating carryover with hemi-technique for bathing and LB dressing.  Pt continues to have difficulty with perceptual and sensory deficits impairing his ability to complete UB dressing.  Pt continues to have perceptual deficits with shaving, requiring increased assist to attend to Lt side of face.  Transfers and attention to tasks have greatly improved as well as carryover of techniques taught in previous sessions.  Goals have been upgraded from min assist-supervision to supervision overall.  Patient continues to demonstrate the following deficits: Lt sided weakness, Lt inattention, impaired motor control, impaired timing and sequencing, decreased dynamic standing balance, and therefore will continue to benefit from skilled OT intervention to enhance overall performance with BADL, iADL and Reduce care partner burden.  Patient progressing toward long term goals..  Plan of care revisions: Upgraded from min assist to supervision overall.  OT Short Term Goals Week 3:  OT Short Term Goal 1 (Week 3): Pt will complete UB dressing with min assist with hemi-technique  OT Short Term Goal 1 - Progress (Week 3): Progressing toward goal OT Short Term Goal 2 (Week 3): Pt will complete grooming with min assist OT Short Term Goal 2 - Progress (Week 3): Met OT Short Term Goal 3 (Week 3): Pt will complete LB dressing with min assist OT Short Term Goal 3 - Progress (Week 3): Met OT Short Term Goal 4 (Week 3): Pt will complete toilet transfer with min assist 2  out of 3 attempts OT Short Term Goal 4 - Progress (Week 3): Met Week 4:  OT Short Term Goal 1 (Week 4): Pt will complete UB dressing with min assist with hemi-technique  OT Short Term Goal 2 (Week 4): Pt will complete bathing at sit to stand level in shower with supervision OT Short Term Goal 3 (Week 4): Pt will complete LB dressing at sit to stand level with supervision OT Short Term Goal 4 (Week 4): Pt will complete grooming with min assist in standing  Skilled Therapeutic Interventions/Progress Updates:    Pt completed bathing at walk-in shower level with focus on increased safety and independence with transfer, bathing, and dressing.  Repositioned tub bench to facing grab bar instead of out of shower to provide increased access to grab bar and attempt to increase independence and safety.  Pt completed walk-in shower transfer with min assist and use of grab bar with sidestepping technique with increased safety.  Pt completed bathing at overall min assist with min cues for sequencing and facilitation at Lt elbow to increase functional movements to assist in bathing.  Pt with increased safety with sit <> stand for bathing of periarea with grab bar in front instead of to side.  Pt continues to have perceptual difficulties with UB dressing, requiring assist to don Lt arm/initiate dressing. Pt demonstrating increased carryover of techniques from previous sessions with only min cues.  Therapy Documentation Precautions:  Precautions Precautions: Fall Precaution Comments: Lt hemiparesis and LUE sensory deficits, Lt visual neglect Restrictions Weight Bearing Restrictions: No Pain: Pain Assessment Pain Assessment: No/denies pain Pain Score:   2 Pain Type: Acute pain Pain  Location: Shoulder Pain Orientation: Left Pain Intervention(s): Repositioned ADL: ADL Eating: Supervision/safety;Set up Where Assessed-Eating: Wheelchair Grooming: Minimal assistance Where Assessed-Grooming: Sitting at  sink;Wheelchair Upper Body Bathing: Minimal cueing;Minimal assistance Where Assessed-Upper Body Bathing: Shower Lower Body Bathing: Minimal cueing;Minimal assistance Where Assessed-Lower Body Bathing: Shower Upper Body Dressing: Moderate assistance Where Assessed-Upper Body Dressing: Sitting at sink Lower Body Dressing: Minimal assistance;Minimal cueing Where Assessed-Lower Body Dressing: Sitting at sink;Standing at sink Toileting: Moderate assistance Where Assessed-Toileting: Teacher, adult education: Moderate assistance Toilet Transfer Method: Squat pivot Toilet Transfer Equipment: Engineer, technical sales: Not assessed Film/video editor: Minimal assistance;Minimal cueing Film/video editor Method: Warden/ranger: Transfer tub bench;Grab bars ADL Comments: Pt attending to LUE more in functional tasks, continues to have extreme sensory loss.  Pt with increased initiation and spontaneous use of LUE in functional self-care tasks.  Pt continues to have perceptual difficultyies with UB dressing. Exercises:   Other Treatments:    See FIM for current functional status  Therapy/Group: Individual Therapy  Leonette Monarch 12/06/2011, 2:51 PM

## 2011-12-06 NOTE — Progress Notes (Signed)
Social Work Patient ID: Anthony Hardin, male   DOB: 08-22-1950, 61 y.o.   MRN: [id number] Met with pt and spoke with Jenny-sister and Gary-brother to inform team conference progression toward goals and plan. All feel he should stay here to get the most optimal rehab and then come up with a plan for home or going to one of their homes. [employer] will continue to expect updates but are aware of the plan and at this time in agreement with.  Pt and family aware insurance will not Cover NHP if pt stays here longer.  Discussed pt's revised goals for close supervision level.  Discussed OP, home health, hired assistance, etc. All pleased with the progress pt has made and seems to come daily.  Work toward discharge.

## 2011-12-06 NOTE — Progress Notes (Signed)
Occupational Therapy Session Note  Patient Details  Name: Anthony Hardin MRN: [id number] Date of Birth: 1950-05-19  Today's Date: 12/06/2011 Time: 9562-1308 Time Calculation (min): 45 min   Skilled Therapeutic Interventions/Progress Updates:    Worked on LUE strengthening and AROM.  Began with shoulder mobilizations in supine anterior/ posterior.  Progressed to using therapy ball in supine and having pt work on bilateral shoulder flexion holding ball.  Pt with difficulty maintaining elbow extension during ROM.  Needs visual attention on the UE during all movement secondary to decreased proprioception.  Shoulder flexion limited to approximately 130 degrees secondary to shoulder pain.  Noted pt with increased tightness in pectoral during shoulder movement.  Transitioned to prone with arms at side to work on scapular retraction exercises.  Pt performed 3 sets of 10 repetitions.  Finished with pt in sitting and using the LUE to reach for rings at knee level on the ROM arc and move to the other side.  Pt with decreased timing of shoulder flexion and elbow extension.  Scapular winging also noted and pt needing min assist to complete task.  Therapy Documentation Precautions:  Precautions Precautions: Fall Precaution Comments: Lt hemiparesis and LUE sensory deficits, Lt visual neglect Restrictions Weight Bearing Restrictions: No  Pain: Pain Assessment Pain Assessment: No/denies pain Pain Score:   2 Pain Type: Acute pain Pain Location: Shoulder Pain Orientation: Left Pain Intervention(s): Repositioned ADL: See FIM for current functional status  Therapy/Group: Individual Therapy  Deanglo Hissong OTR/L 12/06/2011, 12:21 PM

## 2011-12-06 NOTE — Progress Notes (Signed)
Patient ID: Anthony Hardin, male   DOB: [birth year], 61 y.o.   MRN: [id number] Admitted 10/31/2011 with diffuse weakness and unable to move and fell out of bed onto the floor. MRI of the brain showed a 4 x 1 x 2.5 cm acute hematoma right thalamus with mass effect and displacement distortion of the third ventricle but no suspicion of obstructive hydrocephalus. MRA of the head with no stenosis or occlusion.   Subjective/Complaints: No pain, slept well.  Team conference todaty see report  Review of Systems  Musculoskeletal: Negative for myalgias.  Neurological: Positive for sensory change and focal weakness.  All other systems reviewed and are negative.   Objective: Vital Signs: Blood pressure 116/69, pulse 62, temperature 98.1 F (36.7 C), temperature source Oral, resp. rate 20, height 5\' 9"  (1.753 m), weight 72.4 kg (159 lb 9.8 oz), SpO2 98.00%. No results found. No results found for this or any previous visit (from the past 72 hour(s)).   HEENT: normal Cardio: RRR Resp: CTA B/L GI: BS positive Extremity:  No Edema Skin:   Intact Neuro: Confused, Abnormal Sensory absent sensation L side lt touch and proprio, Abnormal Motor 2/5 finger flex and arm ext, 2/5 hip/knee extensory synergy,0/5 L Ankle Abnormal FMC Reflexes 3+, Reflexes: 3+, Tone:  Hypertonia, Inattention and Other L homonymous hemianopsia Musc/Skel:  Normal  Sensation absent L arm and leg Assessment/Plan: 1. Functional deficits secondary to R thalamic bleed which require 3+ hours per day of interdisciplinary therapy in a comprehensive inpatient rehab setting. Physiatrist is providing close team supervision and 24 hour management of active medical problems listed below. Physiatrist and rehab team continue to assess barriers to discharge/monitor patient progress toward functional and medical goals. FIM: FIM - Bathing Bathing Steps Patient Completed: Chest;Left Arm;Abdomen;Front perineal area;Buttocks;Right upper leg;Left  upper leg Bathing: 3: Mod-Patient completes 5-7 75f 10 parts or 50-74%  FIM - Upper Body Dressing/Undressing Upper body dressing/undressing steps patient completed: Thread/unthread right sleeve of pullover shirt/dresss;Put head through opening of pull over shirt/dress Upper body dressing/undressing: 3: Mod-Patient completed 50-74% of tasks FIM - Lower Body Dressing/Undressing Lower body dressing/undressing steps patient completed: Thread/unthread right underwear leg;Thread/unthread left underwear leg;Pull underwear up/down;Thread/unthread right pants leg;Thread/unthread left pants leg;Pull pants up/down;Don/Doff right shoe;Don/Doff left shoe Lower body dressing/undressing: 4: Min-Patient completed 75 plus % of tasks  FIM - Toileting Toileting steps completed by patient: Adjust clothing prior to toileting;Performs perineal hygiene Toileting Assistive Devices: Grab bar or rail for support Toileting: 3: Mod-Patient completed 2 of 3 steps  FIM - Diplomatic Services operational officer Devices: Grab bars Toilet Transfers: 4-To toilet/BSC: Min A (steadying Pt. > 75%);4-From toilet/BSC: Min A (steadying Pt. > 75%)  FIM - Bed/Chair Transfer Bed/Chair Transfer Assistive Devices: Bed rails Bed/Chair Transfer: 5: Supine > Sit: Supervision (verbal cues/safety issues);4: Bed > Chair or W/C: Min A (steadying Pt. > 75%);4: Chair or W/C > Bed: Min A (steadying Pt. > 75%)  FIM - Locomotion: Wheelchair Distance: 150 Locomotion: Wheelchair: 5: Travels 150 ft or more: maneuvers on rugs and over door sills with supervision, cueing or coaxing FIM - Locomotion: Ambulation Locomotion: Ambulation Assistive Devices: Designer, industrial/product Ambulation/Gait Assistance: 1: +2 Total assist Locomotion: Ambulation: 0: Activity did not occur  Comprehension Comprehension Mode: Auditory Comprehension: 5-Understands complex 90% of the time/Cues < 10% of the time  Expression Expression Mode: Verbal Expression:  5-Expresses complex 90% of the time/cues < 10% of the time  Social Interaction Social Interaction: 5-Interacts appropriately 90% of the time - Needs  monitoring or encouragement for participation or interaction.  Problem Solving Problem Solving: 5-Solves basic 90% of the time/requires cueing < 10% of the time  Memory Memory: 5-Recognizes or recalls 90% of the time/requires cueing < 10% of the time  2. Anticoagulation/DVT prophylaxis with non-Pharmaceutical:  Medical Problem List and Plan:  1. 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 DVT Prophylaxis/Anticoagulation: Subcutaneous Lovenox initiated 10/31/2011. Monitor platelet counts and any signs of bleeding  3. Mood: Continue Ritalin, now is on bid. Sleep-wake cycles have normalized may d/d sleep graph 4. Neuropsych: This patient is  capable of making decisions on his/her own behalf at this time.He has  poor awareness of deficits and grossly overestimates his physical and cognitive abilities  5. Hypertension. Norvasc 10 mg daily, clonidine patch 0.1 mg weekly, hydrochlorothiazide 25 mg daily, lisinopril 20 mg a.m. and 10 mg at bedtime. Monitor with increased mobility  6. Dysphagia. Mechanical soft diet thin liquids. Speech therapy followup. Monitor for any signs of aspiration.   LOS (Days) 22 A FACE TO FACE EVALUATION WAS PERFORMED  Carr Shartzer E 12/06/2011, 7:42 AM

## 2011-12-06 NOTE — Progress Notes (Signed)
Speech Language Pathology Daily Session Note and Weekly Progress Note  Patient Details  Name: Anthony Hardin MRN: [id number] Date of Birth: 06-29-1950  Today's Date: 12/06/2011 Time: 9629-5284 Time Calculation (min): 45 min  Short Term Goals: Week 3: SLP Short Term Goal 1 (Week 3): Patient will sustain attention to a basic familiar task for 5 minutes with min multimodal cues. SLP Short Term Goal 1 - Progress (Week 3): Met SLP Short Term Goal 2 (Week 3): Patient will demonstrate emergent awareness by asking for help when needed with mod assist verbal cues. SLP Short Term Goal 2 - Progress (Week 3): Met SLP Short Term Goal 3 (Week 3): Patient will solve basic familiar problems with min assist multimodal cues. SLP Short Term Goal 3 - Progress (Week 3): Met SLP Short Term Goal 4 (Week 3): Patient will consume regular textures and thin liquids with supervision cues to utilize compensatory strategies with no overt s/s of aspiration. SLP Short Term Goal 4 - Progress (Week 3): Met  Skilled Therapeutic Interventions: Treatment session addressed cognitive goals during a self care task.  Patient sequenced and organized a functional task with min assist verbal cues.  SLP facilitated the session with min assist verbal cues to initiate and complete a moderately complex ADL in a timely manner.  Cues were faded to supervision by the end of the task.  Patient demonstrated emergent awareness as evidenced by him verbalizing safety strategies that he was planning to use to complete a cooking task.  Patient also exhibited improved self-monitoring and correcting when utilizing his safety strategies in the kitchen with supervision.      FIM:  Comprehension Comprehension Mode: Auditory Comprehension: 5-Understands complex 90% of the time/Cues < 10% of the time Expression Expression Mode: Verbal Expression: 5-Expresses complex 90% of the time/cues < 10% of the time Social Interaction Social Interaction:  5-Interacts appropriately 90% of the time - Needs monitoring or encouragement for participation or interaction. Problem Solving Problem Solving: 5-Solves basic 90% of the time/requires cueing < 10% of the time Memory Memory: 5-Recognizes or recalls 90% of the time/requires cueing < 10% of the time  Pain Pain Assessment Pain Assessment: No/denies pain  Therapy/Group: Individual Therapy   Elad Macphail 12/07/2011, 8:32 AM   Speech Language Pathology Weekly Progress Note  Patient Details  Name: Anthony Hardin MRN: [id number] Date of Birth: 11-14-1950  Today's Date: 12/06/2011  Short Term Goals: Week 3: SLP Short Term Goal 1 (Week 3): Patient will sustain attention to a basic familiar task for 5 minutes with min multimodal cues. SLP Short Term Goal 1 - Progress (Week 3): Met SLP Short Term Goal 2 (Week 3): Patient will demonstrate emergent awareness by asking for help when needed with mod assist verbal cues. SLP Short Term Goal 2 - Progress (Week 3): Met SLP Short Term Goal 3 (Week 3): Patient will solve basic familiar problems with min assist multimodal cues. SLP Short Term Goal 3 - Progress (Week 3): Met SLP Short Term Goal 4 (Week 3): Patient will consume regular textures and thin liquids with supervision cues to utilize compensatory strategies with no overt s/s of aspiration. SLP Short Term Goal 4 - Progress (Week 3): Met Week 4: SLP Short Term Goal 1 (Week 4): Patient will alternate attention during a basic familiar task for 30 minutes with modified independence.  SLP Short Term Goal 2 (Week 4): Patient will demonstrate emergent awareness by asking for help when needed with supervision cues.  SLP Short Term Goal 3 (Week  4): Patient will solve complex functional problems with supervision cues.  Weekly Progress Updates: Patient met 4 out of 4 short term goals this reporting period with gains in attention and problem solving. Patient is tolerating regular textures and thin  liquids with modified independence. Patient demonstrated alternating attention during functional tasks for a period of thirty minutes with supervision cues to initiate, transition, and complete task in a timely manner. Overall, patient exhibited increased emergent awareness with min assist cues to ask for help with moderately complex to complex functional tasks when appropriate. Patient also demonstrated improved complex problem solving during functional tasks with min assist to supervision cues.  Patient will continue to benefit from intensive speech therapy to maximize functional independence and safety with basic ADLs and reduce burden of care upon discharge   SLP Frequency: 1-2 X/day, 30-60 minutes;5 out of 7 days Estimated Length of Stay: anticipated discharge date 12/26/2011 SLP Treatment/Interventions: Cognitive remediation/compensation;Cueing hierarchy;Environmental controls;Internal/external aids;Functional tasks;Multimodal communication approach;Patient/family education;Therapeutic Activities  [name], Conrad Hopkins.  Graduate Clinician Speech Language Pathology  Rigby Swamy 12/07/2011, 8:32 AM  The above skilled treatment note has been reviewed and SLP is in agreement. [name], M.A., CCC-SLP [phone]

## 2011-12-06 NOTE — Progress Notes (Signed)
Physical Therapy Session Note  Patient Details  Name: Anthony Hardin MRN: [id number] Date of Birth: 25-Feb-1951  Today's Date: 12/06/2011 Time: 7829-5621 Time Calculation (min): 45 min  Short Term Goals: Week 3:  PT Short Term Goal 1 (Week 3): = LTG  Skilled Therapeutic Interventions/Progress Updates:   Per conference patient's D/C plan changed to home with 24/7 supervision with increased LOS to 10/15.  Goals adjusted secondary to change in D/C plan  Gait training: Assessed patient's gait with addition of L blue rocker with 1/4 inch heel wedge to assist with anterior translation of COG x 25' x 2 forwards and retro stepping with RUE on wall rail and min-mod A with improved step and stride length and decreased L genu recurvatum.   NMR: performed hamstring coordination, motor control training during retro stepping in squat position for increased knee control and use of hamstring for LE extension with mod A overall x 25'    Therapy Documentation Precautions:  Precautions Precautions: Fall Precaution Comments: Lt hemiparesis and LUE sensory deficits, Lt visual neglect Restrictions Weight Bearing Restrictions: No Pain: Pain Assessment Pain Assessment: No/denies pain Locomotion : Ambulation Ambulation/Gait Assistance: 4: Min assist;3: Mod assist Wheelchair Mobility Distance: 150   See FIM for current functional status  Therapy/Group: Individual Therapy  [name] [facility] 12/06/2011, 11:26 AM

## 2011-12-06 NOTE — Patient Care Conference (Signed)
Inpatient RehabilitationTeam Conference Note Date: 12/06/2011   Time: 11:00 AM   Patient Name: Anthony Hardin      Medical Record Number: [id number]  Date of Birth: 1950-07-05 Sex: Male         Room/Bed: 4025/4025-01 Payor Info: Payor: BLUE CROSS BLUE SHIELD  Plan: [facility] HEALTH PPO  Product Type: *No Product type*     Admitting Diagnosis: RT HEMM   Admit Date/Time:  11/14/2011  5:35 PM Admission Comments: No comment available   Primary Diagnosis:  Thalamic hemorrhage Principal Problem: Thalamic hemorrhage  Patient Active Problem List   Diagnosis Date Noted  . 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 Intracerebral hemorrhage 10/31/2011  . 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 Hemiplegia, unspecified, affecting nondominant side 10/31/2011    Expected Discharge Date: Expected Discharge Date: 12/26/11  Team Members Present: Physician: Dr. Claudette Laws Social Worker Present: Dossie Der, LCSW Nurse Present: Laural Roes, RN PT Present: [name], PT OT Present: Bretta Bang, Verlene Mayer, OT SLP Present: [name], SLP Other (Discipline and Name): Charolette Child Coordinator     Current Status/Progress Goal Weekly Team Focus  Medical   Level of alertness is improved, awareness of left side still impaired but improving  Reduce Ritalin  Assessment status Ritalin   Bowel/Bladder   continent of B+B  continent  monitor   Swallow/Nutrition/ Hydration   regular textures and thin liquids, intermittent supervision/set up   upgraded to modified independence   increase alternating attention, facilitate carryover of strategies    ADL's   mod assist bathing and UB dressing, min assist LB dressing, min assist grooming, min-mod assist transfers  min assist overall  Lt attention, increased integration of LUE in self-care tasks, increased carryover of techniques and increased initiation/sequencing with self-care tasks   Mobility   min-mod A overall except supervision w/c  mobility  supervision-min A overall, home ambulation and w/c D/C secondary to SNF placement  standing balance, gait, L attention and strength   Communication             Safety/Cognition/ Behavioral Observations  min-mod assist   supervision-min assist   increase alternating attention, self monitoring and self correcting    Pain   n/a  n/a  n/a   Skin   n/a  n/a  n/a      *See Interdisciplinary Assessment and Plan and progress notes for long and short-term goals  Barriers to Discharge: No local 24-hour care    Possible Resolutions to Barriers:  SNF versus hired care post discharge. May extend rehabilitation stay to achieve  supervision levels    Discharge Planning/Teaching Needs:  Family wants pt to stay until 10/1 then NHP.  Insurance is willing to have pt stay longer but then no SNF coverage.  Discuss with team and MD best use of pt's resources-also include pt/family in decision      Team Discussion:  Pt making good gain in therapies.  Upgrade goals-attention, carryover and sequencing much better.  Decrease ritalin due to alert now.   Revisions to Treatment Plan:  Return home with family member-providing assistance   Continued Need for Acute Rehabilitation Level of Care: The patient requires daily medical management by a physician with specialized training in physical medicine and rehabilitation for the following conditions: Daily direction of a multidisciplinary physical rehabilitation program to ensure safe treatment while eliciting the highest outcome that is of practical value to the patient.: Yes Daily medical management of patient stability for increased activity during participation in an intensive  rehabilitation regime.: Yes Daily analysis of laboratory values and/or radiology reports with any subsequent need for medication adjustment of medical intervention for : Neurological problems  Yaxiel Minnie, [name] 12/06/2011, 2:41 PM

## 2011-12-07 ENCOUNTER — Inpatient Hospital Stay (HOSPITAL_COMMUNITY): Payer: BC Managed Care – PPO

## 2011-12-07 ENCOUNTER — Inpatient Hospital Stay (HOSPITAL_COMMUNITY): Payer: BC Managed Care – PPO | Admitting: Occupational Therapy

## 2011-12-07 ENCOUNTER — Inpatient Hospital Stay (HOSPITAL_COMMUNITY): Payer: BC Managed Care – PPO | Admitting: *Deleted

## 2011-12-07 ENCOUNTER — Inpatient Hospital Stay (HOSPITAL_COMMUNITY): Payer: BC Managed Care – PPO | Admitting: Speech Pathology

## 2011-12-07 DIAGNOSIS — G8110 Spastic hemiplegia affecting unspecified side: Secondary | ICD-10-CM

## 2011-12-07 DIAGNOSIS — Z5189 Encounter for other specified aftercare: Secondary | ICD-10-CM

## 2011-12-07 DIAGNOSIS — I619 Nontraumatic intracerebral hemorrhage, unspecified: Secondary | ICD-10-CM

## 2011-12-07 MED ORDER — GABAPENTIN 100 MG PO CAPS
100.0000 mg | ORAL_CAPSULE | Freq: Every day | ORAL | Status: DC
  Administered 2011-12-07 – 2011-12-25 (×19): 100 mg via ORAL
  Filled 2011-12-07 (×20): qty 1

## 2011-12-07 NOTE — Progress Notes (Signed)
NEUROCOGNITIVE TESTING - CONFIDENTIAL  Inpatient Rehabilitation   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  According to his medical record, he was admitted on 10/31/2011 with diffuse weakness and unable to move and fell out of bed onto the floor.  An initial MRI of the brain showed an acute hematoma in the right thalamus with mass effect and displacement, distortion of the third ventricle, but no suspicion of hydrocephalus.   He was followed with serial CT scans.  His latest scan (11/04/2011) demonstrated stable left hemorrhage involving right thalamus with stable surrounding vasogenic edema.  Bouts of diplopia and left inattention were noted.    He was previously evaluated with brief neuropsychological testing on 11/23/2011.  Results from that evaluation are as follows:  RBANS (form A) Total Score = 78 (7th percentile); GDS = 1/15; GAI = 1/20.   Today, he was seen for re-evaluation to track his cognitive progress since his last evaluation.     PROCEDURES: [3 units of 16109 on 12/06/11]  The following tests were performed during today's visit: Repeatable Battery for the Assessment of Neuropsychological Status (RBANS, form B), Geriatric Anxiety Inventory, and the Geriatric Depression Scale (short form).  Test results are as follows:   RBANS Indices Scaled Score Percentile Description  Immediate Memory  97 42 Average  Visuospatial/Constructional 109 73 Average  Language 94 34 Average  Attention 8 21 Below Average  Delayed Memory 94 34 Average  Total Score 94 34 Average   RBANS Subtests Raw Score Percentile Description  List Learning 25 30 Average  Story Memory 20 75 Average  Figure Copy 19 70 Average  Line Orientation 18 70 Average  Picture Naming 10 75 Average  Semantic Fluency 18 27 Average  Digit Span 13 84 Above Average  Coding  24 1 Profoundly Impaired  List Recall 4 16 Below Average  List Recognition 19 32 Average  Story Recall 11 81 Above Average  Figure recall 8 5 Impaired   Geriatric Depression Scale (short form) Raw Score = 0/15 Description = WNL   Geriatric Anxiety Inventory Raw Score = 0/20 Description = WNL   Compared to previous testing, these results represent significant improvement in most cognitive domains.  Greatest areas of improvement were noted in immediate rote memory, visuospatial abilities, and semantic fluency.  Although improved from his prior performances, he continues to demonstrate impairment in areas of complex attention and visual memory.  Additionally, while not impaired, his delayed memory for rote information continues to represent likely reduction from his baseline potential.  [name]'s mood continues to be an area of strength, as he is not endorsing symptoms of significant depression or anxiety, which is likely serving as a protective factor.    In light of these findings, the following recommendations are provided.    RECOMMENDATIONS:  Recommendations for treatment team:     [name] may find benefit in practicing setting a strategy for how he will accomplish tasks, including how he will remember important information.  The more he practices developing such strategies, the easier it will become.     [name] continues to demonstrate significantly improved memory for contextual information.  As such, staff may find it useful to phrase important information within a context or story whenever possible to improve his memory for the information.     When interacting with [name], directions and information should be provided in  a simple, straight forward manner, and the treatment team should avoid giving multiple instructions simultaneously.    To the extent possible, multitasking should be avoided.   Performance will generally be best in a structured, routine, and familiar  environment, as opposed to situations involving complex problems.   Recommendations for discharge planning:    Complete a comprehensive neuropsychological evaluation as an outpatient in 6-12 months to assess for interval change.  Per his request, contact information for Dr. Wylene Simmer was provided for this purpose.     Maintain engagement in mentally, physically and cognitively stimulating activities.    Strive to maintain a healthy lifestyle (e.g., proper diet and exercise) in order to promote physical, cognitive and emotional health.   Anthony Hardin, Psy.D.  Clinical Neuropsychologist

## 2011-12-07 NOTE — Progress Notes (Signed)
Patient ID: Anthony Hardin, male   DOB: [birth year], 61 y.o.   MRN: [id number] Admitted 10/31/2011 with diffuse weakness and unable to move and fell out of bed onto the floor. MRI of the brain showed a 4 x 1 x 2.5 cm acute hematoma right thalamus with mass effect and displacement distortion of the third ventricle but no suspicion of obstructive hydrocephalus. MRA of the head with no stenosis or occlusion.   Subjective/Complaints: No pain, slept well.   "I like ritalin" Review of Systems  Musculoskeletal: Negative for myalgias.  Neurological: Positive for sensory change and focal weakness.  All other systems reviewed and are negative.   Objective: Vital Signs: Blood pressure 119/69, pulse 56, temperature 98 F (36.7 C), temperature source Oral, resp. rate 19, height 5\' 9"  (1.753 m), weight 69.8 kg (153 lb 14.1 oz), SpO2 98.00%. No results found. No results found for this or any previous visit (from the past 72 hour(s)).   HEENT: normal Cardio: RRR Resp: CTA B/L GI: BS positive Extremity:  No Edema Skin:   Intact Neuro: Confused, Abnormal Sensory absent sensation L side lt touch and proprio, Abnormal Motor 2/5 finger flex and arm ext, 2/5 hip/knee extensory synergy,0/5 L Ankle Abnormal FMC Reflexes 3+, Reflexes: 3+, Tone:  Hypertonia, Inattention and Other L homonymous hemianopsia Musc/Skel:  Normal  Sensation absent L arm and leg Assessment/Plan: 1. Functional deficits secondary to R thalamic bleed which require 3+ hours per day of interdisciplinary therapy in a comprehensive inpatient rehab setting. Physiatrist is providing close team supervision and 24 hour management of active medical problems listed below. Physiatrist and rehab team continue to assess barriers to discharge/monitor patient progress toward functional and medical goals. FIM: FIM - Bathing Bathing Steps Patient Completed: Chest;Right Arm;Left Arm;Abdomen;Front perineal area;Buttocks;Right upper leg;Left upper  leg;Right lower leg (including foot);Left lower leg (including foot) Bathing: 4: Min-Patient completes 8-9 28f 10 parts or 75+ percent  FIM - Upper Body Dressing/Undressing Upper body dressing/undressing steps patient completed: Thread/unthread right sleeve of pullover shirt/dresss;Put head through opening of pull over shirt/dress;Pull shirt over trunk Upper body dressing/undressing: 4: Min-Patient completed 75 plus % of tasks FIM - Lower Body Dressing/Undressing Lower body dressing/undressing steps patient completed: Thread/unthread right underwear leg;Thread/unthread left underwear leg;Pull underwear up/down;Thread/unthread right pants leg;Thread/unthread left pants leg Lower body dressing/undressing: 4: Min-Patient completed 75 plus % of tasks  FIM - Toileting Toileting steps completed by patient: Adjust clothing prior to toileting;Performs perineal hygiene Toileting Assistive Devices: Grab bar or rail for support Toileting: 3: Mod-Patient completed 2 of 3 steps  FIM - Diplomatic Services operational officer Devices: Grab bars Toilet Transfers: 4-To toilet/BSC: Min A (steadying Pt. > 75%);4-From toilet/BSC: Min A (steadying Pt. > 75%)  FIM - Bed/Chair Transfer Bed/Chair Transfer Assistive Devices: Bed rails Bed/Chair Transfer: 4: Supine > Sit: Min A (steadying Pt. > 75%/lift 1 leg);4: Sit > Supine: Min A (steadying pt. > 75%/lift 1 leg)  FIM - Locomotion: Wheelchair Distance: 150 Locomotion: Wheelchair: 5: Travels 150 ft or more: maneuvers on rugs and over door sills with supervision, cueing or coaxing FIM - Locomotion: Ambulation Locomotion: Ambulation Assistive Devices: Other (comment);Orthosis (wall rail) Ambulation/Gait Assistance: 4: Min assist;3: Mod assist Locomotion: Ambulation: 1: Travels less than 50 ft with moderate assistance (Pt: 50 - 74%)  Comprehension Comprehension Mode: Auditory Comprehension: 5-Understands complex 90% of the time/Cues < 10% of the  time  Expression Expression Mode: Verbal Expression: 5-Expresses complex 90% of the time/cues < 10% of the time  Social Interaction  Social Interaction: 5-Interacts appropriately 90% of the time - Needs monitoring or encouragement for participation or interaction.  Problem Solving Problem Solving: 5-Solves basic 90% of the time/requires cueing < 10% of the time  Memory Memory: 5-Recognizes or recalls 90% of the time/requires cueing < 10% of the time  2. Anticoagulation/DVT prophylaxis with non-Pharmaceutical:  Medical Problem List and Plan:  1. 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 DVT Prophylaxis/Anticoagulation: Subcutaneous Lovenox initiated 10/31/2011. Monitor platelet counts and any signs of bleeding  3. Mood: Continue Ritalin, now is on bid. Sleep-wake cycles have normalized may d/csleep graph D/C ritalin and monitor 4. Neuropsych: This patient is  capable of making decisions on his/her own behalf at this time.He has  poor awareness of deficits and grossly overestimates his physical and cognitive abilities  5. Hypertension. Norvasc 10 mg daily, clonidine patch 0.1 mg weekly, hydrochlorothiazide 25 mg daily, lisinopril 20 mg a.m. and 10 mg at bedtime. Monitor with increased mobility  6. Dysphagia. Mechanical soft diet thin liquids. Speech therapy followup. Monitor for any signs of aspiration.  7.  Sensory dysesthesia on L side. ? Recovery of sensation vs onset of thalamic pain syndrome, trial gabapentin at night  LOS (Days) 23 A FACE TO FACE EVALUATION WAS PERFORMED  [name] 12/07/2011, 7:26 AM

## 2011-12-07 NOTE — Progress Notes (Signed)
Speech Language Pathology Daily Session Note  Patient Details  Name: Anthony Hardin MRN: [id number] Date of Birth: [birth year]  Today's Date: 12/07/2011 Time: 1005-1035 Time Calculation (min): 30 min  Short Term Goals: Week 4: SLP Short Term Goal 1 (Week 4): Patient will alternate attention during a basic familiar task for 30 minutes with modified independence.  SLP Short Term Goal 2 (Week 4): Patient will demonstrate emergent awareness by asking for help when needed with supervision cues.  SLP Short Term Goal 3 (Week 4): Patient will solve complex functional problems with supervision cues.  Skilled Therapeutic Interventions:  Treatment addressed cognitive goals.  Patient alternated attention for 30 minutes with supervision cues while completing a moderately complex recall and categorical generation task.  Patient also exhibited increased self monitoring to initiate and complete the task in a timely manner with supervision cues.     FIM:  Comprehension Comprehension Mode: Auditory Comprehension: 5-Understands complex 90% of the time/Cues < 10% of the time Expression Expression Mode: Verbal Expression: 5-Expresses complex 90% of the time/cues < 10% of the time Social Interaction Social Interaction: 5-Interacts appropriately 90% of the time - Needs monitoring or encouragement for participation or interaction. Problem Solving Problem Solving: 5-Solves complex 90% of the time/cues < 10% of the time Memory Memory: 5-Recognizes or recalls 90% of the time/requires cueing < 10% of the time  Pain 0   Therapy/Group: Individual Therapy  [name], Conrad Waverly  Graduate Clinician Speech Language Pathology  Page, [name] 12/07/2011, 1:28 PM  The above skilled treatment note has been reviewed and SLP is in agreement. [name], M.A., CCC-SLP [phone]

## 2011-12-07 NOTE — Progress Notes (Signed)
Occupational Therapy Session Note  Patient Details  Name: Anthony Hardin MRN: [id number] Date of Birth: December 25, 1950  Today's Date: 12/07/2011 Time: 0815-0900 and 4098-1191 Time Calculation (min): 45 min and 33 min  Short Term Goals: Week 4:  OT Short Term Goal 1 (Week 4): Pt will complete UB dressing with min assist with hemi-technique  OT Short Term Goal 2 (Week 4): Pt will complete bathing at sit to stand level in shower with supervision OT Short Term Goal 3 (Week 4): Pt will complete LB dressing at sit to stand level with supervision OT Short Term Goal 4 (Week 4): Pt will complete grooming with min assist in standing  Skilled Therapeutic Interventions/Progress Updates:    1) Pt seen for ADL retraining with focus on increased carryover of hemi-technique and use of LUE with self-care tasks of bathing and dressing.  Pt completed side step transfer from w/c into walk-in shower with use of grab bar and tub bench with light min/steady assist.  Pt demonstrating increased safety awareness and attention to LLE with mobility.  Pt completed bathing with no cues for initiation or sequencing.  Pt demonstrated increased safety with standing to wash periarea.  Engaged in dressing at sink with mirror for visual cues.  Pt able to complete LB dressing with steady assist to maintain Lt leg crossed over knee while donning underwear and pants and steady assist in standing while pulling up pants.  UB dressing completed with increased attention to LUE and ability to don both arms without physical assist.  Pt completed shaving and oral hygiene with distant supervision, with increased ability to attend to Lt side of face.  2) 1:1 OT with focus on LUE NM re-ed in supine, sidelying, and standing.  Shoulder ROM in supine with noted tightness in pecs, pt also reports minor rotator cuff injury PTA.  Pt completed shoulder flexion and elbow extension in sidelying against min-mod resistance.  Completed gross motor and FMC  activity in standing with pt grasping cards with Lt hand and placing them on matching cards with focus on visually tracking movements and grading pressure.  Pt required cues to visually track and focus placed on timing of grasp and release.   Therapy Documentation Precautions:  Precautions Precautions: Fall Precaution Comments: Lt hemiparesis and LUE sensory deficits, Lt visual neglect Restrictions Weight Bearing Restrictions: No Pain:  Pt with no c/o pain this session.  See FIM for current functional status  Therapy/Group: Individual Therapy  Leonette Monarch 12/07/2011, 9:02 AM

## 2011-12-07 NOTE — Progress Notes (Signed)
Physical Therapy Session Note  Patient Details  Name: Anthony Hardin MRN: [id number] Date of Birth: 1950-12-31  Today's Date: 12/07/2011 Time: 1105-1205 Time Calculation (min): 60 min  Skilled Therapeutic Interventions/Progress Updates:    Tx focused on gait training with RW, dynamic balance, and transfer training.  Pt propelled WC gym <>room with S only and cues for parts management and turns for safety.   Gait training with L AFO and heel lift with RW and L grip: 1x100' with Mod A overall, but Max A at times, especially with increased speed. Pt requires increased time to focus on placement of R/L feet, and tends to look at feet during gait due to lack of proprioception. L foot placement degraded, unable to accomplish swing through when pt not looking. Assist needed for balance as well as L knee support due to some genu recurvatum. Pt challenged to increase pace last half of walk as fast as safely possibly. While gait quality degraded, this was a good challenge for pt to stop overthinking task.    Step-taps to 4" step without UE assist as able, occasionally holding on to second therapist on R for challenging tasks, targeting numbers on step for variety of balance positions with R/L LE x10 each and up to Max A when L LE on step farther to L. Pt unable to recover from posterior LOB without assist. Pt had difficulty lifting foot to step due to L hip flexor weakness.  Progressed to R/L stepping to numbered circles forwards and laterally with quick changes for increased balance reactions. Pt needing Mod A for this task, again difficulty lifting/placing LLE and bringing back to start position.   Challenged pt to propel WC with LLE only for increased HS activation x20' with Mod A for steering.   Therapy Documentation Precautions:  Precautions Precautions: Fall Precaution Comments: Lt hemiparesis and LUE sensory deficits, Lt visual neglect Restrictions Weight Bearing Restrictions: No      Locomotion : Ambulation Ambulation/Gait Assistance: 3: Mod assist;2: Max Lawyer Distance: 150  See FIM for current functional status  Therapy/Group: Individual Therapy  Virl Cagey, PT 12/07/2011, 12:25 PM

## 2011-12-07 NOTE — Progress Notes (Signed)
Physical Therapy Session Note  Patient Details  Name: AARNAV STEAGALL MRN: [id number] Date of Birth: [birth year]  Today's Date: 12/07/2011 Time: 1478-2956 Time Calculation (min): 32 min  Short Term Goals: Week 3:  PT Short Term Goal 1 (Week 3): = LTG  Skilled Therapeutic Interventions/Progress Updates:  Patient propelled wheelchair to and from gym with supervision and occasional cueing to attend to left. Patient ambulated with rolling walker and left hand splint 100 feet with moderate assistance for facilitation of swing with left LE and trunk control. Patient transferred to mat with min contact assist. Supine to and from sit with supervision. Therapist worked on Press photographer of left hip flexion at the initiation of swing. During second attempt at ambulation, patient was rotating to left and using adductors for swing. Patient required mod to max facilitation to initiate swing with hip flexion.  Therapy Documentation Precautions:  Precautions Precautions: Fall Precaution Comments: Lt hemiparesis and LUE sensory deficits, Lt visual neglect Restrictions Weight Bearing Restrictions: No  Pain: Pain Assessment Pain Assessment: No/denies pain  Locomotion : Ambulation Ambulation/Gait Assistance: 3: Mod assist;2: Max Lawyer Distance: 150   See FIM for current functional status  Therapy/Group: Individual Therapy  Arelia Longest M 12/07/2011, 1:54 PM

## 2011-12-08 ENCOUNTER — Inpatient Hospital Stay (HOSPITAL_COMMUNITY): Payer: BC Managed Care – PPO | Admitting: Occupational Therapy

## 2011-12-08 ENCOUNTER — Inpatient Hospital Stay (HOSPITAL_COMMUNITY): Payer: BC Managed Care – PPO | Admitting: Physical Therapy

## 2011-12-08 ENCOUNTER — Inpatient Hospital Stay (HOSPITAL_COMMUNITY): Payer: BC Managed Care – PPO | Admitting: Speech Pathology

## 2011-12-08 ENCOUNTER — Inpatient Hospital Stay (HOSPITAL_COMMUNITY): Payer: BC Managed Care – PPO | Admitting: *Deleted

## 2011-12-08 NOTE — Progress Notes (Signed)
Physical Therapy Session Note  Patient Details  Name: Anthony Hardin MRN: [id number] Date of Birth: [birth year]  Today's Date: 12/08/2011 Time: 0805-0850 Time Calculation (min): 45 min  Short Term Goals: Week 3:  PT Short Term Goal 1 (Week 3): = LTG  Skilled Therapeutic Interventions/Progress Updates:    NMR- wall push ups and squats with assist for maintaining position of LUE, standing alternating toe taps on 6 inch step with only L hand on rail- able to keep hand there without cues, A for balance (especially when in single leg stance on L)and facilitation to L knee and hip in SLS and to DF L foot to raise it to step; step up 6 inch step leading with LLE and backwards down first with R to work on LLE sterngth and control- facilitation to prevent knee hyperextension L- both hands on rails; progressed to steps leading with R or L without AFO min A  Therapy Documentation Precautions:  Precautions Precautions: Fall Precaution Comments: L neglect Restrictions Weight Bearing Restrictions: No  Pain: Pain Assessment Pain Assessment:  (no pain but c/o heaviness in L arm) Other Treatments: Treatments Neuromuscular Facilitation: Left;Upper Extremity;Lower Extremity;Activity to increase motor control;Forced use;Activity to increase coordination;Activity to increase timing and sequencing  See FIM for current functional status  Therapy/Group: Individual Therapy  Michaelene Song 12/08/2011, 8:55 AM

## 2011-12-08 NOTE — Progress Notes (Signed)
Physical Therapy Session Note  Patient Details  Name: Anthony Hardin MRN: [id number] Date of Birth: 1950/11/07  Today's Date: 12/08/2011 Time: 1100-1200 Time Calculation (min): 60 min  Therapy Documentation Precautions:  Precautions Precautions: Fall Precaution Comments: L neglect Restrictions Weight Bearing Restrictions: No Pain: Pain Assessment Pain Assessment: No/denies pain Mobility:  Patient performed w/c <> Nustep and w/c <> mat transfers with squat pivot to L and R with min A and verbal cues for full w/c parts management and set up and for safe hand placement and sequencing.  Performed sit <> supine on mat with supervision Locomotion : Wheelchair Mobility Distance: 150 supervision and able to navigate in crowded gym and hall ways during conversation with supervision Exercises:  Performed Nustep with bilat UE and LE for general strength, endurance, coordination and activation of L sided extensors x 10 minutes at level 5 resistance and focus on attending to LLE to maintain neutral hip rotation.   Other Treatments: Treatments Neuromuscular Facilitation: Left;Lower Extremity;Activity to increase motor control;Forced use;Activity to increase coordination;Activity to increase timing and sequencing and sustained activation supine on mat during 10 reps each bilat LE bridges with feet on mat with bilat LE squeezing football for increased L hip control/stability and for hamstring activation, attempted bridges with bilat LE in ankle DF but unable to maintain L ankle DF without visual feedback, performed 10 reps bilat LE bridges with 10 sec isometric hold with bilat feet on bosu for increased core and hip stabilization activation with min A overall.   Had discussion with patient about home set up and ability to reside on main level, stairs for home entry/exit and possibility of home evaluation next week to assess safety and home modifications that need to be completed prior to D/C home.   Also demonstrated to patient proper way to position heel wedge and Allard in shoe to prevent skin breakdown.   See FIM for current functional status  Therapy/Group: Individual Therapy  [name] [facility] 12/08/2011, 12:04 PM

## 2011-12-08 NOTE — Progress Notes (Signed)
Occupational Therapy Session Note  Patient Details  Name: Anthony Hardin MRN: [id number] Date of Birth: 24-Apr-1950  Today's Date: 12/08/2011 Time: 0915-1000 Time Calculation (min): 45 min  Short Term Goals: Week 4:  OT Short Term Goal 1 (Week 4): Pt will complete UB dressing with min assist with hemi-technique  OT Short Term Goal 2 (Week 4): Pt will complete bathing at sit to stand level in shower with supervision OT Short Term Goal 3 (Week 4): Pt will complete LB dressing at sit to stand level with supervision OT Short Term Goal 4 (Week 4): Pt will complete grooming with min assist in standing  Skilled Therapeutic Interventions/Progress Updates:    Pt seen for ADL retraining with focus on initiation and completion of self-care tasks of bathing and dressing.  Pt demonstrating increased ability to initiate and sequence steps of bathing and dressing with <25% cues.  Increased challenge by having pt engage in conversation (increasing distraction) while completing dressing tasks, which proved to be increasingly challenging as he would be unable to concentrate attention to LUE and LLE while donning clothes which increased time spent on each activity.  Pt required increased cues to use LUE to stabilize Lt leg while donning pants and changing hand placement to improve success with tasks.    Therapy Documentation Precautions:  Precautions Precautions: Fall Precaution Comments: L neglect Restrictions Weight Bearing Restrictions: No Pain: Pain Assessment Pain Assessment:  (no pain but c/o heaviness in L arm)  See FIM for current functional status  Therapy/Group: Individual Therapy  Leonette Monarch 12/08/2011, 11:36 AM

## 2011-12-08 NOTE — Progress Notes (Signed)
Speech Language Pathology Daily Session Note  Patient Details  Name: Anthony Hardin MRN: [id number] Date of Birth: 1950-06-27  Today's Date: 12/08/2011 Time: 6295-2841 Time Calculation (min): 30 min  Short Term Goals: Week 4: SLP Short Term Goal 1 (Week 4): Patient will alternate attention during a basic familiar task for 30 minutes with modified independence.  SLP Short Term Goal 2 (Week 4): Patient will demonstrate emergent awareness by asking for help when needed with supervision cues.  SLP Short Term Goal 3 (Week 4): Patient will solve complex functional problems with supervision cues.  Skilled Therapeutic Interventions: Treatment session targeted cognition goals.  SLP facilitated session with direct instructions to complete an alternating attention task without talking; however, to ask for help as needed.  Patient completed task for 30 minutes with environmental distractions present with 1 request for clarification and then verbal cue x1 after to limit verbal problem solving and focus on the functional problem solving.  Patient reported fatigue at end of session that he suspected was due to the change in his medications and requested to end session early and go to bed.  Patient was able to verbally instruct SLP on how to assist him back to be with supervision level question cues.     FIM:  Comprehension Comprehension Mode: Auditory Comprehension: 5-Understands complex 90% of the time/Cues < 10% of the time Expression Expression Mode: Verbal Expression: 6-Expresses complex ideas: With extra time/assistive device Social Interaction Social Interaction: 6-Interacts appropriately with others with medication or extra time (anti-anxiety, antidepressant). Problem Solving Problem Solving: 5-Solves complex 90% of the time/cues < 10% of the time Memory Memory: 5-Requires cues to use assistive device FIM - Eating Eating Activity: 5: Set-up assist for open containers;5: Set-up assist for cut  food  Pain Pain Assessment Pain Assessment: No/denies pain  Therapy/Group: Individual Therapy  Charlane Ferretti., CCC-SLP [phone]  Anthony Hardin 12/08/2011, 5:34 PM

## 2011-12-08 NOTE — Progress Notes (Signed)
Patient ID: Anthony Hardin, male   DOB: April 02, 1950, 61 y.o.   MRN: [id number] Admitted 10/31/2011 with diffuse weakness and unable to move and fell out of bed onto the floor. MRI of the brain showed a 4 x 1 x 2.5 cm acute hematoma right thalamus with mass effect and displacement distortion of the third ventricle but no suspicion of obstructive hydrocephalus. MRA of the head with no stenosis or occlusion.   Subjective/Complaints: No tingling on L side last noc some this am Review of Systems  Musculoskeletal: Negative for myalgias.  Neurological: Positive for sensory change and focal weakness.  All other systems reviewed and are negative.   Objective: Vital Signs: Blood pressure 146/90, pulse 64, temperature 98.2 F (36.8 C), temperature source Oral, resp. rate 17, height 5\' 9"  (1.753 m), weight 69.8 kg (153 lb 14.1 oz), SpO2 99.00%. No results found. No results found for this or any previous visit (from the past 72 hour(s)).   HEENT: normal Cardio: RRR Resp: CTA B/L GI: BS positive Extremity:  No Edema Skin:   Intact Neuro: Confused, Abnormal Sensory absent sensation L side lt touch and proprio, Abnormal Motor 2/5 finger flex and arm ext, 2/5 hip/knee extensory synergy,0/5 L Ankle Abnormal FMC Reflexes 3+, Reflexes: 3+, Tone:  Hypertonia, Inattention and Other L homonymous hemianopsia Musc/Skel:  Normal  Sensation absent L arm and leg Assessment/Plan: 1. Functional deficits secondary to R thalamic bleed which require 3+ hours per day of interdisciplinary therapy in a comprehensive inpatient rehab setting. Physiatrist is providing close team supervision and 24 hour management of active medical problems listed below. Physiatrist and rehab team continue to assess barriers to discharge/monitor patient progress toward functional and medical goals. FIM: FIM - Bathing Bathing Steps Patient Completed: Chest;Right Arm;Left Arm;Abdomen;Front perineal area;Buttocks;Right upper leg;Left upper  leg;Right lower leg (including foot);Left lower leg (including foot) Bathing: 4: Steadying assist  FIM - Upper Body Dressing/Undressing Upper body dressing/undressing steps patient completed: Thread/unthread right sleeve of pullover shirt/dresss;Thread/unthread left sleeve of pullover shirt/dress;Put head through opening of pull over shirt/dress Upper body dressing/undressing: 4: Min-Patient completed 75 plus % of tasks FIM - Lower Body Dressing/Undressing Lower body dressing/undressing steps patient completed: Thread/unthread right underwear leg;Thread/unthread left underwear leg;Pull underwear up/down;Thread/unthread right pants leg;Thread/unthread left pants leg;Pull pants up/down;Don/Doff right shoe;Don/Doff left shoe Lower body dressing/undressing: 4: Steadying Assist  FIM - Toileting Toileting steps completed by patient: Adjust clothing prior to toileting;Performs perineal hygiene Toileting Assistive Devices: Grab bar or rail for support Toileting: 3: Mod-Patient completed 2 of 3 steps  FIM - Diplomatic Services operational officer Devices: Grab bars Toilet Transfers: 4-To toilet/BSC: Min A (steadying Pt. > 75%);4-From toilet/BSC: Min A (steadying Pt. > 75%)  FIM - Banker Devices: Walker;Arm rests Bed/Chair Transfer: 4: Supine > Sit: Min A (steadying Pt. > 75%/lift 1 leg);4: Sit > Supine: Min A (steadying pt. > 75%/lift 1 leg)  FIM - Locomotion: Wheelchair Distance: 150 Locomotion: Wheelchair: 5: Travels 150 ft or more: maneuvers on rugs and over door sills with supervision, cueing or coaxing FIM - Locomotion: Ambulation Locomotion: Ambulation Assistive Devices: Walker - Rolling;Orthosis Ambulation/Gait Assistance: 3: Mod assist;2: Max assist Locomotion: Ambulation: 2: Travels 50 - 149 ft with moderate assistance (Pt: 50 - 74%)  Comprehension Comprehension Mode: Auditory Comprehension: 5-Understands complex 90% of the time/Cues < 10%  of the time  Expression Expression Mode: Verbal Expression: 5-Expresses complex 90% of the time/cues < 10% of the time  Social Interaction Social Interaction: 5-Interacts appropriately  90% of the time - Needs monitoring or encouragement for participation or interaction.  Problem Solving Problem Solving: 5-Solves complex 90% of the time/cues < 10% of the time  Memory Memory: 5-Recognizes or recalls 90% of the time/requires cueing < 10% of the time  2. Anticoagulation/DVT prophylaxis with non-Pharmaceutical:  Medical Problem List and Plan:  1. 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 DVT Prophylaxis/Anticoagulation: Subcutaneous Lovenox initiated 10/31/2011. Monitor platelet counts and any signs of bleeding  3. Mood: Continue Ritalin, now is on bid. Sleep-wake cycles have normalized may d/csleep graph D/C ritalin and monitor 4. Neuropsych: This patient is  capable of making decisions on his/her own behalf at this time.He has  poor awareness of deficits and grossly overestimates his physical and cognitive abilities  5. Hypertension. Norvasc 10 mg daily, clonidine patch 0.1 mg weekly, hydrochlorothiazide 25 mg daily, lisinopril 20 mg a.m. and 10 mg at bedtime. Monitor with increased mobility  6. Dysphagia. Mechanical soft diet thin liquids. Speech therapy followup. Monitor for any signs of aspiration.  7.  Sensory dysesthesia on L side. ? Recovery of sensation vs onset of thalamic pain syndrome, trial gabapentin at night, may need daytime dose if progressive  LOS (Days) 24 A FACE TO FACE EVALUATION WAS PERFORMED  Anthony Hardin E 12/08/2011, 7:15 AM

## 2011-12-09 ENCOUNTER — Inpatient Hospital Stay (HOSPITAL_COMMUNITY): Payer: BC Managed Care – PPO | Admitting: Occupational Therapy

## 2011-12-09 NOTE — Progress Notes (Signed)
Occupational Therapy Session Note  Patient Details  Name: Anthony Hardin MRN: [id number] Date of Birth: 1950/03/15  Today's Date: 12/09/2011 Time: 1300-1400 Time Calculation (min): 60 min  Skilled Therapeutic Interventions/Progress Updates:    Worked on LUE functional use and attention to the left side in group format.  Pt able to pick up and place cards using his left hand with increased time.  Noted pt with decreased timing and sequencing with reach.  Decreased ability to achieve full elbow extension before bringing trunk forward to reach.  Needs viusal attention to use UE secondary to decreased proprioception.  Therapy Documentation Precautions:  Precautions Precautions: Fall Precaution Comments: L neglect Restrictions Weight Bearing Restrictions: No  Pain: Pain Assessment Pain Assessment: No/denies pain ADL: See FIM for current functional status  Therapy/Group: Individual Therapy and Group Therapy  Dannel Rafter OTR/L 12/09/2011, 2:05 PM

## 2011-12-09 NOTE — Progress Notes (Signed)
Patient ID: Anthony Hardin, male   DOB: [birth year], 61 y.o.   MRN: [id number] Admitted 10/31/2011 with diffuse weakness and unable to move and fell out of bed onto the floor. MRI of the brain showed a 4 x 1 x 2.5 cm acute hematoma right thalamus with mass effect and displacement distortion of the third ventricle but no suspicion of obstructive hydrocephalus. MRA of the head with no stenosis or occlusion.   Subjective/Complaints: feels well, no complaints  Objective: Vital Signs: Blood pressure 132/76, pulse 56, temperature 97.9 F (36.6 C), temperature source Oral, resp. rate 16, height 5\' 9"  (1.753 m), weight 153 lb 14.1 oz (69.8 kg), SpO2 97.00%.  Exam:  well-developed well-nourished male in no acute distress. HEENT exam atraumatic, normocephalic, neck supple without jugular venous distention. Chest clear to auscultation cardiac exam S1-S2 are regular. Abdominal exam overweight with bowel sounds, soft and nontender. Extremities no edema. Neurologic exam is alertsitting in wheelchair  Assessment/Plan: 1. Functional deficits secondary to R thalamic bleed which require 3+ hours per day of interdisciplinary therapy in a comprehensive inpatient rehab setting. Physiatrist is providing close team supervision and 24 hour management of active medical problems listed below.  Medical Problem List and Plan:  1. 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 DVT Prophylaxis/Anticoagulation: Subcutaneous Lovenox initiated 10/31/2011. Monitor platelet counts and any signs of bleeding  3. Mood: Continue Ritalin, now is on bid. Sleep-wake cycles have normalized may d/csleep graph D/C ritalin and monitor 4. Neuropsych: This patient is  capable of making decisions on his/her own behalf at this time.He has  poor awareness of deficits and grossly overestimates his physical and cognitive abilities  5. Hypertension. Norvasc 10 mg daily, clonidine patch 0.1 mg  weekly, hydrochlorothiazide 25 mg daily, lisinopril 20 mg a.m. and 10 mg at bedtime. Monitor with increased mobility  6. Dysphagia. Mechanical soft diet thin liquids. Speech therapy followup. Monitor for any signs of aspiration.  7.  Sensory dysesthesia on L side. ? Recovery of sensation vs onset of thalamic pain syndrome, trial gabapentin at night, may need daytime dose if progressive  LOS (Days) 25 A FACE TO FACE EVALUATION WAS PERFORMED  Anthony Hardin 12/09/2011, 10:36 AM

## 2011-12-10 ENCOUNTER — Inpatient Hospital Stay (HOSPITAL_COMMUNITY): Payer: BC Managed Care – PPO

## 2011-12-10 NOTE — Progress Notes (Signed)
Patient ID: Anthony Hardin, male   DOB: 05/31/1950, 61 y.o.   MRN: [id number] Admitted 10/31/2011 with diffuse weakness and unable to move and fell out of bed onto the floor. MRI of the brain showed a 4 x 1 x 2.5 cm acute hematoma right thalamus with mass effect and displacement distortion of the third ventricle but no suspicion of obstructive hydrocephalus. MRA of the head with no stenosis or occlusion.   Subjective/Complaints: feels well, no complaints. He is very pleasant, appropriate. Mentally very alert  Objective: Vital Signs: Blood pressure 108/70, pulse 60, temperature 98.4 F (36.9 C), temperature source Oral, resp. rate 17, height 5\' 9"  (1.753 m), weight 153 lb 14.1 oz (69.8 kg), SpO2 96.00%.  Exam:  well-developed well-nourished male in no acute distress. HEENT exam atraumatic, normocephalic, neck supple without jugular venous distention. Chest clear to auscultation cardiac exam S1-S2 are regular. Abdominal exam overweight with bowel sounds, soft and nontender. Extremities no edema. Neurologic exam is alertsitting in wheelchair  Assessment/Plan: 1. Functional deficits secondary to R thalamic bleed which require 3+ hours per day of interdisciplinary therapy in a comprehensive inpatient rehab setting. Physiatrist is providing close team supervision and 24 hour management of active medical problems listed below.  Medical Problem List and Plan:  1. 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 DVT Prophylaxis/Anticoagulation: Subcutaneous Lovenox initiated 10/31/2011. Monitor platelet counts and any signs of bleeding  3. Mood: Continue - off ritalin 4. Neuropsych: This patient is  capable of making decisions on his/her own behalf at this time.He has  poor awareness of deficits and grossly overestimates his physical and cognitive abilities  5. Hypertension. Norvasc 10 mg daily, clonidine patch 0.1 mg weekly, hydrochlorothiazide 25 mg  daily, lisinopril 20 mg a.m. and 10 mg at bedtime. Monitor with increased mobility  bp well controlled 6. Dysphagia. Mechanical soft diet thin liquids. Speech therapy followup. Monitor for any signs of aspiration.  7.  Sensory dysesthesia on L side. ? Recovery of sensation vs onset of thalamic pain syndrome, trial gabapentin at night, may need daytime dose if progressive  LOS (Days) 26 A FACE TO FACE EVALUATION WAS PERFORMED  Makira Holleman HENRY 12/10/2011, 10:18 AM

## 2011-12-10 NOTE — Progress Notes (Signed)
Occupational Therapy Session Note  Patient Details  Name: Anthony Hardin MRN: [id number] Date of Birth: 04-Mar-1951  Today's Date: 12/10/2011 Time: 1478-2956 Time Calculation (min): 45 min  Short Term Goals: Week 4:  OT Short Term Goal 1 (Week 4): Pt will complete UB dressing with min assist with hemi-technique  OT Short Term Goal 2 (Week 4): Pt will complete bathing at sit to stand level in shower with supervision OT Short Term Goal 3 (Week 4): Pt will complete LB dressing at sit to stand level with supervision OT Short Term Goal 4 (Week 4): Pt will complete grooming with min assist in standing  Skilled Therapeutic Interventions/Progress Updates: ADL-retraining at walk-in shower with emphasis on attention to left, dynamic sitting and standing balance, functional use of L-UE during ADL, hemi-technique for UE and lower body dressing, and safety awareness.   Patient able to complete hemi-technique for lower bodying dressing, to include sit-stand to hike up pants with only contact guard for safety.   Patient verbalized satisfaction and enthusiasm for his progress in rehab due to recent evidence of improved sensation at L-LE.   Patient requires reduced distractions during ADL due to impaired alternating attention but demo's efficient and effective bathing skills for upper body bathing.   Dynamic sitting balance is challenged by lower body seated bathing at times and patient remains at risk for falls due to continued instability and possible impulsivity.     Therapy Documentation Precautions:  Precautions Precautions: Fall Precaution Comments: L neglect Restrictions Weight Bearing Restrictions: No  Vital Signs: Therapy Vitals Temp: 98.3 F (36.8 C) Temp src: Oral Pulse Rate: 78  Resp: 18  BP: 118/77 mmHg Patient Position, if appropriate: Sitting Oxygen Therapy SpO2: 98 % O2 Device: None (Room air)  See FIM for current functional status  Therapy/Group: Individual  Therapy  Georgeanne Nim 12/10/2011, 4:33 PM

## 2011-12-11 ENCOUNTER — Inpatient Hospital Stay (HOSPITAL_COMMUNITY): Payer: BC Managed Care – PPO | Admitting: Speech Pathology

## 2011-12-11 ENCOUNTER — Inpatient Hospital Stay (HOSPITAL_COMMUNITY): Payer: BC Managed Care – PPO | Admitting: Occupational Therapy

## 2011-12-11 ENCOUNTER — Inpatient Hospital Stay (HOSPITAL_COMMUNITY): Payer: BC Managed Care – PPO | Admitting: Physical Therapy

## 2011-12-11 NOTE — Progress Notes (Signed)
Speech Language Pathology Daily Session Note  Patient Details  Name: Anthony Hardin MRN: [id number] Date of Birth: [birth year]  Today's Date: 12/11/2011 Time: 1020-1105 Time Calculation (min): 45 min  Short Term Goals: Week 4: SLP Short Term Goal 1 (Week 4): Patient will alternate attention during a basic familiar task for 30 minutes with modified independence.  SLP Short Term Goal 2 (Week 4): Patient will demonstrate emergent awareness by asking for help when needed with supervision cues.  SLP Short Term Goal 3 (Week 4): Patient will solve complex functional problems with supervision cues.  Skilled Therapeutic Interventions: Treatment session targeted cognition goals. SLP facilitated session with direct instructions to complete alternating attention tasks with minimal talking; however, to ask for help as needed. Patient completed tasks for 30 minutes with environmental distractions present with supervision cues to check work for accuracy.  SLP educated paitent during discussion about the different levels of attention to explain rationale behind tasks.     FIM:  Comprehension Comprehension Mode: Auditory Comprehension: 6-Follows complex conversation/direction: With extra time/assistive device Expression Expression Mode: Verbal Expression: 6-Expresses complex ideas: With extra time/assistive device Social Interaction Social Interaction: 6-Interacts appropriately with others with medication or extra time (anti-anxiety, antidepressant). Problem Solving Problem Solving: 5-Solves complex 90% of the time/cues < 10% of the time Memory Memory: 6-More than reasonable amt of time  Pain Pain Assessment Pain Assessment: No/denies pain  Therapy/Group: Individual Therapy  [name], Conrad Herminie  Graduate Clinician Speech Language Pathology  Page, [name] 12/11/2011, 11:35 AM  The above skilled treatment note has been reviewed and SLP is in agreement. [name], M.A.,  CCC-SLP [phone]

## 2011-12-11 NOTE — Progress Notes (Signed)
Patient ID: Anthony Hardin, male   DOB: 1950/08/25, 61 y.o.   MRN: [id number] Admitted 10/31/2011 with diffuse weakness and unable to move and fell out of bed onto the floor. MRI of the brain showed a 4 x 1 x 2.5 cm acute hematoma right thalamus with mass effect and displacement distortion of the third ventricle but no suspicion of obstructive hydrocephalus. MRA of the head with no stenosis or occlusion.   Subjective/Complaints: Occ burning discomfort in L arm and Leg Review of Systems  Musculoskeletal: Negative for myalgias.  Neurological: Positive for sensory change and focal weakness.  All other systems reviewed and are negative.   Objective: Vital Signs: Blood pressure 129/77, pulse 63, temperature 97.9 F (36.6 C), temperature source Oral, resp. rate 20, height 5\' 9"  (1.753 m), weight 69.8 kg (153 lb 14.1 oz), SpO2 100.00%. No results found. No results found for this or any previous visit (from the past 72 hour(s)).   HEENT: normal Cardio: RRR Resp: CTA B/L GI: BS positive Extremity:  No Edema Skin:   Intact Neuro: Confused, Abnormal Sensory absent sensation L side lt touch and proprio, Abnormal Motor 2/5 finger flex and arm ext, 2/5 hip/knee extensory synergy,0/5 L Ankle Abnormal FMC Reflexes 3+, Reflexes: 3+, Tone:  Hypertonia, Inattention and Other L homonymous hemianopsia Musc/Skel:  Normal  Sensation absent L arm and leg Assessment/Plan: 1. Functional deficits secondary to R thalamic bleed which require 3+ hours per day of interdisciplinary therapy in a comprehensive inpatient rehab setting. Physiatrist is providing close team supervision and 24 hour management of active medical problems listed below. Physiatrist and rehab team continue to assess barriers to discharge/monitor patient progress toward functional and medical goals. FIM: FIM - Bathing Bathing Steps Patient Completed: Chest;Right Arm;Left Arm;Abdomen;Front perineal area;Buttocks;Right upper leg;Left upper  leg Bathing: 4: Min-Patient completes 8-9 19f 10 parts or 75+ percent  FIM - Upper Body Dressing/Undressing Upper body dressing/undressing steps patient completed: Thread/unthread right sleeve of pullover shirt/dresss;Thread/unthread left sleeve of pullover shirt/dress;Put head through opening of pull over shirt/dress;Pull shirt over trunk Upper body dressing/undressing: 6: More than reasonable amount of time FIM - Lower Body Dressing/Undressing Lower body dressing/undressing steps patient completed: Thread/unthread right underwear leg;Thread/unthread left underwear leg;Thread/unthread right pants leg;Thread/unthread left pants leg;Pull pants up/down Lower body dressing/undressing: 4: Min-Patient completed 75 plus % of tasks  FIM - Toileting Toileting steps completed by patient: Adjust clothing prior to toileting;Performs perineal hygiene Toileting Assistive Devices: Grab bar or rail for support Toileting: 3: Mod-Patient completed 2 of 3 steps  FIM - Diplomatic Services operational officer Devices: Grab bars Toilet Transfers: 4-To toilet/BSC: Min A (steadying Pt. > 75%);4-From toilet/BSC: Min A (steadying Pt. > 75%)  FIM - Banker Devices: Walker;Arm rests Bed/Chair Transfer: 4: Sit > Supine: Min A (steadying pt. > 75%/lift 1 leg);4: Bed > Chair or W/C: Min A (steadying Pt. > 75%);4: Chair or W/C > Bed: Min A (steadying Pt. > 75%)  FIM - Locomotion: Wheelchair Distance: 150 Locomotion: Wheelchair: 5: Travels 150 ft or more: maneuvers on rugs and over door sills with supervision, cueing or coaxing FIM - Locomotion: Ambulation Locomotion: Ambulation Assistive Devices: Walker - Rolling;Orthosis Ambulation/Gait Assistance: 3: Mod assist;2: Max assist Locomotion: Ambulation: 0: Activity did not occur  Comprehension Comprehension Mode: Auditory Comprehension: 6-Follows complex conversation/direction: With extra time/assistive  device  Expression Expression Mode: Verbal Expression: 6-Expresses complex ideas: With extra time/assistive device  Social Interaction Social Interaction: 6-Interacts appropriately with others with medication or extra time (  anti-anxiety, antidepressant).  Problem Solving Problem Solving: 5-Solves basic problems: With no assist  Memory Memory: 6-More than reasonable amt of time  2. Anticoagulation/DVT prophylaxis with non-Pharmaceutical:  Medical Problem List and Plan:  1. 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 DVT Prophylaxis/Anticoagulation: Subcutaneous Lovenox initiated 10/31/2011. Monitor platelet counts and any signs of bleeding  3. Mood: Continue Ritalin, now is on bid. Sleep-wake cycles have normalized may d/csleep graph D/C ritalin and monitor 4. Neuropsych: This patient is  capable of making decisions on his/her own behalf at this time.He has  poor awareness of deficits and grossly overestimates his physical and cognitive abilities  5. Hypertension. Norvasc 10 mg daily, clonidine patch 0.1 mg weekly, hydrochlorothiazide 25 mg daily, lisinopril 20 mg a.m. and 10 mg at bedtime. Monitor with increased mobility  6. Dysphagia. Mechanical soft diet thin liquids. Speech therapy followup. Monitor for any signs of aspiration.  7.  Sensory dysesthesia on L side. ? Recovery of sensation vs onset of thalamic pain syndrome, trial gabapentin at night, may need daytime dose if progressive  LOS (Days) 27 A FACE TO FACE EVALUATION WAS PERFORMED  Fate Caster E 12/11/2011, 7:21 AM

## 2011-12-11 NOTE — Progress Notes (Signed)
Occupational Therapy Session Note  Patient Details  Name: Anthony Hardin MRN: [id number] Date of Birth: 1950-11-04  Today's Date: 12/11/2011 Time: [phone] and 3086-5784 Time Calculation (min): 57 min  Short Term Goals: Week 4:  OT Short Term Goal 1 (Week 4): Pt will complete UB dressing with min assist with hemi-technique  OT Short Term Goal 2 (Week 4): Pt will complete bathing at sit to stand level in shower with supervision OT Short Term Goal 3 (Week 4): Pt will complete LB dressing at sit to stand level with supervision OT Short Term Goal 4 (Week 4): Pt will complete grooming with min assist in standing  Skilled Therapeutic Interventions/Progress Updates:    1) Pt seen for ADL retraining with focus on increased independence and carryover of techniques with bathing and dressing.  Pt propelled w/c into bathroom with min cues for attention to Lt side and cues for setup prior to transfer to tub bench.  Pt completed side step into walk-in shower with use of grab bar and stand by assist.  Pt demonstrating increased safety wit sit <> stand and dynamic standing with bathing and dressing.  Pt required mod cues for attention to Lt side to obtain items. Discussed downstairs bathroom layout and discussed adding grab bars next to toilet and in walk-in shower as well as getting a tub bench for the shower.  Pt showing tremendous improvements and awareness of safety, plan to increase challenge to mobility with RW.  2) 1:1 OT with focus on functional ambulation with RW to complete toilet transfer and hand hygiene.  Pt ambulated from bed to toilet with min-mod assist with cues for LLE advancement and proper step length to increase safety.  Pt completed toilet transfer with min/steady assist and completed clothing management and hygiene with supervision. Engaged in NM re-ed with focus on [facility], in hand manipulation, and tip to tip/3 jaw chuck pinches.  Pt completed peg manipulation with increased time, approx 7  mins to put 6 pegs in hole and take out (~5 mins to put in, and 1.5 to take out).  Pt demonstrated ability to rotate 1" peg in between thumb and first finger while visually attending without dropping peg.    Therapy Documentation Precautions:  Precautions Precautions: Fall Precaution Comments: L neglect Restrictions Weight Bearing Restrictions: No Pain:  Pt with no c/o pain this session.  See FIM for current functional status  Therapy/Group: Individual Therapy  Leonette Monarch 12/11/2011, 9:58 AM

## 2011-12-11 NOTE — Progress Notes (Signed)
Physical Therapy Session Note  Patient Details  Name: Anthony Hardin MRN: [id number] Date of Birth: 10/16/1950  Today's Date: 12/11/2011 Time: 4098-1191 Time Calculation (min): 60 min  Short Term Goals: Week 3:  PT Short Term Goal 1 (Week 3): = LTG  Therapy Documentation Precautions:  Precautions Precautions: Fall Precaution Comments: L neglect Restrictions Weight Bearing Restrictions: No Pain: Pain Assessment Pain Assessment: No/denies pain Other Treatments: Treatments Neuromuscular Facilitation: Left;Lower Extremity;Forced use;Activity to increase coordination;Activity to increase motor control;Activity to increase timing and sequencing;Activity to increase sustained activation;Activity to increase lateral weight shifting;Activity to increase anterior-posterior weight shifting in standing during L lateral side stepping and L lateral weight shifting during squat <> stand and lateral stepping with sustained squat for increased hip and knee control during lateral excursion; isolated activation of LLE hamstring and quad muscles during bilat LE foot propulsion in w/c with verbal and tactile cues needed to minimize use of trunk extension during knee extension; continued trunk control training with focus on anterior pelvic tilts and lateral trunk excursion to bring COG over BOS and forward progression of COG during sit <> stand x 4 reps with min-mod A.  See FIM for current functional status  Therapy/Group: Individual Therapy  [name] [facility] 12/11/2011, 5:21 PM

## 2011-12-11 NOTE — Progress Notes (Signed)
Social Work Patient ID: Anthony Hardin, male   DOB: 11/19/1950, 61 y.o.   MRN: [id number] [employer] clinical update faxed to [facility] for [facility].

## 2011-12-12 ENCOUNTER — Inpatient Hospital Stay (HOSPITAL_COMMUNITY): Payer: BC Managed Care – PPO | Admitting: Occupational Therapy

## 2011-12-12 ENCOUNTER — Inpatient Hospital Stay (HOSPITAL_COMMUNITY): Payer: BC Managed Care – PPO | Admitting: Physical Therapy

## 2011-12-12 ENCOUNTER — Inpatient Hospital Stay (HOSPITAL_COMMUNITY): Payer: BC Managed Care – PPO | Admitting: Speech Pathology

## 2011-12-12 DIAGNOSIS — I619 Nontraumatic intracerebral hemorrhage, unspecified: Secondary | ICD-10-CM

## 2011-12-12 DIAGNOSIS — G8110 Spastic hemiplegia affecting unspecified side: Secondary | ICD-10-CM

## 2011-12-12 DIAGNOSIS — Z5189 Encounter for other specified aftercare: Secondary | ICD-10-CM

## 2011-12-12 NOTE — Progress Notes (Signed)
Speech Language Pathology Daily Session Note  Patient Details  Name: Anthony Hardin MRN: [id number] Date of Birth: Aug 04, 1950  Today's Date: 12/12/2011 Time: 0950-1030 Time Calculation (min): 40 min  Short Term Goals: Week 4: SLP Short Term Goal 1 (Week 4): Patient will alternate attention during a basic familiar task for 30 minutes with modified independence.  SLP Short Term Goal 2 (Week 4): Patient will demonstrate emergent awareness by asking for help when needed with supervision cues.  SLP Short Term Goal 3 (Week 4): Patient will solve complex functional problems with supervision cues.  Skilled Therapeutic Interventions: Treatment session focused on addressing cognitive goals during a complex scavenger hunt task. SLP facilitated session by providing patient a list of locations to find within the rehab unit and provided supervision level verbal cues to attend to left upper extremity during wheel chair mobility and to utilize external and written aids.  SLP also facilitated session with supervision level verbal cues to attend to left upper extremity and to problem solve during a basic self care task, and min assist to transfer  to toilet. Patient overall supervision with both basic and complex tasks due to left inattention at times.    FIM:  Comprehension Comprehension Mode: Auditory Comprehension: 5-Understands complex 90% of the time/Cues < 10% of the time Expression Expression Mode: Verbal Social Interaction Social Interaction: 5-Interacts appropriately 90% of the time - Needs monitoring or encouragement for participation or interaction. Problem Solving Problem Solving: 5-Solves complex 90% of the time/cues < 10% of the time Memory Memory: 5-Recognizes or recalls 90% of the time/requires cueing < 10% of the time FIM - Eating Eating Activity: 5: Set-up assist for open containers;5: Set-up assist for cut food  Pain Pain Assessment Pain Assessment: No/denies  pain  Therapy/Group: Individual Therapy  Anthony Ferretti., Anthony Hardin [phone]  Anthony Hardin 12/12/2011, 2:45 PM

## 2011-12-12 NOTE — Progress Notes (Signed)
Physical Therapy Weekly Progress Note  Patient Details  Name: Anthony Hardin MRN: [id number] Date of Birth: 12-28-1950  Today's Date: 12/12/2011 Time: 7829-5621 Time Calculation (min): 41 min  Patient has met 2 of 10 long term goals.  Patient's goals upgraded to supervision-min A overall and home ambulation and w/c mobility goals reactivated secondary to patient's significant progress and patient and family's decision to continue with inpatient rehabilitation to reach safe functional level to D/C home with 24/7 hired supervision and assistance.  Patient is currently supervision w/c mobility, bed mobility, min A bed <> w/c transfers and mod A overall for gait with RW household distances and stair negotiation.  Home evaluation has been tentatively planned for the end of the week to allow for assessment of patient's safety with in the home and assess for any home modifications.   Patient continues to demonstrate the following deficits: L hemiplegia with increased tone, decreased motor control, timing, sequencing, coordination, apraxia, ataxia, impaired cognition, impaired postural control, dynamic balance, gait and therefore will continue to benefit from skilled PT intervention to enhance overall performance with balance, postural control, ability to compensate for deficits, functional use of  left upper extremity and left lower extremity, attention, awareness and coordination.  Patient progressing toward long term goals..  Continue plan of care.  PT Short Term Goals Week 3:  PT Short Term Goal 1 (Week 3): = LTG  Skilled Therapeutic Interventions/Progress Updates:   PM session; patient performed w/c mobility on unit x 150' x 2 with supervision with intermittent cues for w/c parts management and set up for transfers.  Patient performed gait training with RW x 50' in controlled environment with blue rocker with heel wedge LLE and mod A for R lateral weight shifts to allow for full LLE clearance and  advancement, verbal cues for wider BOS and verbal and tactile cues for full anterior translation of COG over L stance LE with full hip and knee extension and anterior pelvic rotation in stance.  Removed RW and changed to patient performing gait with LUE in ER, ABD and around therapist's shoulders to facilitate more upright trunk and thoracic extension to allow increased anterior pelvic rotation to initiate swing phase and increased hip extension in stance; manual facilitation also provided to maintain momentum and forward progression of COG over each LE.    Stair training with 2 rails up and down 5 steps x 2 reps with mod A overall with focus on ascending with LLE with step over step technique and descending with LLE with step to technique for LE extensor activation and forward progression of COG.  Continues to require verbal cues for safety and sequencing with AFO.  Therapy Documentation Precautions:  Precautions Precautions: Fall Precaution Comments: L neglect Restrictions Weight Bearing Restrictions: No Pain: Pain Assessment Pain Assessment: No/denies pain Locomotion : Ambulation Ambulation/Gait Assistance: 3: Mod assist Wheelchair Mobility Distance: 150   See FIM for current functional status  Therapy/Group: Individual Therapy  [name] [facility] 12/12/2011, 2:43 PM

## 2011-12-12 NOTE — Progress Notes (Signed)
Nutrition Follow-up  Intervention:   1. Offer appropriate alternatives it pt refuses meals 2. RD to continue to follow nutrition care plan  Assessment:   Pt continues to eat very well, consuming 100% of meals. Noted pt with continued wt loss, suspect this is likely loss of lean body mass given pt's now relatively sedentary lifestyle (patient reports that he used to lift weights on a regular basis PTA.)  Diet Order:  Regular Supplement: Resource Breeze PO daily  Meds: Scheduled Meds:    . amLODipine  10 mg Oral Daily  . cloNIDine  0.1 mg Transdermal Weekly  . enoxaparin (LOVENOX) injection  40 mg Subcutaneous Q24H  . feeding supplement  1 Container Oral Q24H  . gabapentin  100 mg Oral QHS  . hydrochlorothiazide  25 mg Oral Daily  . lisinopril  10 mg Oral QHS  . lisinopril  20 mg Oral Daily  . multivitamin with minerals  1 tablet Oral Daily  . potassium chloride  10 mEq Oral Daily  . senna-docusate  1 tablet Oral BID   Continuous Infusions:  PRN Meds:.acetaminophen, bisacodyl, ondansetron (ZOFRAN) IV, ondansetron, polyethylene glycol, sorbitol  Labs:  CMP     Component Value Date/Time   NA 135 11/15/2011 0625   K 3.9 11/15/2011 0625   CL 96 11/15/2011 0625   CO2 28 11/15/2011 0625   GLUCOSE 103* 11/15/2011 0625   BUN 30* 11/15/2011 0625   CREATININE 1.19 11/15/2011 0625   CALCIUM 10.4 11/15/2011 0625   PROT 7.0 11/15/2011 0625   ALBUMIN 3.5 11/15/2011 0625   AST 18 11/15/2011 0625   ALT 38 11/15/2011 0625   ALKPHOS 58 11/15/2011 0625   BILITOT 0.5 11/15/2011 0625   GFRNONAA 65* 11/15/2011 0625   GFRAA 76* 11/15/2011 0625     Intake/Output Summary (Last 24 hours) at 12/12/11 1218 Last data filed at 12/11/11 2000  Gross per 24 hour  Intake    480 ml  Output      0 ml  Net    480 ml  BM 9/22  Weight Status:  153 lb - stable  Estimated needs:  1900 - 2000 kcal, 90 - 105 grams protein  Nutrition Dx:  Inadequate oral intake r/t decreased alertness AEB poor meal completion (<25%.)  Resolved.  Goal: Pt to meet >/= 90% of their estimated nutrition needs;  met  Monitor: weight trends, lab trends, I/O's, PO intake, supplement tolerance  Jarold Motto MS, RD, LDN Pager: [phone] After-hours pager: [phone]

## 2011-12-12 NOTE — Progress Notes (Signed)
Occupational Therapy Session Note  Patient Details  Name: Anthony Hardin MRN: [id number] Date of Birth: 1950-04-29  Today's Date: 12/12/2011 Time: 0815-0910 Time Calculation (min): 55 min  Short Term Goals: Week 4:  OT Short Term Goal 1 (Week 4): Pt will complete UB dressing with min assist with hemi-technique  OT Short Term Goal 2 (Week 4): Pt will complete bathing at sit to stand level in shower with supervision OT Short Term Goal 3 (Week 4): Pt will complete LB dressing at sit to stand level with supervision OT Short Term Goal 4 (Week 4): Pt will complete grooming with min assist in standing  Skilled Therapeutic Interventions/Progress Updates:    Pt seen for ADL retraining with focus on functional ambulation with RW, transfers, sit <> stand, sequencing and problem solving with self-care task of bathing and dressing.  Pt ambulated with RW from bathroom door to walk-in shower with mod assist and cues to advance LLE.  Pt required min cues for change in shower transfer secondary to use of RW, however able to demonstrate understanding with only verbal cues.  Pt completed bathing and dressing at stand by assist.  Pt required assist with donning Lt pant leg after putting it in the wrong leg hole.  Pt unable to problem solve how to correct leg placement after initial error.  Pt demonstrating increased awareness of Lt side of body with demonstration of shaving and attending to Lt side of face without any verbal cues.  Therapy Documentation Precautions:  Precautions Precautions: Fall Precaution Comments: L neglect Restrictions Weight Bearing Restrictions: No General:   Vital Signs: Therapy Vitals Temp: 97.5 F (36.4 C) Temp src: Oral Pulse Rate: 56  Resp: 20  BP: 132/79 mmHg Patient Position, if appropriate: Lying Oxygen Therapy SpO2: 96 % O2 Device: None (Room air) Pain: Pain Assessment Pain Assessment: No/denies pain Pain Score: 0-No pain   See FIM for current functional  status  Therapy/Group: Individual Therapy  Leonette Monarch 12/12/2011, 9:15 AM

## 2011-12-12 NOTE — Progress Notes (Signed)
I have read and agree with the following treatment session.  Audra Hall, PT, DPT 

## 2011-12-12 NOTE — Progress Notes (Signed)
Patient ID: Anthony Hardin, male   DOB: Jun 26, 1950, 61 y.o.   MRN: [id number] Admitted 10/31/2011 with diffuse weakness and unable to move and fell out of bed onto the floor. MRI of the brain showed a 4 x 1 x 2.5 cm acute hematoma right thalamus with mass effect and displacement distortion of the third ventricle but no suspicion of obstructive hydrocephalus. MRA of the head with no stenosis or occlusion.   Subjective/Complaints: Occ burning discomfort in L arm and Leg.  Patient does not feel that this is interfering with therapy thus far Review of Systems  Musculoskeletal: Negative for myalgias.  Neurological: Positive for sensory change and focal weakness.  All other systems reviewed and are negative.   Objective: Vital Signs: Blood pressure 132/79, pulse 56, temperature 97.5 F (36.4 C), temperature source Oral, resp. rate 20, height 5\' 9"  (1.753 m), weight 69.8 kg (153 lb 14.1 oz), SpO2 96.00%. No results found. No results found for this or any previous visit (from the past 72 hour(s)).   HEENT: normal Cardio: RRR Resp: CTA B/L GI: BS positive Extremity:  No Edema Skin:   Intact Neuro: Confused, Abnormal Sensory absent sensation L side lt touch and proprio, Abnormal Motor 2/5 finger flex and arm ext, 2/5 hip/knee extensory synergy,0/5 L Ankle Abnormal FMC Reflexes 3+, Reflexes: 3+, Tone:  Hypertonia, Inattention and Other L homonymous hemianopsia Musc/Skel:  Normal  Sensation absent L arm and leg Assessment/Plan: 1. Functional deficits secondary to R thalamic bleed which require 3+ hours per day of interdisciplinary therapy in a comprehensive inpatient rehab setting. Physiatrist is providing close team supervision and 24 hour management of active medical problems listed below. Physiatrist and rehab team continue to assess barriers to discharge/monitor patient progress toward functional and medical goals. FIM: FIM - Bathing Bathing Steps Patient Completed: Chest;Right Arm;Left  Arm;Abdomen;Front perineal area;Buttocks;Right upper leg;Left upper leg;Right lower leg (including foot);Left lower leg (including foot) Bathing: 4: Steadying assist  FIM - Upper Body Dressing/Undressing Upper body dressing/undressing steps patient completed: Thread/unthread right sleeve of pullover shirt/dresss;Thread/unthread left sleeve of pullover shirt/dress;Put head through opening of pull over shirt/dress;Pull shirt over trunk Upper body dressing/undressing: 6: More than reasonable amount of time FIM - Lower Body Dressing/Undressing Lower body dressing/undressing steps patient completed: Thread/unthread right underwear leg;Thread/unthread left underwear leg;Pull underwear up/down;Thread/unthread left pants leg;Thread/unthread right pants leg;Pull pants up/down;Don/Doff right shoe Lower body dressing/undressing: 4: Min-Patient completed 75 plus % of tasks  FIM - Toileting Toileting steps completed by patient: Adjust clothing prior to toileting;Performs perineal hygiene Toileting Assistive Devices: Grab bar or rail for support Toileting: 3: Mod-Patient completed 2 of 3 steps  FIM - Diplomatic Services operational officer Devices: Grab bars Toilet Transfers: 4-To toilet/BSC: Min A (steadying Pt. > 75%);4-From toilet/BSC: Min A (steadying Pt. > 75%)  FIM - Banker Devices: Walker;Arm rests Bed/Chair Transfer: 5: Supine > Sit: Supervision (verbal cues/safety issues);4: Bed > Chair or W/C: Min A (steadying Pt. > 75%)  FIM - Locomotion: Wheelchair Distance: 150 Locomotion: Wheelchair: 5: Travels 150 ft or more: maneuvers on rugs and over door sills with supervision, cueing or coaxing FIM - Locomotion: Ambulation Locomotion: Ambulation Assistive Devices: Walker - Rolling;Orthosis Ambulation/Gait Assistance: 3: Mod assist;2: Max assist Locomotion: Ambulation: 0: Activity did not occur  Comprehension Comprehension Mode: Auditory Comprehension:  5-Understands complex 90% of the time/Cues < 10% of the time  Expression Expression Mode: Verbal Expression: 5-Expresses complex 90% of the time/cues < 10% of the time  Restaurant manager, fast food  Social Interaction: 5-Interacts appropriately 90% of the time - Needs monitoring or encouragement for participation or interaction.  Problem Solving Problem Solving: 5-Solves basic 90% of the time/requires cueing < 10% of the time  Memory Memory: 5-Recognizes or recalls 90% of the time/requires cueing < 10% of the time  2. Anticoagulation/DVT prophylaxis with non-Pharmaceutical:  Medical Problem List and Plan:  1. 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 DVT Prophylaxis/Anticoagulation: Subcutaneous Lovenox initiated 10/31/2011. Monitor platelet counts and any signs of bleeding  3. Mood: Continue Ritalin, now is on bid. Sleep-wake cycles have normalized may d/csleep graph D/C ritalin and monitor 4. Neuropsych: This patient is  capable of making decisions on his/her own behalf at this time.He has  poor awareness of deficits and grossly overestimates his physical and cognitive abilities  5. Hypertension. Norvasc 10 mg daily, clonidine patch 0.1 mg weekly, hydrochlorothiazide 25 mg daily, lisinopril 20 mg a.m. and 10 mg at bedtime. Monitor with increased mobility  6. Dysphagia. Mechanical soft diet thin liquids. Speech therapy followup. Monitor for any signs of aspiration.  7.  Sensory dysesthesia on L side. ? Recovery of sensation vs onset of thalamic pain syndrome, trial gabapentin at night, may need daytime dose if progressive  LOS (Days) 28 A FACE TO FACE EVALUATION WAS PERFORMED  Tenna Lacko E 12/12/2011, 8:12 AM

## 2011-12-12 NOTE — Progress Notes (Signed)
Physical Therapy Session Note  Patient Details  Name: ADILSON GRAFTON MRN: [id number] Date of Birth: 07-Aug-1950  Today's Date: 12/12/2011 Time: 1030-1116 Time Calculation (min): 46 min  Short Term Goals: Week 3:  PT Short Term Goal 1 (Week 3): = LTG     Therapy Documentation Precautions:  Precautions Precautions: Fall Precaution Comments: L neglect Restrictions Weight Bearing Restrictions: No      Pain: Pain Assessment Pain Assessment: No/denies pain Pain Score: 0-No pain     Other Treatments:   Patient propelled w/c with R hemi technique from room > PT gym (~150 feet) with supervision. Patient performed w/c <> mat stand pivot transfer with use of RUE and min assist for steadying. Patient performed sit <> stands x 15 with LUE on therapists shoulder, R across chest with emphasis on pushing forward with LUE to encourage anterior weight shift, anterior pelvic tilt, and increased weight bearing through LLE. Patient able to perform with min/mod assist, verbal and tactile cues for form and posture/positioning. Patient performed reaching to the R with RUE in sitting for card and bringing card across midline to L hip, where patient was instructed to perform L hip hike and place card under L hip. Task was chosen to aid in elongating R trunk, while activating L trunk musculature, improve motor control of L hip, encourage trunk rotation, and improve postural control and flexibility. Patient tolerated activity well.   See FIM for current functional status  Therapy/Group: Individual Therapy  Maie Kesinger Hamilton DPT Student   12/12/2011, 12:09 PM

## 2011-12-13 ENCOUNTER — Inpatient Hospital Stay (HOSPITAL_COMMUNITY): Payer: BC Managed Care – PPO | Admitting: Speech Pathology

## 2011-12-13 ENCOUNTER — Inpatient Hospital Stay (HOSPITAL_COMMUNITY): Payer: BC Managed Care – PPO | Admitting: Occupational Therapy

## 2011-12-13 ENCOUNTER — Inpatient Hospital Stay (HOSPITAL_COMMUNITY): Payer: BC Managed Care – PPO | Admitting: Physical Therapy

## 2011-12-13 NOTE — Patient Care Conference (Signed)
Inpatient RehabilitationTeam Conference Note Date: 12/13/2011   Time: 10:50AM    Patient Name: Anthony Hardin      Medical Record Number: [id number]  Date of Birth: 06/29/1950 Sex: Male         Room/Bed: 4025/4025-01 Payor Info: Payor: BLUE CROSS BLUE SHIELD  Plan: [facility] HE[employer] PPO  Product Type: *No Product type*     Admitting Diagnosis: RT HEMM   Admit Date/Time:  11/14/2011  5:35 PM Admission Comments: No comment available   Primary Diagnosis:  Thalamic hemorrhage Principal Problem: Thalamic hemorrhage  Patient Active Problem List   Diagnosis Date Noted  . 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 Intracerebral hemorrhage 10/31/2011  . 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 Hemiplegia, unspecified, affecting nondominant side 10/31/2011    Expected Discharge Date: Expected Discharge Date: 12/26/11  Team Members Present: Physician: Dr. Claudette Laws Social Worker Present: Dossie Der, LCSW Nurse Present: Other (comment) Feliz Beam) PT Present: [name], PT;Other (comment) Rayfield Citizen Cook-PT) OT Present: Bretta Bang, Verlene Mayer, OT SLP Present: [name], SLP     Current Status/Progress Goal Weekly Team Focus  Medical   Improving safety awareness, severe sensory loss on left side  Simplify medication regimen  Now off of Ritalin   Bowel/Bladder   Continent of bowel and bladder. LBM 12/12/2011.  Remains continent of B/B  Monitor   Swallow/Nutrition/ Hydration      goals met      ADL's   steady assist (min) bathing and dressing, supervision assist grooming in sitting/min assist in standing, min-mod assist transfers with increased use of RW with tub and toilet transfers  supervision overall  continued integration of LUE in self-care tasks, Lt attention, transfers with RW as pertaining to self-care tasks   Mobility   supervision bed and w/c mobility, min A basic transfers, mod A gait with RW, stairs  supervision-min A overall  postural control,  balance, gait, home eval?   Communication             Safety/Cognition/ Behavioral Observations  supervision-min   Goals upgraded; Mod I-supervision  increase self monitoring and correcting with complex tasks; decrease frequency to 3 times a week   Pain   Mild Headache . Tylenol 650 mg given   pain scale < 3 or less  Monitor for pain and effectiveness.   Skin   n/a  n/a  n/a      *See Interdisciplinary Assessment and Plan and progress notes for long and short-term goals  Barriers to Discharge: No local 24-hour care    Possible Resolutions to Barriers:  Family from out of town may provide 24 hour care and will need training    Discharge Planning/Teaching Needs:  Pt and faily pleased with his progess and coming up with a plan after discharge from rehab      Team Discussion:  Pt making good progress.  Home eval-regular diet,meds whole.  Attention better and gait better.  Revisions to Treatment Plan:  Revised goals to higher level-no longer NHP   Continued Need for Acute Rehabilitation Level of Care: The patient requires daily medical management by a physician with specialized training in physical medicine and rehabilitation for the following conditions: Daily direction of a multidisciplinary physical rehabilitation program to ensure safe treatment while eliciting the highest outcome that is of practical value to the patient.: Yes Daily medical management of patient stability for increased activity during participation in an intensive rehabilitation regime.: Yes Daily analysis of laboratory values and/or radiology reports with any subsequent  need for medication adjustment of medical intervention for : Neurological problems  [name], [name] 12/13/2011, 3:06 PM

## 2011-12-13 NOTE — Progress Notes (Signed)
Patient ID: Anthony Hardin, male   DOB: 04-22-1950, 61 y.o.   MRN: [id number] Admitted 10/31/2011 with diffuse weakness and unable to move and fell out of bed onto the floor. MRI of the brain showed a 4 x 1 x 2.5 cm acute hematoma right thalamus with mass effect and displacement distortion of the third ventricle but no suspicion of obstructive hydrocephalus. MRA of the head with no stenosis or occlusion.   Subjective/Complaints: Occ burning discomfort in L arm and Leg.  Patient does not feel that this is interfering with therapy thus far OT notes improving safety awareness Review of Systems  Musculoskeletal: Negative for myalgias.  Neurological: Positive for sensory change and focal weakness.  All other systems reviewed and are negative.   Objective: Vital Signs: Blood pressure 125/78, pulse 55, temperature 97.6 F (36.4 C), temperature source Oral, resp. rate 19, height 5\' 9"  (1.753 m), weight 69.8 kg (153 lb 14.1 oz), SpO2 97.00%. No results found. No results found for this or any previous visit (from the past 72 hour(s)).   HEENT: normal Cardio: RRR Resp: CTA B/L GI: BS positive Extremity:  No Edema Skin:   Intact Neuro: Confused, Abnormal Sensory absent sensation L side lt touch and proprio, Abnormal Motor 2/5 finger flex and arm ext, 2/5 hip/knee extensory synergy,0/5 L Ankle Abnormal FMC Reflexes 3+, Reflexes: 3+, Tone:  Hypertonia, Inattention and Other L homonymous hemianopsia Musc/Skel:  Normal  Sensation absent L arm and leg Assessment/Plan: 1. Functional deficits secondary to R thalamic bleed which require 3+ hours per day of interdisciplinary therapy in a comprehensive inpatient rehab setting. Physiatrist is providing close team supervision and 24 hour management of active medical problems listed below. Physiatrist and rehab team continue to assess barriers to discharge/monitor patient progress toward functional and medical goals. FIM: FIM - Bathing Bathing Steps  Patient Completed: Chest;Right Arm;Left Arm;Abdomen;Front perineal area;Buttocks;Right upper leg;Left upper leg;Right lower leg (including foot);Left lower leg (including foot) Bathing: 4: Steadying assist  FIM - Upper Body Dressing/Undressing Upper body dressing/undressing steps patient completed: Thread/unthread right sleeve of pullover shirt/dresss;Thread/unthread left sleeve of pullover shirt/dress;Put head through opening of pull over shirt/dress;Pull shirt over trunk Upper body dressing/undressing: 6: More than reasonable amount of time FIM - Lower Body Dressing/Undressing Lower body dressing/undressing steps patient completed: Thread/unthread right underwear leg;Thread/unthread left underwear leg;Pull underwear up/down;Thread/unthread right pants leg;Pull pants up/down;Don/Doff right shoe;Don/Doff left shoe Lower body dressing/undressing: 4: Min-Patient completed 75 plus % of tasks  FIM - Toileting Toileting steps completed by patient: Adjust clothing prior to toileting;Performs perineal hygiene Toileting Assistive Devices: Grab bar or rail for support Toileting: 3: Mod-Patient completed 2 of 3 steps  FIM - Diplomatic Services operational officer Devices: Grab bars Toilet Transfers: 4-To toilet/BSC: Min A (steadying Pt. > 75%);4-From toilet/BSC: Min A (steadying Pt. > 75%)  FIM - Bed/Chair Transfer Bed/Chair Transfer Assistive Devices: Therapist, occupational: 4: Bed > Chair or W/C: Min A (steadying Pt. > 75%);4: Chair or W/C > Bed: Min A (steadying Pt. > 75%)  FIM - Locomotion: Wheelchair Distance: 150 Locomotion: Wheelchair: 5: Travels 150 ft or more: maneuvers on rugs and over door sills with supervision, cueing or coaxing FIM - Locomotion: Ambulation Locomotion: Ambulation Assistive Devices: Walker - Rolling;Orthosis Ambulation/Gait Assistance: 3: Mod assist Locomotion: Ambulation: 2: Travels 50 - 149 ft with moderate assistance (Pt: 50 -  74%)  Comprehension Comprehension Mode: Auditory Comprehension: 5-Understands complex 90% of the time/Cues < 10% of the time  Expression Expression Mode: Verbal Expression: 5-Expresses  complex 90% of the time/cues < 10% of the time  Social Interaction Social Interaction: 5-Interacts appropriately 90% of the time - Needs monitoring or encouragement for participation or interaction.  Problem Solving Problem Solving: 5-Solves complex 90% of the time/cues < 10% of the time  Memory Memory: 5-Recognizes or recalls 90% of the time/requires cueing < 10% of the time  2. Anticoagulation/DVT prophylaxis with non-Pharmaceutical:  Medical Problem List and Plan:  1. 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 DVT Prophylaxis/Anticoagulation: Subcutaneous Lovenox initiated 10/31/2011. Monitor platelet counts and any signs of bleeding  3. Mood: Continue Ritalin, now is on bid. Sleep-wake cycles have normalized may d/csleep graph D/C ritalin and monitor 4. Neuropsych: This patient is  capable of making decisions on his/her own behalf at this time.He has  poor awareness of deficits and grossly overestimates his physical and cognitive abilities  5. Hypertension. Norvasc 10 mg daily, clonidine patch 0.1 mg weekly, hydrochlorothiazide 25 mg daily, lisinopril 20 mg a.m. and 10 mg at bedtime. Monitor with increased mobility  6. Dysphagia. Mechanical soft diet thin liquids. Speech therapy followup. Monitor for any signs of aspiration.  7.  Sensory dysesthesia on L side. ? Recovery of sensation vs onset of thalamic pain syndrome, trial gabapentin at night, may need daytime dose if progressive  LOS (Days) 29 A FACE TO FACE EVALUATION WAS PERFORMED  Fallou Hulbert E 12/13/2011, 9:01 AM

## 2011-12-13 NOTE — Progress Notes (Signed)
I have read and agree with the following treatment session.  Raden Byington Hall, PT, DPT 

## 2011-12-13 NOTE — Progress Notes (Signed)
Speech Language Pathology Daily Session Note and Weekly Progress Note  Patient Details  Name: Anthony Hardin MRN: [id number] Date of Birth: 06/12/1950  Today's Date: 12/13/2011 Time: 1000-1030 Time Calculation (min): 30 min  Short Term Goals: Week 4: SLP Short Term Goal 1 (Week 4): Patient will alternate attention during a basic familiar task for 30 minutes with modified independence.  SLP Short Term Goal 2 (Week 4): Patient will demonstrate emergent awareness by asking for help when needed with supervision cues.  SLP Short Term Goal 3 (Week 4): Patient will solve complex functional problems with supervision cues.  Skilled Therapeutic Interventions: SLP initiated education about patient's scheduled medications.  Patient recalled medications with min assist verbal cues for types/function of medications (e.g. blood pressure, stool softener, etc.) although he reported that he could not remember the names of all the medications.  SLP facilitated increased recall of specific medication management by completing an external memory aid with scheduled medication name, function, and frequency with the patient.  Patient recalled medication names, function, and frequency with supervision cues after completing external memory aid.   Due to patient's increased cognitive function, SLP recommends decreasing frequency to 3x/week.     FIM:  Comprehension Comprehension Mode: Auditory Comprehension: 5-Understands complex 90% of the time/Cues < 10% of the time Expression Expression Mode: Verbal Expression: 6-Expresses complex ideas: With extra time/assistive device Social Interaction Social Interaction: 5-Interacts appropriately 90% of the time - Needs monitoring or encouragement for participation or interaction. Problem Solving Problem Solving: 5-Solves complex 90% of the time/cues < 10% of the time Memory Memory: 5-Recognizes or recalls 90% of the time/requires cueing < 10% of the time  Pain Pain  Assessment Pain Assessment: No/denies pain  Therapy/Group: Individual Therapy  PageJoni Reining 12/13/2011, 12:56 PM   Speech Language Pathology Weekly Progress Note  Patient Details  Name: Anthony Hardin MRN: [id number] Date of Birth: [birth year]  Today's Date: 12/13/2011  Short Term Goals: Week 4: SLP Short Term Goal 1 (Week 4): Patient will alternate attention during a basic familiar task for 30 minutes with modified independence.  SLP Short Term Goal 1 - Progress (Week 4): Met SLP Short Term Goal 2 (Week 4): Patient will demonstrate emergent awareness by asking for help when needed with supervision cues.  SLP Short Term Goal 2 - Progress (Week 4): Progressing toward goal SLP Short Term Goal 3 (Week 4): Patient will solve complex functional problems with supervision cues. SLP Short Term Goal 3 - Progress (Week 4): Met Week 5: SLP Short Term Goal 1 (Week 5): Patient will alternate attention during a moderately complex functional task for 30 minutes with min assist verbal cues SLP Short Term Goal 2 (Week 5): Patient will demonstrate emergent awareness by asking for help when needed with supervision cues. SLP Short Term Goal 3 (Week 5): Patient will solve complex functional problems with modified independence.   Weekly Progress Updates: Patient met 2 out of 3 short term goals with gains in attention and complex problem solving.  Patient exhibited alternating attention for 30 minutes with modified independence during a basic familiar task.  Patient also continues to demonstrate improved complex problem solving with supervision cues to self-monitor and self-correct.  Patient demonstrates improved emergent awareness and asks for help as needed with min assist verbal cues.  Due to patient's increased cognitive function, SLP recommends frequency of speech sessions be decreased to 3x/week.  Patient continues to be a good candidate for speech services during stay on CIR in order to maximize  functional independence and reduce burden of care upon discharge.     SLP Frequency: 1-2 X/day, 30-60 minutes;3 out of 7 days Estimated Length of Stay: anticipated discharge date 12/26/2011  SLP Treatment/Interventions: Cognitive remediation/compensation;Cueing hierarchy;Environmental controls;Medication managment;Internal/external aids;Functional tasks;Patient/family education;Therapeutic Activities;Speech/Language facilitation  [name], [name]  Graduate Clinician Speech Language Pathology  Page, [name] 12/13/2011, 2:32 PM   The above skilled treatment note has been reviewed and SLP is in agreement. [name], M.A., CCC-SLP [phone]

## 2011-12-13 NOTE — Progress Notes (Signed)
Occupational Therapy Session Note  Patient Details  Name: Anthony Hardin MRN: [id number] Date of Birth: 1951-01-08  Today's Date: 12/13/2011 Time: 1330-1430 Time Calculation (min): 60 min  Short Term Goals: Week 4:  OT Short Term Goal 1 (Week 4): Pt will complete UB dressing with min assist with hemi-technique  OT Short Term Goal 2 (Week 4): Pt will complete bathing at sit to stand level in shower with supervision OT Short Term Goal 3 (Week 4): Pt will complete LB dressing at sit to stand level with supervision OT Short Term Goal 4 (Week 4): Pt will complete grooming with min assist in standing  Skilled Therapeutic Interventions/Progress Updates:    Pt seen for 1:1 OT with focus on NM Re-ed in sidelying, quadruped, and standing.  Focus on A/AROM with external rotation and shoulder extension past neutral to attempt to stretch tight pecs.  Massage at pecs while in position above to attempt to release some of the tightness.  Engaged in weight shifting in quadruped with focus on shifting onto LLE when reaching forward with R/LUE, tactile cues at hips to elicit increased weight shift.  Standing activity at high-low table with focus on increased weight shifting through LLE while standing on 4" block with RLE; pt reaching outside BOS for object to Lt with RUE while weight bearing through LUE and then reaching with LUE with pt visually attending to LUE to increase mobility and coordination.  Trunk rotation in standing activity and FMC with grasping and releasing cards and pinch activity with resistive clothespins with 3 jaw chuck and lateral pinch with increased time.  Pt propelled self back to room in w/c.  Therapy Documentation Precautions:  Precautions Precautions: Fall Precaution Comments: L neglect Restrictions Weight Bearing Restrictions: No Pain: Pain Assessment Pain Assessment: No/denies pain  See FIM for current functional status  Therapy/Group: Individual Therapy  Leonette Monarch 12/13/2011, 3:42 PM

## 2011-12-13 NOTE — Progress Notes (Signed)
Social Work Patient ID: Anthony Hardin, male   DOB: 05-Aug-1950, 61 y.o.   MRN: [id number] Met with pt and had a conference call with sister and brother to inform team conference progression toward goals and discharge 10/15. Home eval possibly planned and person who will make adjustments to home wants to go with, coordinate with him.  Siblings coming up with a Plan for pt to return home with private duty assistance until nephew comes down, to be with pt.  Aware looking for a CNA to provide assistance and safety. All pleased with pt's progress and making arrangements for discharge.

## 2011-12-13 NOTE — Progress Notes (Signed)
Occupational Therapy Session Note  Patient Details  Name: Anthony Hardin MRN: [id number] Date of Birth: [birth year]  Today's Date: 12/13/2011 Time: 0805-0900 Time Calculation (min): 55 min  Short Term Goals: Week 1:  OT Short Term Goal 1 (Week 1): Pt will perform grooming activities seated at sink w/ Mod Assist OT Short Term Goal 1 - Progress (Week 1): Progressing toward goal OT Short Term Goal 2 (Week 1): Pt will perform UB bathing with Mod assist and  VC/TC's while seated at sink OT Short Term Goal 2 - Progress (Week 1): Met OT Short Term Goal 3 (Week 1): Pt will perform UB dressing/Hemi techniques seated with Mod Assist OT Short Term Goal 3 - Progress (Week 1): Met OT Short Term Goal 4 (Week 1): Pt will demonstrate task initiation for ADL/selfcare tasks on 1 out 3 occassions with verbal, visual or tactile cues OT Short Term Goal 4 - Progress (Week 1): Met Week 2:  OT Short Term Goal 1 (Week 2): Pt will perform grooming activities seated at sink w/ Mod Assist OT Short Term Goal 1 - Progress (Week 2): Met OT Short Term Goal 2 (Week 2): Pt will complete UB dressing with min assist with hemi-technique OT Short Term Goal 2 - Progress (Week 2): Progressing toward goal OT Short Term Goal 3 (Week 2): Pt will complete LB dressing with mod assist OT Short Term Goal 3 - Progress (Week 2): Met OT Short Term Goal 4 (Week 2): Pt will complete toilet transfer with mod assist in 2 out of 3 attempts OT Short Term Goal 4 - Progress (Week 2): Met Week 3:  OT Short Term Goal 1 (Week 3): Pt will complete UB dressing with min assist with hemi-technique  OT Short Term Goal 1 - Progress (Week 3): Progressing toward goal OT Short Term Goal 2 (Week 3): Pt will complete grooming with min assist OT Short Term Goal 2 - Progress (Week 3): Met OT Short Term Goal 3 (Week 3): Pt will complete LB dressing with min assist OT Short Term Goal 3 - Progress (Week 3): Met OT Short Term Goal 4 (Week 3): Pt will complete  toilet transfer with min assist 2 out of 3 attempts OT Short Term Goal 4 - Progress (Week 3): Met  Skilled Therapeutic Interventions/Progress Updates:    Pt seen for ADL retraining of B/D with a focus on functional mobility, standing balance, and use of LUE.  Pt was able to ambulate in/ out of shower with RW with min assist.  He was able to focus well today and donned shirt without cues. He only required one cue with donning underwear and pants to move his left arm prior to pulling his clothing up.  He was able to actively lift left arm on and off walker handle.  Stood at sink with supervision to complete grooming tasks.  Increased awareness of left leg position during mobility tasks.  Therapy Documentation Precautions:  Precautions Precautions: Fall Precaution Comments: L neglect Restrictions Weight Bearing Restrictions: No   Pain: Pain Assessment Pain Assessment: No/denies pain ADL: See FIM for current functional status  Therapy/Group: Individual Therapy  SAGUIER,JULIA 12/13/2011, 11:18 AM

## 2011-12-13 NOTE — Progress Notes (Addendum)
Physical Therapy Session Note  Patient Details  Name: Anthony Hardin MRN: [id number] Date of Birth: 1951-01-24  Today's Date: 12/13/2011 Time: 0900-1000 Time Calculation (min): 60 min  Short Term Goals: Week 3:  PT Short Term Goal 1 (Week 3): = LTG      Therapy Documentation Precautions:  Precautions Precautions: Fall Precaution Comments: L neglect Restrictions Weight Bearing Restrictions: No   Pain: Pain Assessment Pain Assessment: No/denies pain   Locomotion : Ambulation Ambulation: Yes Ambulation/Gait Assistance: 2: Max assist Ambulation Distance (Feet): 100 Feet; 100 feet     Other Treatments:   Patient ambulated from room <> PT gym (~100 feet each) with RW and mod/max assist. Patient ambulates with decreased step width, decreased hip/trunk rotation, increased difficulty with LLE clearance, genu recurvatum at L knee secondary to L hip retraction, and decreased weight shift to R secondary to pushing with RLE.  Patient required max vcs and tactile cues for AD management, sequencing, form, and safety. Patient performed forward/backward stepping with LLE with RLE on 2 inch block with emphasis on lateral weight shifting to R to assist with LLE foot clearance during gait and anterior translation of L hip over L foot during L stance phase. Performed forward/backward stepping with RLE with LLE on 2 inch block with emphasis on extending through LLE during stance phase to assist with RLE clearance during gait and getting anterior translation of L hip over L foot. Patient continued to present with L hip retraction during stance phase, decreased trunk rotation, and increased difficulty bringing L hip anteriorly over foot. Task changed to having patient perform sit <> stand without UE support and once in standing reach across midline to drop bean bag in basketball hoop. Task emphasized trunk/L hip rotation, forward progression, postural control and elongation of L trunk. Patient able to  perform with mod verbal and tactile cueing. Task progressed with having patient step with LLE and reach to R with emphasis on trunk/L hip rotation, forward progression, and postural control to carry over to gait.   See FIM for current functional status  Therapy/Group: Individual Therapy Anthony Hardin [place] DPT Student  12/13/2011, 10:25 AM

## 2011-12-14 ENCOUNTER — Inpatient Hospital Stay (HOSPITAL_COMMUNITY): Payer: BC Managed Care – PPO | Admitting: Physical Therapy

## 2011-12-14 ENCOUNTER — Inpatient Hospital Stay (HOSPITAL_COMMUNITY): Payer: BC Managed Care – PPO | Admitting: Occupational Therapy

## 2011-12-14 ENCOUNTER — Inpatient Hospital Stay (HOSPITAL_COMMUNITY): Payer: BC Managed Care – PPO | Admitting: Speech Pathology

## 2011-12-14 DIAGNOSIS — Z5189 Encounter for other specified aftercare: Secondary | ICD-10-CM

## 2011-12-14 DIAGNOSIS — I619 Nontraumatic intracerebral hemorrhage, unspecified: Secondary | ICD-10-CM

## 2011-12-14 DIAGNOSIS — G8110 Spastic hemiplegia affecting unspecified side: Secondary | ICD-10-CM

## 2011-12-14 NOTE — Progress Notes (Signed)
Patient ID: MARCIO HOQUE, male   DOB: [birth year], 61 y.o.   MRN: [id number] Admitted 10/31/2011 with diffuse weakness and unable to move and fell out of bed onto the floor. MRI of the brain showed a 4 x 1 x 2.5 cm acute hematoma right thalamus with mass effect and displacement distortion of the third ventricle but no suspicion of obstructive hydrocephalus. MRA of the head with no stenosis or occlusion.   Subjective/Complaints: Occ burning discomfort in L arm and Leg.  Patient does not feel that this is interfering with therapy thus far OT notes improving safety awareness Review of Systems  Musculoskeletal: Negative for myalgias.  Neurological: Positive for sensory change and focal weakness.  All other systems reviewed and are negative.   Objective: Vital Signs: Blood pressure 129/83, pulse 58, temperature 97.2 F (36.2 C), temperature source Oral, resp. rate 20, height 5\' 9"  (1.753 m), weight 70.3 kg (154 lb 15.7 oz), SpO2 100.00%. No results found. No results found for this or any previous visit (from the past 72 hour(s)).   HEENT: normal Cardio: RRR Resp: CTA B/L GI: BS positive Extremity:  No Edema Skin:   Intact Neuro: Confused, Abnormal Sensory absent sensation L side lt touch and proprio, Abnormal Motor 2/5 finger flex and arm ext, 2/5 hip/knee extensory synergy,0/5 L Ankle Abnormal FMC Reflexes 3+, Reflexes: 3+, Tone:  Hypertonia, Inattention and Other L homonymous hemianopsia Musc/Skel:  Normal  Sensation absent L arm and leg Assessment/Plan: 1. Functional deficits secondary to R thalamic bleed which require 3+ hours per day of interdisciplinary therapy in a comprehensive inpatient rehab setting. Physiatrist is providing close team supervision and 24 hour management of active medical problems listed below. Physiatrist and rehab team continue to assess barriers to discharge/monitor patient progress toward functional and medical goals. FIM: FIM - Bathing Bathing Steps  Patient Completed: Chest;Right Arm;Left Arm;Abdomen;Front perineal area;Buttocks;Right upper leg;Left upper leg;Right lower leg (including foot);Left lower leg (including foot) Bathing: 5: Supervision: Safety issues/verbal cues  FIM - Upper Body Dressing/Undressing Upper body dressing/undressing steps patient completed: Thread/unthread right sleeve of pullover shirt/dresss;Thread/unthread left sleeve of pullover shirt/dress;Put head through opening of pull over shirt/dress;Pull shirt over trunk Upper body dressing/undressing: 6: More than reasonable amount of time FIM - Lower Body Dressing/Undressing Lower body dressing/undressing steps patient completed: Thread/unthread right underwear leg;Thread/unthread left underwear leg;Pull underwear up/down;Thread/unthread right pants leg;Thread/unthread left pants leg;Pull pants up/down Lower body dressing/undressing: 4: Min-Patient completed 75 plus % of tasks  FIM - Toileting Toileting steps completed by patient: Adjust clothing prior to toileting;Performs perineal hygiene;Adjust clothing after toileting Toileting Assistive Devices: Grab bar or rail for support Toileting: 5: Supervision: Safety issues/verbal cues  FIM - Diplomatic Services operational officer Devices: Grab bars Toilet Transfers: 4-From toilet/BSC: Min A (steadying Pt. > 75%)  FIM - Bed/Chair Transfer Bed/Chair Transfer Assistive Devices: Therapist, occupational: 4: Chair or W/C > Bed: Min A (steadying Pt. > 75%)  FIM - Locomotion: Wheelchair Distance: 150 Locomotion: Wheelchair: 0: Activity did not occur FIM - Locomotion: Ambulation Locomotion: Ambulation Assistive Devices: Designer, industrial/product Ambulation/Gait Assistance: 2: Max assist Locomotion: Ambulation: 2: Travels 50 - 149 ft with maximal assistance (Pt: 25 - 49%)  Comprehension Comprehension Mode: Auditory Comprehension: 5-Understands basic 90% of the time/requires cueing < 10% of the time  Expression Expression  Mode: Verbal Expression: 6-Expresses complex ideas: With extra time/assistive device  Social Interaction Social Interaction: 5-Interacts appropriately 90% of the time - Needs monitoring or encouragement for participation or interaction.  Problem  Solving Problem Solving: 5-Solves complex 90% of the time/cues < 10% of the time  Memory Memory: 5-Recognizes or recalls 90% of the time/requires cueing < 10% of the time  2. Anticoagulation/DVT prophylaxis with non-Pharmaceutical:  Medical Problem List and Plan:  1. 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 DVT Prophylaxis/Anticoagulation: Subcutaneous Lovenox initiated 10/31/2011. Monitor platelet counts and any signs of bleeding  3. Mood: Continue Ritalin, now is on bid. Sleep-wake cycles have normalized may d/csleep graph D/C ritalin and monitor 4. Neuropsych: This patient is  capable of making decisions on his/her own behalf at this time.He has  poor awareness of deficits and grossly overestimates his physical and cognitive abilities  5. Hypertension. Norvasc 10 mg daily, clonidine patch 0.1 mg weekly, hydrochlorothiazide 25 mg daily, lisinopril 20 mg a.m. and 10 mg at bedtime. Monitor with increased mobility  6. Dysphagia. Mechanical soft diet thin liquids. Speech therapy followup. Monitor for any signs of aspiration.  7.  Sensory dysesthesia on L side. ? Recovery of sensation vs onset of thalamic pain syndrome, trial gabapentin at night, may need daytime dose if progressive  LOS (Days) 30 A FACE TO FACE EVALUATION WAS PERFORMED  KIRSTEINS,ANDREW E 12/14/2011, 7:09 AM

## 2011-12-14 NOTE — Progress Notes (Signed)
Social Work Patient ID: Anthony Hardin, male   DOB: [birth year], 61 y.o.   MRN: [id number] Met with pt to discuss [name] going to go to the home eval with pt and PT due to he will adapting pt's home. Will let know what time on Monday.  Pt pleased with his progress, but feels he has a long way to go still.

## 2011-12-14 NOTE — Progress Notes (Signed)
Physical Therapy Session Note  Patient Details  Name: Anthony Hardin MRN: [id number] Date of Birth: [birth year]  Today's Date: 12/14/2011 Time: 1115-1200 Time Calculation (min): 45 min Second Session:  14:00-14:30 30 min  Short Term Goals: Week 3:  PT Short Term Goal 1 (Week 3): = LTG  Skilled Therapeutic Interventions/Progress Updates:    See below for details, pt requesting to work on his gait, felt he needed to work on feeling the correct pattern.  Note pt with periods of neglecting the LUE.  Therapy Documentation Precautions:  Precautions Precautions: Fall Precaution Comments: L neglect Restrictions Weight Bearing Restrictions: No Pain: Pain Assessment Pain Assessment: No/denies pain Locomotion :   Neuro-reed using maxi-sky, RW, hand grip and Allard AFO.  Maxi Sky used to allow therapist to facilitate pt's LLE without fear of pt falling.  Facilitated correct swing through and stance phase on LLE, teaching pt to feel how the L hip translates forward and rotates the pelvis to the R while the trunk comes forward following heel strike.  Pt will inability to sustain the movement through the whole sequence consistently to prevent knee hyperextension. Performed multiple repetitions, with pt stating he felt like it was beneficial and wanted to work on this again. Second Session:  Continued per pt request with some gait training activity from am session, progressing to walking with bilateral HHA and increasing facilitation of hip extension through terminal stance. W/c mobility on the unit with supervision using R extremities. See FIM for current functional status  Therapy/Group: Individual Therapy  [name] 12/14/2011, 12:15 PM

## 2011-12-14 NOTE — Progress Notes (Signed)
Speech Language Pathology Daily Session Note  Patient Details  Name: Anthony Hardin MRN: [id number] Date of Birth: 1950/07/09  Today's Date: 12/14/2011 Time: 4098-1191 Time Calculation (min): 20 min  Short Term Goals: Week 5: SLP Short Term Goal 1 (Week 5): Patient will alternate attention during a moderately complex functional task for 30 minutes with min assist verbal cues SLP Short Term Goal 2 (Week 5): Patient will demonstrate emergent awareness by asking for help when needed with supervision cues. SLP Short Term Goal 3 (Week 5): Patient will solve complex functional problems with modified independence.   Skilled Therapeutic Interventions: Treatment session targeted cognition.  Patient solved a complex problem involving medication management and locating information on the internet with mod assist verbal cues for sequencing and completion of task in a timely manner.  Patient also made a phone call to the pharmacy to make basic inquiries regarding filling his prescriptions upon discharge with supervision cues to obtain requested information.     FIM:  Comprehension Comprehension Mode: Auditory Comprehension: 5-Understands complex 90% of the time/Cues < 10% of the time Expression Expression Mode: Verbal Expression: 6-Expresses complex ideas: With extra time/assistive device Social Interaction Social Interaction: 5-Interacts appropriately 90% of the time - Needs monitoring or encouragement for participation or interaction. Problem Solving Problem Solving: 5-Solves complex 90% of the time/cues < 10% of the time Memory Memory: 6-More than reasonable amt of time  Pain Pain Assessment Pain Assessment: No/denies pain  Therapy/Group: Individual Therapy  [name], Conrad [place]  Graduate Clinician Speech Language Pathology  Page, [name] 12/14/2011, 2:29 PM  The above skilled treatment note has been reviewed and SLP is in agreement. [name], M.A., CCC-SLP [phone]

## 2011-12-14 NOTE — Progress Notes (Signed)
Occupational Therapy Weekly Progress Note and Treatment Note  Patient Details  Name: Anthony Hardin MRN: [id number] Date of Birth: 05/02/1950  Today's Date: 12/14/2011 Time: 0915-1000 and 1330-1400 Time Calculation (min): 45 min and 30 min  Patient has met 3 of 4 short term goals.  Pt's goals have been upgraded to supervision overall with self-care tasks and transfers related to self-care.  Pt plans to d/c to his home with 24/7 hired help and nephew living with him.  Pt is currently supervision with bathing, grooming, and UB dressing, occasional min assist with LB dressing, and min assist with toilet and walk-in shower transfers.  Patient continues to demonstrate the following deficits: Lt hemiplegia, decreased motor control, decreased sensation and proprioception, impaired sequencing and coordination, apraxia, dynamic balance, and impaired postural control and therefore will continue to benefit from skilled OT intervention to enhance overall performance with BADL and Reduce care partner burden.  Patient progressing toward long term goals..  Continue plan of care.  OT Short Term Goals Week 4:  OT Short Term Goal 1 (Week 4): Pt will complete UB dressing with min assist with hemi-technique  OT Short Term Goal 1 - Progress (Week 4): Met OT Short Term Goal 2 (Week 4): Pt will complete bathing at sit to stand level in shower with supervision OT Short Term Goal 2 - Progress (Week 4): Met OT Short Term Goal 3 (Week 4): Pt will complete LB dressing at sit to stand level with supervision OT Short Term Goal 3 - Progress (Week 4): Progressing toward goal OT Short Term Goal 4 (Week 4): Pt will complete grooming with min assist in standing OT Short Term Goal 4 - Progress (Week 4): Met Week 5:  OT Short Term Goal 1 (Week 5): Pt will complete LB dressing at sit to stand level with supervision  OT Short Term Goal 2 (Week 5): Pt will complete grooming in standing with close supervision OT Short Term Goal  3 (Week 5): Pt will complete walk-in shower transfer with min assist OT Short Term Goal 4 (Week 5): Pt will complete toilet transfer with supervision  Skilled Therapeutic Interventions/Progress Updates:    1) Pt seen for ADL retraining with focus on functional mobility, standing balance, and use of LUE during self-care task of bathing and dressing.  Pt completed walk-in shower transfer with use of grab bar and side stepping method with cues for method and attention to LLE.  Pt demonstrated increased focus during bathing and dressing and required only supervision with bathing and dressing in sit <> stand position.  Min assist with donning shoes secondary to attempting a different pair and requiring assist to tie shoes.  Pt demonstrated increased awareness of LUE within functional task of dressing.    2) 1:1 OT with focus on NM re-ed in standing at high-low table.  Engaged in grading of pressure with grasp and release of items typical to daily life.  Use of cup with slits cut in to it to increase awareness of pressure when grasping a cup, added weights to cup to simulate cup filled with water.  Pt demonstrated bringing cup to mouth with LUE, reporting that he has been relying on Rt hand for self-feeding.  Pt with increased grasp on cup as it came closer to his mouth, but pt with no "spillage".  Discussed FM tasks involved in job - use of paper clips with pt obtaining from cup with Lt hand and putting 5 paper clips onto paper.  Standing task with  stacking cups with additional focus on ROM and grasp/release.  Therapy Documentation Precautions:  Precautions Precautions: Fall Precaution Comments: L neglect Restrictions Weight Bearing Restrictions: No Pain: Pain Assessment Pain Assessment: No/denies pain ADL: ADL Eating: Set up Where Assessed-Eating: Wheelchair Grooming: Modified independent Where Assessed-Grooming: Sitting at sink;Wheelchair Upper Body Bathing: Supervision/safety Where  Assessed-Upper Body Bathing: Shower Lower Body Bathing: Supervision/safety Where Assessed-Lower Body Bathing: Shower Upper Body Dressing: Modified independent (Device) Where Assessed-Upper Body Dressing: Sitting at sink Lower Body Dressing: Minimal assistance;Minimal cueing Where Assessed-Lower Body Dressing: Sitting at sink;Standing at sink Toileting: Supervision/safety Where Assessed-Toileting: Teacher, adult education: Curator Method: Ambulating (with RW) Acupuncturist: Engineer, technical sales: Not assessed Film/video editor: Insurance underwriter Method: Stand pivot;Ambulating Astronomer: Transfer tub bench;Grab bars ADL Comments: Pt attending to LUE more in functional tasks, continues to have extreme sensory loss. Pt with increased initiation and spontaneous use of LUE in functional self-care tasks. Pt with increased attention to Lt side of body with bathing and grooming tasks.  See FIM for current functional status  Therapy/Group: Individual Therapy  Leonette Monarch 12/14/2011, 12:15 PM

## 2011-12-15 ENCOUNTER — Inpatient Hospital Stay (HOSPITAL_COMMUNITY): Payer: BC Managed Care – PPO | Admitting: Physical Therapy

## 2011-12-15 ENCOUNTER — Inpatient Hospital Stay (HOSPITAL_COMMUNITY): Payer: BC Managed Care – PPO | Admitting: Occupational Therapy

## 2011-12-15 NOTE — Progress Notes (Signed)
Patient ID: Anthony Hardin, male   DOB: [birth year], 61 y.o.   MRN: [id number] Admitted 10/31/2011 with diffuse weakness and unable to move and fell out of bed onto the floor. MRI of the brain showed a 4 x 1 x 2.5 cm acute hematoma right thalamus with mass effect and displacement distortion of the third ventricle but no suspicion of obstructive hydrocephalus. MRA of the head with no stenosis or occlusion.   Subjective/Complaints: Occ burning discomfort in L arm and Leg.  Patient does not feel that this is interfering with therapy thus far Up with harness in PT yesterday Review of Systems  Musculoskeletal: Negative for myalgias.  Neurological: Positive for sensory change and focal weakness.  All other systems reviewed and are negative.   Objective: Vital Signs: Blood pressure 121/79, pulse 75, temperature 98.2 F (36.8 C), temperature source Oral, resp. rate 20, height 5\' 9"  (1.753 m), weight 70.3 kg (154 lb 15.7 oz), SpO2 100.00%. No results found. No results found for this or any previous visit (from the past 72 hour(s)).   HEENT: normal Cardio: RRR Resp: CTA B/L GI: BS positive Extremity:  No Edema Skin:   Intact Neuro: Confused, Abnormal Sensory absent sensation L side lt touch and proprio, Abnormal Motor 2/5 finger flex and arm ext, 2/5 hip/knee extensory synergy,0/5 L Ankle Abnormal FMC Reflexes 3+, Reflexes: 3+, Tone:  Hypertonia, Inattention and Other L homonymous hemianopsia Musc/Skel:  Normal  Sensation absent L arm and leg Assessment/Plan: 1. Functional deficits secondary to R thalamic bleed which require 3+ hours per day of interdisciplinary therapy in a comprehensive inpatient rehab setting. Physiatrist is providing close team supervision and 24 hour management of active medical problems listed below. Physiatrist and rehab team continue to assess barriers to discharge/monitor patient progress toward functional and medical goals. FIM: FIM - Bathing Bathing Steps Patient  Completed: Chest;Right Arm;Left Arm;Abdomen;Front perineal area;Buttocks;Right upper leg;Left upper leg;Right lower leg (including foot);Left lower leg (including foot) Bathing: 5: Supervision: Safety issues/verbal cues  FIM - Upper Body Dressing/Undressing Upper body dressing/undressing steps patient completed: Thread/unthread right sleeve of pullover shirt/dresss;Thread/unthread left sleeve of pullover shirt/dress;Put head through opening of pull over shirt/dress;Pull shirt over trunk Upper body dressing/undressing: 6: More than reasonable amount of time FIM - Lower Body Dressing/Undressing Lower body dressing/undressing steps patient completed: Thread/unthread right underwear leg;Thread/unthread left underwear leg;Pull underwear up/down;Thread/unthread right pants leg;Thread/unthread left pants leg;Pull pants up/down;Don/Doff right shoe Lower body dressing/undressing: 4: Min-Patient completed 75 plus % of tasks  FIM - Toileting Toileting steps completed by patient: Adjust clothing prior to toileting;Performs perineal hygiene;Adjust clothing after toileting Toileting Assistive Devices: Grab bar or rail for support Toileting: 5: Supervision: Safety issues/verbal cues  FIM - Diplomatic Services operational officer Devices: Grab bars Toilet Transfers: 4-From toilet/BSC: Min A (steadying Pt. > 75%)  FIM - Bed/Chair Transfer Bed/Chair Transfer Assistive Devices: Therapist, occupational: 5: Bed > Chair or W/C: Supervision (verbal cues/safety issues);5: Chair or W/C > Bed: Supervision (verbal cues/safety issues)  FIM - Locomotion: Wheelchair Distance: 150 Locomotion: Wheelchair: 2: Travels 50 - 149 ft with supervision, cueing or coaxing FIM - Locomotion: Ambulation Locomotion: Ambulation Assistive Devices: MaxiSky;Walker - Rolling Ambulation/Gait Assistance: 3: Mod assist;1: +2 Total assist Locomotion: Ambulation: 2: Travels 50 - 149 ft with maximal assistance (Pt: 25 -  49%)  Comprehension Comprehension Mode: Auditory Comprehension: 5-Understands complex 90% of the time/Cues < 10% of the time  Expression Expression Mode: Verbal Expression: 5-Expresses complex 90% of the time/cues < 10% of the time  Social Interaction Social Interaction: 5-Interacts appropriately 90% of the time - Needs monitoring or encouragement for participation or interaction.  Problem Solving Problem Solving: 5-Solves complex 90% of the time/cues < 10% of the time  Memory Memory: 5-Recognizes or recalls 90% of the time/requires cueing < 10% of the time  2. Anticoagulation/DVT prophylaxis with non-Pharmaceutical:  Medical Problem List and Plan:  1. 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 DVT Prophylaxis/Anticoagulation: Subcutaneous Lovenox initiated 10/31/2011. Monitor platelet counts and any signs of bleeding  3. Mood: Continue Ritalin, now is on bid. Sleep-wake cycles have normalized may d/csleep graph D/C ritalin and monitor 4. Neuropsych: This patient is  capable of making decisions on his/her own behalf at this time.He has  poor awareness of deficits and grossly overestimates his physical and cognitive abilities  5. Hypertension. Norvasc 10 mg daily, clonidine patch 0.1 mg weekly, hydrochlorothiazide 25 mg daily, lisinopril 20 mg a.m. and 10 mg at bedtime. Monitor with increased mobility  6. Dysphagia. Mechanical soft diet thin liquids. Speech therapy followup. Monitor for any signs of aspiration.  7.  Sensory dysesthesia on L side. ? Recovery of sensation vs onset of thalamic pain syndrome, trial gabapentin at night, may need daytime dose if progressive  LOS (Days) 31 A FACE TO FACE EVALUATION WAS PERFORMED  KIRSTEINS,ANDREW E 12/15/2011, 8:10 AM

## 2011-12-15 NOTE — Progress Notes (Signed)
Physical Therapy Session Note  Patient Details  Name: Anthony Hardin MRN: [id number] Date of Birth: 1950/03/24  Today's Date: 12/15/2011 Time: 0900-1000, 1300-1400 Time Calculation (min): 60 min, 60 min  Short Term Goals: Week 2:  PT Short Term Goal 1 (Week 2): Patient will perform bed mobility on flat bed min A to L and R PT Short Term Goal 1 - Progress (Week 2): Met PT Short Term Goal 2 (Week 2): Patient will perform functional transfers to L and R with min A and mod-max verbal cues for initiation, sequence and to attend to L PT Short Term Goal 2 - Progress (Week 2): Met PT Short Term Goal 3 (Week 2): Patient will perform w/c mobility on unit x 150 with mod A and mod-max verbal and visual cues to attend to L environment PT Short Term Goal 3 - Progress (Week 2): Met PT Short Term Goal 4 (Week 2): Patient will perform gait training on unit x 75' with max A of one person with increased LLE advancement and activation of LLE in stance. PT Short Term Goal 4 - Progress (Week 2): Met Week 3:  PT Short Term Goal 1 (Week 3): = LTG     Therapy Documentation Precautions:  Precautions Precautions: Fall Precaution Comments: L neglect Restrictions Weight Bearing Restrictions: No    Vital Signs: Therapy Vitals Pulse Rate: 80  Resp: 20  BP: 126/87 mmHg Patient Position, if appropriate: Lying Pain: Pain Assessment Pain Assessment: No/denies pain Mobility:  Patient sitting in w/c upon PT arrival. Patient performed all w/c management with supervision, requiring min cuing to attend to L side. Patient propelled w/c from room > PT gym (~100 feet) using R hemi technique and supervision. Patient demonstrated ability to navigate in narrow spaces while attending to L side.     Locomotion : Ambulation Ambulation: Yes Ambulation/Gait Assistance: Other (comment) (Using LiteGait on TM) Ambulation Distance (Feet): 329 Feet, 362 feet Patient perform gait training on TM using LiteGait and two  helpers for two bouts with emphasis on lateral weight shift to R to aid with swing phase on L, forward/anterior progression of L hip, knee flexion on L to initiate swing phase, and heel strike on L when beginning stance. Patient required max verbal and tactile cues for timing, sequencing, form, and posture. Manual facilitation was provided at L knee/foot and at hips/trunks during ambulation to assist with form.  First bout: 9 min 26 sec, 329 feet at .4 mph. Second bout: 7 min 7 sec, 362 feet @ .6 mph.    Other Treatments:   Second session: Patient propelled w/c from room > PT gym (~100 feet) with supervision. Attempted hip flexor stretch in prone, unable to get correct form so modified stretch to supine with LLE off mat x 1 min x 3 reps. Patient performed supine bridging with LLE off mat, on 4 inch block with emphasis on L hip extension, hip and trunk rotation and motor control. Patient instructed to reach across midline with LUE to further emphasis hip and trunk rotation. Patient performed exercise well, needing min/mod verbal and tactile cues. Patient performed wall push ups with hip rotation with BUE support to assist with closed chain anterior tibial translation, as well as assist patient in being comfortable leaning outside BoS. Patient required mod/max verbal and tactile cues to achieve correct form. Patient ambulated back to room from PT gym (~100 feet) with two person assist, one in front for UE support and to assist with trunk rotation and maintaining  forward momentum. Resistance applied by second helper during half the walk at hips to encourage forward/anterior progression of hip. Other half, manual facilitation was applied to encourage R weight shift and erect posture.  See FIM for current functional status  Therapy/Group: Individual Therapy  Hester Forget Hamilton DPT Student  12/15/2011, 10:33 AM

## 2011-12-15 NOTE — Progress Notes (Signed)
I have read and agree with the following treatment session.  Addisynn Vassell Hall, PT, DPT 

## 2011-12-15 NOTE — Progress Notes (Signed)
Occupational Therapy Session Note  Patient Details  Name: Anthony Hardin MRN: [id number] Date of Birth: 13-Aug-1950  Today's Date: 12/15/2011 Time: [phone] and [id number] Time Calculation (min): 45 min and 30 min  Short Term Goals: Week 5:  OT Short Term Goal 1 (Week 5): Pt will complete LB dressing at sit to stand level with supervision  OT Short Term Goal 2 (Week 5): Pt will complete grooming in standing with close supervision OT Short Term Goal 3 (Week 5): Pt will complete walk-in shower transfer with min assist OT Short Term Goal 4 (Week 5): Pt will complete toilet transfer with supervision  Skilled Therapeutic Interventions/Progress Updates:    1) Pt seen for ADL retraining with focus on increased standing balance, transfer training with RW, and increased carryover of safety and attention to Lt side with LB dressing.  Pt min assist walk-in shower transfer with cues for LLE placement.  Pt completed bathing and dressing at supervision level, demonstrating ability to problem solve when put Lt leg in wrong leg hole without requiring cues.  Pt completed grooming in standing with close supervision. Initiated discuss of shoe buttons to eliminate need to tie shoes at this time.  2) 1:1 OT with focus on standing balance without UE support, use of LUE with FMC, and focus on use of AE for fastening shoes/eliminating shoe tying.  Engaged in card game activity in standing with focus on use of LUE to maintain cards in hand while placing them on table with Rt hand.  Pt reports increased instability in standing without use of UE support or leaning against high-low table.  Issued pt shoe buttons and put them on his shoes.  Demonstrated how to hook shoe laces and pt return demonstrated and reports no questions.  Therapy Documentation Precautions:  Precautions Precautions: Fall Precaution Comments: L neglect Restrictions Weight Bearing Restrictions: No General:   Vital Signs: Therapy Vitals Temp:  98.2 F (36.8 C) Temp src: Oral Pulse Rate: 75  Resp: 20  BP: 121/79 mmHg Patient Position, if appropriate: Lying Oxygen Therapy SpO2: 100 % O2 Device: None (Room air) Pain:   Pt with no c/o pain this session.  See FIM for current functional status  Therapy/Group: Individual Therapy  Leonette Monarch 12/15/2011, 8:18 AM

## 2011-12-15 NOTE — Progress Notes (Signed)
Social Work Patient ID: Anthony Hardin, male   DOB: 05-09-1950, 61 y.o.   MRN: [id number] Spoke with Audra-PT who reports home eval will be 1:00-3:--pm on Monday.  Have contacted Richard-pt's friend who will be there to assist with any  adaptations then.  Pt aware and agreeable.

## 2011-12-16 ENCOUNTER — Inpatient Hospital Stay (HOSPITAL_COMMUNITY): Payer: BC Managed Care – PPO | Admitting: *Deleted

## 2011-12-16 NOTE — Progress Notes (Signed)
Patient ID: Anthony Hardin, male   DOB: 07-Jul-1950, 61 y.o.   MRN: [id number] Admitted 10/31/2011 with diffuse weakness and unable to move and fell out of bed onto the floor. MRI of the brain showed a 4 x 1 x 2.5 cm acute hematoma right thalamus with mass effect and displacement distortion of the third ventricle but no suspicion of obstructive hydrocephalus. MRA of the head with no stenosis or occlusion.  No complaints today  Subjective/Complaints: No specific complaints- feels he is making progress with therapy.   Objective: Vital Signs: Blood pressure 110/71, pulse 54, temperature 98.2 F (36.8 C), temperature source Oral, resp. rate 17, height 5\' 9"  (1.753 m), weight 154 lb 15.7 oz (70.3 kg), SpO2 97.00%.   well-developed well-nourished male in no acute distress. HEENT exam atraumatic, normocephalic, neck supple without jugular venous distention. Chest clear to auscultation cardiac exam S1-S2 are regular. Abdominal exam overweight with bowel sounds, soft and nontender. Extremities no edema. Neurologic exam is alert  Assessment/Plan: 1. Functional deficits secondary to R thalamic bleed which require 3+ hours per day of interdisciplinary therapy in a comprehensive inpatient rehab setting. Physiatrist is providing close team supervision and 24 hour management of active medical problems listed below. Physiatrist and rehab team continue to assess barriers to discharge/monitor patient progress toward functional and medical goals.  2. Anticoagulation/DVT prophylaxis with non-Pharmaceutical:  Medical Problem List and Plan:  1. 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 DVT Prophylaxis/Anticoagulation: Subcutaneous Lovenox initiated 10/31/2011. Monitor platelet counts and any signs of bleeding  3. Mood: Continue Ritalin, now is on bid. Sleep-wake cycles have normalized may d/csleep graph D/C ritalin and monitor 4. Neuropsych: This patient is   capable of making decisions on his/her own behalf at this time.He has  poor awareness of deficits and grossly overestimates his physical and cognitive abilities  5. Hypertension. Norvasc 10 mg daily, clonidine patch 0.1 mg weekly, hydrochlorothiazide 25 mg daily, lisinopril 20 mg a.m. and 10 mg at bedtime.  BP Readings from Last 4 Encounters:  12/16/11 110/71  11/14/11 125/71    6. Dysphagia. Mechanical soft diet thin liquids. Speech therapy followup. Monitor for any signs of aspiration.  7.  Sensory dysesthesia on L side. ? Recovery of sensation vs onset of thalamic pain syndrome, trial gabapentin at night, may need daytime dose if progressive  LOS (Days) 32 A FACE TO FACE EVALUATION WAS PERFORMED  SWORDS,BRUCE HENRY 12/16/2011, 9:25 AM

## 2011-12-16 NOTE — Progress Notes (Signed)
Physical Therapy Session Note  Patient Details  Name: Anthony Hardin MRN: [id number] Date of Birth: 22-Dec-1950  Today's Date: 12/16/2011 Time: 4098-1191 Time Calculation (min): 33 min   Skilled Therapeutic Interventions/Progress Updates:    there-ex to increase strength and motor control obliques, LLE, (oblique sit ups, sidelying hip ER, sit to stands while pushing thru LUE- balance worsens if pt concentrates on UE, raise body weight up step with LLE and LUE on rail)  Pt reports new onset tingling L foot, denies pain  Therapy Documentation Precautions:  Precautions Precautions: Fall Precaution Comments: L inattention Restrictions Weight Bearing Restrictions: No Therapy/Group: Individual Therapy  Michaelene Song 12/16/2011, 3:31 PM

## 2011-12-17 ENCOUNTER — Inpatient Hospital Stay (HOSPITAL_COMMUNITY): Payer: BC Managed Care – PPO | Admitting: Physical Therapy

## 2011-12-17 NOTE — Progress Notes (Signed)
Patient ID: Anthony Hardin, male   DOB: January 21, 1951, 61 y.o.   MRN: [id number] Admitted 10/31/2011 with diffuse weakness and unable to move and fell out of bed onto the floor. MRI of the brain showed a 4 x 1 x 2.5 cm acute hematoma right thalamus with mass effect and displacement distortion of the third ventricle but no suspicion of obstructive hydrocephalus. MRA of the head with no stenosis or occlusion.   Feels well, no complaints  Subjective/Complaints: No specific complaints- feels he is making progress with therapy.   Objective: Vital Signs: Blood pressure 109/70, pulse 54, temperature 98.2 F (36.8 C), temperature source Oral, resp. rate 18, height 5\' 9"  (1.753 m), weight 154 lb 15.7 oz (70.3 kg), SpO2 93.00%.   well-developed well-nourished male in no acute distress. HEENT exam atraumatic, normocephalic, neck supple without jugular venous distention. Chest clear to auscultation cardiac exam S1-S2 are regular. Abdominal exam overweight with bowel sounds, soft and nontender. Extremities no edema. Neurologic exam is alert  Assessment/Plan: 1. Functional deficits secondary to R thalamic bleed which require 3+ hours per day of interdisciplinary therapy in a comprehensive inpatient rehab setting. Physiatrist is providing close team supervision and 24 hour management of active medical problems listed below. Physiatrist and rehab team continue to assess barriers to discharge/monitor patient progress toward functional and medical goals.  2. Anticoagulation/DVT prophylaxis with non-Pharmaceutical:  Medical Problem List and Plan:  1. 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 DVT Prophylaxis/Anticoagulation: Subcutaneous Lovenox initiated 10/31/2011. Monitor platelet counts and any signs of bleeding  3. Mood: Continue Ritalin, now is on bid. Sleep-wake cycles have normalized may d/csleep graph D/C ritalin and monitor 4. Neuropsych: This  patient is  capable of making decisions on his/her own behalf at this time.He has  poor awareness of deficits and grossly overestimates his physical and cognitive abilities  5. Hypertension. Norvasc 10 mg daily, clonidine patch 0.1 mg weekly, hydrochlorothiazide 25 mg daily, lisinopril 20 mg a.m. and 10 mg at bedtime.  BP Readings from Last 4 Encounters:  12/17/11 109/70  11/14/11 125/71  bp is adequately controlled.  6. Dysphagia. Mechanical soft diet thin liquids. Speech therapy followup. Monitor for any signs of aspiration.  7.  Sensory dysesthesia on L side. ? Recovery of sensation vs onset of thalamic pain syndrome, trial gabapentin at night, may need daytime dose if progressive  LOS (Days) 33 A FACE TO FACE EVALUATION WAS PERFORMED  SWORDS,BRUCE HENRY 12/17/2011, 11:11 AM

## 2011-12-17 NOTE — Progress Notes (Signed)
Physical Therapy Note  Patient Details  Name: PAYDEN VALLELY MRN: [id number] Date of Birth: Jul 12, 1950 Today's Date: 12/17/2011  [id number] (30 minutes) individual Pain: no complaint of pain Focus of treatment: Neuro re-ed LT LE in supine/prone/standing; gait training without AD to facilitate balance reactions Treatment: Transfers - squat/pivot wc >< mat ; supine Lt hip flexion/extension with min/mod manual resistance; sidelying LT hip abduction; prone knee flexion with decreased eccentric control; prone hip extension; standing with RT UE support stepping forward /backward to target with emphasis on stepping back with knee flexed (hamstring control) min assist for balance; gait without AD mod assist 15 feet X 2 with assist to prevent LT knee hyperextension and maintain standing balance.   Ladina Shutters,JIM 12/17/2011, 7:32 AM

## 2011-12-18 ENCOUNTER — Inpatient Hospital Stay (HOSPITAL_COMMUNITY): Payer: BC Managed Care – PPO | Admitting: Occupational Therapy

## 2011-12-18 ENCOUNTER — Inpatient Hospital Stay (HOSPITAL_COMMUNITY): Payer: BC Managed Care – PPO | Admitting: Speech Pathology

## 2011-12-18 ENCOUNTER — Inpatient Hospital Stay (HOSPITAL_COMMUNITY): Payer: BC Managed Care – PPO | Admitting: Physical Therapy

## 2011-12-18 NOTE — Progress Notes (Signed)
Occupational Therapy Session Note  Patient Details  Name: Anthony Hardin MRN: [id number] Date of Birth: [birth year]  Today's Date: 12/18/2011 Time: 0730-0830 and 1300-1345(co-tx with PT 1300-1500) Time Calculation (min): 60 min and 45 min  Short Term Goals: Week 5:  OT Short Term Goal 1 (Week 5): Pt will complete LB dressing at sit to stand level with supervision  OT Short Term Goal 2 (Week 5): Pt will complete grooming in standing with close supervision OT Short Term Goal 3 (Week 5): Pt will complete walk-in shower transfer with min assist OT Short Term Goal 4 (Week 5): Pt will complete toilet transfer with supervision  Skilled Therapeutic Interventions/Progress Updates:    1) Pt seen for ADL retraining with focus on functional mobility and transfers with use of RW, increased safety and independence with self-care tasks of bathing and dressing, and carryover of hemi-technique with dressing tasks.  Ambulated from bathroom door to walk-in shower with light min/steady assist and min cues for advancement of LLE.  Min cues provided for attention to LLE prior to sit to stand for bathing of periarea.  Pt overall supervision with bathing and dressing, requiring min cues for donning Lt shoe with AFO and to use shoe button to fasten shoe.  2) Pt seen for co-tx with PT with home eval to prepare for d/c home next Tuesday.  Pt required min assist with accessing first floor bathroom through narrow doorways with sidestepping and cues for each step.  Problem solved grab bar placement to increase independence with toilet transfer and walk-in shower transfer.  Recommend throw rugs be removed and shelves outside shower be moved to improve access into walk-in shower.  Shower door opening 23" and width of shower stall 31.5".  Anticipate pt with require min assist with bathroom mobility secondary to tight spaces.  Pt demonstrated increased difficulty with problem solving in bathroom and kitchen.  Pt will benefit from  increased exposure to kitchen environment in ADL apartment prior to d/c home.  Pt reports home eval to be very beneficial to him to answer some of his concerns and allow some of our suggestions to truly set in.  Therapy Documentation Precautions:  Precautions Precautions: Fall Precaution Comments: L neglect Restrictions Weight Bearing Restrictions: No General:   Vital Signs: Therapy Vitals Temp: 97.8 F (36.6 C) Temp src: Oral Pulse Rate: 57  Resp: 18  BP: 112/74 mmHg Patient Position, if appropriate: Lying Oxygen Therapy SpO2: 98 % O2 Device: None (Room air) Pain:  Pt with no c/o pain this session.  See FIM for current functional status  Therapy/Group: Individual Therapy and Co-Treatment  Leonette Monarch 12/18/2011, 8:32 AM

## 2011-12-18 NOTE — Progress Notes (Signed)
Speech Language Pathology Daily Session Note  Patient Details  Name: Anthony Hardin MRN: [id number] Date of Birth: 1950-03-24  Today's Date: 12/18/2011 Time: 1015-1100 Time Calculation (min): 45 min  Short Term Goals: Week 5: SLP Short Term Goal 1 (Week 5): Patient will alternate attention during a moderately complex functional task for 30 minutes with min assist verbal cues SLP Short Term Goal 2 (Week 5): Patient will demonstrate emergent awareness by asking for help when needed with supervision cues. SLP Short Term Goal 3 (Week 5): Patient will solve complex functional problems with modified independence.   Skilled Therapeutic Interventions: Treatment session targeted cognition.  Patient identified his target blood pressure as well as scheduled times for taking his blood pressure upon discharge with supervision verbal cues.  Patient conducted an internet search for a home-use blood pressure monitor with min assist faded to supervision verbal cues to navigate amongst search results, locate requested information, and select an appropriate blood pressure monitor.  Patient also composed an email to his brother-in-law with modified independence in order to give him information about the selected blood pressure monitor so that he can purchase it and it will be available at home upon discharge.     FIM:  Comprehension Comprehension Mode: Auditory Comprehension: 6-Follows complex conversation/direction: With extra time/assistive device Expression Expression Mode: Verbal Expression: 6-Expresses complex ideas: With extra time/assistive device Social Interaction Social Interaction: 6-Interacts appropriately with others with medication or extra time (anti-anxiety, antidepressant). Problem Solving Problem Solving: 5-Solves complex 90% of the time/cues < 10% of the time Memory Memory: 6-More than reasonable amt of time  Pain Pain Assessment Pain Assessment: No/denies pain  Therapy/Group:  Individual Therapy  [name], Conrad [place]  Graduate Clinician Speech Language Pathology  Page, [name] 12/18/2011, 2:25 PM  The above skilled treatment note has been reviewed and SLP is in agreement. [name], M.A., CCC-SLP [phone]

## 2011-12-18 NOTE — Progress Notes (Signed)
Patient ID: Anthony Hardin, male   DOB: [birth year], 61 y.o.   MRN: [id number] Admitted 10/31/2011 with diffuse weakness and unable to move and fell out of bed onto the floor. MRI of the brain showed a 4 x 1 x 2.5 cm acute hematoma right thalamus with mass effect and displacement distortion of the third ventricle but no suspicion of obstructive hydrocephalus. MRA of the head with no stenosis or occlusion.   Subjective/Complaints: Occ burning discomfort in L arm and Leg.  Patient does not feel that this is interfering with therapy thus far Up with harness in PT yesterday Review of Systems  Musculoskeletal: Negative for myalgias.  Neurological: Positive for sensory change and focal weakness.  All other systems reviewed and are negative.  Frontal HA at end of day.  Some eye strain reported while trying to focus Objective: Vital Signs: Blood pressure 121/76, pulse 57, temperature 97.8 F (36.6 C), temperature source Oral, resp. rate 18, height 5\' 9"  (1.753 m), weight 70.3 kg (154 lb 15.7 oz), SpO2 98.00%. No results found. No results found for this or any previous visit (from the past 72 hour(s)).   HEENT: normal Cardio: RRR Resp: CTA B/L GI: BS positive Extremity:  No Edema Skin:   Intact Neuro: Confused, Abnormal Sensory absent sensation L side lt touch and proprio, Abnormal Motor 2/5 finger flex and arm ext, 2/5 hip/knee extensory synergy,0/5 L Ankle Abnormal FMC Reflexes 3+, Reflexes: 3+, Tone:  Hypertonia, Inattention and Other L homonymous hemianopsia Musc/Skel:  Normal  Sensation absent L arm and leg Assessment/Plan: 1. Functional deficits secondary to R thalamic bleed which require 3+ hours per day of interdisciplinary therapy in a comprehensive inpatient rehab setting. Physiatrist is providing close team supervision and 24 hour management of active medical problems listed below. Physiatrist and rehab team continue to assess barriers to discharge/monitor patient progress toward  functional and medical goals. FIM: FIM - Bathing Bathing Steps Patient Completed: Chest;Right Arm;Left Arm;Abdomen;Front perineal area;Buttocks;Right upper leg;Left upper leg;Right lower leg (including foot);Left lower leg (including foot) Bathing: 5: Supervision: Safety issues/verbal cues  FIM - Upper Body Dressing/Undressing Upper body dressing/undressing steps patient completed: Thread/unthread right sleeve of pullover shirt/dresss;Thread/unthread left sleeve of pullover shirt/dress;Put head through opening of pull over shirt/dress;Pull shirt over trunk Upper body dressing/undressing: 6: More than reasonable amount of time FIM - Lower Body Dressing/Undressing Lower body dressing/undressing steps patient completed: Thread/unthread right underwear leg;Thread/unthread left underwear leg;Pull underwear up/down;Thread/unthread right pants leg;Thread/unthread left pants leg;Pull pants up/down;Don/Doff right shoe Lower body dressing/undressing: 4: Min-Patient completed 75 plus % of tasks  FIM - Toileting Toileting steps completed by patient: Adjust clothing prior to toileting;Performs perineal hygiene;Adjust clothing after toileting Toileting Assistive Devices: Grab bar or rail for support Toileting: 5: Supervision: Safety issues/verbal cues  FIM - Diplomatic Services operational officer Devices: Grab bars Toilet Transfers: 4-From toilet/BSC: Min A (steadying Pt. > 75%)  FIM - Bed/Chair Transfer Bed/Chair Transfer Assistive Devices: Therapist, occupational: 5: Supine > Sit: Supervision (verbal cues/safety issues)  FIM - Locomotion: Wheelchair Distance: 150 Locomotion: Wheelchair: 6: Travels 150 ft or more, turns around, maneuvers to table, bed or toilet, negotiates 3% grade: maneuvers on rugs and over door sills independently FIM - Locomotion: Ambulation Locomotion: Ambulation Assistive Devices: Orthosis Ambulation/Gait Assistance: Other (comment) (Using LiteGait on TM) Locomotion:  Ambulation: 1: Travels less than 50 ft with moderate assistance (Pt: 50 - 74%)  Comprehension Comprehension Mode: Auditory Comprehension: 6-Follows complex conversation/direction: With extra time/assistive device  Expression Expression Mode: Verbal Expression:  6-Expresses complex ideas: With extra time/assistive device  Social Interaction Social Interaction: 5-Interacts appropriately 90% of the time - Needs monitoring or encouragement for participation or interaction.  Problem Solving Problem Solving: 5-Solves complex 90% of the time/cues < 10% of the time  Memory Memory: 5-Recognizes or recalls 90% of the time/requires cueing < 10% of the time  2. Anticoagulation/DVT prophylaxis with non-Pharmaceutical:  Medical Problem List and Plan:  1. 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 DVT Prophylaxis/Anticoagulation: Subcutaneous Lovenox initiated 10/31/2011. Monitor platelet counts and any signs of bleeding  3. Mood: Continue Ritalin, now is on bid. Sleep-wake cycles have normalized may d/c sleep graph D/C ritalin and monitor 4. Neuropsych: This patient is  capable of making decisions on his/her own behalf at this time.He has  poor awareness of deficits and grossly overestimates his physical and cognitive abilities  5. Hypertension. Norvasc 10 mg daily, clonidine patch 0.1 mg weekly, hydrochlorothiazide 25 mg daily, lisinopril 20 mg a.m. and 10 mg at bedtime. Monitor with increased mobility  6. Dysphagia. Mechanical soft diet thin liquids. Speech therapy followup. Monitor for any signs of aspiration.  7.  Sensory dysesthesia on L side. ? Recovery of sensation vs onset of thalamic pain syndrome, trial gabapentin at night, may need daytime dose if progressive  LOS (Days) 34 A FACE TO FACE EVALUATION WAS PERFORMED  Decarla Siemen E 12/18/2011, 7:32 AM

## 2011-12-18 NOTE — Progress Notes (Signed)
Social Work Patient ID: Anthony Hardin, male   DOB: February 06, 1951, 61 y.o.   MRN: [id number] [employer] clinical update sent

## 2011-12-18 NOTE — Progress Notes (Signed)
Physical Therapy Session Note  Patient Details  Name: Anthony Hardin MRN: [id number] Date of Birth: 1950/10/06  Today's Date: 12/18/2011 Time: 1345-1430 Time Calculation (min): 45 min  Short Term Goals: Week 3:  PT Short Term Goal 1 (Week 3): = LTG  Skilled Therapeutic Interventions/Progress Updates:   Patient performed home evaluation with PT and OT with focus on assessment of patient's ability to safely enter/exit home, navigate through home in w/c or ambulating with RW with min A and accessibility of each room, discuss home modifications to improve accessibility and safety with carpenter and perform transfers w/c <> bed, w/c <> couch and in/out of shower with RW.  Also focused on problem solving and sequencing in kitchen carrying items in RUE while propelling w/c to dining room table.  Patient required moderate-max verbal and visual cues to safely perform task.  Recommendations include adding rail to front entrance stairs, widening stair to garage to platform in order to use RW to enter/exit, rearrange garage to allow pt to ambulate with RW to passenger side, removing doors from bed room and bathroom entrance (from bedroom) and adding tension rod/curtain to widen doorway for w/c or RW, rearrange bedroom for increased space to navigate with RW or w/c, remove all area rugs that prevent efficient w/c mobility and cause falls risk during ambulation, rearrange dining room table to allow more efficient w/c propulsion/positioning in dining room and currently therapy is not recommending that pt attempt to perform laundry or cooking tasks without assistance.  Please see home eval sheet in shadow chart for full report.  Will discuss with family.  Therapy Documentation Precautions:  Precautions Precautions: Fall Precaution Comments: L neglect Restrictions Weight Bearing Restrictions: No Vital Signs: Therapy Vitals Temp: 98.2 F (36.8 C) Temp src: Oral Pulse Rate: 63  Resp: 18  BP: 120/72  mmHg Patient Position, if appropriate: Sitting Oxygen Therapy SpO2: 99 % Pain: Pain Assessment Pain Assessment: No/denies pain Locomotion : Ambulation Ambulation/Gait Assistance: 4: Min assist Wheelchair Mobility Distance: 50   See FIM for current functional status  Therapy/Group: Co-Treatment and Home evaluation  [name] Faucette 12/18/2011, 4:48 PM

## 2011-12-19 ENCOUNTER — Inpatient Hospital Stay (HOSPITAL_COMMUNITY): Payer: BC Managed Care – PPO | Admitting: Occupational Therapy

## 2011-12-19 ENCOUNTER — Inpatient Hospital Stay (HOSPITAL_COMMUNITY): Payer: BC Managed Care – PPO

## 2011-12-19 ENCOUNTER — Inpatient Hospital Stay (HOSPITAL_COMMUNITY): Payer: BC Managed Care – PPO | Admitting: Physical Therapy

## 2011-12-19 NOTE — Progress Notes (Signed)
Occupational Therapy Session Note  Patient Details  Name: Anthony Hardin MRN: [id number] Date of Birth: [birth year]  Today's Date: 12/19/2011 Time: 0730-0827 and 1330-1400 Time Calculation (min): 57 min and 30 min  Short Term Goals: Week 5:  OT Short Term Goal 1 (Week 5): Pt will complete LB dressing at sit to stand level with supervision  OT Short Term Goal 2 (Week 5): Pt will complete grooming in standing with close supervision OT Short Term Goal 3 (Week 5): Pt will complete walk-in shower transfer with min assist OT Short Term Goal 4 (Week 5): Pt will complete toilet transfer with supervision  Skilled Therapeutic Interventions/Progress Updates:    1) Pt seen for ADL retraining at walk-in shower with focus on functional ambulation with RW, sidestepping to simulate walk-in shower transfer at home, increased independence and safety with self-care tasks of bathing and dressing, and increased attention to LUE/LLE with mobility.  Min verbal cues given to increase safety and ease of transfer into walk-in shower.  Pt demonstrated increased awareness and problem solving with LB dressing by correcting mistake of donning wrong pant leg.  Pt required min cues for donning Lt shoe with AFO, requiring cues for positioning and sequencing to allow AFO to fit correctly.  2) 1:1 OT with focus on problem solving in kitchen with gathering items necessary to prepare a peanut butter and jelly sandwich.  Pt required min cues for w/c setup, suggested use of folding foot plate up instead of removing entire leg rest to simplify task.  Pt sit to stand to obtain peanut butter and plate from cabinets and navigated w/c around kitchen to position to access refrigerator.  Pt with questions regarding transporting hot plates from kitchen to dining table and discussed multiple options.    Therapy Documentation Precautions:  Precautions Precautions: Fall Precaution Comments: L neglect Restrictions Weight Bearing  Restrictions: No General:   Vital Signs: Therapy Vitals Temp: 98.1 F (36.7 C) Temp src: Oral Pulse Rate: 59  Resp: 18  BP: 118/78 mmHg Patient Position, if appropriate: Lying Oxygen Therapy SpO2: 97 % Pain:  Pt with no c/o pain this session.  See FIM for current functional status  Therapy/Group: Individual Therapy  Leonette Monarch 12/19/2011, 8:29 AM

## 2011-12-19 NOTE — Progress Notes (Signed)
Noted that after patient used the restroom at 12/18/11 2115, bright red blood found on toilet paper after wiping rectal area.  Scant blood noted.  Patient denies pain.  Will continue to monitor for any further bleeding.  No further bleeding from rectal area found at this time.  Barrie Lyme 3:57 AM 12/19/2011

## 2011-12-19 NOTE — Progress Notes (Signed)
Patient ID: Anthony Hardin, male   DOB: 01-17-1951, 61 y.o.   MRN: [id number] Admitted 10/31/2011 with diffuse weakness and unable to move and fell out of bed onto the floor. MRI of the brain showed a 4 x 1 x 2.5 cm acute hematoma right thalamus with mass effect and displacement distortion of the third ventricle but no suspicion of obstructive hydrocephalus. MRA of the head with no stenosis or occlusion.   Subjective/Complaints: Occ burning discomfort in L arm and Leg.  Patient does not feel that this is interfering with therapy thus far  Review of Systems  Musculoskeletal: Negative for myalgias.  Neurological: Positive for sensory change and focal weakness.  All other systems reviewed and are negative.  Frontal HA at end of day.  Some eye strain reported while trying to focus Objective: Vital Signs: Blood pressure 118/78, pulse 59, temperature 98.1 F (36.7 C), temperature source Oral, resp. rate 18, height 5\' 9"  (1.753 m), weight 70.3 kg (154 lb 15.7 oz), SpO2 97.00%. No results found. No results found for this or any previous visit (from the past 72 hour(s)).   HEENT: normal Cardio: RRR Resp: CTA B/L GI: BS positive Extremity:  No Edema Skin:   Intact Neuro: Confused, Abnormal Sensory absent sensation L side lt touch and proprio, Abnormal Motor 2/5 finger flex and arm ext, 2/5 hip/knee extensory synergy,0/5 L Ankle Abnormal FMC Reflexes 3+, Reflexes: 3+, Tone:  Hypertonia,  Visual fields intact to confrontation testing Musc/Skel:  Normal  Sensation absent L arm and leg Assessment/Plan: 1. Functional deficits secondary to R thalamic bleed which require 3+ hours per day of interdisciplinary therapy in a comprehensive inpatient rehab setting. Physiatrist is providing close team supervision and 24 hour management of active medical problems listed below. Physiatrist and rehab team continue to assess barriers to discharge/monitor patient progress toward functional and medical  goals. FIM: FIM - Bathing Bathing Steps Patient Completed: Chest;Right Arm;Left Arm;Abdomen;Front perineal area;Buttocks;Right upper leg;Left upper leg;Right lower leg (including foot);Left lower leg (including foot) Bathing: 5: Supervision: Safety issues/verbal cues  FIM - Upper Body Dressing/Undressing Upper body dressing/undressing steps patient completed: Thread/unthread right sleeve of pullover shirt/dresss;Thread/unthread left sleeve of pullover shirt/dress;Put head through opening of pull over shirt/dress;Pull shirt over trunk Upper body dressing/undressing: 5: Set-up assist to: Obtain clothing/put away FIM - Lower Body Dressing/Undressing Lower body dressing/undressing steps patient completed: Thread/unthread right underwear leg;Thread/unthread left underwear leg;Pull underwear up/down;Thread/unthread right pants leg;Thread/unthread left pants leg;Pull pants up/down;Don/Doff right shoe;Don/Doff left shoe;Fasten/unfasten right shoe Lower body dressing/undressing: 4: Min-Patient completed 75 plus % of tasks  FIM - Toileting Toileting steps completed by patient: Adjust clothing prior to toileting;Performs perineal hygiene;Adjust clothing after toileting Toileting Assistive Devices: Grab bar or rail for support Toileting: 5: Supervision: Safety issues/verbal cues  FIM - Diplomatic Services operational officer Devices: Grab bars Toilet Transfers: 5-To toilet/BSC: Supervision (verbal cues/safety issues);5-From toilet/BSC: Supervision (verbal cues/safety issues)  FIM - Press photographer Assistive Devices: Arm rests Bed/Chair Transfer: 5: Supine > Sit: Supervision (verbal cues/safety issues);5: Sit > Supine: Supervision (verbal cues/safety issues);4: Bed > Chair or W/C: Min A (steadying Pt. > 75%);4: Chair or W/C > Bed: Min A (steadying Pt. > 75%)  FIM - Locomotion: Wheelchair Distance: 50 Locomotion: Wheelchair: 2: Travels 50 - 149 ft with minimal assistance  (Pt.>75%) FIM - Locomotion: Ambulation Locomotion: Ambulation Assistive Devices: Walker - Rolling;Orthosis Ambulation/Gait Assistance: 4: Min assist Locomotion: Ambulation: 1: Travels less than 50 ft with minimal assistance (Pt.>75%)  Comprehension Comprehension Mode: Auditory  Comprehension: 6-Follows complex conversation/direction: With extra time/assistive device  Expression Expression Mode: Verbal Expression: 6-Expresses complex ideas: With extra time/assistive device  Social Interaction Social Interaction: 6-Interacts appropriately with others with medication or extra time (anti-anxiety, antidepressant).  Problem Solving Problem Solving: 5-Solves complex 90% of the time/cues < 10% of the time  Memory Memory: 6-More than reasonable amt of time  2. Anticoagulation/DVT prophylaxis with non-Pharmaceutical:  Medical Problem List and Plan:  1. 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 DVT Prophylaxis/Anticoagulation: Subcutaneous Lovenox initiated 10/31/2011. Monitor platelet counts and any signs of bleeding  3. Mood:Sleep-wake cycles have normalized off ritalin  4. Neuropsych: This patient is  capable of making decisions on his/her own behalf at this time.He has  poor awareness of deficits and grossly overestimates his physical and cognitive abilities  5. Hypertension. Norvasc 10 mg daily, clonidine patch 0.1 mg weekly, hydrochlorothiazide 25 mg daily, lisinopril 20 mg a.m. and 10 mg at bedtime. Monitor with increased mobility  6. Dysphagia. Mechanical soft diet thin liquids. Speech therapy followup. Monitor for any signs of aspiration.  7.  Sensory dysesthesia on L side. ? Recovery of sensation vs onset of thalamic pain syndrome, trial gabapentin at night, may need daytime dose if progressive  LOS (Days) 35 A FACE TO FACE EVALUATION WAS PERFORMED  KIRSTEINS,ANDREW E 12/19/2011, 9:05 AM

## 2011-12-19 NOTE — Progress Notes (Signed)
Speech Language Pathology Daily Session Note  Patient Details  Name: Anthony Hardin MRN: [id number] Date of Birth: [birth year]  Today's Date: 12/19/2011 Time: 0915-1000 Time Calculation (min): 45 min  Short Term Goals: Week 5: SLP Short Term Goal 1 (Week 5): Patient will alternate attention during a moderately complex functional task for 30 minutes with min assist verbal cues SLP Short Term Goal 2 (Week 5): Patient will demonstrate emergent awareness by asking for help when needed with supervision cues. SLP Short Term Goal 3 (Week 5): Patient will solve complex functional problems with modified independence.   Skilled Therapeutic Interventions: Treatment session targeted cognition.  Patient managed his scheduled medications with the use of a pill box with supervision verbal cues.  Patient also alternated attention during a card game with supervision cues in a distracting environment for 10 minutes.  SLP initiated discussion with patient about discharge planning.  Patient had questions regarding transportation and SLP educated patient about the need for clearance from MD to resume driving.     FIM:  Comprehension Comprehension Mode: Auditory Comprehension: 6-Follows complex conversation/direction: With extra time/assistive device Expression Expression Mode: Verbal Expression: 6-Expresses complex ideas: With extra time/assistive device Social Interaction Social Interaction: 6-Interacts appropriately with others with medication or extra time (anti-anxiety, antidepressant). Problem Solving Problem Solving: 5-Solves complex 90% of the time/cues < 10% of the time Memory Memory: 6-More than reasonable amt of time  Pain Pain Assessment Pain Assessment: No/denies pain  Therapy/Group: Individual Therapy  [name], Conrad [place]  Graduate Clinician Speech Language Pathology   Page, [name] 12/19/2011, 2:00 PM  The above skilled treatment note has been reviewed and SLP is in  agreement. [name], M.A., CCC-SLP [phone]

## 2011-12-19 NOTE — Progress Notes (Signed)
Physical Therapy Weekly Progress Note  Patient Details  Name: Anthony Hardin MRN: [id number] Date of Birth: 1950-03-26  Today's Date: 12/19/2011 Time: 1000-1059 Time Calculation (min): 59 min  Patient continues to make excellent progress and has met 2 of 10 long term goals.  Week 5 Short term goals not set due to estimated length of stay.  Patient is currently supervision bed mobility, supervision-min A overall for basic transfers and w/c mobility in controlled and home environment with continued verbal and questioning cues for safety awareness, attention to L and problem solving and sequencing, and min-mod A overall for gait with AFO and RW and stair negotiation.  Patient participated in home evaluation yesterday and home modification recommendations were given.  Patient will need to continue to focus on functional mobility in smaller spaces with w/c or RW, sequencing for transfers, and stair negotiation for safe home entry and exit.      Patient continues to demonstrate the following deficits: L hemiplegia with impaired motor control, timing, sequencing, apraxia, ataxia, impaired sensation and proprioception, impaired cognition and safety awareness, impaired postural control, dynamic balance, gait and therefore will continue to benefit from skilled PT intervention to enhance overall performance with balance, postural control, ability to compensate for deficits, functional use of  left upper extremity and left lower extremity, attention, awareness and coordination.  See Patient's Care Plan for progression toward long term goals.  Patient progressing toward long term goals..  Continue plan of care.  Skilled Therapeutic Interventions/Progress Updates:   Reviewed with patient challenges of home evaluation and areas to further address.  Performed w/c mobility on unit supervision.  Performed gait/stair training for both front and garage entrance with up and down 3 stairs with L rail x 3 reps with mod A  overall with max verbal and visual cues for hand placement on L rail for front entrance simulation and for placement of RW on and off of platform for garage simulation and verbal and visual cues for safe stepping sequence ascending with RLE, descending with LLE for safety. On final repetition had patient verbalize safe sequence.  Continued gait training with focus on L and R lateral stepping with RW through narrow entrance (23") to mimic bed/bathroom doorway and problem solving for direction of entry if going to sink vs. Toilet; performed with min A overall.    Performed multiple sit <> stands from w/c with supervision with verbal cues to push up with LUE; removed hand orthosis from RW secondary to improved grip and to minimize # of steps for safe set up during gait.  Discussed again with patient when family should begin family education and training and discussed equipment needs for D/C and f/u therapy options. Patient to begin with HHPT/OT to continue to work on safe functional mobility at home prior to outpatient. Patient verbalized understanding.  Therapy Documentation Precautions:  Precautions Precautions: Fall Precaution Comments: L neglect Restrictions Weight Bearing Restrictions: No Vital Signs: Therapy Vitals Temp: 98.1 F (36.7 C) Temp src: Oral Pulse Rate: 59  Resp: 18  BP: 118/78 mmHg Patient Position, if appropriate: Lying Oxygen Therapy SpO2: 97 % Pain:  No c/o pain  See FIM for current functional status  Therapy/Group: Individual Therapy  [name] [facility] 12/19/2011, 8:42 AM

## 2011-12-20 ENCOUNTER — Inpatient Hospital Stay (HOSPITAL_COMMUNITY): Payer: BC Managed Care – PPO | Admitting: Physical Therapy

## 2011-12-20 ENCOUNTER — Inpatient Hospital Stay (HOSPITAL_COMMUNITY): Payer: BC Managed Care – PPO | Admitting: Occupational Therapy

## 2011-12-20 NOTE — Progress Notes (Signed)
Nutrition Follow-up  Intervention:   1. Offer appropriate alternatives it pt refuses meals 2. RD to continue to follow nutrition care plan  Assessment:   Pt continues to eat very well, consuming 90-100% of meals. Noted weight stabilizing.  Diet Order:  Regular Supplement: Resource Breeze PO daily  Meds: Scheduled Meds:    . amLODipine  10 mg Oral Daily  . cloNIDine  0.1 mg Transdermal Weekly  . enoxaparin (LOVENOX) injection  40 mg Subcutaneous Q24H  . feeding supplement  1 Container Oral Q24H  . gabapentin  100 mg Oral QHS  . hydrochlorothiazide  25 mg Oral Daily  . lisinopril  10 mg Oral QHS  . lisinopril  20 mg Oral Daily  . multivitamin with minerals  1 tablet Oral Daily  . potassium chloride  10 mEq Oral Daily  . senna-docusate  1 tablet Oral BID   Continuous Infusions:  PRN Meds:.acetaminophen, bisacodyl, ondansetron (ZOFRAN) IV, ondansetron, polyethylene glycol, sorbitol  Labs:  CMP     Component Value Date/Time   NA 135 11/15/2011 0625   K 3.9 11/15/2011 0625   CL 96 11/15/2011 0625   CO2 28 11/15/2011 0625   GLUCOSE 103* 11/15/2011 0625   BUN 30* 11/15/2011 0625   CREATININE 1.19 11/15/2011 0625   CALCIUM 10.4 11/15/2011 0625   PROT 7.0 11/15/2011 0625   ALBUMIN 3.5 11/15/2011 0625   AST 18 11/15/2011 0625   ALT 38 11/15/2011 0625   ALKPHOS 58 11/15/2011 0625   BILITOT 0.5 11/15/2011 0625   GFRNONAA 65* 11/15/2011 0625   GFRAA 76* 11/15/2011 0625     Intake/Output Summary (Last 24 hours) at 12/20/11 0913 Last data filed at 12/19/11 2100  Gross per 24 hour  Intake    480 ml  Output      0 ml  Net    480 ml  BM 10/7  Weight Status:  154 lb - stable  Estimated needs:  1900 - 2000 kcal, 90 - 105 grams protein  Nutrition Dx:  Inadequate oral intake r/t decreased alertness AEB poor meal completion (<25%.) Resolved.  Goal: Pt to meet >/= 90% of their estimated nutrition needs;  met  Monitor: weight trends, lab trends, I/O's, PO intake, supplement tolerance  Anthony Motto MS,  RD, LDN Pager: [phone] After-hours pager: [phone]

## 2011-12-20 NOTE — Progress Notes (Signed)
Physical Therapy Session Note  Patient Details  Name: Anthony Hardin MRN: [id number] Date of Birth: [birth year]  Today's Date: 12/20/2011 Time: 0900-1000 Time Calculation (min): 60 min  Short Term Goals: Week 3:  PT Short Term Goal 1 (Week 3): = LTG  Skilled Therapeutic Interventions/Progress Updates:   W/c on unit supervision; sit <> stand with supervision; reviewed stair negotiation sequence again; patient with improved carry over and able to verbalize safe sequence with LUE support to ascend; performed up and down 3 steps with L rail x 3 reps with min A and still presents with increased anxiety about ascending with RLE secondary to lack of proprioception or awareness of LUE and LE positioning during ascending.  Placed mirror in front of patient and had patient perform 10 reps RLE step ups with use of mirror as visual feedback for LLE and LUE position with min A and multiple verbal and tactile cues.  Discussed use of bilat UE support on one rail and ascending with step to technique laterally with min A and patient reporting increased stability and confidence with stair negotiation.  Gait on unit x 100' with RW and min A with decreased cues needed for LLE full advancement.    Performed bilat UE and LE ROM, strengthening, endurance and coordination training and trunk rotation training on Nustep at level 6 x 12 minutes at RPE 13.      Therapy Documentation Precautions:  Precautions Precautions: Fall Precaution Comments: Lt neglect Restrictions Weight Bearing Restrictions: No Vital Signs: Therapy Vitals Pulse Rate: 68  BP: 113/72 mmHg Pain: Pain Assessment Pain Assessment: No/denies pain Locomotion : Ambulation Ambulation/Gait Assistance: 4: Min guard Wheelchair Mobility Distance: 150   See FIM for current functional status  Therapy/Group: Individual Therapy  [name] [facility] 12/20/2011, 10:44 AM

## 2011-12-20 NOTE — Progress Notes (Signed)
Social Work Patient ID: Anthony Hardin, male   DOB: Apr 23, 1950, 61 y.o.   MRN: [id number] Met with pt and spoke with sister-Jenny via telephone to inform team conference progression toward his goals and discharge 10/15. Brother in-law-Dave is coming tomorrow and will come in for therapy Friday.  He will be with him for a couple weeks and then have a hired Engineer, structural then his nephew come.  Will order pt's DME and follow up therapies.  Both pleased with his progress and preparing for discharge.

## 2011-12-20 NOTE — Patient Care Conference (Signed)
Inpatient RehabilitationTeam Conference Note Date: 12/20/2011   Time: 10:30 AM    Patient Name: Anthony Hardin      Medical Record Number: [id number]  Date of Birth: 23-Nov-1950 Sex: Male         Room/Bed: 4025/4025-01 Payor Info: Payor: BLUE CROSS BLUE SHIELD  Plan: [facility] HEALTH PPO  Product Type: *No Product type*     Admitting Diagnosis: RT HEMM   Admit Date/Time:  11/14/2011  5:35 PM Admission Comments: No comment available   Primary Diagnosis:  Thalamic hemorrhage Principal Problem: Thalamic hemorrhage  Patient Active Problem List   Diagnosis Date Noted  . 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 Intracerebral hemorrhage 10/31/2011  . 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 Hemiplegia, unspecified, affecting nondominant side 10/31/2011    Expected Discharge Date: Expected Discharge Date: 12/26/11  Team Members Present: Physician: Dr. Claudette Laws Social Worker Present: Dossie Der, LCSW Nurse Present: Other (comment) Morrie Sheldon Royal-RN) PT Present: [name], Lillie Columbia, PT OT Present: Bretta Bang, Verlene Mayer, OT SLP Present: [name], SLP Other (Discipline and Name): Charolette Child Coordinator     Current Status/Progress Goal Weekly Team Focus  Medical   BP well controlled, continent of Bowel and Bladder  Minimize meds prior to D/C  Pt and caregiver ed   Bowel/Bladder   Continent of bowel and bladder. LBM 12/19/11  Continent of bowel and bladder  Monitor   Swallow/Nutrition/ Hydration             ADL's   supervision bathing and dressing except min assist LB dressing (Lt shoe/AFO), supervision assist grooming in standing, min assist transfers with RW to shower and toilet  supervision overall  continued attention to LUE and increased demand of LUE, transfers with RW as pertaining to self-care tasks   Mobility   supervision basic transfers, w/c mobility; min A gait and stairs  supervision-min A overall   D/C planning, family education, gait     Communication             Safety/Cognition/ Behavioral Observations  Supervision   Mod I-supervision   increase self monitoring and correcting with complex tasks, complete family education for discharge planning   Pain   No c/o pain  <3  Monitor    Skin   CDI  CDI  Assess skin q shift      *See Interdisciplinary Assessment and Plan and progress notes for long and short-term goals  Barriers to Discharge: need to establish caregiver     Possible Resolutions to Barriers:  SW to address    Discharge Planning/Teaching Needs:  Home eval went well.  Need to get caregiver in to begin family training.      Team Discussion:  Pt continues to progress. Brother in-law will be here Thursday and can come in Friday for family education. Speech to discharge once family education complete.  Revisions to Treatment Plan:  None   Continued Need for Acute Rehabilitation Level of Care: The patient requires daily medical management by a physician with specialized training in physical medicine and rehabilitation for the following conditions: Daily direction of a multidisciplinary physical rehabilitation program to ensure safe treatment while eliciting the highest outcome that is of practical value to the patient.: Yes Daily medical management of patient stability for increased activity during participation in an intensive rehabilitation regime.: Yes Daily analysis of laboratory values and/or radiology reports with any subsequent need for medication adjustment of medical intervention for : Neurological problems  Lennette Fader, [name] 12/20/2011, 1:15 PM

## 2011-12-20 NOTE — Progress Notes (Signed)
I have read and agree with the following treatment session.  Yeslin Delio Hall, PT, DPT 

## 2011-12-20 NOTE — Progress Notes (Signed)
Patient ID: Anthony Hardin, male   DOB: Sep 28, 1950, 61 y.o.   MRN: [id number] Admitted 10/31/2011 with diffuse weakness and unable to move and fell out of bed onto the floor. MRI of the brain showed a 4 x 1 x 2.5 cm acute hematoma right thalamus with mass effect and displacement distortion of the third ventricle but no suspicion of obstructive hydrocephalus. MRA of the head with no stenosis or occlusion.   Subjective/Complaints: Becoming more scared of movements such as steps  Review of Systems  Musculoskeletal: Negative for myalgias.  Neurological: Positive for sensory change and focal weakness.  All other systems reviewed and are negative.   Objective: Vital Signs: Blood pressure 113/72, pulse 68, temperature 98.2 F (36.8 C), temperature source Oral, resp. rate 16, height 5\' 9"  (1.753 m), weight 70.3 kg (154 lb 15.7 oz), SpO2 100.00%. No results found. No results found for this or any previous visit (from the past 72 hour(s)).   HEENT: normal Cardio: RRR Resp: CTA B/L GI: BS positive Extremity:  No Edema Skin:   Intact Neuro: Confused, Abnormal Sensory absent sensation L side lt touch and proprio, Abnormal Motor 2/5 finger flex and arm ext, 3/5 hip/knee extensory synergy,0/5 L Ankle Abnormal FMC Reflexes 3+, Reflexes: 3+, Tone:  Hypertonia,  Visual fields intact to confrontation testing Musc/Skel:  Normal  Sensation absent L arm and leg Assessment/Plan: 1. Functional deficits secondary to R thalamic bleed which require 3+ hours per day of interdisciplinary therapy in a comprehensive inpatient rehab setting. Physiatrist is providing close team supervision and 24 hour management of active medical problems listed below. Physiatrist and rehab team continue to assess barriers to discharge/monitor patient progress toward functional and medical goals. FIM: FIM - Bathing Bathing Steps Patient Completed: Chest;Right Arm;Left Arm;Front perineal area;Abdomen;Buttocks;Right upper leg;Left  upper leg;Right lower leg (including foot);Left lower leg (including foot) Bathing: 5: Supervision: Safety issues/verbal cues  FIM - Upper Body Dressing/Undressing Upper body dressing/undressing steps patient completed: Thread/unthread right sleeve of pullover shirt/dresss;Thread/unthread left sleeve of pullover shirt/dress;Put head through opening of pull over shirt/dress;Pull shirt over trunk Upper body dressing/undressing: 5: Supervision: Safety issues/verbal cues FIM - Lower Body Dressing/Undressing Lower body dressing/undressing steps patient completed: Thread/unthread right underwear leg;Thread/unthread left underwear leg;Pull underwear up/down;Thread/unthread right pants leg;Thread/unthread left pants leg;Pull pants up/down;Don/Doff right shoe;Don/Doff left shoe;Fasten/unfasten right shoe Lower body dressing/undressing: 4: Min-Patient completed 75 plus % of tasks  FIM - Toileting Toileting steps completed by patient: Adjust clothing prior to toileting;Performs perineal hygiene;Adjust clothing after toileting Toileting Assistive Devices: Grab bar or rail for support Toileting: 5: Supervision: Safety issues/verbal cues  FIM - Diplomatic Services operational officer Devices: Grab bars Toilet Transfers: 4-To toilet/BSC: Min A (steadying Pt. > 75%);4-From toilet/BSC: Min A (steadying Pt. > 75%)  FIM - Bed/Chair Transfer Bed/Chair Transfer Assistive Devices: Arm rests Bed/Chair Transfer: 5: Bed > Chair or W/C: Supervision (verbal cues/safety issues);5: Chair or W/C > Bed: Supervision (verbal cues/safety issues)  FIM - Locomotion: Wheelchair Distance: 150 Locomotion: Wheelchair: 5: Travels 150 ft or more: maneuvers on rugs and over door sills with supervision, cueing or coaxing FIM - Locomotion: Ambulation Locomotion: Ambulation Assistive Devices: Walker - Rolling;Orthosis Ambulation/Gait Assistance: 4: Min guard Locomotion: Ambulation: 2: Travels 50 - 149 ft with minimal assistance  (Pt.>75%)  Comprehension Comprehension Mode: Auditory Comprehension: 6-Follows complex conversation/direction: With extra time/assistive device  Expression Expression Mode: Verbal Expression: 5-Expresses basic 90% of the time/requires cueing < 10% of the time.  Social Interaction Social Interaction: 6-Interacts appropriately with others  with medication or extra time (anti-anxiety, antidepressant).  Problem Solving Problem Solving: 5-Solves basic 90% of the time/requires cueing < 10% of the time  Memory Memory: 6-More than reasonable amt of time  2. Anticoagulation/DVT prophylaxis with non-Pharmaceutical:  Medical Problem List and Plan:  1. 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 DVT Prophylaxis/Anticoagulation: Subcutaneous Lovenox initiated 10/31/2011. Monitor platelet counts and any signs of bleeding  3. Mood:Sleep-wake cycles have normalized off ritalin  4. Neuropsych: This patient is  capable of making decisions on his/her own behalf at this time.He has  poor awareness of deficits and grossly overestimates his physical and cognitive abilities  5. Hypertension. controlled Norvasc 10 mg daily, clonidine patch 0.1 mg weekly, hydrochlorothiazide 25 mg daily, lisinopril 20 mg a.m. and 10 mg at bedtime. Monitor with increased mobility  6. Dysphagia. Mechanical soft diet thin liquids. Speech therapy followup. Monitor for any signs of aspiration.  7.  Sensory dysesthesia on L side. ? Recovery of sensation vs onset of thalamic pain syndrome, trial gabapentin at night, may need daytime dose if progressive  LOS (Days) 36 A FACE TO FACE EVALUATION WAS PERFORMED  Constantina Laseter E 12/20/2011, 10:03 AM

## 2011-12-20 NOTE — Progress Notes (Signed)
Physical Therapy Session Note  Patient Details  Name: Anthony Hardin MRN: [id number] Date of Birth: 12-05-1950  Today's Date: 12/20/2011 Time: 1130-1200; 2725-3664 Time Calculation (min): 30 min; 45 min  Short Term Goals: Week 3:  PT Short Term Goal 1 (Week 3): = LTG     Therapy Documentation Precautions:  Precautions Precautions: Fall Precaution Comments: Lt neglect Restrictions Weight Bearing Restrictions: No    Pain: Pain Assessment Pain Assessment: No/denies pain  Mobility: Patient performed stand pivot transfer to L from w/c > mat with min guard and vcs for L hand placement.    Locomotion : Ambulation Ambulation/Gait Assistance: HHA/min assist Patient ambulated 25 feet at end of session with emphasis on controlling genu recurvatum on L during stance phase. Patient able to do so fairly well with intermittent snapping of L knee. Patient able to verbalize when motion occurred and self corrected.  PM session: Patient performed w/c mobility 150 feet using R hemi technique with supervision.  Patient ambulated 100 feet with RW and min guard without AFO with emphasis on heel strike and knee control. Patient able to control knee very well initially, but did present with genu recurvatum when fatigued.  Patient ascended/descended 5 steps sideward with min guard and bilateral UE support, with emphasis on pushing through LLE (for strengthening) and L knee control. Patient presented with good L knee control when ascending with LLE, but presented with increased difficulty descending with RLE, resulting in buckling at L knee.     Exercises: Patient performed w/c scoots x 75 feet with LLE with emphasis on motor control and hamstring strengthening. Patient presented with increased difficulty achieving hip flexion to extend knee, however able to correct with verbal cueing. Patient performed eccentric step downs with LLE, bilateral UE support and min guard with emphasis on controlling  ascent/descent to prevent L knee genu recurvatum/buckling. Patient performed 5 reps x 2 sets, requiring sitting rest break between.   PM session: Patient performed heel slides on LLE using pillowcase in sitting x 20 reps for hamstring strengthening.  L single leg bridging x 10 reps with 4 sec hold for hip extensor and hamstring strengthening. Patient required vcs to keep L leg from abducting.  L heel slides in supine x 10 reps with emphasis on hamstring strengthening and controlling knee extension.   Other Treatments:    See FIM for current functional status  Therapy/Group: Individual Therapy  Nayeli Calvert [place] DPT Student  12/20/2011, 12:06 PM

## 2011-12-20 NOTE — Progress Notes (Signed)
Occupational Therapy Weekly Progress Note and Treatment Session Note  Patient Details  Name: Anthony Hardin MRN: [id number] Date of Birth: May 02, 1950  Today's Date: 12/20/2011 Time: 0730-0825 Time Calculation (min): 55 min  Patient has met 2 of 4 short term goals.  Pt is at supervision level overall with bathing and dressing, except requiring min assist with donning Lt shoe with AFO and shoe button.  Pt requires min assist with sidestepping and transfers with RW with min cues for attention to LLE and advancement with steps.  Pt is demonstrating increased attention to LUE and functional use with self-care tasks, however continues to have sensation and proprioception deficits.  Patient continues to demonstrate the following deficits: Lt hemiparesis, impaired motor control, impaired sequencing and timing, impaired sensation and proprioception, impaired cognition and safety awareness and problem solving and therefore will continue to benefit from skilled OT intervention to enhance overall performance with BADL, iADL and Reduce care partner burden.  Patient progressing toward long term goals..  Continue plan of care.  OT Short Term Goals Week 5:  OT Short Term Goal 1 (Week 5): Pt will complete LB dressing at sit to stand level with supervision  OT Short Term Goal 1 - Progress (Week 5): Progressing toward goal OT Short Term Goal 2 (Week 5): Pt will complete grooming in standing with close supervision OT Short Term Goal 2 - Progress (Week 5): Met OT Short Term Goal 3 (Week 5): Pt will complete walk-in shower transfer with min assist OT Short Term Goal 3 - Progress (Week 5): Met OT Short Term Goal 4 (Week 5): Pt will complete toilet transfer with supervision OT Short Term Goal 4 - Progress (Week 5): Progressing toward goal Week 6:  OT Short Term Goal 1 (Week 6): Pt will complete LB dressing at sit to stand level with supervision  OT Short Term Goal 2 (Week 6): Pt will complete toilet transfer with  supervision  OT Short Term Goal 3 (Week 6): Pt will complete walk-in shower transfer with supervision OT Short Term Goal 4 (Week 6): Pt will complete simple meal prep with supervision  Skilled Therapeutic Interventions/Progress Updates:    Pt seen for ADL retraining at walk-in shower with focus on functional ambulation with RW, sidestepping to simulate walk-in shower transfer at home, increased independence and safety with self-care tasks of bathing and dressing, and increased attention to LUE/LLE with mobility. Min verbal cues given to increase safety and ease of transfer into walk-in shower. Pt distant supervision with bathing, with no verbal cues required for sequencing or safety.  Pt demonstrated increased awareness and problem solving with donning Lt shoe with AFO, requiring cues for positioning and sequencing to allow AFO to fit correctly.   Therapy Documentation Precautions:  Precautions Precautions: Fall Precaution Comments: Lt neglect Restrictions Weight Bearing Restrictions: No General:   Vital Signs: Therapy Vitals Temp: 98.2 F (36.8 C) Temp src: Oral Pulse Rate: 57  Resp: 16  BP: 111/69 mmHg Patient Position, if appropriate: Lying Oxygen Therapy SpO2: 100 % O2 Device: None (Room air) Pulse Oximetry Type: Intermittent Pain: Pain Assessment Pain Assessment: No/denies pain ADL: ADL Eating: Independent Where Assessed-Eating: Wheelchair Grooming: Modified independent Where Assessed-Grooming: Sitting at sink;Standing at sink Upper Body Bathing: Supervision/safety Where Assessed-Upper Body Bathing: Shower Lower Body Bathing: Supervision/safety Where Assessed-Lower Body Bathing: Shower Upper Body Dressing: Supervision/safety Where Assessed-Upper Body Dressing: Sitting at sink Lower Body Dressing: Minimal assistance;Minimal cueing Where Assessed-Lower Body Dressing: Sitting at sink;Standing at sink Toileting: Supervision/safety Where Assessed-Toileting:  Teacher, adult education: Curator Method: Ambulating (with RW) Acupuncturist: Engineer, technical sales: Not assessed Film/video editor: Insurance underwriter Method: Designer, industrial/product: Transfer tub bench;Grab bars ADL Comments: Pt attending to LUE more in functional tasks, continues to have extreme sensory loss. Pt with increased initiation and spontaneous use of LUE in functional self-care tasks. Pt with increased attention to Lt side of body with bathing and grooming tasks.  Increased focus on sidestepping with transfers as bathroom at home is very narrow and will require sidesteps to get to toilet and walk-in shower.  See FIM for current functional status  Therapy/Group: Individual Therapy  Leonette Monarch 12/20/2011, 8:26 AM

## 2011-12-21 ENCOUNTER — Inpatient Hospital Stay (HOSPITAL_COMMUNITY): Payer: BC Managed Care – PPO | Admitting: Occupational Therapy

## 2011-12-21 ENCOUNTER — Encounter (HOSPITAL_COMMUNITY): Payer: BC Managed Care – PPO | Admitting: Occupational Therapy

## 2011-12-21 ENCOUNTER — Inpatient Hospital Stay (HOSPITAL_COMMUNITY): Payer: BC Managed Care – PPO | Admitting: Physical Therapy

## 2011-12-21 ENCOUNTER — Inpatient Hospital Stay (HOSPITAL_COMMUNITY): Payer: BC Managed Care – PPO | Admitting: Speech Pathology

## 2011-12-21 NOTE — Progress Notes (Signed)
Physical Therapy Session Note  Patient Details  Name: Anthony Hardin MRN: [id number] Date of Birth: [birth year]  Today's Date: 12/21/2011 Time: 0830-0930 Time Calculation (min): 60 min  Short Term Goals: Week 3:  PT Short Term Goal 1 (Week 3): = LTG     Therapy Documentation Precautions:  Precautions Precautions: Fall Precaution Comments: Lt neglect Restrictions Weight Bearing Restrictions: No   Pain: Pain Assessment Pain Assessment: No/denies pain   Locomotion : Ambulation Ambulation: Yes Ambulation/Gait Assistance: 4: Min guard Ambulation Distance (Feet): 150 Feet  Patient ambulated 150 feet from room > PT gym with RW and supervision/min guard with emphasis on knee control and L foot clearance. Patient continues to present with intermittent L foot drag in which he was able to self correct with little to no vcs. Patient performed side stepping bilaterally with RW and min guard x 15 feet x 2 sets. Patient required initial vcs for sequencing. Simulation of patient's bathroom was set up and patient performed side stepping throughout. Patient presented with increased difficulty stepping over step laterally (simulating entrance into standing shower). Required vcs to assist with problem solving when performing.  Patient ascended/descended three steps with LUE support using L handrail and min assist. Patient presented with increased concern initially about performeing steps forward, but able to complete fairly well requiring increased time for L foot clearance when ascending.     See FIM for current functional status  Therapy/Group: Individual Therapy  Gabrial Poppell [place] DPT Student  12/21/2011, 12:20 PM

## 2011-12-21 NOTE — Progress Notes (Signed)
Occupational Therapy Session Note  Patient Details  Name: Anthony Hardin MRN: [id number] Date of Birth: 1950-06-11  Today's Date: 12/21/2011 Time: 1002-1056 Time Calculation (min): 54 min  SESSION 2: Time: 1330-1415 Time Calculation(min):  45 min  Short Term Goals: Week 6:  OT Short Term Goal 1 (Week 6): Pt will complete LB dressing at sit to stand level with supervision  OT Short Term Goal 2 (Week 6): Pt will complete toilet transfer with supervision  OT Short Term Goal 3 (Week 6): Pt will complete walk-in shower transfer with supervision OT Short Term Goal 4 (Week 6): Pt will complete simple meal prep with supervision  Skilled Therapeutic Interventions/Progress Updates:    Session 1:  Worked on bathing and dressing sit to stand.  Used RW for transfer to and from the shower seat for bathing.  Pt distracted with conversation during bathing and noted to be washing his left arm and body multiple times, so needed min instructional cues to sequence.  Performed all dressing with increased time to orient clothing but overall supervision.  Was also able to donn his AFO on the left foot as well.  Performed grooming activity of shaving in standing with close supervision and use of the LUE for 25 % of task.  Encouraged pt to attempt LUE functional use with more activities including sit to stand and locking his wheelchair brakes.   Session 2:  Worked on therapeutic activity with focus on standing balance with weight shifts in static standing while engaging in active use of the LUE.  Pt able to hold onto WII controller with the LUE and move his left arm to correspond with the activity on the television.  Pt needs assistance to shift weight on the LLE in standing while engaging in the activity.  Improved with LUE functional movement including timing and coordination as the session progressed.  Able to maintain standing for at least 10 mins before needing a rest break.   Therapy  Documentation Precautions:  Precautions Precautions: Fall Precaution Comments: Lt neglect Restrictions Weight Bearing Restrictions: No  Pain: Pain Assessment Pain Assessment: No/denies pain ADL: See FIM for current functional status  Therapy/Group: Individual Therapy  Ariyona Eid OTR/L 12/21/2011, 3:28 PM

## 2011-12-21 NOTE — Progress Notes (Signed)
Speech Language Pathology Daily Session Note and Discharge Summary  Patient Details  Name: Anthony Hardin MRN: [id number] Date of Birth: 1950-12-22  Today's Date: 12/21/2011 Time: 14:30-15:25 Time Calculation (min): 55 min  Short Term Goals: Week 5: SLP Short Term Goal 1 (Week 5): Patient will alternate attention during a moderately complex functional task for 30 minutes with min assist verbal cues SLP Short Term Goal 2 (Week 5): Patient will demonstrate emergent awareness by asking for help when needed with supervision cues. SLP Short Term Goal 3 (Week 5): Patient will solve complex functional problems with modified independence.   Skilled Therapeutic Interventions: SLP initiated education with patient and family about progress during stay on CIR, f/u recommendations, and cognitive strategies to facilitate increased functional independence.  Patient demonstrated anticipatory awareness by identifying deficits in left sided attention and planning for future problems that may occur as a result of deficits.   Brother-in-law verbalized understanding of the importance of the following strategies to the patient's cognitive rehab: reducing environmental distractions during demanding tasks, supervising patient during complex problem solving tasks such as medication and financial management, and giving choices of solutions to problems if patient has difficulty.  No further speech therapy recommended at the CIR level at this time.      FIM:  Comprehension Comprehension Mode: Auditory Comprehension: 6-Follows complex conversation/direction: With extra time/assistive device Expression Expression Mode: Verbal Expression: 6-Expresses complex ideas: With extra time/assistive device Social Interaction Social Interaction: 6-Interacts appropriately with others with medication or extra time (anti-anxiety, antidepressant). Problem Solving Problem Solving: 5-Solves complex 90% of the time/cues < 10% of the  time Memory Memory: 6-More than reasonable amt of time  Pain Pain Assessment Pain Assessment: No/denies pain  Therapy/Group: Individual Therapy  PageJoni Reining 12/21/2011, 3:58 PM  Speech Language Pathology Discharge Summary  Patient Details  Name: Anthony Hardin MRN: [id number] Date of Birth: 1950-06-25  Today's Date: 12/21/2011  Patient has met 4 of 4 long term goals.  Patient to discharge at overall Supervision level.  Reasons goals not met:   n/a  Clinical Impression/Discharge Summary: Patient met 4 out of 4 long term goals during stay on CIR.  Patient exhibited marked gains in attention, complex problem solving, and awareness.  Patient managed medications and finances with supervision verbal cues.  Patient demonstrated anticipatory awareness by identifying   deficits in left attention and initiated compensations for future problems as a result of deficits.  Patient demonstrates alternating and divided attention during basic and complex functional tasks with supervision level verbal cues.  Patient benefits from reducing environmental distractions with more complex tasks.  Patient will continue to benefit from home health speech services and transition to outpatient rehab to focus on reintegration into the community in order to maximize functional independence and reduce burden of care upon discharge.    Care Partner:  Caregiver Able to Provide Assistance: Yes  Type of Caregiver Assistance: Physical;Cognitive  Recommendation:  Home Health SLP;Outpatient SLP;24 hour supervision/assistance  Rationale for SLP Follow Up: Maximize cognitive function and independence;Reduce caregiver burden   Equipment:   n/a  Reasons for discharge: Discharged from hospital   Patient/Family Agrees with Progress Made and Goals Achieved: Yes   See FIM for current functional status  [name], [place] [facility] [place]  Graduate Clinician Speech Language Pathology  Page, [name] 12/21/2011, 4:23 PM  The above  skilled treatment note has been reviewed and SLP is in agreement. [name], M.A., CCC-SLP [phone]

## 2011-12-21 NOTE — Progress Notes (Signed)
I agree with this assessment 

## 2011-12-21 NOTE — Progress Notes (Signed)
Patient ID: Anthony Hardin, male   DOB: 01/23/1951, 61 y.o.   MRN: [id number] Admitted 10/31/2011 with diffuse weakness and unable to move and fell out of bed onto the floor. MRI of the brain showed a 4 x 1 x 2.5 cm acute hematoma right thalamus with mass effect and displacement distortion of the third ventricle but no suspicion of obstructive hydrocephalus. MRA of the head with no stenosis or occlusion.   Subjective/Complaints: No dizziness, discussed BP which is well controlled and HR which is at baseline  Review of Systems  Musculoskeletal: Negative for myalgias.  Neurological: Positive for sensory change and focal weakness.  All other systems reviewed and are negative.   Objective: Vital Signs: Blood pressure 116/67, pulse 56, temperature 98.3 F (36.8 C), temperature source Oral, resp. rate 19, height 5\' 9"  (1.753 m), weight 73.573 kg (162 lb 3.2 oz), SpO2 99.00%. No results found. No results found for this or any previous visit (from the past 72 hour(s)).   HEENT: normal Cardio: RRR Resp: CTA B/L GI: BS positive Extremity:  No Edema Skin:   Intact Neuro: Confused, Abnormal Sensory absent sensation L side lt touch and proprio, Abnormal Motor 2/5 finger flex and arm ext, 3/5 hip/knee extensory synergy,0/5 L Ankle Abnormal FMC Reflexes 3+, Reflexes: 3+, Tone:  Hypertonia,  Visual fields intact to confrontation testing Musc/Skel:  Normal  Sensation absent L arm and leg Assessment/Plan: 1. Functional deficits secondary to R thalamic bleed which require 3+ hours per day of interdisciplinary therapy in a comprehensive inpatient rehab setting. Physiatrist is providing close team supervision and 24 hour management of active medical problems listed below. Physiatrist and rehab team continue to assess barriers to discharge/monitor patient progress toward functional and medical goals. FIM: FIM - Bathing Bathing Steps Patient Completed: Chest;Right Arm;Left Arm;Front perineal  area;Abdomen;Buttocks;Right upper leg;Left upper leg;Right lower leg (including foot);Left lower leg (including foot) Bathing: 5: Supervision: Safety issues/verbal cues  FIM - Upper Body Dressing/Undressing Upper body dressing/undressing steps patient completed: Thread/unthread right sleeve of pullover shirt/dresss;Thread/unthread left sleeve of pullover shirt/dress;Put head through opening of pull over shirt/dress;Pull shirt over trunk Upper body dressing/undressing: 5: Supervision: Safety issues/verbal cues FIM - Lower Body Dressing/Undressing Lower body dressing/undressing steps patient completed: Thread/unthread right underwear leg;Thread/unthread left underwear leg;Pull underwear up/down;Thread/unthread right pants leg;Thread/unthread left pants leg;Pull pants up/down;Don/Doff right shoe;Don/Doff left shoe;Fasten/unfasten right shoe Lower body dressing/undressing: 4: Min-Patient completed 75 plus % of tasks  FIM - Toileting Toileting steps completed by patient: Adjust clothing after toileting;Performs perineal hygiene;Adjust clothing prior to toileting Toileting Assistive Devices: Grab bar or rail for support Toileting: 5: Supervision: Safety issues/verbal cues  FIM - Diplomatic Services operational officer Devices: Grab bars Toilet Transfers: 4-To toilet/BSC: Min A (steadying Pt. > 75%);4-From toilet/BSC: Min A (steadying Pt. > 75%)  FIM - Bed/Chair Transfer Bed/Chair Transfer Assistive Devices: Arm rests Bed/Chair Transfer: 5: Bed > Chair or W/C: Supervision (verbal cues/safety issues);5: Chair or W/C > Bed: Supervision (verbal cues/safety issues)  FIM - Locomotion: Wheelchair Distance: 150 Locomotion: Wheelchair: 5: Travels 150 ft or more: maneuvers on rugs and over door sills with supervision, cueing or coaxing FIM - Locomotion: Ambulation Locomotion: Ambulation Assistive Devices: Designer, industrial/product Ambulation/Gait Assistance: 4: Min guard Locomotion: Ambulation: 4: Travels  150 ft or more with minimal assistance (Pt.>75%)  Comprehension Comprehension Mode: Auditory Comprehension: 6-Follows complex conversation/direction: With extra time/assistive device  Expression Expression Mode: Verbal Expression: 5-Expresses basic needs/ideas: With extra time/assistive device  Social Interaction Social Interaction: 6-Interacts appropriately with  others with medication or extra time (anti-anxiety, antidepressant).  Problem Solving Problem Solving: 5-Solves basic 90% of the time/requires cueing < 10% of the time  Memory Memory: 6-More than reasonable amt of time  2. Anticoagulation/DVT prophylaxis with non-Pharmaceutical:  Medical Problem List and Plan:  1. 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 DVT Prophylaxis/Anticoagulation: Subcutaneous Lovenox initiated 10/31/2011. Monitor platelet counts and any signs of bleeding  3. Mood:Sleep-wake cycles have normalized off ritalin  4. Neuropsych: This patient is  capable of making decisions on his/her own behalf at this time.He has  poor awareness of deficits and grossly overestimates his physical and cognitive abilities  5. Hypertension. controlled Norvasc 10 mg daily, clonidine patch 0.1 mg weekly, hydrochlorothiazide 25 mg daily, lisinopril 20 mg a.m. and 10 mg at bedtime. Monitor with increased mobility  6. Dysphagia. Mechanical soft diet thin liquids. Speech therapy followup. Monitor for any signs of aspiration.  7.  Sensory dysesthesia on L side. ? Recovery of sensation vs onset of thalamic pain syndrome, trial gabapentin at night, may need daytime dose if progressive  LOS (Days) 37 A FACE TO FACE EVALUATION WAS PERFORMED  Meshelle Holness E 12/21/2011, 7:30 AM

## 2011-12-22 ENCOUNTER — Inpatient Hospital Stay (HOSPITAL_COMMUNITY): Payer: BC Managed Care – PPO | Admitting: Occupational Therapy

## 2011-12-22 ENCOUNTER — Inpatient Hospital Stay (HOSPITAL_COMMUNITY): Payer: BC Managed Care – PPO | Admitting: Physical Therapy

## 2011-12-22 LAB — CBC
HCT: 36.7 % — ABNORMAL LOW (ref 39.0–52.0)
Hemoglobin: 12.7 g/dL — ABNORMAL LOW (ref 13.0–17.0)
MCH: 30 pg (ref 26.0–34.0)
MCHC: 34.6 g/dL (ref 30.0–36.0)
RBC: 4.24 MIL/uL (ref 4.22–5.81)

## 2011-12-22 NOTE — Progress Notes (Signed)
Occupational Therapy Session Note  Patient Details  Name: Anthony Hardin MRN: [id number] Date of Birth: Apr 21, 1950  Today's Date: 12/22/2011 Time: 0930-1030 and 1478-2956 Time Calculation (min): 60 min and 43 min  Short Term Goals: Week 6:  OT Short Term Goal 1 (Week 6): Pt will complete LB dressing at sit to stand level with supervision  OT Short Term Goal 2 (Week 6): Pt will complete toilet transfer with supervision  OT Short Term Goal 3 (Week 6): Pt will complete walk-in shower transfer with supervision OT Short Term Goal 4 (Week 6): Pt will complete simple meal prep with supervision  Skilled Therapeutic Interventions/Progress Updates:    1) Pt seen for ADL retraining with focus on functional ambulation with RW, sidestepping with RW for transfer into walk-in shower, increased focus on use of LUE with self-care tasks (bathing, dressing, grooming), and increased activity tolerance in standing.  Pt completed bathing at walk-in shower level with min cues to focus on task as pt is easily distracted by conversation.  Pt performed bathing and dressing with increased time and use of LUE.  Pt donned Lt shoe and AFO with min cues for orientation and alternative technique to increase independence.  Completed grooming tasks of oral hygiene and shaving in standing with close supervision.  Cues for weight shifting through LLE in standing.  Pt spontaneously switched from Rt hand to Lt hand with shaving without issue.  2) Hands on family education with brother-in-law who will go home with pt at D/C for a week or so before hired help comes in.  Educated pt and brother in law on sidestepping into bathroom and into simulated "blue box" walk-in shower with 3" lip.  Discussed grab bar placement and suction cup "steady bars" for inside shower to increase safety and independence with bathing and shower transfer.  Pt practiced shower transfer x2 with close supervision.  Discussed bathroom modifications with removing  throw rugs and shelf in bathroom next to shower to allow for increased room and placement of shower chair to increase ease of transfer and bathing.  Therapy Documentation Precautions:  Precautions Precautions: Fall Precaution Comments: Lt neglect Restrictions Weight Bearing Restrictions: No Pain: Pain Assessment Pain Assessment: No/denies pain  See FIM for current functional status  Therapy/Group: Individual Therapy  Leonette Monarch 12/22/2011, 11:58 AM

## 2011-12-22 NOTE — Progress Notes (Signed)
Physical Therapy Session Note  Patient Details  Name: Anthony Hardin MRN: [id number] Date of Birth: 28-Apr-1950  Today's Date: 12/22/2011 Time: 1100-1208 and 4098-1191 Time Calculation (min): 68 min and 43 min  Short Term Goals: Week 3:  PT Short Term Goal 1 (Week 3): = LTG  Skilled Therapeutic Interventions/Progress Updates:   Patient performed gait room > gym x 150' with supervision-light min A with improved forward progression and ability to maintain momentum with full step and stride length bilaterally with intermittent verbal cues for upright trunk and full clearance of LLE even during conversation about D/C.  Reviewed stair negotiation: patient able to demonstrate safe ascension of 6 stairs with bilat UE support on L rail laterally with step to technique and descending forwards with RUE support on rail with safe stepping sequence, verbal cues needed only to attend to L hand position and supervision.  [name] present for end of session; demonstrated to brother patient's gait and discussed sequence for side stepping with RW to L and R through narrow doorway with supervision; discussed with patient that if it is too crowded in bathroom with RW, to enter/exit with HHA RUE.  At end of session assessed patient's safety and gait with SPC x 150' with min A with tactile cues to assist with R lateral weight shift, upright trunk posture to allow full foot clearance and step length LLE but no cues needed for sequence with cane.    Patient performed dynamic standing balance, endurance training with Wii Tennis holding remote and swinging with LUE forehand and across midline for backhand with trunk rotation and semi tandem stance for 6 matches with min A overall.  Discussed with patient and brother home evaluation and all recommendations to improve safety and accessibility.  Brother to be present this pm for stair, car, gait, and floor transfer training.    PM session: Patient's brother  in law had to return to patient's home to complete home modifications but will return on Monday for PT family education.  Reviewed with patient floor <> furniture transfers from prone > quadruped > tall kneeling with UE on furniture > half kneeling > stand with supervision-min A and verbal cues for sequence with patient verbalizing what furniture in his home would be safe to pull up on.  Also reviewed again with patient stair negotiation up and down 6 stairs with L rail but forwards this time with min A with improved stability on LLE and improved control stepping up with RLE.  Reviewed and performed up and down one step/platform x 2 reps with RW and min A with visual and verbal cues for safe sequence but patient demonstrating improved L foot clearance.  Discussed with patient how to perform car transfer ambulating with RW from house; performed car transfer with RW with min A and verbal cues for sequence for turning and sitting first and then bringing each LE into and out of car.  Therapy Documentation Precautions:  Precautions Precautions: Fall Precaution Comments: Lt neglect Restrictions Weight Bearing Restrictions: No Pain: Pain Assessment Pain Assessment: No/denies pain Locomotion : Ambulation Ambulation/Gait Assistance: 4: Min guard Wheelchair Mobility Distance: 150   See FIM for current functional status  Therapy/Group: Individual Therapy  Anthony Hardin [facility] 12/22/2011, 12:42 PM

## 2011-12-22 NOTE — Progress Notes (Signed)
Social Work Patient ID: Anthony Hardin, male   DOB: 1950/08/01, 61 y.o.   MRN: [id number] Pt's brother in-law -Theodoro Grist here to do family education in preparation for discharge Tuesday.  He went through all therapies today. Discussed with both team now recommends OP therapies and will make arrangements at [facility] for PT & OT. Both report it is going well.

## 2011-12-22 NOTE — Progress Notes (Signed)
Patient ID: COURTLIN COCKER, male   DOB: 06-08-1950, 61 y.o.   MRN: [id number] Admitted 10/31/2011 with diffuse weakness and unable to move and fell out of bed onto the floor. MRI of the brain showed a 4 x 1 x 2.5 cm acute hematoma right thalamus with mass effect and displacement distortion of the third ventricle but no suspicion of obstructive hydrocephalus. MRA of the head with no stenosis or occlusion.   Subjective/Complaints: Discussed f/u therapy and D/C plans  Review of Systems  Musculoskeletal: Negative for myalgias.  Neurological: Positive for sensory change and focal weakness.  All other systems reviewed and are negative.   Objective: Vital Signs: Blood pressure 111/68, pulse 76, temperature 97.9 F (36.6 C), temperature source Oral, resp. rate 17, height 5\' 9"  (1.753 m), weight 73.573 kg (162 lb 3.2 oz), SpO2 93.00%. No results found. Results for orders placed during the hospital encounter of 11/14/11 (from the past 72 hour(s))  CBC     Status: Abnormal   Collection Time   12/22/11  6:55 AM      Component Value Range Comment   WBC 5.7  4.0 - 10.5 K/uL    RBC 4.24  4.22 - 5.81 MIL/uL    Hemoglobin 12.7 (*) 13.0 - 17.0 g/dL    HCT 78.2 (*) 95.6 - 52.0 %    MCV 86.6  78.0 - 100.0 fL    MCH 30.0  26.0 - 34.0 pg    MCHC 34.6  30.0 - 36.0 g/dL    RDW 21.3  08.6 - 57.8 %    Platelets 203  150 - 400 K/uL      HEENT: normal Cardio: RRR Resp: CTA B/L GI: BS positive Extremity:  No Edema Skin:   Intact Neuro: Confused, Abnormal Sensory absent sensation L side lt touch and proprio, Abnormal Motor 2/5 finger flex and arm ext, 3/5 hip/knee extensory synergy,0/5 L Ankle Abnormal FMC Reflexes 3+, Reflexes: 3+, Tone:  Hypertonia,  Visual fields intact to confrontation testing Musc/Skel:  Normal  Sensation absent L arm and leg Assessment/Plan: 1. Functional deficits secondary to R thalamic bleed which require 3+ hours per day of interdisciplinary therapy in a comprehensive  inpatient rehab setting. Physiatrist is providing close team supervision and 24 hour management of active medical problems listed below. Physiatrist and rehab team continue to assess barriers to discharge/monitor patient progress toward functional and medical goals. FIM: FIM - Bathing Bathing Steps Patient Completed: Chest;Right Arm;Left Arm;Abdomen;Front perineal area;Buttocks;Right upper leg;Left upper leg;Right lower leg (including foot);Left lower leg (including foot) Bathing: 5: Supervision: Safety issues/verbal cues  FIM - Upper Body Dressing/Undressing Upper body dressing/undressing steps patient completed: Thread/unthread left sleeve of pullover shirt/dress;Put head through opening of pull over shirt/dress;Pull shirt over trunk Upper body dressing/undressing: 5: Supervision: Safety issues/verbal cues FIM - Lower Body Dressing/Undressing Lower body dressing/undressing steps patient completed: Thread/unthread right underwear leg;Thread/unthread left underwear leg;Pull underwear up/down;Thread/unthread right pants leg;Pull pants up/down;Don/Doff right shoe;Don/Doff left shoe;Fasten/unfasten left shoe;Fasten/unfasten right shoe Lower body dressing/undressing: 5: Supervision: Safety issues/verbal cues  FIM - Toileting Toileting steps completed by patient: Adjust clothing after toileting;Performs perineal hygiene;Adjust clothing prior to toileting Toileting Assistive Devices: Grab bar or rail for support Toileting: 5: Supervision: Safety issues/verbal cues  FIM - Diplomatic Services operational officer Devices: Grab bars Toilet Transfers: 4-To toilet/BSC: Min A (steadying Pt. > 75%);4-From toilet/BSC: Min A (steadying Pt. > 75%)  FIM - Bed/Chair Transfer Bed/Chair Transfer Assistive Devices: Arm rests Bed/Chair Transfer: 5: Bed > Chair or W/C:  Supervision (verbal cues/safety issues);5: Chair or W/C > Bed: Supervision (verbal cues/safety issues)  FIM - Locomotion:  Wheelchair Distance: 150 Locomotion: Wheelchair: 0: Activity did not occur FIM - Locomotion: Ambulation Locomotion: Ambulation Assistive Devices: Designer, industrial/product Ambulation/Gait Assistance: 4: Min guard Locomotion: Ambulation: 4: Travels 150 ft or more with minimal assistance (Pt.>75%)  Comprehension Comprehension Mode: Auditory Comprehension: 5-Understands complex 90% of the time/Cues < 10% of the time  Expression Expression Mode: Verbal Expression: 5-Expresses complex 90% of the time/cues < 10% of the time  Social Interaction Social Interaction: 5-Interacts appropriately 90% of the time - Needs monitoring or encouragement for participation or interaction.  Problem Solving Problem Solving: 5-Solves complex 90% of the time/cues < 10% of the time  Memory Memory: 5-Recognizes or recalls 90% of the time/requires cueing < 10% of the time  2. Anticoagulation/DVT prophylaxis with non-Pharmaceutical:  Medical Problem List and Plan:  1. 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 DVT Prophylaxis/Anticoagulation: Subcutaneous Lovenox initiated 10/31/2011. Monitor platelet counts and any signs of bleeding , repeat CBC normal 3. Mood:Sleep-wake cycles have normalized off ritalin  4. Neuropsych: This patient is  capable of making decisions on his/her own behalf at this time.He has  poor awareness of deficits and grossly overestimates his physical and cognitive abilities  5. Hypertension. controlled Norvasc 10 mg daily, clonidine patch 0.1 mg weekly, hydrochlorothiazide 25 mg daily, lisinopril 20 mg a.m. and 10 mg at bedtime. Monitor with increased mobility  6. Dysphagia. Mechanical soft diet thin liquids. Speech therapy followup. Monitor for any signs of aspiration.  7.  Sensory dysesthesia on L side. ? Recovery of sensation vs onset of thalamic pain syndrome, trial gabapentin at night, may need daytime dose if progressive  LOS (Days)  38 A FACE TO FACE EVALUATION WAS PERFORMED  KIRSTEINS,ANDREW E 12/22/2011, 7:58 AM

## 2011-12-23 ENCOUNTER — Inpatient Hospital Stay (HOSPITAL_COMMUNITY): Payer: BC Managed Care – PPO | Admitting: Occupational Therapy

## 2011-12-23 DIAGNOSIS — I619 Nontraumatic intracerebral hemorrhage, unspecified: Secondary | ICD-10-CM

## 2011-12-23 DIAGNOSIS — G8110 Spastic hemiplegia affecting unspecified side: Secondary | ICD-10-CM

## 2011-12-23 DIAGNOSIS — Z5189 Encounter for other specified aftercare: Secondary | ICD-10-CM

## 2011-12-23 NOTE — Progress Notes (Signed)
Occupational Therapy Session Note  Patient Details  Name: Anthony Hardin MRN: [id number] Date of Birth: May 26, 1950  Today's Date: 12/23/2011 Time: 0830-0925 Time Calculation (min): 55 min  Skilled Therapeutic Interventions/Progress Updates: ADL in room shower with focus on L attention, L UE integration and FMC as well as  bilateral UE skills.  THis clinician reminded patient to incorporate left hand use for bilateral task x 1 during session.   Also focus on standing dynamic balance using one hand at a time to complete tasks.    Therapy Documentation Precautions:  Precautions Precautions: Fall Precaution Comments: Lt neglect Restrictions Weight Bearing Restrictions: No  Pain:denied    See FIM for current functional status  Therapy/Group: Individual Therapy  Anthony Hardin 12/23/2011, 3:17 PM

## 2011-12-23 NOTE — Progress Notes (Signed)
Patient ID: Anthony Hardin, male   DOB: 15-Sep-1950, 61 y.o.   MRN: [id number] Admitted 10/31/2011 with diffuse weakness and unable to move and fell out of bed onto the floor. MRI of the brain showed a 4 x 1 x 2.5 cm acute hematoma right thalamus with mass effect and displacement distortion of the third ventricle but no suspicion of obstructive hydrocephalus. MRA of the head with no stenosis or occlusion.   Subjective/Complaints: No complaints today  Review of Systems  Musculoskeletal: Negative for myalgias.  Neurological: Positive for sensory change and focal weakness.  All other systems reviewed and are negative.   Objective: Vital Signs: Blood pressure 96/64, pulse 52, temperature 98.1 F (36.7 C), temperature source Oral, resp. rate 20, height 5\' 9"  (1.753 m), weight 73.573 kg (162 lb 3.2 oz), SpO2 97.00%. No results found. Results for orders placed during the hospital encounter of 11/14/11 (from the past 72 hour(s))  CBC     Status: Abnormal   Collection Time   12/22/11  6:55 AM      Component Value Range Comment   WBC 5.7  4.0 - 10.5 K/uL    RBC 4.24  4.22 - 5.81 MIL/uL    Hemoglobin 12.7 (*) 13.0 - 17.0 g/dL    HCT 40.9 (*) 81.1 - 52.0 %    MCV 86.6  78.0 - 100.0 fL    MCH 30.0  26.0 - 34.0 pg    MCHC 34.6  30.0 - 36.0 g/dL    RDW 91.4  78.2 - 95.6 %    Platelets 203  150 - 400 K/uL      HEENT: normal Cardio: RRR Resp: CTA B/L GI: BS positive Extremity:  No Edema Skin:   Intact Neuro: Confused, Abnormal Sensory absent sensation L side lt touch and proprio, Abnormal Motor 2/5 finger flex and arm ext, 3/5 hip/knee extensory synergy,0/5 L Ankle Abnormal FMC Reflexes 3+, Reflexes: 3+, Tone:  Hypertonia,  Visual fields intact to confrontation testing Musc/Skel:  Normal  Sensation absent L arm and leg Assessment/Plan: 1. Functional deficits secondary to R thalamic bleed which require 3+ hours per day of interdisciplinary therapy in a comprehensive inpatient rehab  setting. Physiatrist is providing close team supervision and 24 hour management of active medical problems listed below. Physiatrist and rehab team continue to assess barriers to discharge/monitor patient progress toward functional and medical goals. FIM: FIM - Bathing Bathing Steps Patient Completed: Chest;Right Arm;Left Arm;Abdomen;Front perineal area;Buttocks;Right upper leg;Left upper leg;Right lower leg (including foot);Left lower leg (including foot) Bathing: 5: Supervision: Safety issues/verbal cues  FIM - Upper Body Dressing/Undressing Upper body dressing/undressing steps patient completed: Thread/unthread right sleeve of pullover shirt/dresss;Thread/unthread left sleeve of pullover shirt/dress;Put head through opening of pull over shirt/dress;Pull shirt over trunk Upper body dressing/undressing: 5: Supervision: Safety issues/verbal cues FIM - Lower Body Dressing/Undressing Lower body dressing/undressing steps patient completed: Thread/unthread right underwear leg;Thread/unthread left underwear leg;Pull underwear up/down;Thread/unthread right pants leg;Thread/unthread left pants leg;Pull pants up/down;Don/Doff right shoe;Don/Doff left shoe;Fasten/unfasten right shoe;Fasten/unfasten left shoe Lower body dressing/undressing: 5: Set-up assist to: Don/Doff TED stocking  FIM - Toileting Toileting steps completed by patient: Adjust clothing after toileting;Performs perineal hygiene;Adjust clothing prior to toileting Toileting Assistive Devices: Grab bar or rail for support Toileting: 5: Supervision: Safety issues/verbal cues  FIM - Diplomatic Services operational officer Devices: Grab bars Toilet Transfers: 4-To toilet/BSC: Min A (steadying Pt. > 75%);4-From toilet/BSC: Min A (steadying Pt. > 75%)  FIM - Bed/Chair Transfer Bed/Chair Transfer Assistive Devices: Therapist, occupational:  5: Supine > Sit: Supervision (verbal cues/safety issues);5: Sit > Supine: Supervision (verbal  cues/safety issues);5: Bed > Chair or W/C: Supervision (verbal cues/safety issues);5: Chair or W/C > Bed: Supervision (verbal cues/safety issues)  FIM - Locomotion: Wheelchair Distance: 150 Locomotion: Wheelchair: 5: Travels 150 ft or more: maneuvers on rugs and over door sills with supervision, cueing or coaxing FIM - Locomotion: Ambulation Locomotion: Ambulation Assistive Devices: Cane - Straight;Walker - Rolling Ambulation/Gait Assistance: 4: Min guard Locomotion: Ambulation: 4: Travels 150 ft or more with minimal assistance (Pt.>75%)  Comprehension Comprehension Mode: Auditory Comprehension: 5-Understands complex 90% of the time/Cues < 10% of the time  Expression Expression Mode: Verbal Expression: 5-Expresses complex 90% of the time/cues < 10% of the time  Social Interaction Social Interaction: 5-Interacts appropriately 90% of the time - Needs monitoring or encouragement for participation or interaction.  Problem Solving Problem Solving: 5-Solves complex 90% of the time/cues < 10% of the time  Memory Memory: 5-Recognizes or recalls 90% of the time/requires cueing < 10% of the time  2. Anticoagulation/DVT prophylaxis with non-Pharmaceutical:  Medical Problem List and Plan:  1. 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 DVT Prophylaxis/Anticoagulation: Subcutaneous Lovenox initiated 10/31/2011. Monitor platelet counts and any signs of bleeding , repeat CBC normal 3. Mood:Sleep-wake cycles have normalized off ritalin  4. Neuropsych: This patient is  capable of making decisions on his/her own behalf at this time.He has  poor awareness of deficits and grossly overestimates his physical and cognitive abilities  5. Hypertension. controlled Norvasc 10 mg daily, clonidine patch 0.1 mg weekly, hydrochlorothiazide 25 mg daily, lisinopril 20 mg a.m. and 10 mg at bedtime. Monitor with increased mobility  6. Dysphagia. Mechanical soft  diet thin liquids. Speech therapy followup. Monitor for any signs of aspiration.  7.  Sensory dysesthesia on L side. ? Recovery of sensation vs onset of thalamic pain syndrome, trial gabapentin at night, may need daytime dose if progressive  LOS (Days) 39 A FACE TO FACE EVALUATION WAS PERFORMED  SWARTZ,ZACHARY T 12/23/2011, 7:43 AM

## 2011-12-24 ENCOUNTER — Inpatient Hospital Stay (HOSPITAL_COMMUNITY): Payer: BC Managed Care – PPO | Admitting: Physical Therapy

## 2011-12-24 NOTE — Progress Notes (Signed)
Patient ID: Anthony Hardin, male   DOB: 02-Feb-1951, 61 y.o.   MRN: [id number] Admitted 10/31/2011 with diffuse weakness and unable to move and fell out of bed onto the floor. MRI of the brain showed a 4 x 1 x 2.5 cm acute hematoma right thalamus with mass effect and displacement distortion of the third ventricle but no suspicion of obstructive hydrocephalus. MRA of the head with no stenosis or occlusion.   Subjective/Complaints: No complaints today  Review of Systems  Musculoskeletal: Negative for myalgias.  Neurological: Positive for sensory change and focal weakness.  All other systems reviewed and are negative.   Objective: Vital Signs: Blood pressure 99/63, pulse 57, temperature 98.1 F (36.7 C), temperature source Oral, resp. rate 17, height 5\' 9"  (1.753 m), weight 73.573 kg (162 lb 3.2 oz), SpO2 97.00%. No results found. Results for orders placed during the hospital encounter of 11/14/11 (from the past 72 hour(s))  CBC     Status: Abnormal   Collection Time   12/22/11  6:55 AM      Component Value Range Comment   WBC 5.7  4.0 - 10.5 K/uL    RBC 4.24  4.22 - 5.81 MIL/uL    Hemoglobin 12.7 (*) 13.0 - 17.0 g/dL    HCT 78.2 (*) 95.6 - 52.0 %    MCV 86.6  78.0 - 100.0 fL    MCH 30.0  26.0 - 34.0 pg    MCHC 34.6  30.0 - 36.0 g/dL    RDW 21.3  08.6 - 57.8 %    Platelets 203  150 - 400 K/uL      HEENT: normal Cardio: RRR Resp: CTA B/L GI: BS positive Extremity:  No Edema Skin:   Intact Neuro: Confused, Abnormal Sensory absent sensation L side lt touch and proprio, Abnormal Motor 2/5 finger flex and arm ext, 3/5 hip/knee extensory synergy,0/5 L Ankle Abnormal FMC Reflexes 3+, Reflexes: 3+, Tone:  Hypertonia,  Visual fields intact to confrontation testing Musc/Skel:  Normal  Sensation absent L arm and leg Assessment/Plan: 1. Functional deficits secondary to R thalamic bleed which require 3+ hours per day of interdisciplinary therapy in a comprehensive inpatient rehab  setting. Physiatrist is providing close team supervision and 24 hour management of active medical problems listed below. Physiatrist and rehab team continue to assess barriers to discharge/monitor patient progress toward functional and medical goals. FIM: FIM - Bathing Bathing Steps Patient Completed: Chest;Right Arm;Left Arm;Abdomen;Front perineal area;Buttocks;Right upper leg;Left upper leg;Right lower leg (including foot);Left lower leg (including foot) Bathing: 5: Supervision: Safety issues/verbal cues  FIM - Upper Body Dressing/Undressing Upper body dressing/undressing steps patient completed: Thread/unthread right sleeve of pullover shirt/dresss;Thread/unthread left sleeve of pullover shirt/dress;Put head through opening of pull over shirt/dress;Pull shirt over trunk Upper body dressing/undressing: 5: Supervision: Safety issues/verbal cues FIM - Lower Body Dressing/Undressing Lower body dressing/undressing steps patient completed: Thread/unthread right underwear leg;Thread/unthread left underwear leg;Pull underwear up/down;Thread/unthread right pants leg;Thread/unthread left pants leg;Pull pants up/down;Don/Doff right shoe;Don/Doff left shoe;Fasten/unfasten right shoe;Fasten/unfasten left shoe Lower body dressing/undressing: 5: Set-up assist to: Don/Doff TED stocking  FIM - Toileting Toileting steps completed by patient: Adjust clothing prior to toileting;Performs perineal hygiene;Adjust clothing after toileting Toileting Assistive Devices: Grab bar or rail for support Toileting: 4: Steadying assist  FIM - Diplomatic Services operational officer Devices: Grab bars Toilet Transfers: 4-To toilet/BSC: Min A (steadying Pt. > 75%)  FIM - Bed/Chair Transfer Bed/Chair Transfer Assistive Devices: Therapist, occupational: 5: Supine > Sit: Supervision (verbal cues/safety issues);5: Sit >  Supine: Supervision (verbal cues/safety issues);5: Bed > Chair or W/C: Supervision (verbal cues/safety  issues);5: Chair or W/C > Bed: Supervision (verbal cues/safety issues)  FIM - Locomotion: Wheelchair Distance: 150 Locomotion: Wheelchair: 5: Travels 150 ft or more: maneuvers on rugs and over door sills with supervision, cueing or coaxing FIM - Locomotion: Ambulation Locomotion: Ambulation Assistive Devices: Cane - Straight;Walker - Rolling Ambulation/Gait Assistance: 4: Min guard Locomotion: Ambulation: 4: Travels 150 ft or more with minimal assistance (Pt.>75%)  Comprehension Comprehension Mode: Auditory Comprehension: 5-Understands complex 90% of the time/Cues < 10% of the time  Expression Expression Mode: Verbal Expression: 5-Expresses complex 90% of the time/cues < 10% of the time  Social Interaction Social Interaction: 5-Interacts appropriately 90% of the time - Needs monitoring or encouragement for participation or interaction.  Problem Solving Problem Solving: 5-Solves complex 90% of the time/cues < 10% of the time  Memory Memory: 5-Recognizes or recalls 90% of the time/requires cueing < 10% of the time  2. Anticoagulation/DVT prophylaxis with non-Pharmaceutical:  Medical Problem List and Plan:  1. 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 DVT Prophylaxis/Anticoagulation: Subcutaneous Lovenox initiated 10/31/2011. Monitor platelet counts and any signs of bleeding , repeat CBC normal 3. Mood:Sleep-wake cycles have normalized off ritalin  4. Neuropsych: This patient is  capable of making decisions on his/her own behalf at this time.He has  poor awareness of deficits and grossly overestimates his physical and cognitive abilities  5. Hypertension. controlled Norvasc 10 mg daily, clonidine patch 0.1 mg weekly, hydrochlorothiazide 25 mg daily, lisinopril 20 mg a.m. and 10 mg at bedtime. Monitor with increased mobility  6. Dysphagia. Mechanical soft diet thin liquids. Speech therapy followup. Monitor for any signs of  aspiration.  7.  Sensory dysesthesia on L side. ? Recovery of sensation vs onset of thalamic pain syndrome, trial gabapentin at night, may need daytime dose if progressive  LOS (Days) 40 A FACE TO FACE EVALUATION WAS PERFORMED  Rashaun Wichert T 12/24/2011, 7:38 AM

## 2011-12-24 NOTE — Progress Notes (Signed)
At 2325 complained of a headache, PRN tylenol given. Patient reports having headache every night, relieved with tylenol. Left PRAFO boot applied at bedtime. Anthony Hardin A

## 2011-12-24 NOTE — Progress Notes (Signed)
Physical Therapy Note  Patient Details  Name: Anthony Hardin MRN: [id number] Date of Birth: 12-24-1950 Today's Date: 12/24/2011  1300-1355 (55 minutes) individual Pain: no reported pain Focus of treatment: gait training with/without AD Treatment: Pt ambulates to/from room to gym (120 feet) with RW + [place]ard AFO on left min assist with min/mod foot drag on left and Lt hip retraction in stance;  up/down 6 inch step LT LE X 10 for quad /hip strengthening/control/.; gait 80 feet X 1 with RW with tactile and vcs to forwardly rotate LT hip in stance min assist; gait forward/backward 8 feet X 4 mod assist for balance without AD to increase weight bearing LT LE in stance; gait sideways along mat mod assist for balance.   [name],JIM 12/24/2011, 7:47 AM

## 2011-12-25 ENCOUNTER — Inpatient Hospital Stay (HOSPITAL_COMMUNITY): Payer: BC Managed Care – PPO | Admitting: Occupational Therapy

## 2011-12-25 ENCOUNTER — Inpatient Hospital Stay (HOSPITAL_COMMUNITY): Payer: BC Managed Care – PPO | Admitting: Speech Pathology

## 2011-12-25 ENCOUNTER — Inpatient Hospital Stay (HOSPITAL_COMMUNITY): Payer: BC Managed Care – PPO | Admitting: Physical Therapy

## 2011-12-25 LAB — BASIC METABOLIC PANEL
BUN: 20 mg/dL (ref 6–23)
CO2: 27 mEq/L (ref 19–32)
Calcium: 10.1 mg/dL (ref 8.4–10.5)
GFR calc non Af Amer: >90 mL/min (ref >90)
Glucose, Bld: 103 mg/dL — ABNORMAL HIGH (ref 70–99)
Sodium: 136 mEq/L (ref 135–145)

## 2011-12-25 NOTE — Progress Notes (Signed)
Social Work Patient ID: Anthony Hardin, male   DOB: 1950-12-25, 61 y.o.   MRN: [id number] Have found out Dr Kevan Ny is not taking new patients so pursuing others in the same practice who are.  Awaiting return call Would like to have an appt for pt prior to discharge tomorrow.

## 2011-12-25 NOTE — Progress Notes (Signed)
I agree with this assessment 

## 2011-12-25 NOTE — Discharge Summary (Signed)
  Discharge summary job # [id number]

## 2011-12-25 NOTE — Progress Notes (Signed)
Social Work Patient ID: Anthony Hardin, male   DOB: 07-24-1950, 61 y.o.   MRN: [id number] Met with pt to answer question regarding discharge and follow up.  He is pleased with Op rehab and his progress while here. Discussed primary MD he had signed up with Dr Marden Noble will contact his office regarding obtaining an appt. Bother in-law finishing Up the adaptations at home today in preparation for discharge tomorrow.  Searching Op therapies in [place] for when pt is there in Nov. Informed pt once Find out options.

## 2011-12-25 NOTE — Progress Notes (Signed)
Physical Therapy Discharge Summary  Patient Details  Name: Anthony Hardin MRN: [id number] Date of Birth: [birth year]  Today's Date: 12/25/2011 Time: 1000-1100; 2725-3664 Time Calculation (min): 60 min; 60 min  Patient has met 9 of 9 long term goals due to improved activity tolerance, improved balance, improved postural control, increased strength, increased range of motion, ability to compensate for deficits, functional use of  left upper extremity and left lower extremity, improved attention, improved awareness and improved coordination.  Patient to discharge at supervision level for w/c mobility and a min assist level for ambulation with RW.   Patient's care partner is independent to provide the necessary physical assistance at discharge.  Recommendation:  Patient will benefit from ongoing skilled PT services in home health setting initially, to continue to advance safe functional mobility, address ongoing impairments in intermittent L-sided inattention, L-sided weakness, impaired sensation and coordination, impaired activity tolerance, and minimize fall risk.  Equipment: Patient will be going home with w/c and RW for mobility needs  Reasons for discharge: discharge from hospital  Patient/family agrees with progress made and goals achieved: Yes  PT Discharge Precautions/Restrictions Precautions Precautions: Fall Restrictions Weight Bearing Restrictions: No Vital Signs Therapy Vitals BP: 120/71 mmHg Pain Pain Assessment Pain Assessment: No/denies pain    Cognition Orientation Level: Oriented X4 Sensation Sensation Light Touch: Impaired by gross assessment (Both LUE and LLE impaired to light touch. Patient reports tingling on L side when touched, however unable to determine where he is being touched, as tested with eyes closed.) Coordination Gross Motor Movements are Fluid and Coordinated: No Fine Motor Movements are Fluid and Coordinated: No Coordination and Movement  Description: Patient continues to have some coordination impairments as noted by having patient perform heel to knee slides with LLE. Patient unable to perform smoothly. Mobility Patient propelled w/c from room <> PT gym (~150 feet) using R hemi technique and supervision. Patient demonstrated ability to don and doff L foot plate with min vcs.  Patient performed car transfer using RW and supervision/min guard. Patient able to open car door without assistance. Patient required intermittent vcs for best hand placement, forward trunk flexion to clearing head, etc.  Patient performed floor > furniture transfer x 2 reps with UE support and min guard. Patient was able to verbalize and demonstrate the appropriate actions to take if he falls at home with min cueing.    Locomotion  Ambulation Ambulation: Yes Ambulation/Gait Assistance: 4: Min guard Ambulation Distance (Feet): 150 Feet  Patient ambulated from car > large PT gym (~150 feet) with min guard for safety. Patient continues to present with intermittent difficulty clearing L foot and intermittent genu recurvatum, but is able to self correct without vcs. Patient ambulates with decreased step width and was instructed to increase width to improve balance during gait.  Patient ascended 4 steps sideward, using L handrail, with bil UE support and supervision. Patient descended four steps forward using RUE support with step to gait pattern, requiring supervision. Patient demonstrated ability to ascend/descend curb using RW and min assist for safety. Patient required vcs for sequencing and form.   Extremity Assessment  RLE Assessment RLE Assessment: Within Functional Limits LLE Assessment LLE Assessment: Exceptions to [facility] LLE Strength Left Hip Flexion: 3/5 Left Knee Flexion: 3/5 Left Knee Extension: 4/5 Left Ankle Dorsiflexion: 3/5  See FIM for current functional status  Second session: Brother-in-law, Onalee Hua, present for family education. PT  educated family member on the following tasks: ascending/descending 4 steps, ascending/descending curb with RW, car transfer,  floor > furniture transfer, donning/doffing AFO in different shoes, and w/c management. Patient and caregiver verbalized/demonstrated understanding of all tasks performed.   Patient's equipment was in room upon PT arrival and set up appropriately. Patient's w/c needed to be lowered in order for patient to performed efficient R hemi technique. Social worker was notified and w/c taken to be adjusted. Foot plates were also adjusted to correct length.  Moneisha Vosler Hamilton DPT Student 12/25/2011, 11:41 AM

## 2011-12-25 NOTE — Progress Notes (Signed)
Occupational Therapy Session Note  Patient Details  Name: Anthony Hardin MRN: [id number] Date of Birth: 1950/06/21  Today's Date: 12/25/2011 Time: 4098-1191 and 4782-9562 Time Calculation (min): 60 min and 45 min  Short Term Goals: Week 6:  OT Short Term Goal 1 (Week 6): Pt will complete LB dressing at sit to stand level with supervision  OT Short Term Goal 2 (Week 6): Pt will complete toilet transfer with supervision  OT Short Term Goal 3 (Week 6): Pt will complete walk-in shower transfer with supervision OT Short Term Goal 4 (Week 6): Pt will complete simple meal prep with supervision  Skilled Therapeutic Interventions/Progress Updates:    1) Pt completed ADL retraining at overall supervision level.  Pt completed functional transfers with RW, bathing, and dressing with overall supervision for safety and <25% cues for Lt attention and LUE integration with self-care tasks.  Pt demonstrated increased spontaneous use of LUE without cues with bathing and dressing, however continues to require occasional cues for LUE placement in w/c (as it tends to hang down by wheel).  Pt demonstrated ability to don Lt shoe with AFO without any cues for technique.  Grooming completed in standing with supervision.  2) 1:1 OT with focus on LUE forced use with [facility] and in-hand manipulation tasks.  Engaged in 9 hole peg test with RUE: 41 seconds and LUE: 4 min and 52 secs (4 mins to put in and 50 seconds to take out).  Pt completed similar activity with 6 pegs which were slightly larger in diameter with 30 seconds to remove and 1 min and 20 seconds to put in.  Pt demonstrated increased in-hand manipulation as well as increased control of pinch and release.  Engaged in [facility] activity with pt retrieving a key to locked ADL apartment and manipulating keys to unlock door.  Pt required increased time to manipulate and demonstrated decreased sensation by dropping keys post unlocking door.  Engaged in functional transfers in  ADL apartment with maneuvering around bathroom with RW and transfers from various surfaces.  Therapy Documentation Precautions:  Precautions Precautions: Fall Precaution Comments: Lt neglect Restrictions Weight Bearing Restrictions: No Pain: Pain Assessment Pain Assessment: No/denies pain ADL: ADL Eating: Independent Where Assessed-Eating: Wheelchair Grooming: Supervision/safety Where Assessed-Grooming: Standing at sink Upper Body Bathing: Supervision/safety Where Assessed-Upper Body Bathing: Shower Lower Body Bathing: Supervision/safety Where Assessed-Lower Body Bathing: Shower Upper Body Dressing: Supervision/safety Where Assessed-Upper Body Dressing: Sitting at sink Lower Body Dressing: Supervision/safety Where Assessed-Lower Body Dressing: Sitting at sink;Standing at sink Toileting: Supervision/safety Where Assessed-Toileting: Teacher, adult education: Close supervision Toilet Transfer Method: Ambulating (with RW) Acupuncturist: Engineer, technical sales: Not assessed Film/video editor: Close supervision Film/video editor Method: Designer, industrial/product: Transfer tub bench;Grab bars ADL Comments: Pt attending to LUE more in functional tasks, continues to have extreme sensory loss. Pt with increased initiation and spontaneous use of LUE in functional self-care tasks. Pt with increased attention to Lt side of body with bathing and grooming tasks. Increased focus on sidestepping with transfers as bathroom at home is very narrow and will require sidesteps to get to toilet and walk-in shower  Pt's brother in law has been present for education and modifications (grab bars and railing) have been added to increase pt's safety and independence upon return home.  See FIM for current functional status  Therapy/Group: Individual Therapy  Leonette Monarch 12/25/2011, 12:27 PM

## 2011-12-25 NOTE — Discharge Summary (Signed)
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  Subcutaneous Lovenox for deep venous thrombosis prophylaxis, hypertension, dysphagia-resolved.  This is a 61 year old right-handed male with history of hypertension, who is employed as a Chief Executive Officer at [employer].  Admitted October 31, 2011 with diffuse weakness and unable to move, fell out of his bed onto the floor.  MRI of the brain showed a 4 x 1 cm x 2.5 cm acute hematoma, right thalamus with mass effect, displacement, distortion of the 3rd ventricle but no suspicion of obstructive hydrocephalus.  MRA of the head with no stenosis or occlusion.  Close monitoring of blood pressure with intravenous Cardene that was slowly weaned and transitioned to by mouth for his medications.  Neurosurgery Dr. Marikay Alar consulted, advised conservative care with followup cranial CT scans.  Latest cranial CT scan November 04, 2011 showed stable left hemorrhage involving right thalamus with stable surrounding vasogenic edema.  Noted bouts of diplopia as well as left inattention. Trial of Ritalin was added November 11, 2011 to enable the patient to attend better to tasks and later discontinued.  Subcutaneous Lovenox was added for DVT prophylaxis.  He was maintained on a mechanical soft diet. He was admitted for a comprehensive rehab program.  PAST MEDICAL HISTORY:  See discharge diagnoses.  SOCIAL HISTORY:  Lives alone.  He is a Emergency planning/management officer for [employer]. One level home, 2 steps to entry.  Functional history prior to admission was independent driving. Functional status upon  admission to rehab services is +2 total assist stand pivot transfers, ambulation not tested.  PHYSICAL EXAMINATION:  VITAL SIGNS:  Blood pressure 127/82, pulse 62, temperature 98.2, respirations 18. GENERAL:  This was an alert male in no acute distress.  Noted left side inattention.  He was able to name person, place as well as occupation. He followed simple commands, but needed some cues to maintain completing task.  He was quite distractible, frequently using humor to try to avoid answering questions. LUNGS:  Clear to auscultation. CARDIAC:  Regular rate and rhythm. ABDOMEN:  Soft, nontender.  Good bowel sounds.  REHABILITATION HOSPITAL COURSE:  The patient was admitted to Inpatient Rehab Services with therapies initiated on a 3-hour daily basis consisting of physical therapy, occupational therapy, speech therapy, and rehabilitation nursing.  The following issues were addressed during the patient's rehabilitation stay.  Pertaining to Mr. Stotz's right thalamic hemorrhage resulting in left hemiplegia remained stable followed by Neurology Services.  Plan was to follow up with Dr. Delia Heady as an outpatient.  Latest cranial CT scan November 04, 2011 showed to be stable.  Blood pressures monitored on lisinopril and clonidine as well as Norvasc with hydrochlorothiazide with diastolic pressures ranging 68-71.  He denied any headache or vision changes.  He would follow up with ongoing medical care as an outpatient.  His  diet was slowly advanced to a regular consistency which he tolerated well without any signs of aspiration.  He did have some sensory dysesthesias on the left side.  He was placed on Neurontin 100 mg at bedtime.  He would be adjusted as needed.  The patient received weekly collaborative interdisciplinary team conferences to discuss estimated length of stay, family teaching, and any barriers to discharge.  He was continent of bowel and bladder, required supervision of  bathing and dressing except minimal assist lower body dressing, using a left shoe AFO, supervision assist grooming and standing, minimal assist transfers and rolling walker to shower and toilet.  Overall the patient continued to show progressive gains.  Ongoing family teaching was completed and plans to be discharged with family.  DISCHARGE MEDICATIONS: 1. Clonidine patch 0.1 mg weekly. 2. Neurontin 100 mg at bedtime. 3. Hydrochlorothiazide 25 mg daily. 4. Lisinopril 20 mg daily and 10 mg at bedtime. 5. Multivitamin daily. 6. Potassium chloride 10 mEq daily. 7. Senokot tablets 1 p.o. b.i.d., hold for loose stools. 8. The patient was on subcutaneous Lovenox throughout his rehab course     for DVT prophylaxis.  This was discontinued at time of discharge.  SPECIAL INSTRUCTIONS:  The patient will follow up with Dr. Claudette [facility] as advised; Dr. Delia Heady, Neurology Service, call for appointment; Dr. Marikay Alar, Neurosurgery as needed.  The patient was advised no driving.  Ongoing therapies would be dictated as per [employer].     Mariam Dollar, P.A.   ______________________________ Erick Colace, M.D.    DA/MEDQ  D:  12/25/2011  T:  12/25/2011  Job:  [id number]  cc:   Erick Colace, M.D. Pramod P. Pearlean Brownie, MD Tia Alert, MD

## 2011-12-25 NOTE — Progress Notes (Signed)
Occupational Therapy Discharge Summary  Patient Details  Name: Anthony Hardin MRN: [id number] Date of Birth: 02/22/1951  Today's Date: 12/25/2011 Time: 6213-0865 Time Calculation (min): 60 min  Patient has met 10 of 10 long term goals due to improved activity tolerance, improved balance, postural control, ability to compensate for deficits, functional use of  LEFT upper and LEFT lower extremity, improved attention and improved awareness.  Patient to discharge at overall Supervision level.  Patient's care partner is independent to provide the necessary cognitive assistance at discharge.  [name] has been present for hands on family education and is able to provide the appropriate cues to increase LUE awareness and safety with ADLs and IADLs.  Reasons goals not met: N/A/  Recommendation:  Patient will benefit from ongoing skilled OT services in outpatient setting to continue to advance functional skills in the area of BADL, iADL and Reduce care partner burden.  Equipment: shower chair, RW, and w/c  Reasons for discharge: treatment goals met and discharge from hospital  Patient/family agrees with progress made and goals achieved: Yes  OT Discharge Precautions/Restrictions  Precautions Precautions: Fall Precaution Comments: Lt neglect  Pain Pain Assessment Pain Assessment: No/denies pain ADL ADL Eating: Independent Where Assessed-Eating: Wheelchair Grooming: Supervision/safety Where Assessed-Grooming: Standing at sink Upper Body Bathing: Supervision/safety Where Assessed-Upper Body Bathing: Shower Lower Body Bathing: Supervision/safety Where Assessed-Lower Body Bathing: Shower Upper Body Dressing: Supervision/safety Where Assessed-Upper Body Dressing: Sitting at sink Lower Body Dressing: Supervision/safety Where Assessed-Lower Body Dressing: Sitting at sink;Standing at sink Toileting: Supervision/safety Where Assessed-Toileting: Teacher, adult education:  Close supervision Toilet Transfer Method: Ambulating (with RW) Acupuncturist: Engineer, technical sales: Not assessed Film/video editor: Close supervision Film/video editor Method: Designer, industrial/product: Transfer tub bench;Grab bars ADL Comments: Pt attending to LUE more in functional tasks, continues to have extreme sensory loss. Pt with increased initiation and spontaneous use of LUE in functional self-care tasks. Pt with increased attention to Lt side of body with bathing and grooming tasks. Increased focus on sidestepping with transfers as bathroom at home is very narrow and will require sidesteps to get to toilet and walk-in shower  Pt's brother in law has been present for education and modifications (grab bars and railing) have been added to increase pt's safety and independence upon return home. Vision/Perception  Vision - History Baseline Vision: Wears glasses all the time Patient Visual Report: Diplopia (reports improved, only difficulty at night when on iphone) Vision - Assessment Eye Alignment: Within Functional Limits Vision Assessment: Vision tested Ocular Range of Motion: Within Functional Limits Alignment/Gaze Preference: Within Defined Limits Saccades: Decreased speed of saccadic movement  Cognition Overall Cognitive Status: Impaired Arousal/Alertness: Awake/alert Orientation Level: Oriented X4 Attention: Alternating Focused Attention: Appears intact Sustained Attention: Appears intact Sustained Attention Impairment: Verbal basic;Functional basic Selective Attention: Appears intact Alternating Attention: Appears intact Memory: Appears intact Sensation Sensation Light Touch: Impaired by gross assessment (Both LUE and LLE impaired to light touch) Stereognosis: Impaired Detail Stereognosis Impaired Details: Absent LUE Proprioception: Impaired by gross assessment Additional Comments: Pt is demonstrating improvements in  compensatory strategies for sensation and proprioception using vision. Coordination Gross Motor Movements are Fluid and Coordinated: No Fine Motor Movements are Fluid and Coordinated: No Finger Nose Finger Test: dysmetria, ataxic movements 9 Hole Peg Test: RUE: 41 sec. LUE: and 52 sec. Extremity/Trunk Assessment RUE Assessment RUE Assessment: Within Functional Limits LUE Assessment LUE Assessment: Exceptions to WFL LUE AROM (degrees) Overall AROM Left Upper Extremity: Deficits (strength  grossly 3/5 overall) LUE Strength Left Shoulder Flexion: 3/5  See FIM for current functional status  Leonette Monarch 12/25/2011, 4:06 PM

## 2011-12-25 NOTE — Progress Notes (Signed)
Social Work Patient ID: Anthony Hardin, male   DOB: 1951/03/03, 61 y.o.   MRN: [id number] Pt requires a lightweight wheelchair to be able to self propel and assist with his ADL's.  He is not able to self propel a standard wheelchair.

## 2011-12-25 NOTE — Progress Notes (Signed)
Patient ID: RESHAUN BRISENO, male   DOB: Apr 30, 1950, 61 y.o.   MRN: [id number] Admitted 10/31/2011 with diffuse weakness and unable to move and fell out of bed onto the floor. MRI of the brain showed a 4 x 1 x 2.5 cm acute hematoma right thalamus with mass effect and displacement distortion of the third ventricle but no suspicion of obstructive hydrocephalus. MRA of the head with no stenosis or occlusion.   Subjective/Complaints: No complaints today  Review of Systems  Musculoskeletal: Negative for myalgias.  Neurological: Positive for sensory change and focal weakness.  All other systems reviewed and are negative.   Objective: Vital Signs: Blood pressure 124/68, pulse 52, temperature 98 F (36.7 C), temperature source Oral, resp. rate 18, height 5\' 9"  (1.753 m), weight 73.573 kg (162 lb 3.2 oz), SpO2 98.00%. No results found. No results found for this or any previous visit (from the past 72 hour(s)).   HEENT: normal Cardio: RRR Resp: CTA B/L GI: BS positive Extremity:  No Edema Skin:   Intact Neuro: Confused, Abnormal Sensory absent sensation L side lt touch and proprio, Abnormal Motor 2/5 finger flex and arm ext, 3/5 hip/knee extensory synergy,0/5 L Ankle Abnormal FMC Reflexes 3+, Reflexes: 3+, Tone:  Hypertonia,  Visual fields intact to confrontation testing Musc/Skel:  Normal  Sensation absent L arm and leg Assessment/Plan: 1. Functional deficits secondary to R thalamic bleed which require 3+ hours per day of interdisciplinary therapy in a comprehensive inpatient rehab setting. Physiatrist is providing close team supervision and 24 hour management of active medical problems listed below. Physiatrist and rehab team continue to assess barriers to discharge/monitor patient progress toward functional and medical goals. FIM: FIM - Bathing Bathing Steps Patient Completed: Chest;Right Arm;Left Arm;Abdomen;Front perineal area;Buttocks;Right upper leg;Left upper leg;Right lower leg  (including foot);Left lower leg (including foot) Bathing: 5: Supervision: Safety issues/verbal cues  FIM - Upper Body Dressing/Undressing Upper body dressing/undressing steps patient completed: Thread/unthread right sleeve of pullover shirt/dresss;Thread/unthread left sleeve of pullover shirt/dress;Put head through opening of pull over shirt/dress;Pull shirt over trunk Upper body dressing/undressing: 5: Supervision: Safety issues/verbal cues FIM - Lower Body Dressing/Undressing Lower body dressing/undressing steps patient completed: Thread/unthread right underwear leg;Thread/unthread left underwear leg;Pull underwear up/down;Thread/unthread right pants leg;Thread/unthread left pants leg;Pull pants up/down;Don/Doff right shoe;Don/Doff left shoe;Fasten/unfasten right shoe;Fasten/unfasten left shoe Lower body dressing/undressing: 5: Set-up assist to: Don/Doff TED stocking  FIM - Toileting Toileting steps completed by patient: Adjust clothing prior to toileting;Performs perineal hygiene;Adjust clothing after toileting Toileting Assistive Devices: Grab bar or rail for support Toileting: 4: Steadying assist  FIM - Diplomatic Services operational officer Devices: Grab bars Toilet Transfers: 4-To toilet/BSC: Min A (steadying Pt. > 75%)  FIM - Bed/Chair Transfer Bed/Chair Transfer Assistive Devices: Therapist, occupational: 5: Supine > Sit: Supervision (verbal cues/safety issues);5: Sit > Supine: Supervision (verbal cues/safety issues);5: Bed > Chair or W/C: Supervision (verbal cues/safety issues);5: Chair or W/C > Bed: Supervision (verbal cues/safety issues)  FIM - Locomotion: Wheelchair Distance: 150 Locomotion: Wheelchair: 5: Travels 150 ft or more: maneuvers on rugs and over door sills with supervision, cueing or coaxing FIM - Locomotion: Ambulation Locomotion: Ambulation Assistive Devices: Cane - Straight;Walker - Rolling Ambulation/Gait Assistance: 4: Min guard Locomotion: Ambulation:  4: Travels 150 ft or more with minimal assistance (Pt.>75%)  Comprehension Comprehension Mode: Auditory Comprehension: 5-Understands complex 90% of the time/Cues < 10% of the time  Expression Expression Mode: Verbal Expression: 5-Expresses complex 90% of the time/cues < 10% of the time  Restaurant manager, fast food Social  Interaction: 5-Interacts appropriately 90% of the time - Needs monitoring or encouragement for participation or interaction.  Problem Solving Problem Solving: 5-Solves complex 90% of the time/cues < 10% of the time  Memory Memory: 5-Recognizes or recalls 90% of the time/requires cueing < 10% of the time  2. Anticoagulation/DVT prophylaxis with non-Pharmaceutical:  Medical Problem List and Plan:  1. 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 DVT Prophylaxis/Anticoagulation: Subcutaneous Lovenox initiated 10/31/2011. Monitor platelet counts and any signs of bleeding , repeat CBC normal 3. Mood:Sleep-wake cycles have normalized off ritalin  4. Neuropsych: This patient is  capable of making decisions on his/her own behalf at this time.He has  poor awareness of deficits and grossly overestimates his physical and cognitive abilities  5. Hypertension. controlled Norvasc 10 mg daily, clonidine patch 0.1 mg weekly, hydrochlorothiazide 25 mg daily, lisinopril 20 mg a.m. and 10 mg at bedtime. Monitor with increased mobility -Check BMET K+ 6. Dysphagia. Mechanical soft diet thin liquids. Speech therapy followup. Monitor for any signs of aspiration.  7.  Sensory dysesthesia on L side. ? Recovery of sensation vs onset of thalamic pain syndrome, trial gabapentin at night, may need daytime dose if progressive  LOS (Days) 41 A FACE TO FACE EVALUATION WAS PERFORMED  Laraine Samet E 12/25/2011, 7:30 AM

## 2011-12-26 DIAGNOSIS — G8110 Spastic hemiplegia affecting unspecified side: Secondary | ICD-10-CM

## 2011-12-26 DIAGNOSIS — Z5189 Encounter for other specified aftercare: Secondary | ICD-10-CM

## 2011-12-26 DIAGNOSIS — I619 Nontraumatic intracerebral hemorrhage, unspecified: Secondary | ICD-10-CM

## 2011-12-26 MED ORDER — GABAPENTIN 100 MG PO CAPS: 100.0000 mg | ORAL_CAPSULE | Freq: Every day | ORAL | Status: DC

## 2011-12-26 MED ORDER — ADULT MULTIVITAMIN W/MINERALS CH: 1.0000 | ORAL_TABLET | Freq: Every day | ORAL | Status: DC

## 2011-12-26 MED ORDER — LISINOPRIL 20 MG PO TABS: 20.0000 mg | ORAL_TABLET | Freq: Every day | ORAL | Status: DC

## 2011-12-26 MED ORDER — LISINOPRIL 10 MG PO TABS: 10.0000 mg | ORAL_TABLET | Freq: Every day | ORAL | Status: DC

## 2011-12-26 MED ORDER — HYDROCHLOROTHIAZIDE 25 MG PO TABS: 25.0000 mg | ORAL_TABLET | Freq: Every day | ORAL | Status: DC

## 2011-12-26 MED ORDER — AMLODIPINE BESYLATE 10 MG PO TABS: 10.0000 mg | ORAL_TABLET | Freq: Every day | ORAL | Status: DC

## 2011-12-26 MED ORDER — CLONIDINE HCL 0.1 MG/24HR TD PTWK: 0.1000 | MEDICATED_PATCH | TRANSDERMAL | Status: DC

## 2011-12-26 MED ORDER — POTASSIUM CHLORIDE CRYS ER 10 MEQ PO TBCR: 10.0000 meq | EXTENDED_RELEASE_TABLET | Freq: Every day | ORAL | Status: DC

## 2011-12-26 NOTE — Progress Notes (Signed)
Social Work Patient ID: Anthony Hardin, male   DOB: 1950-10-16, 61 y.o.   MRN: [id number] Have added SPT follow up therapy for OP. Contacted Dave-brother in-law and informed him of this. Still waiting to hear from MD 's office regarding PCP.

## 2011-12-26 NOTE — Progress Notes (Signed)
Social Work Discharge Note Discharge Note  The overall goal for the admission was met for:   Discharge location: Yes-HOME WITH 24 HOUR ASSISTANCE-PER FAMILY AND AGENCY  Length of Stay: Yes-42 DAYS  Discharge activity level: Yes-SUPERVISION/MIN LEVEL  Home/community participation: Yes  Services provided included: MD, RD, PT, OT, SLP, RN, TR, Pharmacy, Neuropsych and SW  Financial Services: Private Insurance: [place] BCBS  Follow-up services arranged: Outpatient: [facility]-PT, OT 10/17 8:30-9;45  10/22 11:15-12;00, DME: [facility]-WHEELCHAIR, ROLLING WALKER, TUB SEAT and Patient/Family has no preference for HH/DME agencies TRYING TO ARRANGE PCP FOR PT AWAITING RETURN CALL FROM [facility].  WILL INFORM PT ONCE HEAR FROM THEM.  Comments (or additional information):VERY SUPPORTIVE FAMILY AND FRIENDS, DID VERY WELL HERE.   Patient/Family verbalized understanding of follow-up arrangements: Yes  Individual responsible for coordination of the follow-up plan: SELF & SIBLINGS  Confirmed correct DME delivered: Lucy Chris 12/26/2011    Lucy Chris

## 2011-12-26 NOTE — Progress Notes (Signed)
Patient ID: Anthony Hardin, male   DOB: 1950-07-27, 61 y.o.   MRN: [id number] Admitted 10/31/2011 with diffuse weakness and unable to move and fell out of bed onto the floor. MRI of the brain showed a 4 x 1 x 2.5 cm acute hematoma right thalamus with mass effect and displacement distortion of the third ventricle but no suspicion of obstructive hydrocephalus. MRA of the head with no stenosis or occlusion.   Subjective/Complaints: Excited about D/C Review of Systems  Musculoskeletal: Negative for myalgias.  Neurological: Positive for sensory change and focal weakness.  All other systems reviewed and are negative.   Objective: Vital Signs: Blood pressure 127/82, pulse 61, temperature 98.3 F (36.8 C), temperature source Oral, resp. rate 19, height 5\' 9"  (1.753 m), weight 73.573 kg (162 lb 3.2 oz), SpO2 98.00%. No results found. Results for orders placed during the hospital encounter of 11/14/11 (from the past 72 hour(s))  BASIC METABOLIC PANEL     Status: Abnormal   Collection Time   12/25/11  9:14 AM      Component Value Range Comment   Sodium 136  135 - 145 mEq/L    Potassium 3.7  3.5 - 5.1 mEq/L    Chloride 97  96 - 112 mEq/L    CO2 27  19 - 32 mEq/L    Glucose, Bld 103 (*) 70 - 99 mg/dL    BUN 20  6 - 23 mg/dL    Creatinine, Ser 7.82  0.50 - 1.35 mg/dL    Calcium 95.6  8.4 - 10.5 mg/dL    GFR calc non Af Amer >90  >90 mL/min    GFR calc Af Amer >90  >90 mL/min      HEENT: normal Cardio: RRR Resp: CTA B/L GI: BS positive Extremity:  No Edema Skin:   Intact Neuro: Confused, Abnormal Sensory absent sensation L side lt touch and proprio, Abnormal Motor 2/5 finger flex and arm ext, 3/5 hip/knee extensory synergy,0/5 L Ankle Abnormal FMC Reflexes 3+, Reflexes: 3+, Tone:  Hypertonia,  Visual fields intact to confrontation testing Musc/Skel:  Normal  Sensation absent L arm and leg Assessment/Plan: 1. Functional deficits secondary to R thalamic bleed stable for discharge   FIM: FIM - Bathing Bathing Steps Patient Completed: Chest;Right Arm;Left Arm;Abdomen;Front perineal area;Buttocks;Right upper leg;Left upper leg;Right lower leg (including foot);Left lower leg (including foot) Bathing: 5: Supervision: Safety issues/verbal cues  FIM - Upper Body Dressing/Undressing Upper body dressing/undressing steps patient completed: Thread/unthread right sleeve of pullover shirt/dresss;Thread/unthread left sleeve of pullover shirt/dress;Put head through opening of pull over shirt/dress;Pull shirt over trunk Upper body dressing/undressing: 5: Supervision: Safety issues/verbal cues FIM - Lower Body Dressing/Undressing Lower body dressing/undressing steps patient completed: Thread/unthread right underwear leg;Thread/unthread left underwear leg;Pull underwear up/down;Thread/unthread right pants leg;Thread/unthread left pants leg;Pull pants up/down;Don/Doff right shoe;Don/Doff left shoe;Fasten/unfasten right shoe;Fasten/unfasten left shoe Lower body dressing/undressing: 5: Set-up assist to: Don/Doff TED stocking  FIM - Toileting Toileting steps completed by patient: Adjust clothing prior to toileting;Performs perineal hygiene;Adjust clothing after toileting Toileting Assistive Devices: Grab bar or rail for support Toileting: 4: Steadying assist  FIM - Diplomatic Services operational officer Devices: Grab bars Toilet Transfers: 5-To toilet/BSC: Supervision (verbal cues/safety issues);5-From toilet/BSC: Supervision (verbal cues/safety issues)  FIM - Bed/Chair Transfer Bed/Chair Transfer Assistive Devices: Therapist, occupational: 5: Chair or W/C > Bed: Supervision (verbal cues/safety issues);5: Bed > Chair or W/C: Supervision (verbal cues/safety issues)  FIM - Locomotion: Wheelchair Distance: 150 Locomotion: Wheelchair: 5: Travels 150 ft or more:  maneuvers on rugs and over door sills with supervision, cueing or coaxing FIM - Locomotion: Ambulation Locomotion:  Ambulation Assistive Devices: Walker - Rolling Ambulation/Gait Assistance: 5: Supervision Locomotion: Ambulation: 5: Travels 150 ft or more with supervision/safety issues  Comprehension Comprehension Mode: Auditory Comprehension: 5-Understands basic 90% of the time/requires cueing < 10% of the time  Expression Expression Mode: Verbal Expression: 5-Expresses complex 90% of the time/cues < 10% of the time  Social Interaction Social Interaction: 5-Interacts appropriately 90% of the time - Needs monitoring or encouragement for participation or interaction.  Problem Solving Problem Solving: 5-Solves basic 90% of the time/requires cueing < 10% of the time  Memory Memory: 5-Recognizes or recalls 90% of the time/requires cueing < 10% of the time  2. Anticoagulation/DVT prophylaxis with non-Pharmaceutical:  Medical Problem List and Plan:  1. 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 DVT Prophylaxis/Anticoagulation: Subcutaneous Lovenox initiated 10/31/2011. Monitor platelet counts and any signs of bleeding , repeat CBC normal 3. Mood:Sleep-wake cycles have normalized off ritalin  4. Neuropsych: This patient is  capable of making decisions on his/her own behalf at this time.He has  poor awareness of deficits and grossly overestimates his physical and cognitive abilities  5. Hypertension. controlled Norvasc 10 mg daily, clonidine patch 0.1 mg weekly, hydrochlorothiazide 25 mg daily, lisinopril 20 mg a.m. and 10 mg at bedtime. Monitor with increased mobility - BMET K+ normal 6. Dysphagia. Mechanical soft diet thin liquids. Speech therapy followup. Monitor for any signs of aspiration.  7.  Sensory dysesthesia on L side. ? Recovery of sensation vs onset of thalamic pain syndrome,  gabapentin at night,   LOS (Days) 42 A FACE TO FACE EVALUATION WAS PERFORMED  KIRSTEINS,ANDREW E 12/26/2011, 8:18 AM

## 2011-12-27 NOTE — Progress Notes (Signed)
Pt discharged home with brother-n-law via private vehicle, Harvel Ricks PA provided discharge instructions, prescriptions given to patient. Patient verbalized an understanding and denied any questions and concerns

## 2011-12-28 ENCOUNTER — Ambulatory Visit: Payer: BC Managed Care – PPO | Attending: Physical Medicine & Rehabilitation | Admitting: *Deleted

## 2011-12-28 ENCOUNTER — Ambulatory Visit: Payer: BC Managed Care – PPO | Admitting: Speech Pathology

## 2011-12-28 DIAGNOSIS — R279 Unspecified lack of coordination: Secondary | ICD-10-CM | POA: Insufficient documentation

## 2011-12-28 DIAGNOSIS — R41842 Visuospatial deficit: Secondary | ICD-10-CM | POA: Insufficient documentation

## 2011-12-28 DIAGNOSIS — M6281 Muscle weakness (generalized): Secondary | ICD-10-CM | POA: Insufficient documentation

## 2011-12-28 DIAGNOSIS — I69998 Other sequelae following unspecified cerebrovascular disease: Secondary | ICD-10-CM | POA: Insufficient documentation

## 2011-12-28 DIAGNOSIS — R269 Unspecified abnormalities of gait and mobility: Secondary | ICD-10-CM | POA: Insufficient documentation

## 2011-12-28 DIAGNOSIS — I69919 Unspecified symptoms and signs involving cognitive functions following unspecified cerebrovascular disease: Secondary | ICD-10-CM | POA: Insufficient documentation

## 2011-12-28 DIAGNOSIS — Z5189 Encounter for other specified aftercare: Secondary | ICD-10-CM | POA: Insufficient documentation

## 2012-01-01 ENCOUNTER — Ambulatory Visit: Payer: BC Managed Care – PPO | Admitting: Rehabilitative and Restorative Service Providers"

## 2012-01-01 ENCOUNTER — Ambulatory Visit: Payer: BC Managed Care – PPO | Admitting: Speech Pathology

## 2012-01-02 ENCOUNTER — Telehealth: Payer: Self-pay | Admitting: Physical Medicine & Rehabilitation

## 2012-01-02 ENCOUNTER — Ambulatory Visit: Payer: BC Managed Care – PPO | Admitting: *Deleted

## 2012-01-02 NOTE — Telephone Encounter (Signed)
FYI - when patient d/c'd from CIR last week it was recommended he have 24/7 supervision.  Patient presented today for evaluation and stated he has been at home alone for the past few days.  [facility] there up until last week.  Please advise.

## 2012-01-03 NOTE — Telephone Encounter (Signed)
Spoke with Arline Asp about situation.  She has called [facility]? Regarding this situation.  Patient has hired a Hospital doctor to get to rehab.  Will call patient and advise him to contact [facility] again to get home health care.  Patient says he is doing better.  He did have [facility] there over the weekend.  He has worked out a others to stay with him at this time.

## 2012-01-04 ENCOUNTER — Ambulatory Visit: Payer: BC Managed Care – PPO | Admitting: *Deleted

## 2012-01-08 ENCOUNTER — Encounter: Payer: Self-pay | Admitting: Physical Medicine & Rehabilitation

## 2012-01-08 ENCOUNTER — Ambulatory Visit (HOSPITAL_BASED_OUTPATIENT_CLINIC_OR_DEPARTMENT_OTHER): Payer: BC Managed Care – PPO | Admitting: Physical Medicine & Rehabilitation

## 2012-01-08 ENCOUNTER — Encounter: Payer: BC Managed Care – PPO | Attending: Physical Medicine & Rehabilitation

## 2012-01-08 VITALS — BP 123/68 | HR 79 | Resp 14 | Ht 69.0 in | Wt 163.0 lb

## 2012-01-08 DIAGNOSIS — I69959 Hemiplegia and hemiparesis following unspecified cerebrovascular disease affecting unspecified side: Secondary | ICD-10-CM | POA: Insufficient documentation

## 2012-01-08 DIAGNOSIS — I610 Nontraumatic intracerebral hemorrhage in hemisphere, subcortical: Secondary | ICD-10-CM

## 2012-01-08 DIAGNOSIS — G8190 Hemiplegia, unspecified affecting unspecified side: Secondary | ICD-10-CM

## 2012-01-08 DIAGNOSIS — I69993 Ataxia following unspecified cerebrovascular disease: Secondary | ICD-10-CM | POA: Insufficient documentation

## 2012-01-08 DIAGNOSIS — I619 Nontraumatic intracerebral hemorrhage, unspecified: Secondary | ICD-10-CM

## 2012-01-08 NOTE — Progress Notes (Signed)
Subjective:    Patient ID: Anthony Hardin, male    DOB: 1951-01-19, 61 y.o.   MRN: [id number]  HPI This is a 61 year old right-handed male with history of hypertension,  who is employed as a Chief Executive Officer at [employer]. Admitted  October 31, 2011 with diffuse weakness and unable to move, fell out of  his bed onto the floor. MRI of the brain showed a 4 x 1 cm x 2.5 cm  acute hematoma, right thalamus with mass effect, displacement,  distortion of the 3rd ventricle but no suspicion of obstructive  hydrocephalus. MRA of the head with no stenosis or occlusion. Close  monitoring of blood pressure with intravenous Cardene that was slowly  weaned and transitioned to by mouth for his medications. Neurosurgery  Dr. Marikay Alar consulted, advised conservative care with followup  cranial CT scans. Latest cranial CT scan November 04, 2011 showed stable  left hemorrhage involving right thalamus with stable surrounding  vasogenic edema. Noted bouts of diplopia as well as left inattention.  Trial of Ritalin was added November 11, 2011 to enable the patient to  attend better to tasks and later discontinued.   Patient was discharged from inpatient rehabilitation 12/26/2011. He has started outpatient PT OT and speech therapy  No falls, No near falls No seizures No new medical problems  Pain Inventory Average Pain 0 Pain Right Now 0 My pain is no pain  In the last 24 hours, has pain interfered with the following? General activity 0 Relation with others 0 Enjoyment of life 0 What TIME of day is your pain at its worst? no pain Sleep (in general) Good  Pain is worse with: no pain Pain improves with: no pain Relief from Meds: no pain  Mobility use a walker how many minutes can you walk? 5 ability to climb steps?  yes do you drive?  yes  Function disabled: date disabled   Neuro/Psych weakness numbness tingling trouble walking  Prior Studies Any changes since last visit?   no  Physicians involved in your care Any changes since last visit?  no   Family History  Problem Relation Age of Onset  . Hypertension Mother   . Hypertension Father   . Stroke Paternal Aunt   . Anuerysm Paternal Grandfather    History   Social History  . Marital Status: Single    Spouse Name: N/A    Number of Children: N/A  . Years of Education: N/A   Social History Main Topics  . Smoking status: Never Smoker   . Smokeless tobacco: Never Used  . Alcohol Use: Yes  . Drug Use: No  . Sexually Active:    Other Topics Concern  . None   Social History Narrative  . None   History reviewed. No pertinent past surgical history. Past Medical History  Diagnosis Date  . Hypertension   . Stroke    BP 123/68  Pulse 79  Resp 14  Ht 5\' 9"  (1.753 m)  Wt 163 lb (73.936 kg)  BMI 24.07 kg/m2  SpO2 96%    Review of Systems  Musculoskeletal: Positive for gait problem.  Neurological: Positive for weakness and numbness.       Tingling  All other systems reviewed and are negative.       Objective:   Physical Exam  Constitutional: He is oriented to person, place, and time. He appears well-developed and well-nourished.  HENT:  Head: Normocephalic and atraumatic.  Eyes: Conjunctivae normal and EOM are normal.  Pupils are equal, round, and reactive to light.  Neck: Normal range of motion.  Neurological: He is alert and oriented to person, place, and time. He displays no atrophy. A sensory deficit is present. He exhibits abnormal muscle tone. Coordination and gait abnormal.  Reflex Scores:      Tricep reflexes are 3+ on the right side and 3+ on the left side.      Bicep reflexes are 3+ on the right side and 3+ on the left side.      Brachioradialis reflexes are 3+ on the right side and 3+ on the left side.      Patellar reflexes are 3+ on the right side and 3+ on the left side.      Achilles reflexes are 3+ on the right side and 3+ on the left side.      Absent sensation to  light touch proprioception in the left Upper and left lower extremity. Exception is left great toe which has partial sensation to pinprick and light touch but absent proprioception Motor strength is 3 minus at the left deltoid, biceps, triceps, grip, finger extensors 4 minus at the left hip flexor knee extensor 3 minus at the left ankle dorsiflexor and plantar flexor Sensory ataxia left upper extremity  Psychiatric: He has a normal mood and affect.          Assessment & Plan:  1. Right thalamic hemorrhage with left hemiparesis, dense left hemisensory deficits, sensory ataxia, as well as a resolving left neglect. We discussed him staying alone for short periods of time. He states that he does not dress bathe or do any menial preparation without someone else in the house. He does have higher to help that come by every day. He will have his nephew staying with him full-time starting November 1. He has been instructed not to drive and has not driven since his stroke Continue outpatient PT OT and speech therapy

## 2012-01-08 NOTE — Patient Instructions (Addendum)
No driving No bathing by your self No cooking

## 2012-01-09 ENCOUNTER — Ambulatory Visit: Payer: BC Managed Care – PPO | Admitting: Physical Therapy

## 2012-01-09 ENCOUNTER — Ambulatory Visit: Payer: BC Managed Care – PPO | Admitting: Occupational Therapy

## 2012-01-11 ENCOUNTER — Ambulatory Visit: Payer: BC Managed Care – PPO | Admitting: Speech Pathology

## 2012-01-11 ENCOUNTER — Ambulatory Visit: Payer: BC Managed Care – PPO | Admitting: *Deleted

## 2012-01-11 ENCOUNTER — Ambulatory Visit: Payer: BC Managed Care – PPO | Admitting: Occupational Therapy

## 2012-01-15 ENCOUNTER — Ambulatory Visit: Payer: BC Managed Care – PPO | Admitting: Speech Pathology

## 2012-01-15 ENCOUNTER — Ambulatory Visit: Payer: BC Managed Care – PPO | Admitting: *Deleted

## 2012-01-15 ENCOUNTER — Encounter: Payer: BC Managed Care – PPO | Admitting: Occupational Therapy

## 2012-01-15 ENCOUNTER — Ambulatory Visit: Payer: BC Managed Care – PPO | Admitting: Rehabilitative and Restorative Service Providers"

## 2012-01-15 ENCOUNTER — Encounter: Payer: BC Managed Care – PPO | Admitting: Speech Pathology

## 2012-01-15 ENCOUNTER — Ambulatory Visit: Payer: BC Managed Care – PPO | Attending: Physical Medicine & Rehabilitation | Admitting: Occupational Therapy

## 2012-01-15 DIAGNOSIS — I69998 Other sequelae following unspecified cerebrovascular disease: Secondary | ICD-10-CM | POA: Insufficient documentation

## 2012-01-15 DIAGNOSIS — I69919 Unspecified symptoms and signs involving cognitive functions following unspecified cerebrovascular disease: Secondary | ICD-10-CM | POA: Insufficient documentation

## 2012-01-15 DIAGNOSIS — M6281 Muscle weakness (generalized): Secondary | ICD-10-CM | POA: Insufficient documentation

## 2012-01-15 DIAGNOSIS — R279 Unspecified lack of coordination: Secondary | ICD-10-CM | POA: Insufficient documentation

## 2012-01-15 DIAGNOSIS — Z5189 Encounter for other specified aftercare: Secondary | ICD-10-CM | POA: Insufficient documentation

## 2012-01-17 ENCOUNTER — Ambulatory Visit: Payer: BC Managed Care – PPO | Admitting: *Deleted

## 2012-01-18 ENCOUNTER — Ambulatory Visit: Payer: BC Managed Care – PPO | Admitting: Speech Pathology

## 2012-01-18 ENCOUNTER — Ambulatory Visit: Payer: BC Managed Care – PPO | Admitting: *Deleted

## 2012-01-18 ENCOUNTER — Ambulatory Visit: Payer: BC Managed Care – PPO | Admitting: Occupational Therapy

## 2012-01-22 ENCOUNTER — Ambulatory Visit: Payer: BC Managed Care – PPO | Admitting: Occupational Therapy

## 2012-01-22 ENCOUNTER — Encounter: Payer: BC Managed Care – PPO | Admitting: Speech Pathology

## 2012-01-22 ENCOUNTER — Ambulatory Visit: Payer: BC Managed Care – PPO | Admitting: *Deleted

## 2012-01-24 ENCOUNTER — Ambulatory Visit: Payer: BC Managed Care – PPO | Admitting: Speech Pathology

## 2012-01-24 ENCOUNTER — Ambulatory Visit: Payer: BC Managed Care – PPO | Admitting: *Deleted

## 2012-01-24 ENCOUNTER — Ambulatory Visit: Payer: BC Managed Care – PPO | Admitting: Occupational Therapy

## 2012-01-26 ENCOUNTER — Other Ambulatory Visit: Payer: Self-pay | Admitting: *Deleted

## 2012-01-26 ENCOUNTER — Ambulatory Visit: Payer: BC Managed Care – PPO | Admitting: *Deleted

## 2012-01-26 ENCOUNTER — Encounter: Payer: BC Managed Care – PPO | Admitting: Occupational Therapy

## 2012-01-29 ENCOUNTER — Ambulatory Visit: Payer: BC Managed Care – PPO | Admitting: Occupational Therapy

## 2012-01-29 ENCOUNTER — Telehealth: Payer: Self-pay | Admitting: *Deleted

## 2012-01-29 ENCOUNTER — Ambulatory Visit: Payer: BC Managed Care – PPO | Admitting: Physical Therapy

## 2012-01-29 ENCOUNTER — Ambulatory Visit: Payer: BC Managed Care – PPO | Admitting: Speech Pathology

## 2012-01-29 ENCOUNTER — Ambulatory Visit: Payer: BC Managed Care – PPO | Admitting: Rehabilitative and Restorative Service Providers"

## 2012-01-29 NOTE — Telephone Encounter (Signed)
Faxed

## 2012-01-29 NOTE — Telephone Encounter (Signed)
Diagnosis right thalamic hemorrhage with left hemiparesis and left visual perceptual neglect PT eval and treat OT eval and treat Speech therapy eval and treat, no swallowing problems, higher level cognitive eval

## 2012-01-29 NOTE — Telephone Encounter (Signed)
Ann called  From [facility] in [place]. Mr Braxten Memmer is going to be in [place] for a month and they need orders faxed to them to treat while he is there.  Fx # [phone]  What do we need to order?

## 2012-01-31 ENCOUNTER — Ambulatory Visit: Payer: BC Managed Care – PPO | Admitting: Occupational Therapy

## 2012-01-31 ENCOUNTER — Ambulatory Visit: Payer: BC Managed Care – PPO | Admitting: *Deleted

## 2012-02-01 ENCOUNTER — Encounter: Payer: BC Managed Care – PPO | Admitting: *Deleted

## 2012-02-01 ENCOUNTER — Ambulatory Visit: Payer: BC Managed Care – PPO | Admitting: Physical Therapy

## 2012-02-02 ENCOUNTER — Encounter: Payer: BC Managed Care – PPO | Attending: Physical Medicine & Rehabilitation

## 2012-02-02 ENCOUNTER — Ambulatory Visit (HOSPITAL_BASED_OUTPATIENT_CLINIC_OR_DEPARTMENT_OTHER): Payer: BC Managed Care – PPO | Admitting: Physical Medicine & Rehabilitation

## 2012-02-02 ENCOUNTER — Encounter: Payer: BC Managed Care – PPO | Admitting: Occupational Therapy

## 2012-02-02 ENCOUNTER — Ambulatory Visit: Payer: BC Managed Care – PPO | Admitting: Physical Therapy

## 2012-02-02 ENCOUNTER — Encounter: Payer: Self-pay | Admitting: Physical Medicine & Rehabilitation

## 2012-02-02 VITALS — BP 148/84 | HR 62 | Resp 14 | Ht 69.0 in | Wt 168.0 lb

## 2012-02-02 DIAGNOSIS — I69993 Ataxia following unspecified cerebrovascular disease: Secondary | ICD-10-CM | POA: Insufficient documentation

## 2012-02-02 DIAGNOSIS — R209 Unspecified disturbances of skin sensation: Secondary | ICD-10-CM

## 2012-02-02 DIAGNOSIS — I69959 Hemiplegia and hemiparesis following unspecified cerebrovascular disease affecting unspecified side: Secondary | ICD-10-CM | POA: Insufficient documentation

## 2012-02-02 DIAGNOSIS — I610 Nontraumatic intracerebral hemorrhage in hemisphere, subcortical: Secondary | ICD-10-CM

## 2012-02-02 DIAGNOSIS — G8190 Hemiplegia, unspecified affecting unspecified side: Secondary | ICD-10-CM

## 2012-02-02 DIAGNOSIS — I619 Nontraumatic intracerebral hemorrhage, unspecified: Secondary | ICD-10-CM

## 2012-02-02 DIAGNOSIS — I69998 Other sequelae following unspecified cerebrovascular disease: Secondary | ICD-10-CM

## 2012-02-02 NOTE — Progress Notes (Signed)
Subjective:    Patient ID: Anthony Hardin, male    DOB: [birth year], 61 y.o.   MRN: [id number] 61 year old right-handed male with history of hypertension,  who is employed as a Chief Executive Officer at [employer]. Admitted  October 31, 2011 with diffuse weakness and unable to move, fell out of  his bed onto the floor. MRI of the brain showed a 4 x 1 cm x 2.5 cm  acute hematoma, right thalamus with mass effect, displacement,  distortion of the 3rd ventricle but no suspicion of obstructive  hydrocephalus. MRA of the head with no stenosis or occlusion. Close  monitoring of blood pressure with intravenous Cardene that was slowly  weaned and transitioned to by mouth for his medications. HPI Increased oral sensation on Left Attributes to estim use by SLP No falls No seizures Frontal HA minor at end of day Pain Inventory Average Pain 0 Pain Right Now 0 My pain is headache  In the last 24 hours, has pain interfered with the following? General activity 5 Relation with others 5 Enjoyment of life 5 What TIME of day is your pain at its worst? n/a Sleep (in general) Fair  Pain is worse with: unsure Pain improves with: pacing activities Relief from Meds: 10  Mobility use a walker how many minutes can you walk? not sure ability to climb steps?  yes do you drive?  no transfers alone Do you have any goals in this area?  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  yes  Neuro/Psych numbness tingling  Prior Studies Any changes since last visit?  no  Physicians involved in your care Any changes since last visit?  no   Family History  Problem Relation Age of Onset  . Hypertension Mother   . Hypertension Father   . Stroke Paternal Aunt   . Anuerysm Paternal Grandfather    History   Social History  . Marital  Status: Single    Spouse Name: N/A    Number of Children: N/A  . Years of Education: N/A   Social History Main Topics  . Smoking status: Never Smoker   . Smokeless tobacco: Never Used  . Alcohol Use: Yes  . Drug Use: No  . Sexually Active:    Other Topics Concern  . None   Social History Narrative  . None   History reviewed. No pertinent past surgical history. Past Medical History  Diagnosis Date  . Hypertension   . Stroke    BP 148/84  Pulse 62  Resp 14  Ht 5\' 9"  (1.753 m)  Wt 168 lb (76.204 kg)  BMI 24.81 kg/m2  SpO2 100%     Review of Systems  Neurological: Positive for numbness.  All other systems reviewed and are negative.       Objective:   Physical Exam Constitutional: He is oriented to person, place, and time. He appears well-developed and well-nourished.  HENT:  Head: Normocephalic and atraumatic.  Eyes: Conjunctivae normal and EOM are normal. Pupils are equal, round, and reactive to light.  Neck: Normal range of motion.  Neurological: He is alert and oriented to person, place, and time. He displays no atrophy. A sensory deficit is present. He exhibits abnormal muscle tone. Coordination and gait abnormal.  Ambulation: Mild knee hyperextension with good ankle control using anterior AFO Moderate left knee  hyperextension with poor ankle control mild to moderate inversion at the ankle without AFO Absent sensation to light touch proprioception in the left Upper and left lower extremity. Exception is left great toe which has partial sensation to pinprick and light touch but absent proprioception Motor strength is 4 minus at the left deltoid, biceps, triceps, grip, finger extensors 4 minus at the left hip flexor knee extensor 3 minus at the left ankle dorsiflexor and plantar flexor Sensory ataxia left upper extremity  Psychiatric: He has a normal mood and affect.     Assessment & Plan:  1. Right thalamic hemorrhage with left hemiparesis, dense left  hemisensory deficits, sensory ataxia, as well as a resolving left neglect. He is able to stay home on his own now He has been instructed not to drive and has not driven since his stroke  He plans to return to work on a limited basis working from home starting in January. I asked him to discuss this with his speech therapist. Continue outpatient PT 3 x wk OT  and speech therapy, will be going to [place] for the holidays and will return to continue therapy.  Pt will be back on 12/28.

## 2012-02-02 NOTE — Patient Instructions (Signed)
Call for therapy orders mid Dec Call Dr Kevan Ny for PCP appt May try transcutaneous electrical stim for nerve pain in LUE and LLE

## 2012-02-20 ENCOUNTER — Telehealth: Payer: Self-pay

## 2012-02-20 MED ORDER — LISINOPRIL 10 MG PO TABS: 10.0000 mg | ORAL_TABLET | Freq: Every day | ORAL | Status: DC

## 2012-02-20 MED ORDER — CLONIDINE HCL 0.1 MG/24HR TD PTWK: 0.1000 | MEDICATED_PATCH | TRANSDERMAL | Status: DC

## 2012-02-20 MED ORDER — HYDROCHLOROTHIAZIDE 25 MG PO TABS: 25.0000 mg | ORAL_TABLET | Freq: Every day | ORAL | Status: DC

## 2012-02-20 MED ORDER — AMLODIPINE BESYLATE 10 MG PO TABS: 10.0000 mg | ORAL_TABLET | Freq: Every day | ORAL | Status: DC

## 2012-02-20 MED ORDER — POTASSIUM CHLORIDE CRYS ER 10 MEQ PO TBCR: 10.0000 meq | EXTENDED_RELEASE_TABLET | Freq: Every day | ORAL | Status: DC

## 2012-02-20 MED ORDER — LISINOPRIL 20 MG PO TABS: 20.0000 mg | ORAL_TABLET | Freq: Every day | ORAL | Status: DC

## 2012-02-20 NOTE — Telephone Encounter (Signed)
Patient called for refills.  Lisinopril 10 and 20mg , amlodipine, hctz, clonidine patch, klor-con and stool softener.  [facility] corner of [address].  According to patient he will not get in to see his primary care before he will be out of his medication.  Patient says he had discussed this at his last appointment.  Please advise.

## 2012-02-20 NOTE — Telephone Encounter (Signed)
Done Instruct patient to followup with primary physician for all further refills

## 2012-02-21 ENCOUNTER — Other Ambulatory Visit: Payer: Self-pay | Admitting: Physical Medicine & Rehabilitation

## 2012-02-21 MED ORDER — LISINOPRIL 20 MG PO TABS: 20.0000 mg | ORAL_TABLET | Freq: Every day | ORAL | Status: DC

## 2012-02-21 MED ORDER — CLONIDINE HCL 0.1 MG/24HR TD PTWK: 0.1000 | MEDICATED_PATCH | TRANSDERMAL | Status: DC

## 2012-02-21 MED ORDER — HYDROCHLOROTHIAZIDE 25 MG PO TABS: 25.0000 mg | ORAL_TABLET | Freq: Every day | ORAL | Status: AC

## 2012-02-21 MED ORDER — POTASSIUM CHLORIDE CRYS ER 10 MEQ PO TBCR: 10.0000 meq | EXTENDED_RELEASE_TABLET | Freq: Every day | ORAL | Status: DC

## 2012-02-21 MED ORDER — AMLODIPINE BESYLATE 10 MG PO TABS: 10.0000 mg | ORAL_TABLET | Freq: Every day | ORAL | Status: DC

## 2012-02-21 MED ORDER — LISINOPRIL 10 MG PO TABS: 10.0000 mg | ORAL_TABLET | Freq: Every day | ORAL | Status: DC

## 2012-02-21 NOTE — Telephone Encounter (Signed)
Refills electronically sent to pharamcy.

## 2012-02-27 ENCOUNTER — Telehealth: Payer: Self-pay | Admitting: *Deleted

## 2012-02-27 DIAGNOSIS — I619 Nontraumatic intracerebral hemorrhage, unspecified: Secondary | ICD-10-CM

## 2012-02-27 DIAGNOSIS — I69998 Other sequelae following unspecified cerebrovascular disease: Secondary | ICD-10-CM

## 2012-02-27 DIAGNOSIS — G8190 Hemiplegia, unspecified affecting unspecified side: Secondary | ICD-10-CM

## 2012-02-27 NOTE — Telephone Encounter (Signed)
Patient needs referral to start PT at [facility] in January. He is currently in [place] but will be returning to [place] after the [place].

## 2012-02-29 ENCOUNTER — Telehealth: Payer: Self-pay | Admitting: *Deleted

## 2012-02-29 NOTE — Telephone Encounter (Signed)
Patient was calling to check in on orders for PT. Notified patient that PT orders were placed on 12/17.

## 2012-03-04 ENCOUNTER — Telehealth: Payer: Self-pay | Admitting: *Deleted

## 2012-03-04 NOTE — Telephone Encounter (Signed)
Called and spoke with Angie at [facility] neuro [facility]hab. Notified her that referral was updated with Dr. Wynn Banker name.

## 2012-03-04 NOTE — Telephone Encounter (Signed)
Angie at [facility] needs Dr. Wynn Banker name added to patients PT referral. Somehow Dr. Wynn Banker name is not on the referral.

## 2012-03-19 ENCOUNTER — Ambulatory Visit: Payer: BC Managed Care – PPO | Attending: Physical Medicine & Rehabilitation | Admitting: Occupational Therapy

## 2012-03-19 ENCOUNTER — Ambulatory Visit: Payer: BC Managed Care – PPO | Admitting: Physical Therapy

## 2012-03-19 DIAGNOSIS — R41842 Visuospatial deficit: Secondary | ICD-10-CM | POA: Insufficient documentation

## 2012-03-19 DIAGNOSIS — I69998 Other sequelae following unspecified cerebrovascular disease: Secondary | ICD-10-CM | POA: Insufficient documentation

## 2012-03-19 DIAGNOSIS — M6281 Muscle weakness (generalized): Secondary | ICD-10-CM | POA: Insufficient documentation

## 2012-03-19 DIAGNOSIS — I69919 Unspecified symptoms and signs involving cognitive functions following unspecified cerebrovascular disease: Secondary | ICD-10-CM | POA: Insufficient documentation

## 2012-03-19 DIAGNOSIS — Z5189 Encounter for other specified aftercare: Secondary | ICD-10-CM | POA: Insufficient documentation

## 2012-03-19 DIAGNOSIS — R279 Unspecified lack of coordination: Secondary | ICD-10-CM | POA: Insufficient documentation

## 2012-03-19 DIAGNOSIS — R269 Unspecified abnormalities of gait and mobility: Secondary | ICD-10-CM | POA: Insufficient documentation

## 2012-03-22 ENCOUNTER — Ambulatory Visit (HOSPITAL_BASED_OUTPATIENT_CLINIC_OR_DEPARTMENT_OTHER): Payer: BC Managed Care – PPO | Admitting: Physical Medicine & Rehabilitation

## 2012-03-22 ENCOUNTER — Encounter: Payer: Self-pay | Admitting: Physical Medicine & Rehabilitation

## 2012-03-22 ENCOUNTER — Encounter: Payer: BC Managed Care – PPO | Attending: Physical Medicine & Rehabilitation

## 2012-03-22 VITALS — BP 169/73 | HR 83 | Resp 14 | Ht 69.0 in | Wt 172.8 lb

## 2012-03-22 DIAGNOSIS — I69993 Ataxia following unspecified cerebrovascular disease: Secondary | ICD-10-CM | POA: Insufficient documentation

## 2012-03-22 DIAGNOSIS — I610 Nontraumatic intracerebral hemorrhage in hemisphere, subcortical: Secondary | ICD-10-CM

## 2012-03-22 DIAGNOSIS — I619 Nontraumatic intracerebral hemorrhage, unspecified: Secondary | ICD-10-CM

## 2012-03-22 DIAGNOSIS — I69998 Other sequelae following unspecified cerebrovascular disease: Secondary | ICD-10-CM | POA: Insufficient documentation

## 2012-03-22 DIAGNOSIS — G8190 Hemiplegia, unspecified affecting unspecified side: Secondary | ICD-10-CM

## 2012-03-22 DIAGNOSIS — I69959 Hemiplegia and hemiparesis following unspecified cerebrovascular disease affecting unspecified side: Secondary | ICD-10-CM | POA: Insufficient documentation

## 2012-03-22 DIAGNOSIS — R209 Unspecified disturbances of skin sensation: Secondary | ICD-10-CM

## 2012-03-22 MED ORDER — AMLODIPINE BESYLATE 10 MG PO TABS: 10.0000 mg | ORAL_TABLET | Freq: Every day | ORAL | Status: DC

## 2012-03-22 NOTE — Patient Instructions (Signed)
No driving unless cleared by opthalmologist Dr Karleen Hampshire Neuro Opthalmologist

## 2012-03-22 NOTE — Progress Notes (Signed)
Subjective:    Patient ID: Anthony Hardin, male    DOB: [birth year], 62 y.o.   MRN: [id number]  HPI 62 year old right-handed male with history of hypertension,  who is employed as a Chief Executive Officer at [employer]. Admitted  October 31, 2011 with diffuse weakness and unable to move, fell out of  his bed onto the floor. MRI of the brain showed a 4 x 1 cm x 2.5 cm  acute hematoma, right thalamus with mass effect, displacement,  distortion of the 3rd ventricle but no suspicion of obstructive  hydrocephalus. MRA of the head with no stenosis or occlusion. Close  monitoring of blood pressure with intravenous Cardene that was slowly  weaned and transitioned to by mouth for his medications.  Not using Left AFO Starting to type, now gripping door knobs  Pain Inventory Average Pain 1 Pain Right Now 0 My pain is tingling  In the last 24 hours, has pain interfered with the following? General activity 0 Relation with others 0 Enjoyment of life 0 What TIME of day is your pain at its worst? night Sleep (in general) Good  Pain is worse with: unsure Pain improves with: unsure Relief from Meds: not having any pain  Mobility use a cane how many minutes can you walk? 20 ability to climb steps?  yes do you drive?  no transfers alone Do you have any goals in this area?  yes  Function employed # of hrs/week 40 I need assistance with the following:  household duties and shopping Do you have any goals in this area?  yes  Neuro/Psych No problems in this area  Prior Studies Any changes since last visit?  no  Physicians involved in your care Any changes since last visit?  no   Family History  Problem Relation Age of Onset  . Hypertension Mother   . Hypertension Father   . Stroke Paternal Aunt   . Anuerysm Paternal Grandfather    History   Social History  . Marital Status: Single    Spouse Name: N/A    Number of Children: N/A  . Years of Education: N/A   Social  History Main Topics  . Smoking status: Never Smoker   . Smokeless tobacco: Never Used  . Alcohol Use: Yes  . Drug Use: No  . Sexually Active:    Other Topics Concern  . None   Social History Narrative  . None   History reviewed. No pertinent past surgical history. Past Medical History  Diagnosis Date  . Hypertension   . Stroke    BP 169/73  Pulse 83  Resp 14  Ht 5\' 9"  (1.753 m)  Wt 172 lb 12.8 oz (78.382 kg)  BMI 25.52 kg/m2  SpO2 94%    Review of Systems  Musculoskeletal: Positive for gait problem.       Objective:   Physical Exam  Neurological: He is alert and oriented to person, place, and time. He displays no atrophy. A sensory deficit is present. He exhibits abnormal muscle tone. Coordination and gait abnormal.  Ambulation: Mild knee hyperextension with good ankle control using anterior AFO  Moderate left knee hyperextension with poor ankle control mild to moderate inversion at the ankle without AFO Reduced sensation to light touch proprioception in the left 2nd to 5th digits absent in thumb and left lower extremity. Exception is left great toe which has partial sensation to pinprick and light touch but absent proprioception Motor strength is 4 minus at the left deltoid,  biceps, triceps, grip, finger extensors 4 minus at the left hip flexor knee extensor 3 minus at the left ankle dorsiflexor and plantar flexor Sensory ataxia left upper extremity  Psychiatric: He has a normal mood and affect.  Tactile extinction No visual extinction    Assessment & Plan:  1. Right thalamic hemorrhage with left hemiparesis, dense left hemisensory deficits, sensory ataxia, as well as a resolving left neglect. He is able to stay home on his own now He has been instructed not to drive and has not driven since his stroke  He plans to return to work on a limited basis working from home starting in January. I asked him to discuss this with his speech therapist.  Continue outpatient PT  3 x wk OT and speech therapy,

## 2012-03-26 ENCOUNTER — Ambulatory Visit: Payer: BC Managed Care – PPO | Admitting: *Deleted

## 2012-03-26 ENCOUNTER — Ambulatory Visit: Payer: BC Managed Care – PPO | Admitting: Physical Therapy

## 2012-03-29 ENCOUNTER — Ambulatory Visit: Payer: BC Managed Care – PPO | Admitting: *Deleted

## 2012-03-29 ENCOUNTER — Ambulatory Visit: Payer: BC Managed Care – PPO | Admitting: Physical Therapy

## 2012-04-02 ENCOUNTER — Ambulatory Visit: Payer: BC Managed Care – PPO | Admitting: Physical Therapy

## 2012-04-02 ENCOUNTER — Ambulatory Visit: Payer: BC Managed Care – PPO | Admitting: *Deleted

## 2012-04-04 ENCOUNTER — Ambulatory Visit: Payer: BC Managed Care – PPO | Admitting: Physical Therapy

## 2012-04-04 ENCOUNTER — Ambulatory Visit: Payer: BC Managed Care – PPO | Admitting: *Deleted

## 2012-04-09 ENCOUNTER — Ambulatory Visit: Payer: BC Managed Care – PPO | Admitting: Occupational Therapy

## 2012-04-09 ENCOUNTER — Ambulatory Visit: Payer: BC Managed Care – PPO | Admitting: Physical Therapy

## 2012-04-11 ENCOUNTER — Ambulatory Visit: Payer: BC Managed Care – PPO | Admitting: Occupational Therapy

## 2012-04-11 ENCOUNTER — Ambulatory Visit: Payer: BC Managed Care – PPO | Admitting: Physical Therapy

## 2012-04-16 ENCOUNTER — Ambulatory Visit: Payer: BC Managed Care – PPO | Admitting: Occupational Therapy

## 2012-04-16 ENCOUNTER — Ambulatory Visit
Payer: BC Managed Care – PPO | Attending: Physical Medicine & Rehabilitation | Admitting: Rehabilitative and Restorative Service Providers"

## 2012-04-16 DIAGNOSIS — I69998 Other sequelae following unspecified cerebrovascular disease: Secondary | ICD-10-CM | POA: Insufficient documentation

## 2012-04-16 DIAGNOSIS — R269 Unspecified abnormalities of gait and mobility: Secondary | ICD-10-CM | POA: Insufficient documentation

## 2012-04-16 DIAGNOSIS — I69919 Unspecified symptoms and signs involving cognitive functions following unspecified cerebrovascular disease: Secondary | ICD-10-CM | POA: Insufficient documentation

## 2012-04-16 DIAGNOSIS — M6281 Muscle weakness (generalized): Secondary | ICD-10-CM | POA: Insufficient documentation

## 2012-04-16 DIAGNOSIS — Z5189 Encounter for other specified aftercare: Secondary | ICD-10-CM | POA: Insufficient documentation

## 2012-04-16 DIAGNOSIS — R279 Unspecified lack of coordination: Secondary | ICD-10-CM | POA: Insufficient documentation

## 2012-04-16 DIAGNOSIS — R41842 Visuospatial deficit: Secondary | ICD-10-CM | POA: Insufficient documentation

## 2012-04-18 ENCOUNTER — Ambulatory Visit: Payer: BC Managed Care – PPO | Admitting: Physical Therapy

## 2012-04-18 ENCOUNTER — Ambulatory Visit: Payer: BC Managed Care – PPO | Admitting: Occupational Therapy

## 2012-04-19 ENCOUNTER — Ambulatory Visit: Payer: BC Managed Care – PPO | Admitting: Physical Therapy

## 2012-04-22 ENCOUNTER — Ambulatory Visit (HOSPITAL_BASED_OUTPATIENT_CLINIC_OR_DEPARTMENT_OTHER): Payer: BC Managed Care – PPO | Admitting: Physical Medicine & Rehabilitation

## 2012-04-22 ENCOUNTER — Encounter: Payer: BC Managed Care – PPO | Attending: Physical Medicine & Rehabilitation

## 2012-04-22 ENCOUNTER — Encounter: Payer: Self-pay | Admitting: Physical Medicine & Rehabilitation

## 2012-04-22 VITALS — BP 120/79 | HR 74 | Resp 14 | Ht 69.0 in | Wt 169.0 lb

## 2012-04-22 DIAGNOSIS — I619 Nontraumatic intracerebral hemorrhage, unspecified: Secondary | ICD-10-CM

## 2012-04-22 DIAGNOSIS — G8190 Hemiplegia, unspecified affecting unspecified side: Secondary | ICD-10-CM

## 2012-04-22 DIAGNOSIS — R209 Unspecified disturbances of skin sensation: Secondary | ICD-10-CM

## 2012-04-22 DIAGNOSIS — I610 Nontraumatic intracerebral hemorrhage in hemisphere, subcortical: Secondary | ICD-10-CM

## 2012-04-22 DIAGNOSIS — I69959 Hemiplegia and hemiparesis following unspecified cerebrovascular disease affecting unspecified side: Secondary | ICD-10-CM | POA: Insufficient documentation

## 2012-04-22 DIAGNOSIS — I69993 Ataxia following unspecified cerebrovascular disease: Secondary | ICD-10-CM

## 2012-04-22 DIAGNOSIS — I69998 Other sequelae following unspecified cerebrovascular disease: Secondary | ICD-10-CM

## 2012-04-22 NOTE — Progress Notes (Signed)
Subjective:    Patient ID: Anthony Hardin, male    DOB: 1950/10/21, 62 y.o.   MRN: [id number]  HPI 62-year-old right-handed male with history of hypertension,  who is employed as a Chief Executive Officer at [employer]. Admitted  October 31, 2011 with diffuse weakness and unable to move, fell out of  his bed onto the floor. MRI of the brain showed a 4 x 1 cm x 2.5 cm  acute hematoma, right thalamus with mass effect, displacement,  distortion of the 3rd ventricle but no suspicion of obstructive  hydrocephalus. MRA of the head with no stenosis or occlusion. Close  monitoring of blood pressure with intravenous Cardene that was slowly  weaned and transitioned to by mouth for his medications.   Pain Inventory Average Pain 0 Pain Right Now 0 My pain is no pain  In the last 24 hours, has pain interfered with the following? General activity 0 Relation with others 0 Enjoyment of life 0 What TIME of day is your pain at its worst? no pain Sleep (in general) Fair  Pain is worse with: no pain Pain improves with: no pain Relief from Meds: nopain med  Mobility use a cane  Function employed # of hrs/week 40  Neuro/Psych No problems in this area  Prior Studies Any changes since last visit?  no  Physicians involved in your care Any changes since last visit?  no   Family History  Problem Relation Age of Onset  . Hypertension Mother   . Hypertension Father   . Stroke Paternal Aunt   . Anuerysm Paternal Grandfather    History   Social History  . Marital Status: Single    Spouse Name: N/A    Number of Children: N/A  . Years of Education: N/A   Social History Main Topics  . Smoking status: Never Smoker   . Smokeless tobacco: Never Used  . Alcohol Use: Yes  . Drug Use: No  . Sexually Active:    Other Topics Concern  . None   Social History Narrative  . None   History reviewed. No pertinent past surgical history. Past Medical History  Diagnosis Date  .  Hypertension   . Stroke    BP 120/79  Pulse 74  Resp 14  Ht 5\' 9"  (1.753 m)  Wt 169 lb (76.658 kg)  BMI 24.95 kg/m2  SpO2 98%     Review of Systems  All other systems reviewed and are negative.       Objective:   Physical Exam  eurological: He is alert and oriented to person, place, and time. He displays no atrophy. A sensory deficit is present. He exhibits abnormal muscle tone. Coordination and gait abnormal.  Ambulation: Mild knee hyperextension with good ankle control using anterior AFO  Moderate left knee hyperextension with poor ankle control mild to moderate inversion at the ankle without AFO Reduced sensation to light touch proprioception in the left 2nd to 5th digits absent in thumb and left lower extremity. Exception is left great toe which has partial sensation to pinprick and light touch but absent proprioception except with downward large excursion Motor strength is 4 minus at the left deltoid, biceps, triceps, grip, finger extensors 4 minus at the left hip flexor knee extensor 3 minus at the left ankle dorsiflexor and plantar flexor Sensory ataxia left upper extremity  Psychiatric: He has a normal mood and affect.  Tactile extinction No visual extinction     Assessment & Plan:  1. Right  thalamic hemorrhage with left hemiparesis, dense left hemisensory deficits, sensory ataxia, as well as a resolving left neglect. He is able to stay home on his own now He has been instructed not to drive and has not driven since his stroke  He plans to return to work on a limited basis working from home starting in January.No longer getting SLP.  Continue outpatient PT 2 x wk OT  Working twice a week  Graduated return to driving instructions were provided. It is recommended that the patient first drives with another licensed driver in an empty parking lot. If the patient does well with this, and they can drive on a quiet street with the licensed driver. If the patient does well with  this they can drive on a busy street with a licensed driver. If the patient does well with this, the next time out they can go by himself. For the first month after resuming driving, I recommend no nighttime or Interstate driving.

## 2012-04-22 NOTE — Patient Instructions (Signed)

## 2012-04-23 ENCOUNTER — Ambulatory Visit: Payer: BC Managed Care – PPO | Admitting: Occupational Therapy

## 2012-04-23 ENCOUNTER — Ambulatory Visit: Payer: BC Managed Care – PPO | Admitting: Physical Therapy

## 2012-04-25 ENCOUNTER — Ambulatory Visit: Payer: BC Managed Care – PPO | Admitting: Occupational Therapy

## 2012-04-25 ENCOUNTER — Ambulatory Visit: Payer: BC Managed Care – PPO | Admitting: Physical Therapy

## 2012-04-30 ENCOUNTER — Ambulatory Visit: Payer: BC Managed Care – PPO | Admitting: Physical Therapy

## 2012-04-30 ENCOUNTER — Ambulatory Visit: Payer: BC Managed Care – PPO | Admitting: Occupational Therapy

## 2012-05-02 ENCOUNTER — Ambulatory Visit: Payer: BC Managed Care – PPO | Admitting: Occupational Therapy

## 2012-05-02 ENCOUNTER — Ambulatory Visit: Payer: BC Managed Care – PPO | Admitting: Physical Therapy

## 2012-05-06 ENCOUNTER — Ambulatory Visit: Payer: BC Managed Care – PPO | Admitting: Physical Therapy

## 2012-05-06 ENCOUNTER — Ambulatory Visit: Payer: BC Managed Care – PPO | Admitting: Occupational Therapy

## 2012-05-08 ENCOUNTER — Ambulatory Visit: Payer: BC Managed Care – PPO | Admitting: Occupational Therapy

## 2012-05-08 ENCOUNTER — Ambulatory Visit: Payer: BC Managed Care – PPO | Admitting: Physical Therapy

## 2012-05-14 ENCOUNTER — Ambulatory Visit: Payer: BC Managed Care – PPO | Admitting: Physical Therapy

## 2012-05-14 ENCOUNTER — Ambulatory Visit: Payer: BC Managed Care – PPO | Admitting: Occupational Therapy

## 2012-05-16 ENCOUNTER — Ambulatory Visit: Payer: BC Managed Care – PPO | Admitting: Physical Therapy

## 2012-05-16 ENCOUNTER — Ambulatory Visit: Payer: BC Managed Care – PPO | Attending: Physical Medicine & Rehabilitation | Admitting: Occupational Therapy

## 2012-05-16 DIAGNOSIS — R279 Unspecified lack of coordination: Secondary | ICD-10-CM | POA: Insufficient documentation

## 2012-05-16 DIAGNOSIS — I69919 Unspecified symptoms and signs involving cognitive functions following unspecified cerebrovascular disease: Secondary | ICD-10-CM | POA: Insufficient documentation

## 2012-05-16 DIAGNOSIS — I69998 Other sequelae following unspecified cerebrovascular disease: Secondary | ICD-10-CM | POA: Insufficient documentation

## 2012-05-16 DIAGNOSIS — M6281 Muscle weakness (generalized): Secondary | ICD-10-CM | POA: Insufficient documentation

## 2012-05-16 DIAGNOSIS — Z5189 Encounter for other specified aftercare: Secondary | ICD-10-CM | POA: Insufficient documentation

## 2012-05-16 DIAGNOSIS — R41842 Visuospatial deficit: Secondary | ICD-10-CM | POA: Insufficient documentation

## 2012-05-16 DIAGNOSIS — R269 Unspecified abnormalities of gait and mobility: Secondary | ICD-10-CM | POA: Insufficient documentation

## 2012-05-20 ENCOUNTER — Encounter: Payer: BC Managed Care – PPO | Attending: Physical Medicine & Rehabilitation

## 2012-05-20 ENCOUNTER — Encounter: Payer: Self-pay | Admitting: Physical Medicine & Rehabilitation

## 2012-05-20 ENCOUNTER — Ambulatory Visit (HOSPITAL_BASED_OUTPATIENT_CLINIC_OR_DEPARTMENT_OTHER): Payer: BC Managed Care – PPO | Admitting: Physical Medicine & Rehabilitation

## 2012-05-20 VITALS — BP 135/84 | HR 72 | Resp 15 | Ht 69.0 in | Wt 172.0 lb

## 2012-05-20 DIAGNOSIS — I69998 Other sequelae following unspecified cerebrovascular disease: Secondary | ICD-10-CM

## 2012-05-20 DIAGNOSIS — I69993 Ataxia following unspecified cerebrovascular disease: Secondary | ICD-10-CM | POA: Insufficient documentation

## 2012-05-20 DIAGNOSIS — I69959 Hemiplegia and hemiparesis following unspecified cerebrovascular disease affecting unspecified side: Secondary | ICD-10-CM | POA: Insufficient documentation

## 2012-05-20 NOTE — Patient Instructions (Signed)
-  Cont PT/OT

## 2012-05-20 NOTE — Progress Notes (Signed)
Subjective:    Patient ID: Anthony Hardin, male    DOB: August 31, 1950, 62 y.o.   MRN: [id number]  HPI 62 year old right-handed male with history of hypertension,  who is employed as a Chief Executive Officer at [employer]. Admitted  October 31, 2011 with diffuse weakness and unable to move, fell out of  his bed onto the floor. MRI of the brain showed a 4 x 1 cm x 2.5 cm  acute hematoma, right thalamus with mass effect, displacement,  distortion of the 3rd ventricle but no suspicion of obstructive  hydrocephalus. MRA of the head with no stenosis or occlusion. Close  monitoring of blood pressure with intravenous Cardene that was slowly  weaned and transitioned to by mouth for his medications.  Less dependant on cane Has resumed driving without difficulty in the last month Pain Inventory Average Pain 0 Pain Right Now 0 My pain is no answer  In the last 24 hours, has pain interfered with the following? General activity 0 Relation with others 0 Enjoyment of life 0 What TIME of day is your pain at its worst? no answer Sleep (in general) Good  Pain is worse with: no pain Pain improves with: no pian Relief from Meds: no pain  Mobility use a cane how many minutes can you walk? 20 Do you have any goals in this area?  yes  Function Do you have any goals in this area?  yes  Neuro/Psych numbness  Prior Studies Any changes since last visit?  no  Physicians involved in your care Any changes since last visit?  no   Family History  Problem Relation Age of Onset  . Hypertension Mother   . Hypertension Father   . Stroke Paternal Aunt   . Anuerysm Paternal Grandfather    History   Social History  . Marital Status: Single    Spouse Name: N/A    Number of Children: N/A  . Years of Education: N/A   Social History Main Topics  . Smoking status: Never Smoker   . Smokeless tobacco: Never Used  . Alcohol Use: Yes  . Drug Use: No  . Sexually Active:    Other Topics  Concern  . None   Social History Narrative  . None   History reviewed. No pertinent past surgical history. Past Medical History  Diagnosis Date  . Hypertension   . Stroke    BP 135/84  Pulse 72  Resp 15  Ht 5\' 9"  (1.753 m)  Wt 172 lb (78.019 kg)  BMI 25.39 kg/m2  SpO2 98%      Review of Systems  Neurological: Positive for numbness.  All other systems reviewed and are negative.       Objective:   Physical Exam  eurological: He is alert and oriented to person, place, and time. He displays no atrophy. A sensory deficit is present. He exhibits abnormal muscle tone. Coordination and gait abnormal.  Ambulation: Mild knee hyperextension with good ankle control using anterior AFO  Moderate left knee hyperextension with poor ankle control mild to moderate inversion at the ankle without AFO Reduced sensation to light touch proprioception in the left 2nd to 5th digits , thumb but now able to localize and left lower extremity. Exception is left great toe which has partial sensation to pinprick and light touch but absent proprioception except with downward large excursion Motor strength is 4 minus at the left deltoid, biceps, triceps, grip, finger extensors 4 minus at the left hip flexor knee extensor  3 minus at the left ankle dorsiflexor and plantar flexor Sensory ataxia left upper extremity  Psychiatric: He has a normal mood and affect.  Tactile extinction  No visual extinction       Assessment & Plan:  1. Right thalamic hemorrhage with left hemiparesis, dense left hemisensory deficits, sensory ataxia,. He is able to stay home on his own now He is driving without difficulty after I released him last visit. Continue outpatient PT 2 x wk OT  Working 6 days a week but goes in 2-3 days/week

## 2012-05-21 ENCOUNTER — Ambulatory Visit: Payer: BC Managed Care – PPO | Admitting: Occupational Therapy

## 2012-05-21 ENCOUNTER — Ambulatory Visit: Payer: BC Managed Care – PPO | Admitting: Physical Therapy

## 2012-05-21 ENCOUNTER — Ambulatory Visit: Payer: BC Managed Care – PPO | Admitting: Physical Medicine & Rehabilitation

## 2012-05-23 ENCOUNTER — Ambulatory Visit: Payer: BC Managed Care – PPO | Admitting: Occupational Therapy

## 2012-05-23 ENCOUNTER — Ambulatory Visit: Payer: BC Managed Care – PPO | Admitting: Physical Therapy

## 2012-05-24 ENCOUNTER — Ambulatory Visit: Payer: BC Managed Care – PPO | Admitting: Physical Therapy

## 2012-05-28 ENCOUNTER — Ambulatory Visit: Payer: BC Managed Care – PPO | Admitting: Physical Therapy

## 2012-05-30 ENCOUNTER — Ambulatory Visit: Payer: BC Managed Care – PPO | Admitting: Physical Therapy

## 2012-06-04 ENCOUNTER — Ambulatory Visit: Payer: BC Managed Care – PPO | Admitting: Physical Therapy

## 2012-06-04 ENCOUNTER — Ambulatory Visit: Payer: BC Managed Care – PPO | Admitting: Occupational Therapy

## 2012-06-07 ENCOUNTER — Ambulatory Visit: Payer: BC Managed Care – PPO | Admitting: Occupational Therapy

## 2012-06-07 ENCOUNTER — Ambulatory Visit: Payer: BC Managed Care – PPO | Admitting: Physical Therapy

## 2012-06-11 ENCOUNTER — Ambulatory Visit: Payer: BC Managed Care – PPO | Admitting: Occupational Therapy

## 2012-06-11 ENCOUNTER — Ambulatory Visit: Payer: BC Managed Care – PPO | Attending: Physical Medicine & Rehabilitation | Admitting: Physical Therapy

## 2012-06-11 DIAGNOSIS — R41842 Visuospatial deficit: Secondary | ICD-10-CM | POA: Insufficient documentation

## 2012-06-11 DIAGNOSIS — M6281 Muscle weakness (generalized): Secondary | ICD-10-CM | POA: Insufficient documentation

## 2012-06-11 DIAGNOSIS — Z5189 Encounter for other specified aftercare: Secondary | ICD-10-CM | POA: Insufficient documentation

## 2012-06-11 DIAGNOSIS — I69919 Unspecified symptoms and signs involving cognitive functions following unspecified cerebrovascular disease: Secondary | ICD-10-CM | POA: Insufficient documentation

## 2012-06-11 DIAGNOSIS — I69998 Other sequelae following unspecified cerebrovascular disease: Secondary | ICD-10-CM | POA: Insufficient documentation

## 2012-06-11 DIAGNOSIS — R269 Unspecified abnormalities of gait and mobility: Secondary | ICD-10-CM | POA: Insufficient documentation

## 2012-06-11 DIAGNOSIS — R279 Unspecified lack of coordination: Secondary | ICD-10-CM | POA: Insufficient documentation

## 2012-06-13 ENCOUNTER — Ambulatory Visit: Payer: BC Managed Care – PPO | Admitting: Occupational Therapy

## 2012-06-13 ENCOUNTER — Ambulatory Visit: Payer: BC Managed Care – PPO | Admitting: Physical Therapy

## 2012-06-18 ENCOUNTER — Ambulatory Visit: Payer: BC Managed Care – PPO | Admitting: Physical Therapy

## 2012-06-18 ENCOUNTER — Ambulatory Visit: Payer: BC Managed Care – PPO | Admitting: Occupational Therapy

## 2012-06-20 ENCOUNTER — Ambulatory Visit: Payer: BC Managed Care – PPO | Admitting: Physical Therapy

## 2012-06-20 ENCOUNTER — Ambulatory Visit: Payer: BC Managed Care – PPO | Admitting: Occupational Therapy

## 2012-06-25 ENCOUNTER — Encounter: Payer: BC Managed Care – PPO | Attending: Physical Medicine & Rehabilitation

## 2012-06-25 ENCOUNTER — Ambulatory Visit (HOSPITAL_BASED_OUTPATIENT_CLINIC_OR_DEPARTMENT_OTHER): Payer: BC Managed Care – PPO | Admitting: Physical Medicine & Rehabilitation

## 2012-06-25 ENCOUNTER — Encounter: Payer: Self-pay | Admitting: Physical Medicine & Rehabilitation

## 2012-06-25 ENCOUNTER — Ambulatory Visit: Payer: BC Managed Care – PPO | Admitting: Physical Therapy

## 2012-06-25 ENCOUNTER — Ambulatory Visit: Payer: BC Managed Care – PPO | Admitting: Occupational Therapy

## 2012-06-25 VITALS — BP 123/71 | HR 71 | Resp 14 | Ht 69.0 in | Wt 175.0 lb

## 2012-06-25 DIAGNOSIS — I10 Essential (primary) hypertension: Secondary | ICD-10-CM | POA: Insufficient documentation

## 2012-06-25 DIAGNOSIS — I619 Nontraumatic intracerebral hemorrhage, unspecified: Secondary | ICD-10-CM

## 2012-06-25 DIAGNOSIS — I69993 Ataxia following unspecified cerebrovascular disease: Secondary | ICD-10-CM

## 2012-06-25 DIAGNOSIS — R209 Unspecified disturbances of skin sensation: Secondary | ICD-10-CM

## 2012-06-25 DIAGNOSIS — G8190 Hemiplegia, unspecified affecting unspecified side: Secondary | ICD-10-CM

## 2012-06-25 DIAGNOSIS — I69959 Hemiplegia and hemiparesis following unspecified cerebrovascular disease affecting unspecified side: Secondary | ICD-10-CM | POA: Insufficient documentation

## 2012-06-25 DIAGNOSIS — I610 Nontraumatic intracerebral hemorrhage in hemisphere, subcortical: Secondary | ICD-10-CM

## 2012-06-25 DIAGNOSIS — I69998 Other sequelae following unspecified cerebrovascular disease: Secondary | ICD-10-CM

## 2012-06-25 NOTE — Progress Notes (Signed)
Subjective:    Patient ID: Anthony Hardin, male    DOB: 23-May-1950, 62 y.o.   MRN: [id number]  HPI 62 year old right-handed male with history of hypertension,  who is employed as a Chief Executive Officer at [employer]. Admitted  October 31, 2011 with diffuse weakness and unable to move, fell out of  his bed onto the floor. MRI of the brain showed a 4 x 1 cm x 2.5 cm  acute hematoma, right thalamus with mass effect, displacement,  distortion of the 3rd ventricle but no suspicion of obstructive  hydrocephalus. MRA of the head with no stenosis or occlusion. Close  monitoring of blood pressure with intravenous Cardene that was slowly  weaned and transitioned to by mouth for his medications. Playing piano on occ Personal trainer twice a week PT/OT twice a week Pain Inventory Average Pain 0 Pain Right Now 0 My pain is no pain  In the last 24 hours, has pain interfered with the following? General activity 0 Relation with others 0 Enjoyment of life 0 What TIME of day is your pain at its worst? no pain Sleep (in general) no pain  Pain is worse with: no pain Pain improves with: no pain Relief from Meds: no pain  Mobility how many minutes can you walk? 30  Function employed # of hrs/week .  Neuro/Psych No problems in this area  Prior Studies Any changes since last visit?  no  Physicians involved in your care Any changes since last visit?  no   Family History  Problem Relation Age of Onset  . Hypertension Mother   . Hypertension Father   . Stroke Paternal Aunt   . Anuerysm Paternal Grandfather    History   Social History  . Marital Status: Single    Spouse Name: N/A    Number of Children: N/A  . Years of Education: N/A   Social History Main Topics  . Smoking status: Never Smoker   . Smokeless tobacco: Never Used  . Alcohol Use: Yes  . Drug Use: No  . Sexually Active:    Other Topics Concern  . None   Social History Narrative  . None   History  reviewed. No pertinent past surgical history. Past Medical History  Diagnosis Date  . Hypertension   . Stroke    BP 123/71  Pulse 71  Resp 14  Ht 5\' 9"  (1.753 m)  Wt 175 lb (79.379 kg)  BMI 25.83 kg/m2  SpO2 96%    Review of Systems  Musculoskeletal: Positive for gait problem.  All other systems reviewed and are negative.       Objective:   Physical Exam eurological: He is alert and oriented to person, place, and time. He displays no atrophy. A sensory deficit is present. He exhibits abnormal muscle tone. Coordination and gait abnormal.  Ambulation:  Moderate left knee hyperextension with poor ankle control minimal inversion at the ankle without AFO Reduced sensation to light touch proprioception in the left 2nd to 5th digits , thumb but now able to localize and left lower extremity. Exception is left great toe which has partial sensation to pinprick and light touch but absent proprioception except with downward large excursion Motor strength is 4 minus at the left deltoid, biceps, triceps, grip, finger extensors 4 minus at the left hip flexor knee extensor 3 minus at the left ankle dorsiflexor and 3+plantar flexor Sensory ataxia left upper extremity  Psychiatric: He has a normal mood and affect.  Tactile extinction  No visual extinction        Assessment & Plan:  1. Right thalamic hemorrhage with left hemiparesis, dense left hemisensory deficits, sensory ataxia,. He is able to stay home on his own now He is driving without difficulty after I released him last visit. Continue outpatient PT 2 x wk OT  Working 6 days a week but goes in 2-3 days/week. Discussed with patient that no objective changes in sensation since last month. He does have some subjective changes in sensation however. RTC 1 month Over half of the 25 min visit was spent counseling and coordinating care.

## 2012-06-25 NOTE — Patient Instructions (Signed)
Constraint induced movement therapy

## 2012-06-27 ENCOUNTER — Ambulatory Visit: Payer: BC Managed Care – PPO | Admitting: Occupational Therapy

## 2012-07-02 ENCOUNTER — Ambulatory Visit: Payer: BC Managed Care – PPO | Admitting: Occupational Therapy

## 2012-07-02 ENCOUNTER — Ambulatory Visit: Payer: BC Managed Care – PPO | Admitting: Physical Therapy

## 2012-07-04 ENCOUNTER — Ambulatory Visit: Payer: BC Managed Care – PPO | Admitting: Occupational Therapy

## 2012-07-04 ENCOUNTER — Ambulatory Visit: Payer: BC Managed Care – PPO | Admitting: Rehabilitative and Restorative Service Providers"

## 2012-07-09 ENCOUNTER — Ambulatory Visit: Payer: BC Managed Care – PPO | Admitting: Physical Therapy

## 2012-07-09 ENCOUNTER — Ambulatory Visit: Payer: BC Managed Care – PPO | Admitting: Occupational Therapy

## 2012-07-11 ENCOUNTER — Ambulatory Visit: Payer: BC Managed Care – PPO | Attending: Physical Medicine & Rehabilitation

## 2012-07-11 ENCOUNTER — Ambulatory Visit: Payer: BC Managed Care – PPO | Admitting: Occupational Therapy

## 2012-07-11 DIAGNOSIS — I69919 Unspecified symptoms and signs involving cognitive functions following unspecified cerebrovascular disease: Secondary | ICD-10-CM | POA: Insufficient documentation

## 2012-07-11 DIAGNOSIS — R279 Unspecified lack of coordination: Secondary | ICD-10-CM | POA: Insufficient documentation

## 2012-07-11 DIAGNOSIS — I69998 Other sequelae following unspecified cerebrovascular disease: Secondary | ICD-10-CM | POA: Insufficient documentation

## 2012-07-11 DIAGNOSIS — M6281 Muscle weakness (generalized): Secondary | ICD-10-CM | POA: Insufficient documentation

## 2012-07-11 DIAGNOSIS — Z5189 Encounter for other specified aftercare: Secondary | ICD-10-CM | POA: Insufficient documentation

## 2012-07-11 DIAGNOSIS — R41842 Visuospatial deficit: Secondary | ICD-10-CM | POA: Insufficient documentation

## 2012-07-11 DIAGNOSIS — R269 Unspecified abnormalities of gait and mobility: Secondary | ICD-10-CM | POA: Insufficient documentation

## 2012-07-17 ENCOUNTER — Ambulatory Visit: Payer: BC Managed Care – PPO | Admitting: Occupational Therapy

## 2012-07-17 ENCOUNTER — Ambulatory Visit: Payer: BC Managed Care – PPO

## 2012-07-19 ENCOUNTER — Ambulatory Visit: Payer: BC Managed Care – PPO | Admitting: Physical Therapy

## 2012-07-19 ENCOUNTER — Ambulatory Visit: Payer: BC Managed Care – PPO | Admitting: Occupational Therapy

## 2012-07-22 ENCOUNTER — Encounter: Payer: BC Managed Care – PPO | Attending: Physical Medicine & Rehabilitation

## 2012-07-22 ENCOUNTER — Encounter: Payer: Self-pay | Admitting: Physical Medicine & Rehabilitation

## 2012-07-22 ENCOUNTER — Ambulatory Visit (HOSPITAL_BASED_OUTPATIENT_CLINIC_OR_DEPARTMENT_OTHER): Payer: BC Managed Care – PPO | Admitting: Physical Medicine & Rehabilitation

## 2012-07-22 VITALS — BP 130/79 | HR 69 | Resp 14 | Ht 69.0 in | Wt 173.0 lb

## 2012-07-22 DIAGNOSIS — I69959 Hemiplegia and hemiparesis following unspecified cerebrovascular disease affecting unspecified side: Secondary | ICD-10-CM | POA: Insufficient documentation

## 2012-07-22 DIAGNOSIS — I69993 Ataxia following unspecified cerebrovascular disease: Secondary | ICD-10-CM | POA: Insufficient documentation

## 2012-07-22 DIAGNOSIS — G8190 Hemiplegia, unspecified affecting unspecified side: Secondary | ICD-10-CM

## 2012-07-22 DIAGNOSIS — I619 Nontraumatic intracerebral hemorrhage, unspecified: Secondary | ICD-10-CM

## 2012-07-22 NOTE — Progress Notes (Signed)
Subjective:    Patient ID: Anthony Hardin, male    DOB: 04-Aug-1950, 62 y.o.   MRN: [id number]  HPI 62 year old right-handed male with history of hypertension,  who is employed as a Chief Executive Officer at [employer]. Admitted  October 31, 2011 with diffuse weakness and unable to move, fell out of  his bed onto the floor. MRI of the brain showed a 4 x 1 cm x 2.5 cm  acute hematoma, right thalamus with mass effect, displacement,  distortion of the 3rd ventricle but no suspicion of obstructive  hydrocephalus. MRA of the head with no stenosis or occlusion. Close  monitoring of blood pressure with intravenous Cardene that was slowly  weaned and transitioned to by mouth for his medications.   Still using cane Strength improving  Personal trainer twice a week  PT/OT twice a week  Pain Inventory Average Pain 0 Pain Right Now 0 My pain is no pain  In the last 24 hours, has pain interfered with the following? General activity 0 Relation with others 0 Enjoyment of life 0 What TIME of day is your pain at its worst? no pain Sleep (in general) no pain  Pain is worse with: no pain Pain improves with: no pain Relief from Meds: no pain  Mobility use a cane ability to climb steps?  yes do you drive?  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  no  Physicians involved in your care Any changes since last visit?  no   Family History  Problem Relation Age of Onset  . Hypertension Mother   . Hypertension Father   . Stroke Paternal Aunt   . Anuerysm Paternal Grandfather    History   Social History  . Marital Status: Single    Spouse Name: N/A    Number of Children: N/A  . Years of Education: N/A   Social History Main Topics  . Smoking status: Never Smoker   . Smokeless tobacco: Never Used  . Alcohol Use: Yes  . Drug Use: No  . Sexually Active:    Other  Topics Concern  . None   Social History Narrative  . None   No past surgical history on file. Past Medical History  Diagnosis Date  . Hypertension   . Stroke    BP 130/79  Pulse 69  Resp 14  Ht 5\' 9"  (1.753 m)  Wt 173 lb (78.472 kg)  BMI 25.54 kg/m2  SpO2 98%    Review of Systems  Musculoskeletal: Positive for gait problem.  All other systems reviewed and are negative.       Objective:   Physical Exam  eurological: He is alert and oriented to person, place, and time. He displays no atrophy. A sensory deficit is present. He exhibits abnormal muscle tone. Coordination and gait abnormal.  Ambulation:  Moderate left knee hyperextension with poor ankle control minimal inversion at the ankle without AFO Reduced sensation to light touch proprioception in the left 2nd to 5th digits ,Not  thumb but  left lower extremity. Exception is left great toe which has partial sensation to pinprick and light touch but absent proprioception except with downward large excursion Motor strength is 4 minus at the left deltoid, biceps, triceps, grip, finger extensors 4 minus at the left hip flexor knee extensor 3 minus at the left ankle dorsiflexor and 3+plantar flexor Sensory ataxia left upper extremity  Psychiatric: He has a normal mood and affect.  Tactile extinction in hands       Assessment & Plan:  1. Right thalamic hemorrhage with left hemiparesis, dense left hemisensory deficits, sensory ataxia,. He is able to stay home on his own now He is driving without difficulty after I released him last visit. Continue outpatient PT 2 x wk OT  Working 6 days a week but goes in 2-3 days/week.  Discussed with patient that no objective changes in sensation since last month. He does have some subjective changes in sensation however.  RTC 1 month  He will be done with formal PT OT at that time and doing a home exercise program.

## 2012-07-24 ENCOUNTER — Ambulatory Visit: Payer: BC Managed Care – PPO

## 2012-07-24 ENCOUNTER — Ambulatory Visit: Payer: BC Managed Care – PPO | Admitting: Occupational Therapy

## 2012-07-26 ENCOUNTER — Ambulatory Visit: Payer: BC Managed Care – PPO | Admitting: Physical Therapy

## 2012-07-30 ENCOUNTER — Ambulatory Visit: Payer: BC Managed Care – PPO | Admitting: Occupational Therapy

## 2012-07-30 ENCOUNTER — Ambulatory Visit: Payer: BC Managed Care – PPO | Admitting: Physical Therapy

## 2012-08-01 ENCOUNTER — Ambulatory Visit: Payer: BC Managed Care – PPO | Admitting: Physical Therapy

## 2012-08-01 ENCOUNTER — Ambulatory Visit: Payer: BC Managed Care – PPO | Admitting: Occupational Therapy

## 2012-08-20 ENCOUNTER — Ambulatory Visit: Payer: BC Managed Care – PPO | Attending: Physical Medicine & Rehabilitation | Admitting: Physical Therapy

## 2012-08-20 ENCOUNTER — Ambulatory Visit: Payer: BC Managed Care – PPO | Admitting: Physical Medicine & Rehabilitation

## 2012-08-20 DIAGNOSIS — Z5189 Encounter for other specified aftercare: Secondary | ICD-10-CM | POA: Insufficient documentation

## 2012-08-20 DIAGNOSIS — R269 Unspecified abnormalities of gait and mobility: Secondary | ICD-10-CM | POA: Insufficient documentation

## 2012-08-20 DIAGNOSIS — R41842 Visuospatial deficit: Secondary | ICD-10-CM | POA: Insufficient documentation

## 2012-08-20 DIAGNOSIS — I69998 Other sequelae following unspecified cerebrovascular disease: Secondary | ICD-10-CM | POA: Insufficient documentation

## 2012-08-20 DIAGNOSIS — I69919 Unspecified symptoms and signs involving cognitive functions following unspecified cerebrovascular disease: Secondary | ICD-10-CM | POA: Insufficient documentation

## 2012-08-20 DIAGNOSIS — M6281 Muscle weakness (generalized): Secondary | ICD-10-CM | POA: Insufficient documentation

## 2012-08-20 DIAGNOSIS — R279 Unspecified lack of coordination: Secondary | ICD-10-CM | POA: Insufficient documentation

## 2012-08-22 ENCOUNTER — Ambulatory Visit: Payer: BC Managed Care – PPO | Admitting: Physical Therapy

## 2012-08-27 ENCOUNTER — Ambulatory Visit: Payer: BC Managed Care – PPO | Admitting: Physical Therapy

## 2012-08-29 ENCOUNTER — Encounter: Payer: Self-pay | Admitting: Physical Medicine and Rehabilitation

## 2012-08-29 ENCOUNTER — Encounter
Payer: BC Managed Care – PPO | Attending: Physical Medicine and Rehabilitation | Admitting: Physical Medicine and Rehabilitation

## 2012-08-29 ENCOUNTER — Ambulatory Visit: Payer: BC Managed Care – PPO | Admitting: Physical Medicine & Rehabilitation

## 2012-08-29 ENCOUNTER — Ambulatory Visit: Payer: BC Managed Care – PPO | Admitting: Physical Therapy

## 2012-08-29 VITALS — BP 128/87 | HR 71 | Resp 14 | Ht 69.0 in | Wt 173.0 lb

## 2012-08-29 DIAGNOSIS — I69359 Hemiplegia and hemiparesis following cerebral infarction affecting unspecified side: Secondary | ICD-10-CM

## 2012-08-29 DIAGNOSIS — I10 Essential (primary) hypertension: Secondary | ICD-10-CM | POA: Insufficient documentation

## 2012-08-29 DIAGNOSIS — I69959 Hemiplegia and hemiparesis following unspecified cerebrovascular disease affecting unspecified side: Secondary | ICD-10-CM | POA: Insufficient documentation

## 2012-08-29 DIAGNOSIS — I619 Nontraumatic intracerebral hemorrhage, unspecified: Secondary | ICD-10-CM

## 2012-08-29 NOTE — Progress Notes (Signed)
Subjective:    Patient ID: Anthony Hardin, male    DOB: 09-Sep-1950, 62 y.o.   MRN: [id number]  HPI 62 year old right-handed male with history of hypertension,  who is employed as a Chief Executive Officer at [employer]. Admitted  October 31, 2011 with diffuse weakness on the left side and unable to move, fell out of  his bed onto the floor. MRI of the brain showed a 4 x 1 cm x 2.5 cm  acute hematoma, right thalamus with mass effect, displacement,  distortion of the 3rd ventricle but no suspicion of obstructive  hydrocephalus. MRA of the head with no stenosis or occlusion.  Today the patient is doing considerably well, he has almost finished PT, he states that he is working out with a Systems analyst 2 times per week, and wants to start aquatic exercises when he has finished PT next week. He also exercises at home daily.   Pain Inventory Average Pain 0 Pain Right Now 0 My pain is n/a  In the last 24 hours, has pain interfered with the following? General activity 0 Relation with others 0 Enjoyment of life 0 What TIME of day is your pain at its worst? varies Sleep (in general) Fair  Pain is worse with: some activites Pain improves with: pacing activities Relief from Meds: n/a  Mobility walk without assistance how many minutes can you walk? 45 ability to climb steps?  yes do you drive?  yes Do you have any goals in this area?  yes  Function employed # of hrs/week 40 Do you have any goals in this area?  yes  Neuro/Psych No problems in this area trouble walking  Prior Studies Any changes since last visit?  no  Physicians involved in your care Any changes since last visit?  no   Family History  Problem Relation Age of Onset  . Hypertension Mother   . Hypertension Father   . Stroke Paternal Aunt   . Anuerysm Paternal Grandfather    History   Social History  . Marital Status: Single    Spouse Name: N/A    Number of Children: N/A  . Years of Education:  N/A   Social History Main Topics  . Smoking status: Never Smoker   . Smokeless tobacco: Never Used  . Alcohol Use: Yes  . Drug Use: No  . Sexually Active:    Other Topics Concern  . None   Social History Narrative  . None   History reviewed. No pertinent past surgical history. Past Medical History  Diagnosis Date  . Hypertension   . Stroke    BP 128/87  Pulse 71  Resp 14  Ht 5\' 9"  (1.753 m)  Wt 173 lb (78.472 kg)  BMI 25.54 kg/m2  SpO2 98%     Review of Systems  Musculoskeletal: Positive for gait problem.  All other systems reviewed and are negative.       Objective:   Physical Exam  Constitutional: He is oriented to person, place, and time. He appears well-developed and well-nourished.  HENT:  Head: Normocephalic.  Neck: Neck supple.  Neurological: He is alert and oriented to person, place, and time.  Skin: Skin is warm and dry.  Psychiatric: He has a normal mood and affect.    Symmetric normal motor tone is noted throughout,except increased muscle tone on the left. Normal muscle bulk. Muscle testing reveals 5/5 muscle strength of the upper extremity except left triceps 4/5, and 5/5 of the lower extremity, except left  LE 4+/5, left tibialis anterior and left peroneus 4-/5. Full range of motion in upper and lower extremities. Fine motor movements are slow on the left hand. Sensory is decreased to light touch,on the left.  DTR in the upper and lower extremity are present and symmetric 2+, except on the left 3+. No clonus is noted.  Patient arises from chair without difficulty. Narrow based gait with normal arm swing bilateral , able to stand on heels and toes . Pronator drift on the left. Rhomberg negative. Spastic gait left side       Assessment & Plan:  1. Right thalamic hemorrhage with left hemiparesis, dense left hemisensory deficits. t. Continue outpatient PT 2 x wk OT until next week, then continue with your exercise program, also incorporate the  exercises I showed you today. Try to get into aquatic exercising.  Working 6 days a week but goes in 2-3 days/week.   Spent 25 minutes with patient face to face RTC 1 month

## 2012-08-29 NOTE — Patient Instructions (Signed)
Continue with your exercise program, try to start aquatic exercising

## 2012-09-03 ENCOUNTER — Ambulatory Visit: Payer: BC Managed Care – PPO | Admitting: Physical Therapy

## 2012-09-04 ENCOUNTER — Telehealth: Payer: Self-pay

## 2012-09-04 NOTE — Telephone Encounter (Signed)
Samul Dada called to get verbal consent for patient to join study.  Unsure of facility and study she was calling from so called her and left message for her to call back with this information.

## 2012-09-05 ENCOUNTER — Ambulatory Visit: Payer: BC Managed Care – PPO | Admitting: Physical Therapy

## 2012-09-05 NOTE — Telephone Encounter (Signed)
I filled out the form al ready

## 2012-09-05 NOTE — Telephone Encounter (Signed)
[employer] Department of Kinesiology needs verbal to do group study.  They will also send a form to fill out.  Please advise.

## 2012-09-30 ENCOUNTER — Encounter: Payer: Self-pay | Admitting: Physical Medicine and Rehabilitation

## 2012-09-30 ENCOUNTER — Encounter
Payer: BC Managed Care – PPO | Attending: Physical Medicine and Rehabilitation | Admitting: Physical Medicine and Rehabilitation

## 2012-09-30 VITALS — BP 143/86 | HR 75 | Resp 14 | Ht 69.0 in | Wt 171.0 lb

## 2012-09-30 DIAGNOSIS — I619 Nontraumatic intracerebral hemorrhage, unspecified: Secondary | ICD-10-CM

## 2012-09-30 DIAGNOSIS — I10 Essential (primary) hypertension: Secondary | ICD-10-CM | POA: Insufficient documentation

## 2012-09-30 DIAGNOSIS — I69959 Hemiplegia and hemiparesis following unspecified cerebrovascular disease affecting unspecified side: Secondary | ICD-10-CM | POA: Insufficient documentation

## 2012-09-30 NOTE — Patient Instructions (Addendum)
Continue with your exercise program, try to start with the exercises in the water

## 2012-09-30 NOTE — Progress Notes (Signed)
Subjective:    Patient ID: Anthony Hardin, male    DOB: [birth year], 62 y.o.   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 MRI of the brain showed a 4 x 1 cm x 2.5 cm  acute hematoma, right thalamus with mass effect, displacement,  distortion of the 3rd ventricle but no suspicion of obstructive  hydrocephalus. MRA of the head with no stenosis or occlusion.  Today the patient is doing considerably well, he has finished PT, he states that he is working out with a Systems analyst 2 times per week, and wants to start aquatic exercises. He also exercises at home daily.   Pain Inventory Average Pain 0 Pain Right Now 0 My pain is n/a  In the last 24 hours, has pain interfered with the following? General activity 0 Relation with others 0 Enjoyment of life 0 What TIME of day is your pain at its worst? n/a Sleep (in general) Good  Pain is worse with: n/a Pain improves with: n/a Relief from Meds: n/a  Mobility walk without assistance how many minutes can you walk? 50 ability to climb steps?  yes do you drive?  yes Do you have any goals in this area?  yes  Function what is your job? 40 Do you have any goals in this area?  yes  Neuro/Psych No problems in this area  Prior Studies Any changes since last visit?  no  Physicians involved in your care Any changes since last visit?  no   Family History  Problem Relation Age of Onset  . Hypertension Mother   . Hypertension Father   . Stroke Paternal Aunt   . Anuerysm Paternal Grandfather    History   Social History  . Marital Status: Single    Spouse Name: N/A    Number of Children: N/A  . Years of Education: N/A   Social History Main Topics  . Smoking status: Never Smoker   . Smokeless tobacco:  Never Used  . Alcohol Use: Yes  . Drug Use: No  . Sexually Active:    Other Topics Concern  . None   Social History Narrative  . None   History reviewed. No pertinent past surgical history. Past Medical History  Diagnosis Date  . Hypertension   . Stroke    BP 143/86  Pulse 75  Resp 14  Ht 5\' 9"  (1.753 m)  Wt 171 lb (77.565 kg)  BMI 25.24 kg/m2  SpO2 97%     Review of Systems  All other systems reviewed and are negative.       Objective:   Physical Exam Constitutional: He is oriented to person, place, and time. He appears well-developed and well-nourished.  HENT:  Head: Normocephalic.  Neck: Neck supple.  Neurological: He is alert and oriented to person, place, and time.  Skin: Skin is warm and dry.  Psychiatric: He has a normal mood and affect.  Symmetric normal motor tone is noted throughout,except increased muscle tone on the left. Normal muscle bulk. Muscle testing reveals 5/5 muscle strength of the upper extremity except left triceps 4/5, and 5/5 of the lower extremity, except left LE 4+/5, left tibialis anterior and left peroneus 4-/5. Full range of motion in upper and lower extremities. Fine motor movements  are slow on the left hand.  Sensory is decreased to light touch,on the left.  DTR in the upper and lower extremity are present and symmetric 2+, except on the left 3+. No clonus is noted.  Patient arises from chair without difficulty. Narrow based gait with normal arm swing bilateral , able to stand on heels and toes . Pronator drift on the left. Rhomberg negative.  Spastic gait left side         Assessment & Plan:  1. Right thalamic hemorrhage with left hemiparesis, dense left hemisensory deficits.   Continue with your exercise program, also incorporate the exercises I showed you today. Try to get into aquatic exercising.  Working 6 days a week but goes in 2-3 days/week.   RTC 3 month

## 2012-10-02 ENCOUNTER — Encounter: Payer: Self-pay | Admitting: Neurology

## 2012-10-02 ENCOUNTER — Ambulatory Visit (INDEPENDENT_AMBULATORY_CARE_PROVIDER_SITE_OTHER): Payer: BC Managed Care – PPO | Admitting: Neurology

## 2012-10-02 VITALS — BP 118/71 | HR 65 | Temp 98.5°F | Ht 69.0 in | Wt 170.0 lb

## 2012-10-02 DIAGNOSIS — G8110 Spastic hemiplegia affecting unspecified side: Secondary | ICD-10-CM

## 2012-10-02 NOTE — Patient Instructions (Signed)
Continue strict control of hypertension with blood pressure goal below 130/90. Try water therapy for left leg spasticity but if not effective may call us to try Botox later. Return for followup in 6 months with Enid Skeens, NP

## 2012-10-02 NOTE — Progress Notes (Signed)
[facility] [address] [place]. [place] [address] [phone]       OFFICE FOLLOW-UP NOTE  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 He has finished outpatient physical and occupational therapy and states that is quite proud that he can walk independently without a cane. He still complains of stiffness and heaviness of his left leg as well as diminished fine motor skills and mild weakness in his left hand. He also has noticed some return of sensation on the left side with intermittent paresthesias. She states his blood pressure is quite good and in fact today it is 118/71 in office. He plans to start doing water therapy soon. ROS:   14 system review of systems is positive for positive for gait difficulties, hand weakness, tingling and numbness.  PMH:  Past Medical History  Diagnosis Date  . Hypertension   . Stroke     Social History:  History   Social History  . Marital Status: Single    Spouse Name: N/A    Number of Children: N/A  . Years of Education: N/A   Occupational History  . Not on file.   Social History Main Topics  . Smoking status: Never Smoker   . Smokeless tobacco: Never Used  . Alcohol Use: Yes  . Drug Use: No  . Sexually Active:    Other Topics Concern  . Not on file   Social History Narrative   Pt lives at home alone. He does not have any children and has a PHD education level. He quit smoking 1/2 pack of cigarettes in 1980 and quit marijuana use in 1976. He has 1-2 alcohol drinks a week and 2 cups of coffee daily.     Medications:   Current Outpatient Prescriptions on File Prior to Visit  Medication Sig Dispense Refill  .  amLODipine (NORVASC) 10 MG tablet Take 1 tablet (10 mg total) by mouth daily.  30 tablet  1  . cloNIDine (CATAPRES - DOSED IN MG/24 HR) 0.1 mg/24hr patch Place 1 patch (0.1 mg total) onto the skin once a week.  4 patch  1  . hydrochlorothiazide (HYDRODIURIL) 25 MG tablet Take 1 tablet (25 mg total) by mouth daily.  30 tablet  1  . lisinopril (PRINIVIL,ZESTRIL) 10 MG tablet Take 1 tablet (10 mg total) by mouth at bedtime.  30 tablet  1  . lisinopril (PRINIVIL,ZESTRIL) 20 MG tablet Take 1 tablet (20 mg total) by mouth daily.  30 tablet  1  . potassium chloride (K-DUR,KLOR-CON) 10 MEQ tablet Take 1 tablet (10 mEq total) by mouth daily.  30 tablet  1   No current facility-administered medications on file prior to visit.    Allergies:  No Known Allergies  Physical Exam General: well developed, well nourished, seated, in no evident distress Head: head normocephalic and atraumatic. Orohparynx benign Neck: supple with no carotid or supraclavicular bruits Cardiovascular: regular rate and rhythm, no murmurs Musculoskeletal: no deformity Skin:  no rash/petichiae Vascular:  Normal pulses all extremities Filed Vitals:   10/02/12 1409  BP: 118/71  Pulse: 65  Temp: 98.5 F (36.9 C)    Neurologic Exam Mental Status: Awake and fully alert. Oriented to place and time. Recent and  remote memory intact. Attention span, concentration and fund of knowledge appropriate. Mood and affect appropriate.  Cranial Nerves: Fundoscopic exam reveals sharp disc margins. Pupils equal, briskly reactive to light. Extraocular movements full without nystagmus. Visual fields full to confrontation. Hearing intact. Facial sensation intact. Face, tongue, palate moves normally and symmetrically.  Motor: Mild 4/5 strength on the left with increased tone and weakness of the left grip, intrinsic hand muscles and left ankle dorsiflexors. Mild left foot drop. Sensory.: Diminished left hemisensory touch and pinprick sensation.  Coordination: Impaired on the left side  Gait and Station: Arises from chair without difficulty. Stance is normal. Spastic hemiparetic gait with left foot drop and some stiffness of the left leg.  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    PLAN: Continue strict control of hypertension with blood pressure goal below 130/90. Try water therapy for left leg spasticity but if not effective may call us to try Botox later. Return for followup in 6 months with Enid Skeens, NP

## 2013-01-01 ENCOUNTER — Ambulatory Visit: Payer: BC Managed Care – PPO | Admitting: Physical Medicine and Rehabilitation

## 2013-01-03 ENCOUNTER — Encounter
Payer: BC Managed Care – PPO | Attending: Physical Medicine and Rehabilitation | Admitting: Physical Medicine and Rehabilitation

## 2013-01-03 ENCOUNTER — Encounter: Payer: Self-pay | Admitting: Physical Medicine and Rehabilitation

## 2013-01-03 VITALS — BP 120/78 | HR 67 | Resp 14 | Ht 69.0 in | Wt 175.0 lb

## 2013-01-03 DIAGNOSIS — I69959 Hemiplegia and hemiparesis following unspecified cerebrovascular disease affecting unspecified side: Secondary | ICD-10-CM | POA: Insufficient documentation

## 2013-01-03 DIAGNOSIS — G8110 Spastic hemiplegia affecting unspecified side: Secondary | ICD-10-CM

## 2013-01-03 DIAGNOSIS — I619 Nontraumatic intracerebral hemorrhage, unspecified: Secondary | ICD-10-CM

## 2013-01-03 NOTE — Patient Instructions (Signed)
Continue with your exercise program 

## 2013-01-03 NOTE — Progress Notes (Signed)
Subjective:    Patient ID: Anthony Hardin, male    DOB: 01-Nov-1950, 62 y.o.   MRN: [id number]  HPI 62 year old left-handed male with history of hypertension,  who is employed as a Chief Executive Officer at [employer]. Admitted  October 31, 2011 with diffuse weakness on the left side and unable to move, fell out of  his bed onto the floor. MRI of the brain showed a 4 x 1 cm x 2.5 cm  acute hematoma, right thalamus with mass effect, displacement,  distortion of the 3rd ventricle but no suspicion of obstructive  hydrocephalus. MRA of the head with no stenosis or occlusion.  Today the patient is doing considerably well he states that he is working out with a Systems analyst 2 times per week, and also does aquatic exercises regularly. He also exercises at home daily. He feels pretty well, overall.  Pain Inventory Average Pain 0 Pain Right Now 0 My pain is n/a  In the last 24 hours, has pain interfered with the following? General activity 0 Relation with others 0 Enjoyment of life 0 What TIME of day is your pain at its worst? n/a Sleep (in general) n/a  Pain is worse with: n/s Pain improves with: n/a Relief from Meds: 8  Mobility walk without assistance how many minutes can you walk? some ability to climb steps?  yes do you drive?  yes Do you have any goals in this area?  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  yes  Neuro/Psych No problems in this area  Prior Studies Any changes since last visit?  no  Physicians involved in your care Any changes since last visit?  no   Family History  Problem Relation Age of Onset  . Hypertension Mother   . Hypertension Father   . Stroke Paternal Aunt   . Anuerysm Paternal Grandfather    History   Social History  . Marital Status: Single    Spouse Name: N/A    Number of Children: N/A  . Years of Education: N/A   Social History Main Topics  . Smoking status: Never Smoker    . Smokeless tobacco: Never Used  . Alcohol Use: Yes  . Drug Use: No  . Sexual Activity:    Other Topics Concern  . None   Social History Narrative   Pt lives at home alone. He does not have any children and has a PHD education level. He quit smoking 1/2 pack of cigarettes in 1980 and quit marijuana use in 1976. He has 1-2 alcohol drinks a week and 2 cups of coffee daily.    No past surgical history on file. Past Medical History  Diagnosis Date  . Hypertension   . Stroke    BP 120/78  Pulse 67  Resp 14  Ht 5\' 9"  (1.753 m)  Wt 175 lb (79.379 kg)  BMI 25.83 kg/m2  SpO2 97%     Review of Systems  Musculoskeletal: Positive for gait problem.  All other systems reviewed and are negative.       Objective:   Physical Exam Constitutional: He is oriented to person, place, and time. He appears well-developed and well-nourished.  HENT:  Head: Normocephalic.  Neck: Neck supple.  Neurological: He is alert and oriented to person, place, and time.  Skin: Skin is warm and dry.  Psychiatric: He has a normal mood and affect.  Symmetric normal motor tone is noted throughout,except increased  muscle tone on the left. Normal muscle bulk. Muscle testing reveals 5/5 muscle strength of the upper extremity except left triceps 4/5, and 5/5 of the lower extremity, except left LE 4+/5, left tibialis anterior and left peroneus 4-/5. Full range of motion in upper and lower extremities. Fine motor movements are slow on the left hand.  Sensory is decreased to light touch,on the left.  DTR in the upper and lower extremity are present and symmetric 2+, except on the left 3+. No clonus is noted.  Patient arises from chair without difficulty. Narrow based gait with normal arm swing bilateral , able to stand on heels and toes . Pronator drift on the left. Rhomberg negative.  Spastic gait left side, improved compared to last visit         Assessment & Plan:  1. Right thalamic hemorrhage with left  hemiparesis, dense left hemisensory deficits.  Continue with your exercise program, also incorporate the exercises I showed you today. Encouraged him to continue with his aquatic exercising.  He is recovering exceptional well.  RTC 3 month

## 2013-01-07 NOTE — Progress Notes (Signed)
Please change next follow up visit to my schedule

## 2013-01-16 ENCOUNTER — Other Ambulatory Visit: Payer: Self-pay

## 2013-03-24 ENCOUNTER — Encounter: Payer: Self-pay | Admitting: Nurse Practitioner

## 2013-03-24 ENCOUNTER — Ambulatory Visit (INDEPENDENT_AMBULATORY_CARE_PROVIDER_SITE_OTHER): Payer: BC Managed Care – PPO | Admitting: Nurse Practitioner

## 2013-03-24 VITALS — BP 112/69 | HR 60 | Ht 69.0 in | Wt 178.0 lb

## 2013-03-24 DIAGNOSIS — G8110 Spastic hemiplegia affecting unspecified side: Secondary | ICD-10-CM

## 2013-03-24 NOTE — Progress Notes (Signed)
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  10/02/12 (PS): he returns for followup today after last visit on 04/04/12. He has finished outpatient physical and occupational therapy and states that is quite proud that he can walk independently without a cane. He still complains of stiffness and heaviness of his left leg as well as diminished fine motor skills and mild weakness in his left hand. He also has noticed some return of sensation on the left side with intermittent paresthesias. She states his blood pressure is quite good and in fact today it is 118/71 in office. He plans to start doing water therapy soon.   UPDATE 03/24/13 (LL):  Anthony Hardin returns for ICH follow up after last visit on 10/02/12.  He reports that he is doing well, BP is well controlled, 112/69 today, and he continues to work with a Systems analystpersonal trainer to gain strength and flexibility in his left side.  He does water therapy twice weekly and is regaining some sensation on the left side.   His vision is back to normal.  ROS:  14 system review of systems is positive for positive for gait difficulties, hand weakness, tingling and numbness.  ALLERGIES: No Known Allergies  HOME MEDICATIONS: Outpatient Prescriptions Prior to Visit  Medication Sig Dispense Refill  . amLODipine (NORVASC) 10 MG tablet Take 1 tablet (10 mg total) by mouth daily.  30 tablet  1  . atorvastatin (LIPITOR) 10 MG tablet       . cloNIDine (CATAPRES - DOSED IN MG/24 HR) 0.1 mg/24hr patch Place 1 patch (0.1 mg total) onto the skin once a week.  4 patch  1  . hydrochlorothiazide (HYDRODIURIL) 25 MG tablet Take 1 tablet (25 mg total) by mouth daily.  30 tablet  1  .  lisinopril (PRINIVIL,ZESTRIL) 10 MG tablet Take 1 tablet (10 mg total) by mouth at bedtime.  30 tablet  1  . lisinopril (PRINIVIL,ZESTRIL) 20 MG tablet Take 1 tablet (20 mg total) by mouth daily.  30 tablet  1  . potassium chloride (K-DUR,KLOR-CON) 10 MEQ tablet Take 1 tablet (10 mEq total) by mouth daily.  30 tablet  1   No facility-administered medications prior to visit.    PAST MEDICAL HISTORY: Past Medical History  Diagnosis Date  . Hypertension   . Stroke     PAST SURGICAL HISTORY: No past surgical history on file.  FAMILY HISTORY: Family History  Problem Relation Age of Onset  . Hypertension Mother   . Hypertension Father   . Stroke Paternal Aunt   . Anuerysm Paternal Grandfather     SOCIAL HISTORY: History   Social History  . Marital Status: Single    Spouse Name: N/A    Number of Children: 0  . Years of Education: college   Occupational History  . [employer]    Social History Main Topics  . Smoking status: Never Smoker   . Smokeless tobacco: Never Used  . Alcohol Use: Yes  . Drug Use: No  . Sexual Activity: Not Currently   Other Topics Concern  . Not on file   Social History Narrative   Pt lives at home alone. He does not have any children and has a PHD education level. He quit smoking  1/2 pack of cigarettes in 1980 and quit marijuana use in 1976. He has 1-2 alcohol drinks a week and 2 cups of coffee daily.      PHYSICAL EXAM  Filed Vitals:   03/24/13 1437  BP: 112/69  Pulse: 60  Height: 5\' 9"  (1.753 m)  Weight: 178 lb (80.74 kg)   Body mass index is 26.27 kg/(m^2).  General: well developed, well nourished, seated, in no evident distress  Head: head normocephalic and atraumatic. Orohparynx benign  Neck: supple with no carotid or supraclavicular bruits  Cardiovascular: regular rate and rhythm, no murmurs  Musculoskeletal: no deformity  Skin: no rash/petichiae  Vascular: Normal pulses all extremities  Neurologic Exam  Mental Status: Awake and  fully alert. Oriented to place and time. Recent and remote memory intact. Attention span, concentration and fund of knowledge appropriate. Mood and affect appropriate.  Cranial Nerves:  Pupils equal, briskly reactive to light. Extraocular movements full without nystagmus. Visual fields full to confrontation. Hearing intact. Facial sensation intact. Face, tongue, palate moves normally and symmetrically.  Motor: Mild 4/5 strength on the left with increased tone and weakness of the left grip, intrinsic hand muscles and left ankle dorsiflexors. Mild left foot drop.  Sensory: Diminished left hemisensory touch and pinprick sensation.  Coordination: Impaired on the left side  Gait and Station: Arises from chair without difficulty. Stance is normal. Spastic hemiparetic gait with left foot drop and some stiffness of the left leg.  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   PLAN:  Continue strict control of hypertension with blood pressure goal below 130/90.  Continue water exercise and working with Systems analyst. Check Carotid Dopplers, patient states last study was 3-4 years ago. Return for followup in 1 year.  Orders Placed This Encounter  Procedures  . US Carotid Duplex Bilateral   Return in about 1 year (around 03/24/2014).  Ronal Fear, MSN, NP-C 03/24/2013, 3:16 PM [facility] [address], [address] [place], [place] [address] [phone]  Note: This document was prepared with digital dictation and possible smart phrase technology. Any transcriptional errors that result from this process are unintentional.

## 2013-03-24 NOTE — Patient Instructions (Signed)
PLAN:  Continue strict control of hypertension with blood pressure goal below 130/90.  Return for followup in 1 year.

## 2013-04-03 ENCOUNTER — Other Ambulatory Visit: Payer: Self-pay | Admitting: Internal Medicine

## 2013-04-03 ENCOUNTER — Ambulatory Visit: Payer: BC Managed Care – PPO | Admitting: Physical Medicine and Rehabilitation

## 2013-04-03 DIAGNOSIS — I6529 Occlusion and stenosis of unspecified carotid artery: Secondary | ICD-10-CM

## 2013-04-03 DIAGNOSIS — Z8673 Personal history of transient ischemic attack (TIA), and cerebral infarction without residual deficits: Secondary | ICD-10-CM

## 2013-04-08 ENCOUNTER — Ambulatory Visit
Admission: RE | Admit: 2013-04-08 | Discharge: 2013-04-08 | Disposition: A | Discharge status: Home / Self Care | Payer: BC Managed Care – PPO | Source: Ambulatory Visit | Attending: Internal Medicine | Admitting: Internal Medicine

## 2013-04-08 DIAGNOSIS — Z8673 Personal history of transient ischemic attack (TIA), and cerebral infarction without residual deficits: Secondary | ICD-10-CM

## 2013-04-08 DIAGNOSIS — I6529 Occlusion and stenosis of unspecified carotid artery: Secondary | ICD-10-CM

## 2013-04-15 ENCOUNTER — Ambulatory Visit (HOSPITAL_BASED_OUTPATIENT_CLINIC_OR_DEPARTMENT_OTHER): Payer: BC Managed Care – PPO | Admitting: Physical Medicine & Rehabilitation

## 2013-04-15 ENCOUNTER — Encounter: Payer: Self-pay | Admitting: Physical Medicine & Rehabilitation

## 2013-04-15 ENCOUNTER — Encounter: Payer: BC Managed Care – PPO | Attending: Physical Medicine & Rehabilitation

## 2013-04-15 VITALS — BP 144/88 | HR 78 | Resp 14 | Ht 69.0 in | Wt 178.0 lb

## 2013-04-15 DIAGNOSIS — I610 Nontraumatic intracerebral hemorrhage in hemisphere, subcortical: Secondary | ICD-10-CM

## 2013-04-15 DIAGNOSIS — I10 Essential (primary) hypertension: Secondary | ICD-10-CM | POA: Insufficient documentation

## 2013-04-15 DIAGNOSIS — I69959 Hemiplegia and hemiparesis following unspecified cerebrovascular disease affecting unspecified side: Secondary | ICD-10-CM | POA: Insufficient documentation

## 2013-04-15 DIAGNOSIS — I619 Nontraumatic intracerebral hemorrhage, unspecified: Secondary | ICD-10-CM

## 2013-04-15 DIAGNOSIS — I69993 Ataxia following unspecified cerebrovascular disease: Secondary | ICD-10-CM | POA: Insufficient documentation

## 2013-04-15 DIAGNOSIS — R209 Unspecified disturbances of skin sensation: Secondary | ICD-10-CM

## 2013-04-15 DIAGNOSIS — I69998 Other sequelae following unspecified cerebrovascular disease: Secondary | ICD-10-CM

## 2013-04-15 DIAGNOSIS — Z87891 Personal history of nicotine dependence: Secondary | ICD-10-CM | POA: Insufficient documentation

## 2013-04-15 NOTE — Patient Instructions (Signed)
Try 2 handed forehand

## 2013-04-15 NOTE — Progress Notes (Signed)
   Subjective:    Patient ID: Anthony Hardin, male    DOB: May 20, 1950, 63 y.o.   MRN: [id number] Left hand dominant HPI August 19,2013, right thalamic hemorrhage Pool therapy 2 days a week Home ex with trainer 2-3 days oer week Ex bike  Pain Inventory Average Pain 0 Pain Right Now 0 My pain is n/a  In the last 24 hours, has pain interfered with the following? General activity 0 Relation with others 0 Enjoyment of life 0 What TIME of day is your pain at its worst? n/a Sleep (in general) Good  Pain is worse with: n/a Pain improves with: n/a Relief from Meds: n/a  Mobility walk with assistance how many minutes can you walk? 40 ability to climb steps?  yes do you drive?  yes Do you have any goals in this area?  yes  Function employed # of hrs/week 40 Do you have any goals in this area?  yes  Neuro/Psych No problems in this area  Prior Studies Any changes since last visit?  no  Physicians involved in your care Seddi, Hussain   Family History  Problem Relation Age of Onset  . Hypertension Mother   . Hypertension Father   . Stroke Paternal Aunt   . Anuerysm Paternal Grandfather    History   Social History  . Marital Status: Single    Spouse Name: N/A    Number of Children: 0  . Years of Education: college   Occupational History  . [employer]    Social History Main Topics  . Smoking status: Never Smoker   . Smokeless tobacco: Never Used  . Alcohol Use: Yes  . Drug Use: No  . Sexual Activity: Not Currently   Other Topics Concern  . None   Social History Narrative   Pt lives at home alone. He does not have any children and has a PHD education level. He quit smoking 1/2 pack of cigarettes in 1980 and quit marijuana use in 1976. He has 1-2 alcohol drinks a week and 2 cups of coffee daily.    History reviewed. No pertinent past surgical history. Past Medical History  Diagnosis Date  . Hypertension   . Stroke    BP 144/88  Pulse 78  Resp 14  Ht 5'  9" (1.753 m)  Wt 178 lb (80.74 kg)  BMI 26.27 kg/m2  SpO2 97%  Opioid Risk Score:   Fall Risk Score: Moderate Fall Risk (6-13 points) (patient educated hangout given)   Review of Systems  Musculoskeletal: Positive for gait problem.  All other systems reviewed and are negative.       Objective:   Physical Exam  Intact sharp and LT on Left hand Reduced proprio 4/5 Delt bi tri grip Left foot reduced pin, LT, proprio Decreased fine motor left upper extremity Mild dysdiadochokinesis left upper extremity Mood and affect are appropriate     Assessment & Plan:  1.  right thalamic hemorrhage -Left hemiparesis, left hemi-ataxia, left hemi-sensory deficits. Has made some improvement with gross motor function since last visit. Some improvement in upper extremity sensory but not with lower extremity sensory  Continue home exercise programs Continue primary M.D. followup  RTC 4 months

## 2013-04-16 ENCOUNTER — Other Ambulatory Visit: Payer: Self-pay

## 2013-08-18 ENCOUNTER — Encounter: Payer: Self-pay | Admitting: Physical Medicine & Rehabilitation

## 2013-08-18 ENCOUNTER — Encounter: Payer: BC Managed Care – PPO | Attending: Physical Medicine & Rehabilitation

## 2013-08-18 ENCOUNTER — Ambulatory Visit (HOSPITAL_BASED_OUTPATIENT_CLINIC_OR_DEPARTMENT_OTHER): Payer: BC Managed Care – PPO | Admitting: Physical Medicine & Rehabilitation

## 2013-08-18 VITALS — BP 146/60 | HR 76 | Resp 14 | Ht 69.0 in | Wt 178.0 lb

## 2013-08-18 DIAGNOSIS — I69993 Ataxia following unspecified cerebrovascular disease: Secondary | ICD-10-CM

## 2013-08-18 DIAGNOSIS — I619 Nontraumatic intracerebral hemorrhage, unspecified: Secondary | ICD-10-CM

## 2013-08-18 DIAGNOSIS — Z87891 Personal history of nicotine dependence: Secondary | ICD-10-CM | POA: Insufficient documentation

## 2013-08-18 DIAGNOSIS — I69959 Hemiplegia and hemiparesis following unspecified cerebrovascular disease affecting unspecified side: Secondary | ICD-10-CM | POA: Insufficient documentation

## 2013-08-18 DIAGNOSIS — I10 Essential (primary) hypertension: Secondary | ICD-10-CM | POA: Insufficient documentation

## 2013-08-18 DIAGNOSIS — R209 Unspecified disturbances of skin sensation: Principal | ICD-10-CM

## 2013-08-18 DIAGNOSIS — I610 Nontraumatic intracerebral hemorrhage in hemisphere, subcortical: Secondary | ICD-10-CM

## 2013-08-18 DIAGNOSIS — I69998 Other sequelae following unspecified cerebrovascular disease: Secondary | ICD-10-CM

## 2013-08-18 NOTE — Patient Instructions (Signed)
[facility] Dept of PT has a Consulting civil engineer PT clinic that may be something worth exploring   Co Contraction is the simultaneous contraction of agonist and antagonist muscle eg biceps and triceps

## 2013-08-18 NOTE — Progress Notes (Signed)
Subjective:    Patient ID: Anthony Hardin, male    DOB: 06-Oct-1950, 63 y.o.   MRN: [id number] Left hand dominant  HPI August 19,2013, right thalamic hemorrhage  Pool therapy 2 days a week  Home ex with trainer 2-3 days oer week  Ex bike Using therabands Working on Leg muscles Pain Inventory Average Pain 0 Pain Right Now 0 My pain is n/a  In the last 24 hours, has pain interfered with the following? General activity 0 Relation with others 0 Enjoyment of life 0 What TIME of day is your pain at its worst? n/a Sleep (in general) Fair  Pain is worse with: n/a Pain improves with: n/a Relief from Meds: n/a  Mobility walk without assistance how many minutes can you walk? 40 ability to climb steps?  yes do you drive?  yes Do you have any goals in this area?  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  yes  Neuro/Psych weakness numbness trouble walking  Prior Studies Any changes since last visit?  no  Physicians involved in your care Any changes since last visit?  no   Family History  Problem Relation Age of Onset  . Hypertension Mother   . Hypertension Father   . Stroke Paternal Aunt   . Anuerysm Paternal Grandfather    History   Social History  . Marital Status: Single    Spouse Name: N/A    Number of Children: 0  . Years of Education: college   Occupational History  . [employer]    Social History Main Topics  . Smoking status: Never Smoker   . Smokeless tobacco: Never Used  . Alcohol Use: Yes  . Drug Use: No  . Sexual Activity: Not Currently   Other Topics Concern  . None   Social History Narrative   Pt lives at home alone. He does not have any children and has a PHD education level. He quit smoking 1/2 pack of cigarettes in 1980 and quit marijuana use in 1976. He has 1-2 alcohol drinks a week and 2 cups of coffee daily.    History reviewed. No pertinent past surgical history. Past Medical  History  Diagnosis Date  . Hypertension   . Stroke    BP 146/60  Pulse 76  Resp 14  Ht 5\' 9"  (1.753 m)  Wt 178 lb (80.74 kg)  BMI 26.27 kg/m2  SpO2 96%  Opioid Risk Score:   Fall Risk Score: Moderate Fall Risk (6-13 points) (patient educated handout declined)   Review of Systems  Musculoskeletal: Positive for gait problem.  Neurological: Positive for weakness and numbness.  All other systems reviewed and are negative.      Objective:   Physical Exam  5/5 strength on the right side 4/5 strength in the left upper and left lower extremity  Tone is increased in the left upper and left lower tremor the Evidence of cocontraction in the arm as well as thigh and leg muscles during active movement  Sensation is absent to light touch, proprioception and pinprick sensation in the left hand and the left foot Gait shows cocontraction of the hamstring and quadriceps during ambulation stifflegged gait. No evidence of toe drag Moderate elbow flexion during gait      Assessment & Plan:  1. 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 I expect that he can  still build strength and endurance but do not feel he will regain much in terms of sensation.  Return to clinic 6 months Discussed the concept of cocontraction Discussed [employer] physical therapy clinic for students  Over half of the 25 min visit was spent counseling and coordinating care.

## 2013-08-29 IMAGING — CT CT HEAD W/O CM
1 series · 16 of 30 positions shown, 20 images · non-contrast
Comparison: CT 10/30/2011

CLINICAL DATA: Intracranial hemorrhage

CT HEAD WITHOUT CONTRAST
TECHNIQUE: Contiguous axial images were obtained from the base of
the skull through the vertex without contrast.

[Series 2: head routine 4.8 h37s · axial · 0.43mm/px · z∈[+1175,+1332]mm · 16 of 36 slices shown, 20 images]
[im 2/36  brain]
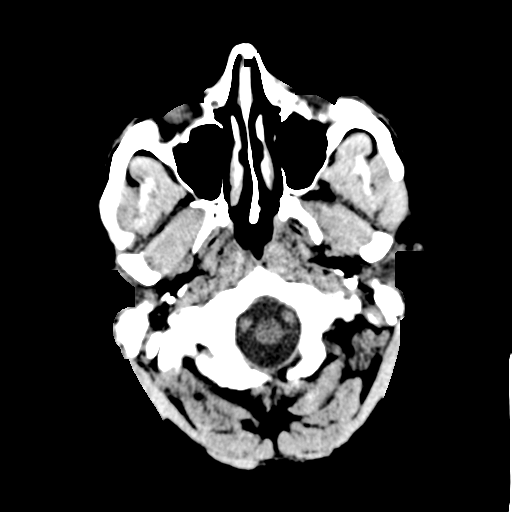
[im 2/36  bone]
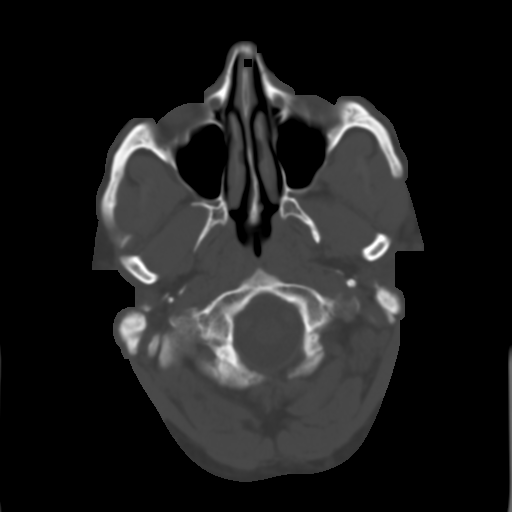
[im 4/36  brain]
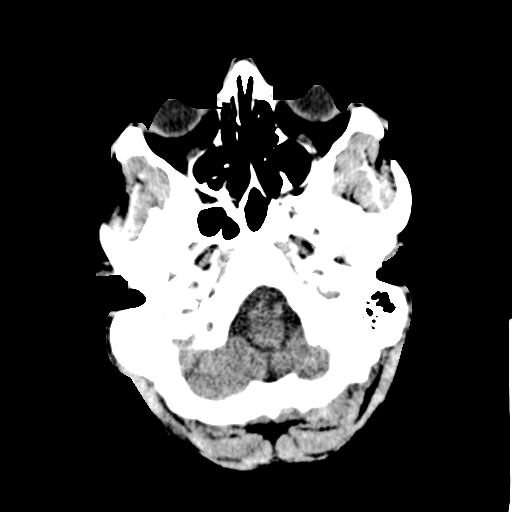
[im 7/36  brain]
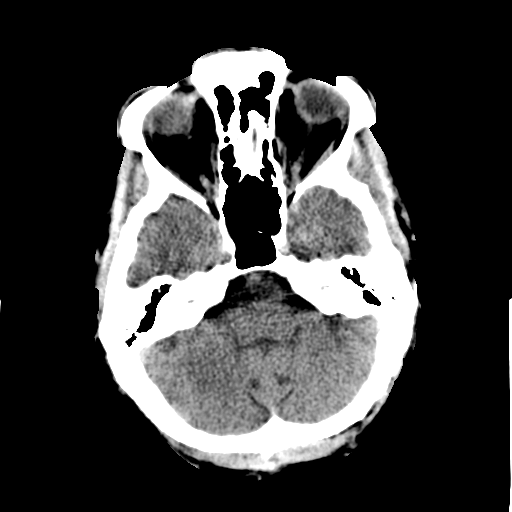
[im 9/36  brain]
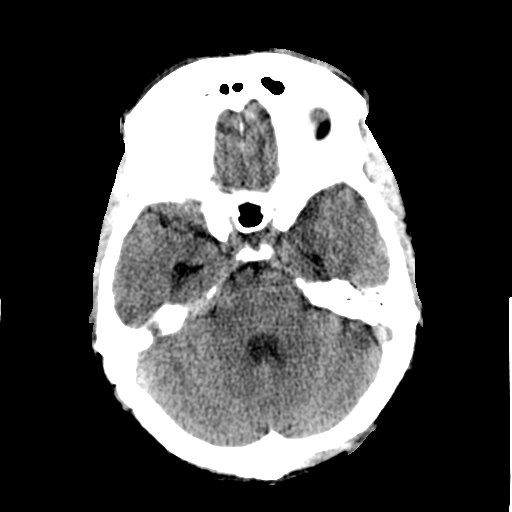
[im 10/36  brain]
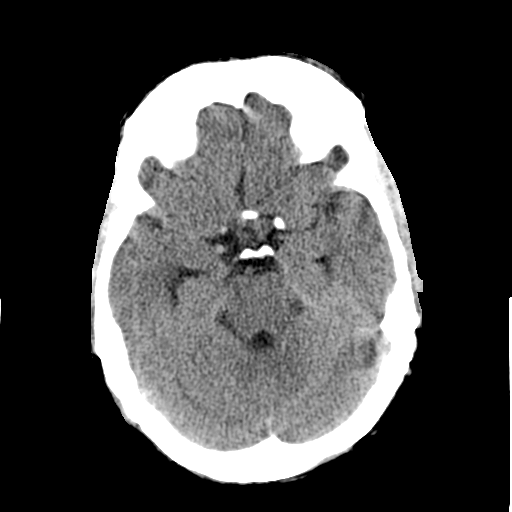
[im 10/36  bone]
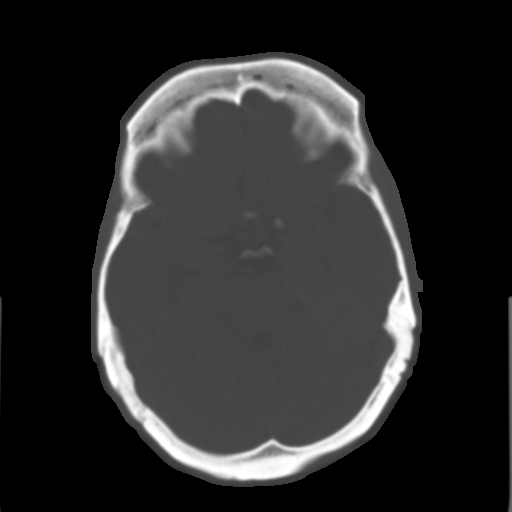
[im 13/36  brain]
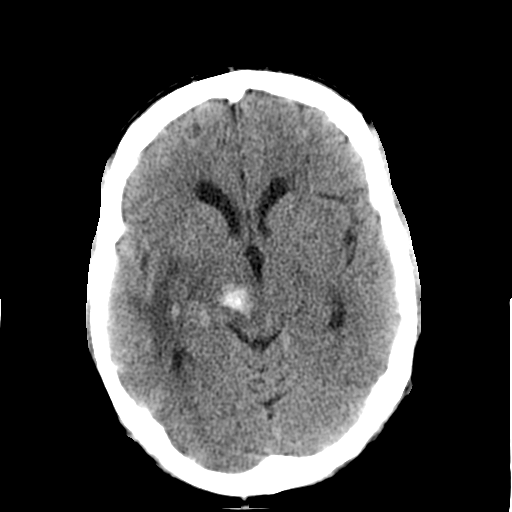
[im 15/36  brain]
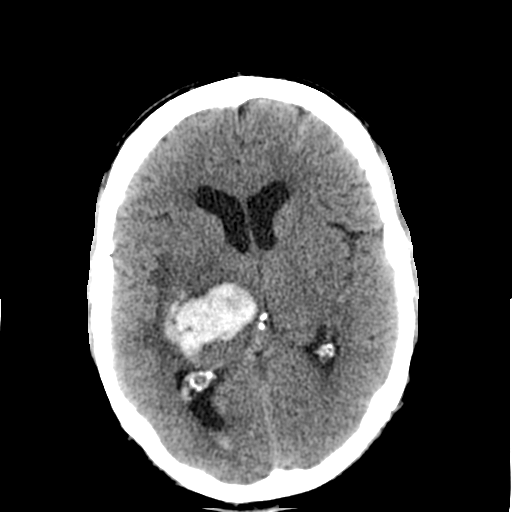
[im 17/36  brain]
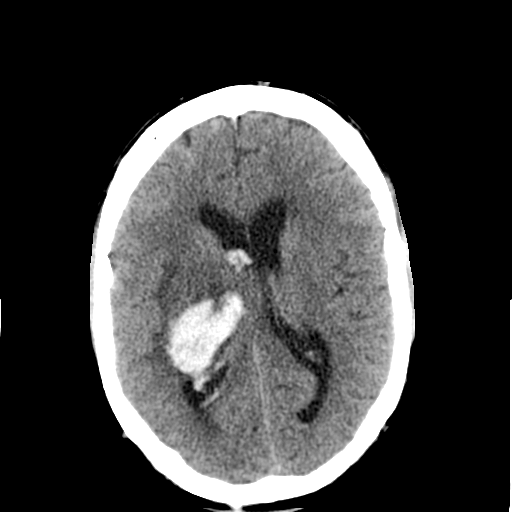
[im 19/36  brain]
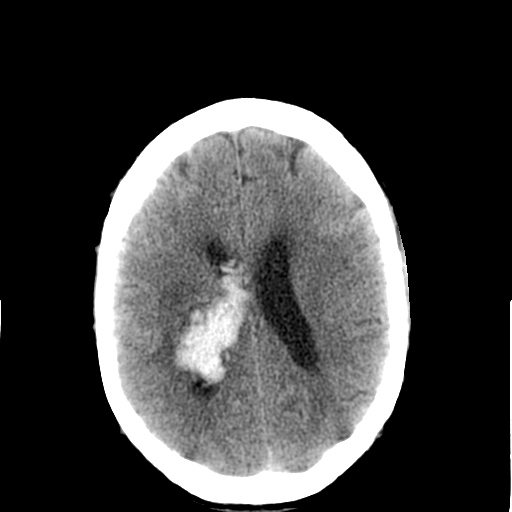
[im 19/36  bone]
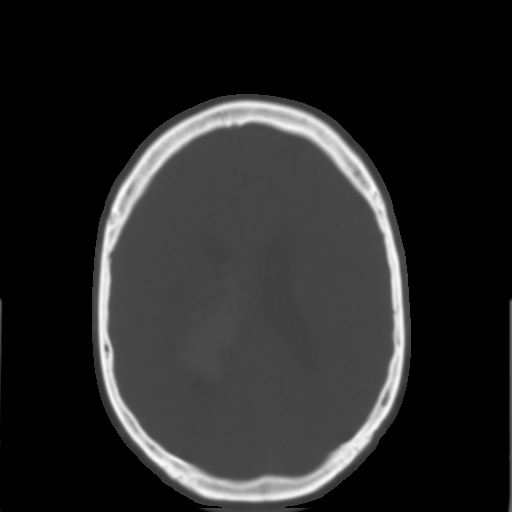
[im 21/36  brain]
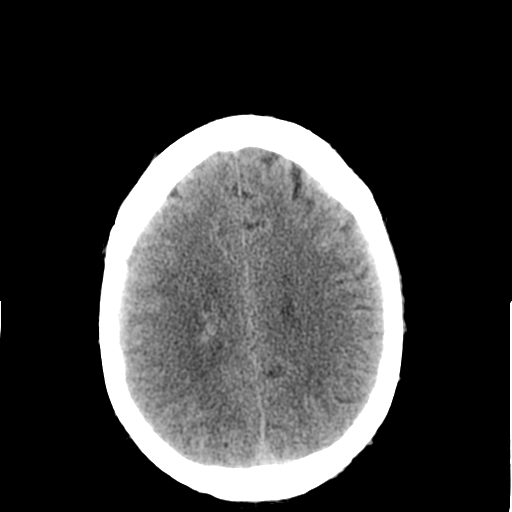
[im 23/36  brain]
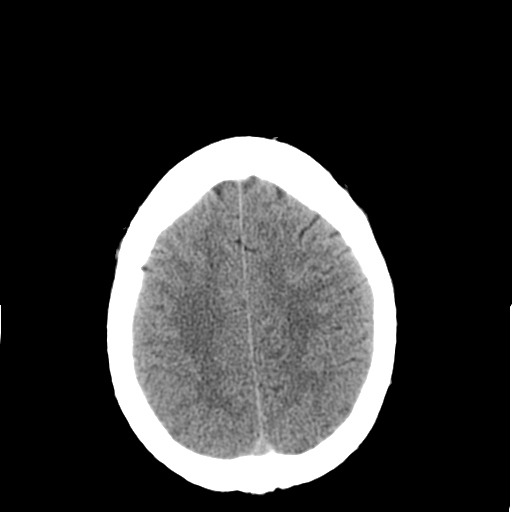
[im 26/36  brain]
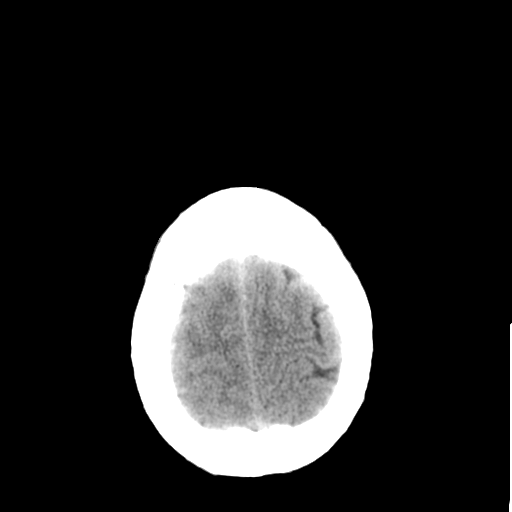
[im 27/36  brain]
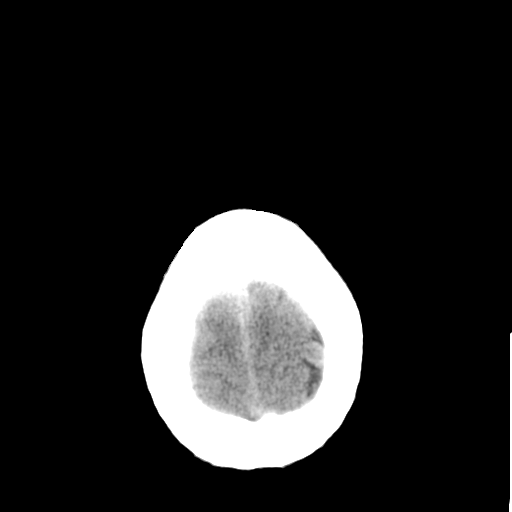
[im 27/36  bone]
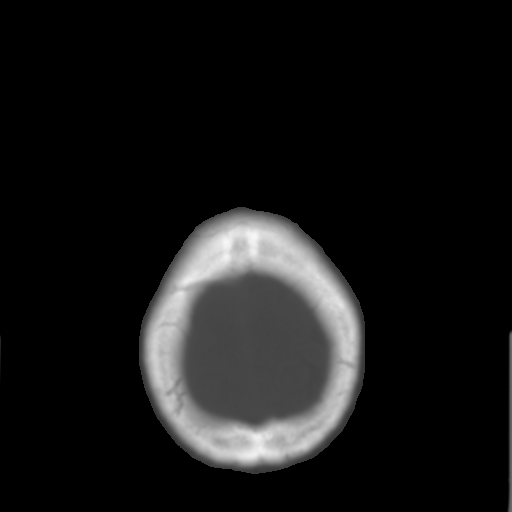
[im 29/36  brain]
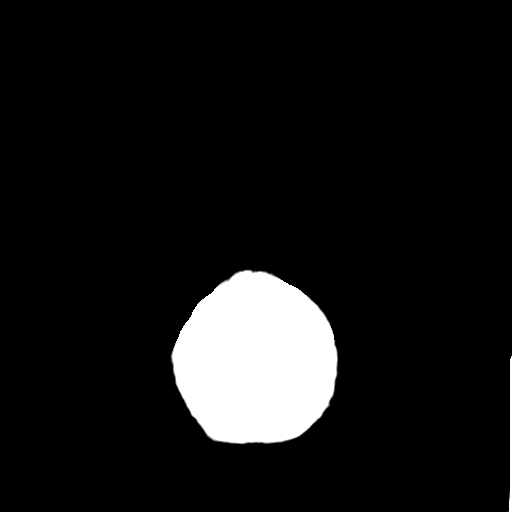
[im 32/36  brain]
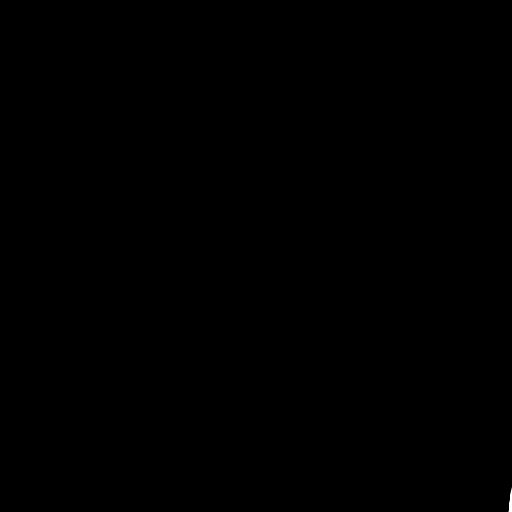
[im 34/36  brain]
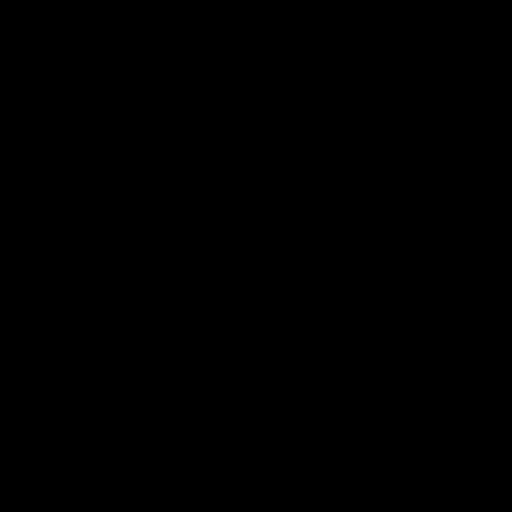

[16 of 30 positions shown; findings below may reference images not displayed]

FINDINGS: Right thalamic hemorrhage is unchanged measuring 41 x 23
mm.  Mild intraventricular hemorrhage is again noted.  Slight
ventricular enlargement is stable.  Mild midline shift
approximately 3 mm to the left is unchanged.  Low density edema is
seen around the thalamic hemorrhage which is similar.  No acute
infarct or mass.  Chronic lacuna in the left basal ganglia.  Small
chronic infarct in the left medial parietal lobe.
IMPRESSION: Right thalamic hemorrhage with ventricular extension.  No
significant change from 10/30/2011.

## 2013-08-31 IMAGING — CT CT HEAD W/O CM
2 series · 15 of 30 positions shown, 19 images · non-contrast
Comparison: November 01, 2011.

CLINICAL DATA: Altered mental status, intracranial hemorrhage.

CT HEAD WITHOUT CONTRAST
TECHNIQUE: Contiguous axial images were obtained from the base of
the skull through the vertex without contrast.

[Series 2: head w/o · axial · non-contrast · 0.45mm/px · z∈[+237,+367]mm · 13 of 32 slices shown, 17 images]
[im 3/32  brain]
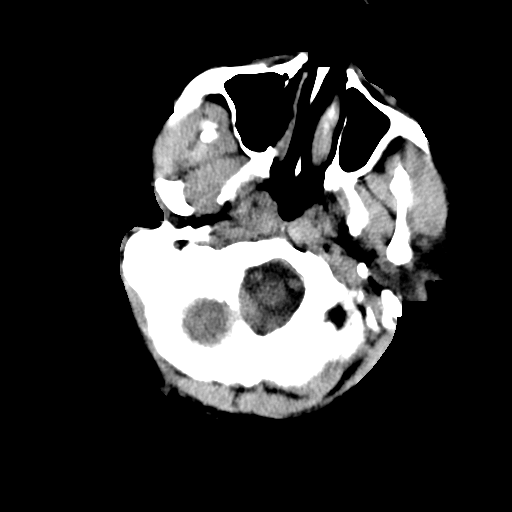
[im 3/32  bone]
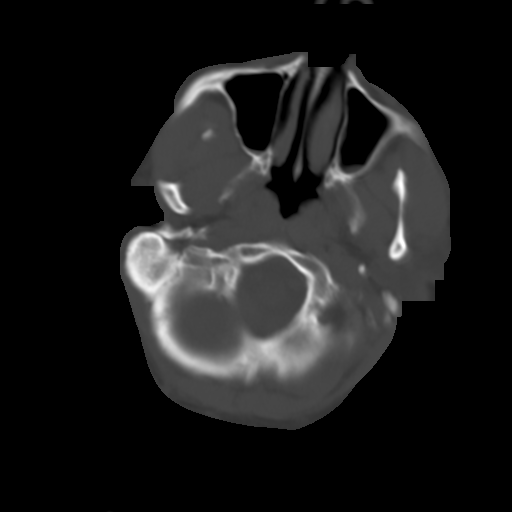
[im 5/32  brain]
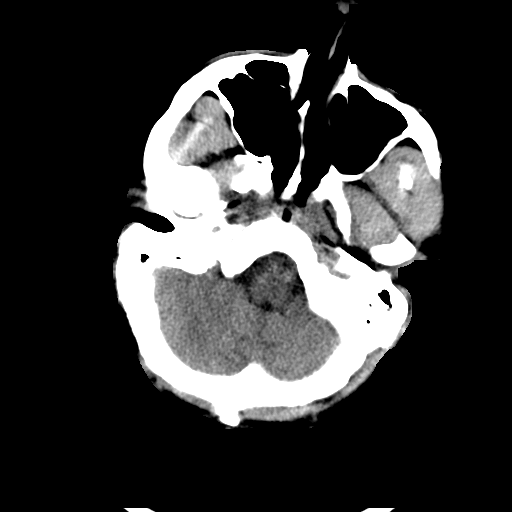
[im 7/32  brain]
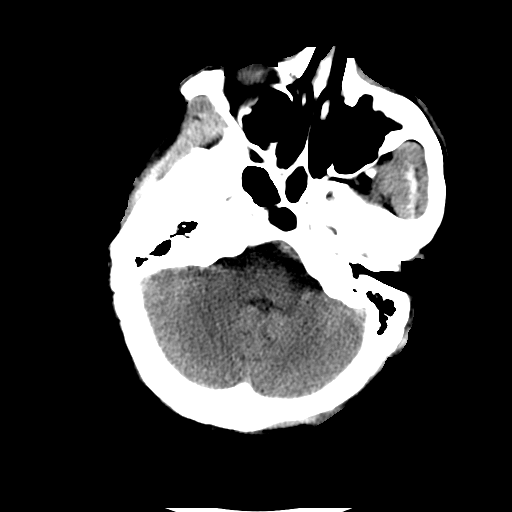
[im 9/32  brain]
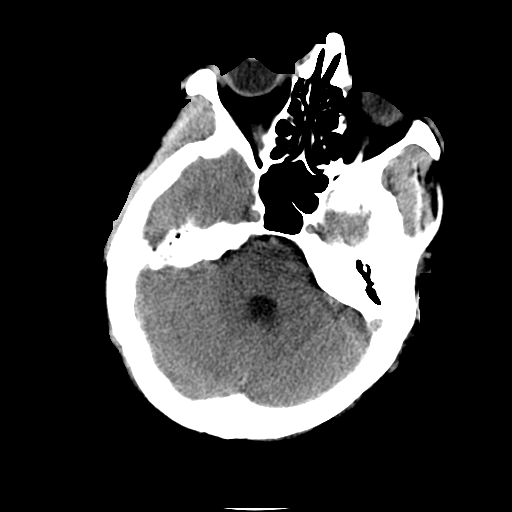
[im 12/32  brain]
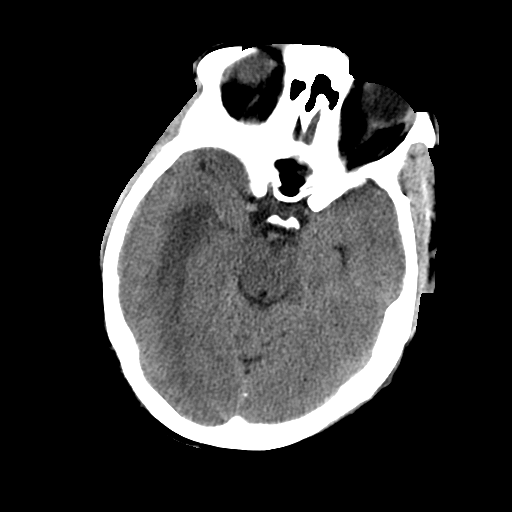
[im 12/32  bone]
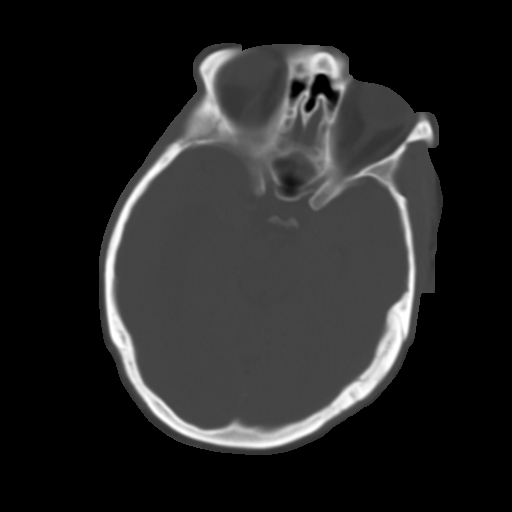
[im 14/32  brain]
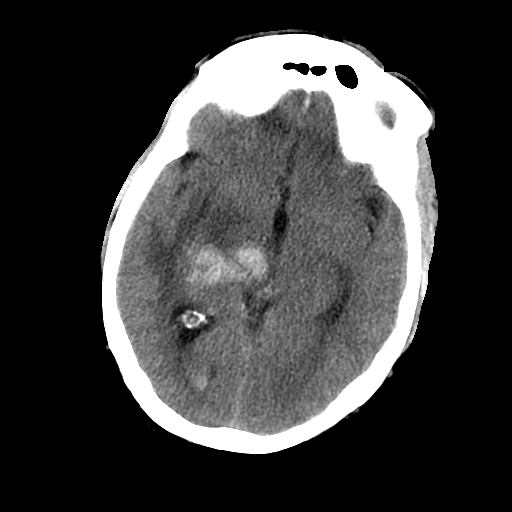
[im 16/32  brain]
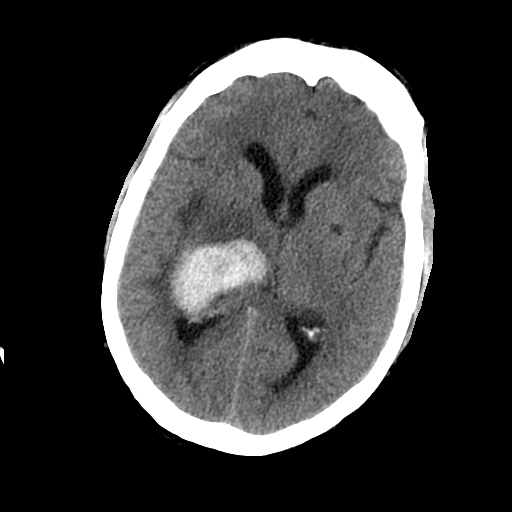
[im 18/32  brain]
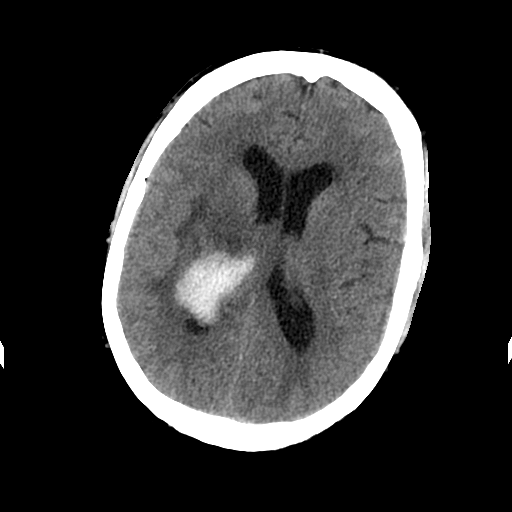
[im 20/32  brain]
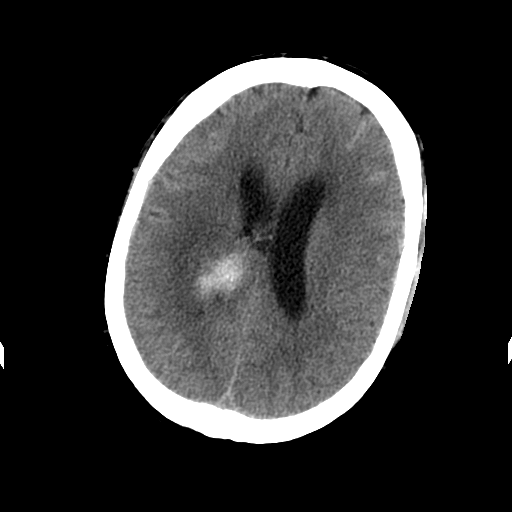
[im 20/32  bone]
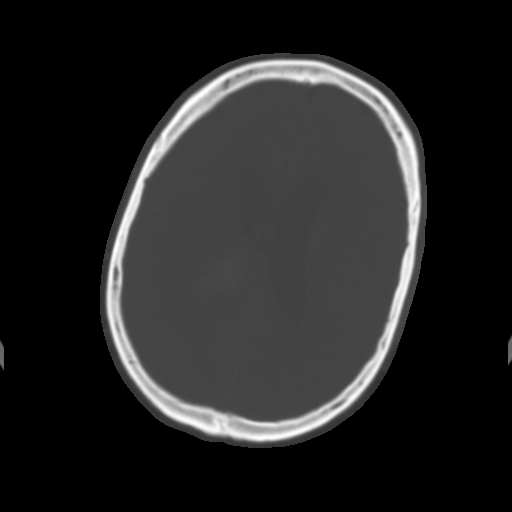
[im 23/32  brain]
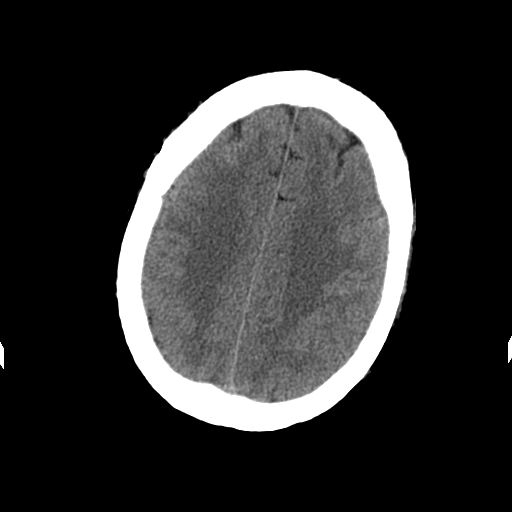
[im 25/32  brain]
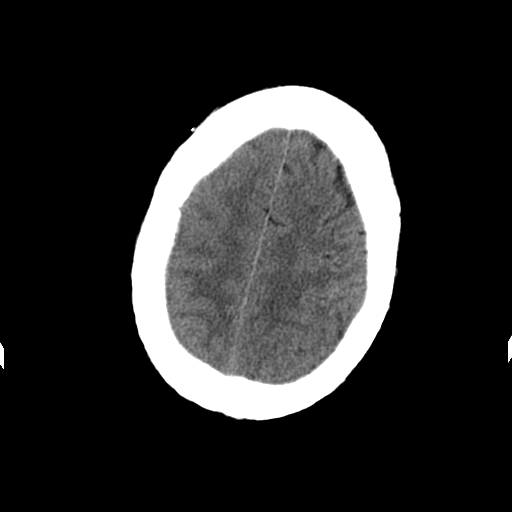
[im 27/32  brain]
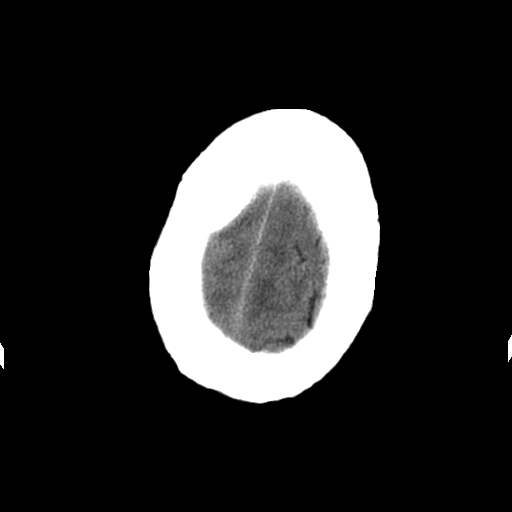
[im 29/32  brain]
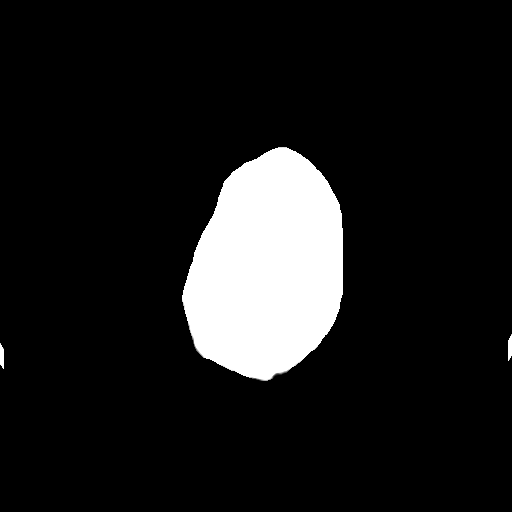
[im 29/32  bone]
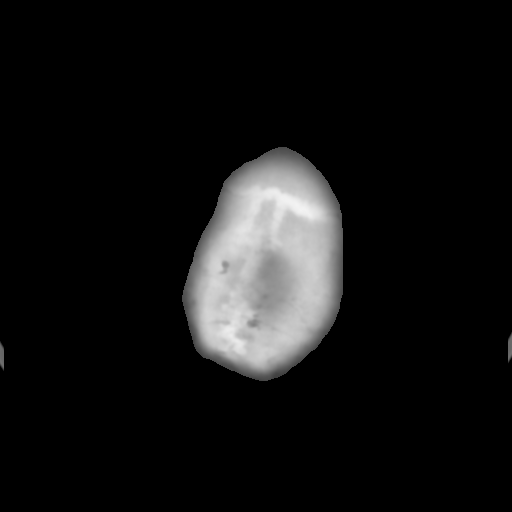

[Series 3: head w/o bone · axial · non-contrast · 0.45mm/px · z∈[+237,+257]mm · 2 of 32 slices shown]
[im 3/32  bone]
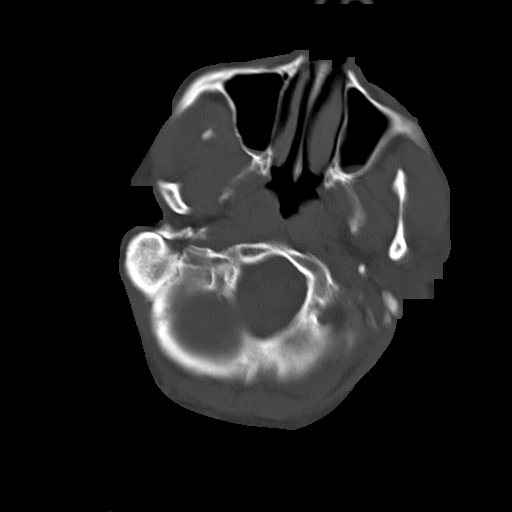
[im 7/32  bone]
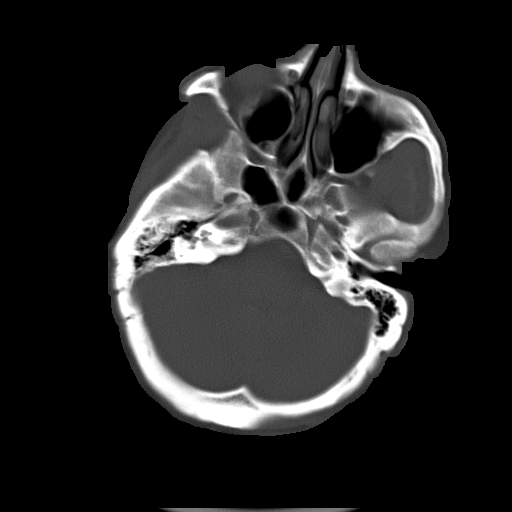

[15 of 30 positions shown; findings below may reference images not displayed]

FINDINGS: Bony calvarium appears intact.  There is no change seen
involving large intraparenchymal hemorrhage measuring 4.1 x 2.3 cm
involving the right thalamus.  There is a decreased amount of
hemorrhage seen within the right lateral ventricle with continued
presence of hemorrhage in the right occipital horn.  No significant
change is seen involving the vasogenic edema surrounding the
hemorrhage.  Probable small amount of subarachnoid hemorrhage is
seen involving the left frontal lobe which is not significantly
changed compared to prior exam.
IMPRESSION: No significant change seen involving large intraparenchymal
hemorrhage in the right thalamus compared to prior exam.  There
does appear to be a decreased amount of hemorrhage within the right
lateral ventricle compared to prior exam.  No change is seen
involving small amount of hemorrhage in right occipital horn or
small amount of subarachnoid hemorrhage noted previously.

## 2013-11-05 ENCOUNTER — Emergency Department (HOSPITAL_COMMUNITY): Payer: BC Managed Care – PPO

## 2013-11-05 ENCOUNTER — Encounter (HOSPITAL_COMMUNITY): Payer: Self-pay | Admitting: Emergency Medicine

## 2013-11-05 ENCOUNTER — Emergency Department (HOSPITAL_COMMUNITY)
Admission: EM | Admit: 2013-11-05 | Discharge: 2013-11-05 | Disposition: A | Discharge status: Home / Self Care | Payer: BC Managed Care – PPO | Attending: Emergency Medicine | Admitting: Emergency Medicine

## 2013-11-05 DIAGNOSIS — Z8673 Personal history of transient ischemic attack (TIA), and cerebral infarction without residual deficits: Secondary | ICD-10-CM | POA: Insufficient documentation

## 2013-11-05 DIAGNOSIS — Z79899 Other long term (current) drug therapy: Secondary | ICD-10-CM | POA: Diagnosis not present

## 2013-11-05 DIAGNOSIS — R1084 Generalized abdominal pain: Secondary | ICD-10-CM | POA: Diagnosis present

## 2013-11-05 DIAGNOSIS — R143 Flatulence: Secondary | ICD-10-CM | POA: Diagnosis not present

## 2013-11-05 DIAGNOSIS — R110 Nausea: Secondary | ICD-10-CM | POA: Diagnosis not present

## 2013-11-05 DIAGNOSIS — R141 Gas pain: Secondary | ICD-10-CM | POA: Diagnosis not present

## 2013-11-05 DIAGNOSIS — R142 Eructation: Principal | ICD-10-CM

## 2013-11-05 DIAGNOSIS — R140 Abdominal distension (gaseous): Secondary | ICD-10-CM

## 2013-11-05 DIAGNOSIS — I10 Essential (primary) hypertension: Secondary | ICD-10-CM | POA: Insufficient documentation

## 2013-11-05 LAB — COMPREHENSIVE METABOLIC PANEL
ALBUMIN: 3.8 g/dL (ref 3.5–5.2)
ALT: 54 U/L — ABNORMAL HIGH (ref 0–53)
ANION GAP: 11 (ref 5–15)
AST: 32 U/L (ref 0–37)
Alkaline Phosphatase: 36 U/L — ABNORMAL LOW (ref 39–117)
BILIRUBIN TOTAL: 0.5 mg/dL (ref 0.3–1.2)
BUN: 14 mg/dL (ref 6–23)
CO2: 24 meq/L (ref 19–32)
CREATININE: 1 mg/dL (ref 0.50–1.35)
Calcium: 9.1 mg/dL (ref 8.4–10.5)
Chloride: 106 mEq/L (ref 96–112)
GFR calc Af Amer: >90 mL/min (ref >90)
GFR, EST NON AFRICAN AMERICAN: 79 mL/min — AB (ref >90)
Glucose, Bld: 117 mg/dL — ABNORMAL HIGH (ref 70–99)
Potassium: 4.7 mEq/L (ref 3.7–5.3)
Sodium: 141 mEq/L (ref 137–147)
Total Protein: 6.8 g/dL (ref 6.0–8.3)

## 2013-11-05 LAB — CBC WITH DIFFERENTIAL/PLATELET
BASOS ABS: 0 10*3/uL (ref 0.0–0.1)
BASOS PCT: 0 % (ref 0–1)
Eosinophils Absolute: 0 10*3/uL (ref 0.0–0.7)
Eosinophils Relative: 0 % (ref 0–5)
HCT: 40.6 % (ref 39.0–52.0)
Hemoglobin: 14.2 g/dL (ref 13.0–17.0)
Lymphocytes Relative: 10 % — ABNORMAL LOW (ref 12–46)
Lymphs Abs: 0.9 10*3/uL (ref 0.7–4.0)
MCH: 30.7 pg (ref 26.0–34.0)
MCHC: 35 g/dL (ref 30.0–36.0)
MCV: 87.9 fL (ref 78.0–100.0)
MONO ABS: 0.6 10*3/uL (ref 0.1–1.0)
MONOS PCT: 7 % (ref 3–12)
Neutro Abs: 6.9 10*3/uL (ref 1.7–7.7)
Neutrophils Relative %: 83 % — ABNORMAL HIGH (ref 43–77)
Platelets: 196 10*3/uL (ref 150–400)
RBC: 4.62 MIL/uL (ref 4.22–5.81)
RDW: 13.3 % (ref 11.5–15.5)
WBC: 8.4 10*3/uL (ref 4.0–10.5)

## 2013-11-05 MED ORDER — MORPHINE SULFATE 2 MG/ML IJ SOLN: 2.0000 mg | Freq: Once | INTRAMUSCULAR | Status: DC

## 2013-11-05 MED ORDER — IOHEXOL 300 MG/ML  SOLN
25.0000 mL | INTRAMUSCULAR | Status: AC
  Administered 2013-11-05: 25 mL via ORAL

## 2013-11-05 MED ORDER — IOHEXOL 300 MG/ML  SOLN
80.0000 mL | Freq: Once | INTRAMUSCULAR | Status: AC | PRN
  Administered 2013-11-05: 80 mL via INTRAVENOUS

## 2013-11-05 MED ORDER — ONDANSETRON HCL 4 MG/2ML IJ SOLN
4.0000 mg | Freq: Once | INTRAMUSCULAR | Status: AC
  Administered 2013-11-05: 4 mg via INTRAVENOUS
  Filled 2013-11-05: qty 2

## 2013-11-05 NOTE — H&P (Signed)
Problem: Post colonoscopy abdominal pain  History: The patient is a 63 year old male Jan 13, 1951. The patient underwent a surveillance colonoscopy this morning with removal of a 5 mm sigmoid colon polyp with the  cold snare. Post colonoscopy, the patient had difficulty passing the insufflated air and developed nausea with abdominal discomfort.  The patient is admitted to the emergency room to rule out colonic perforation post colonoscopy.  Medication allergies: Aspirin  Past medical history: Hypertension. Right thalamic hemorrhagic stroke in August 2013. Adenomatous colon polyps removed colonoscopically in July 2014. Hypercholesterolemia. No surgeries.  Chronic medications: Atorvastatin. Ocuvite. Lisinopril. Hydrochlorothiazide. Amlodipine. Clonidine. Cialis.  Family history: Negative for colon cancer  Habits: The patient quit smoking cigarettes in 1979. He consumes alcohol in moderation.  Exam: The patient is alert and lying comfortably on the emergency room stretcher. Abdomen is distended and hyperresonant to percussion. He reports no abdominal pain to palpation in all quadrants. Cardiac exam reveals a regular rhythm. Lungs are clear to auscultation.  Assessment: Post colonoscopy abdominal pain rule out colonic perforation  Plan: The patient will undergo an acute abdominal x-ray series.

## 2013-11-05 NOTE — ED Provider Notes (Signed)
1600 - Patient care from Dr. Loretha Stapler. 86M here s/p colonscopy with abdominal pain, awaiting CT to r/o perforation.  CT negative for perf, patient feeling much better, refused outpatient Rx for pain meds. Stable for discharge.  1. Abdominal bloating      Elwin Mocha, MD 11/06/13 [phone]

## 2013-11-05 NOTE — ED Notes (Signed)
MD at bedside. 

## 2013-11-05 NOTE — ED Provider Notes (Signed)
CSN: [id number]     Arrival date & time 11/05/13  1142 History   First MD Initiated Contact with Patient 11/05/13 1155     Chief Complaint  Patient presents with  . Abdominal Pain     (Consider location/radiation/quality/duration/timing/severity/associated sxs/prior Treatment) Patient is a 63 y.o. male presenting with abdominal pain.  Abdominal Pain Pain location:  Generalized Pain quality: bloating and fullness   Pain radiates to:  Does not radiate Pain severity:  Moderate Onset quality:  Gradual Duration:  2 hours Timing:  Constant Progression:  Worsening Chronicity:  New Context comment:  Post colonoscopy.  Developed worsening abdominal pain in few hours since. Relieved by:  Nothing Worsened by:  Nothing tried Associated symptoms: nausea   Associated symptoms: no fever and no vomiting     Past Medical History  Diagnosis Date  . Hypertension   . Stroke    History reviewed. No pertinent past surgical history. Family History  Problem Relation Age of Onset  . Hypertension Mother   . Hypertension Father   . Stroke Paternal Aunt   . Anuerysm Paternal Grandfather    History  Substance Use Topics  . Smoking status: Never Smoker   . Smokeless tobacco: Never Used  . Alcohol Use: 1.2 oz/week    2 Cans of beer per week    Review of Systems  Constitutional: Negative for fever.  Gastrointestinal: Positive for nausea and abdominal pain. Negative for vomiting.  All other systems reviewed and are negative.     Allergies  Review of patient's allergies indicates no known allergies.  Home Medications   Prior to Admission medications   Medication Sig Start Date End Date Taking? Authorizing Provider  amLODipine (NORVASC) 10 MG tablet Take 1 tablet (10 mg total) by mouth daily. 03/22/12   Erick Colace, MD  atorvastatin (LIPITOR) 10 MG tablet  11/08/12   Historical Provider, MD  cloNIDine (CATAPRES - DOSED IN MG/24 HR) 0.1 mg/24hr patch Place 1 patch (0.1 mg total)  onto the skin once a week. 02/21/12   Erick Colace, MD  hydrochlorothiazide (HYDRODIURIL) 25 MG tablet Take 1 tablet (25 mg total) by mouth daily. 02/21/12   Erick Colace, MD  lisinopril (PRINIVIL,ZESTRIL) 10 MG tablet Take 1 tablet (10 mg total) by mouth at bedtime. 02/21/12   Erick Colace, MD  lisinopril (PRINIVIL,ZESTRIL) 20 MG tablet Take 1 tablet (20 mg total) by mouth daily. 02/21/12   Erick Colace, MD  potassium chloride (K-DUR,KLOR-CON) 10 MEQ tablet Take 1 tablet (10 mEq total) by mouth daily. 02/21/12   Erick Colace, MD   BP 115/69  Pulse 53  Temp(Src) 97.4 F (36.3 C) (Oral)  Resp 14  Ht  (1.753 m)  Wt 170 lb (77.111 kg)  BMI 25.09 kg/m2  SpO2 97% Physical Exam  Nursing note and vitals reviewed. Constitutional: He is oriented to person, place, and time. He appears well-developed and well-nourished. No distress.  HENT:  Head: Normocephalic and atraumatic.  Mouth/Throat: Oropharynx is clear and moist.  Eyes: Conjunctivae are normal. Pupils are equal, round, and reactive to light. No scleral icterus.  Neck: Neck supple.  Cardiovascular: Normal rate, regular rhythm, normal heart sounds and intact distal pulses.   No murmur heard. Pulmonary/Chest: Effort normal and breath sounds normal. No stridor. No respiratory distress. He has no wheezes. He has no rales.  Abdominal: Soft. He exhibits no distension. There is generalized tenderness. There is guarding. There is no rebound.  Musculoskeletal: Normal range of  motion. He exhibits no edema.  Neurological: He is alert and oriented to person, place, and time.  Skin: Skin is warm and dry. No rash noted.  Psychiatric: He has a normal mood and affect. His behavior is normal.    ED Course  Procedures (including critical care time) Labs Review Labs Reviewed  CBC WITH DIFFERENTIAL - Abnormal; Notable for the following:    Neutrophils Relative % 83 (*)    Lymphocytes Relative 10 (*)    All other  components within normal limits  COMPREHENSIVE METABOLIC PANEL - Abnormal; Notable for the following:    Glucose, Bld 117 (*)    ALT 54 (*)    Alkaline Phosphatase 36 (*)    GFR calc non Af Amer 79 (*)    All other components within normal limits    Imaging Review No results found.   EKG Interpretation None      MDM   Final diagnoses:  Abdominal bloating    General abdominal pain and bloating post colonoscopy.  Plan CT to rule out perf.  Pt in general well appearing.    CT pending.  Care transferred to Dr. Gwendolyn Grant.    Candyce Churn III, MD 11/05/13 [phone]

## 2013-11-05 NOTE — ED Notes (Signed)
Per Gastroenterologist, Patient was recovering from a colonoscopy and continued to have abdominal pain. MD performing the colonoscopy would like to rule out perforation.

## 2013-11-05 NOTE — Discharge Instructions (Signed)
Bloating Bloating is the feeling of fullness in your belly. You may feel as though your pants are too tight. Often the cause of bloating is overeating, retaining fluids, or having gas in your bowel. It is also caused by swallowing air and eating foods that cause gas. Irritable bowel syndrome is one of the most common causes of bloating. Constipation is also a common cause. Sometimes more serious problems can cause bloating. SYMPTOMS  Usually there is a feeling of fullness, as though your abdomen is bulged out. There may be mild discomfort.  DIAGNOSIS  Usually no particular testing is necessary for most bloating. If the condition persists and seems to become worse, your caregiver may do additional testing.  TREATMENT   There is no direct treatment for bloating.  Do not put gas into the bowel. Avoid chewing gum and sucking on candy. These tend to make you swallow air. Swallowing air can also be a nervous habit. Try to avoid this.  Avoiding high residue diets will help. Eat foods with soluble fibers (examples include root vegetables, apples, or barley) and substitute dairy products with soy and rice products. This helps irritable bowel syndrome.  If constipation is the cause, then a high residue diet with more fiber will help.  Avoid carbonated beverages.  Over-the-counter preparations are available that help reduce gas. Your pharmacist can help you with this. SEEK MEDICAL CARE IF:   Bloating continues and seems to be getting worse.  You notice a weight gain.  You have a weight loss but the bloating is getting worse.  You have changes in your bowel habits or develop nausea or vomiting. SEEK IMMEDIATE MEDICAL CARE IF:   You develop shortness of breath or swelling in your legs.  You have an increase in abdominal pain or develop chest pain. Document Released: 12/28/2005 Document Revised: 05/22/2011 Document Reviewed: 02/15/2007 ExitCare Patient Information 2015 [employer]. This  information is not intended to replace advice given to you by your health care provider. Make sure you discuss any questions you have with your health care provider.  

## 2013-11-05 NOTE — Progress Notes (Signed)
CT scan of the abdomen-pelvis showed liver cysts, kidney cysts, bilateral kidney stones, and bilateral inguinal hernias but no signs of colonic injury-perforation post colonoscopy.

## 2013-11-05 NOTE — ED Notes (Addendum)
Dr Johnson at the bedside.

## 2014-02-24 ENCOUNTER — Ambulatory Visit (HOSPITAL_BASED_OUTPATIENT_CLINIC_OR_DEPARTMENT_OTHER): Payer: BC Managed Care – PPO | Admitting: Physical Medicine & Rehabilitation

## 2014-02-24 ENCOUNTER — Encounter: Payer: BC Managed Care – PPO | Attending: Physical Medicine & Rehabilitation

## 2014-02-24 ENCOUNTER — Encounter: Payer: Self-pay | Admitting: Physical Medicine & Rehabilitation

## 2014-02-24 VITALS — BP 122/70 | HR 69 | Resp 14 | Wt 179.8 lb

## 2014-02-24 DIAGNOSIS — R209 Unspecified disturbances of skin sensation: Secondary | ICD-10-CM

## 2014-02-24 DIAGNOSIS — I69898 Other sequelae of other cerebrovascular disease: Secondary | ICD-10-CM

## 2014-02-24 DIAGNOSIS — I610 Nontraumatic intracerebral hemorrhage in hemisphere, subcortical: Secondary | ICD-10-CM

## 2014-02-24 DIAGNOSIS — R208 Other disturbances of skin sensation: Secondary | ICD-10-CM | POA: Insufficient documentation

## 2014-02-24 DIAGNOSIS — I69351 Hemiplegia and hemiparesis following cerebral infarction affecting right dominant side: Secondary | ICD-10-CM | POA: Diagnosis not present

## 2014-02-24 DIAGNOSIS — G8190 Hemiplegia, unspecified affecting unspecified side: Secondary | ICD-10-CM

## 2014-02-24 DIAGNOSIS — I69393 Ataxia following cerebral infarction: Secondary | ICD-10-CM | POA: Diagnosis not present

## 2014-02-24 DIAGNOSIS — G8191 Hemiplegia, unspecified affecting right dominant side: Secondary | ICD-10-CM

## 2014-02-24 DIAGNOSIS — I69993 Ataxia following unspecified cerebrovascular disease: Secondary | ICD-10-CM

## 2014-02-24 DIAGNOSIS — R270 Ataxia, unspecified: Secondary | ICD-10-CM

## 2014-02-24 DIAGNOSIS — I69998 Other sequelae following unspecified cerebrovascular disease: Secondary | ICD-10-CM

## 2014-02-24 NOTE — Patient Instructions (Signed)
[facility]

## 2014-02-24 NOTE — Progress Notes (Signed)
Subjective:    Patient ID: Anthony Hardin, male    DOB: 11/19/1950, 63 y.o.   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 General activity 0 Relation with others 0 Enjoyment of life 0 What TIME of day is your pain at its worst? n/a Sleep (in general) Good  Pain is worse with: n/a Pain improves with: n/a Relief from Meds: n/a  Mobility walk without assistance how many minutes can you walk? 20-30 ability to climb steps?  yes do you drive?  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  no  Physicians involved in your care Any changes since last visit?  no   Family History  Problem Relation Age of Onset  . Hypertension Mother   . Hypertension Father   . Stroke Paternal Aunt   . Anuerysm Paternal Grandfather    History   Social History  . Marital Status: Single    Spouse Name: N/A    Number of Children: 0  . Years of Education: college   Occupational History  . [employer]    Social History Main Topics  . Smoking status: Never Smoker   . Smokeless tobacco: Never Used  . Alcohol Use: 1.2 oz/week    2 Cans of beer per week  . Drug Use: No  . Sexual Activity: Not Currently   Other Topics Concern  . None   Social History Narrative   Pt lives at home alone. He does not have any children and has a PHD education level. He quit smoking 1/2 pack of cigarettes in  1980 and quit marijuana use in 1976. He has 1-2 alcohol drinks a week and 2 cups of coffee daily.    History reviewed. No pertinent past surgical history. Past Medical History  Diagnosis Date  . Hypertension   . Stroke    BP 122/70 mmHg  Pulse 69  Resp 14  Wt 179 lb 12.8 oz (81.557 kg)  SpO2 97%  Opioid Risk Score:   Fall Risk Score: Moderate Fall Risk (6-13 points) (previously educated and given handout)  Review of Systems  Neurological: Positive for numbness.  All other systems reviewed and are negative.      Objective:   Physical Exam  5/5 strength on the right side 4/5 strength in the left upper and left lower extremity  Tone is increased in the left upper and left lower tremor the Evidence of cocontraction in the arm as well as thigh and leg muscles during active movement  Sensation is present light touch, proprioception and pinprick sensation in the left hand and absent/reduced left foot Gait shows cocontraction of the hamstring and quadriceps during ambulation stifflegged gait. No evidence of toe drag Moderate elbow flexion during gait       Assessment & Plan:  1. 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 I expect that he can still build strength and endurance but do not feel he will regain much in terms of sensation.  Return to clinic 6 months  Discussed [employer] physical therapy clinic for students [facility]  Over half of the 25 min visit was spent counseling and coordinating care.

## 2014-08-25 ENCOUNTER — Ambulatory Visit: Payer: BC Managed Care – PPO | Admitting: Physical Medicine & Rehabilitation

## 2014-09-08 ENCOUNTER — Encounter: Payer: BC Managed Care – PPO | Attending: Physical Medicine & Rehabilitation

## 2014-09-08 ENCOUNTER — Ambulatory Visit (HOSPITAL_BASED_OUTPATIENT_CLINIC_OR_DEPARTMENT_OTHER): Payer: BC Managed Care – PPO | Admitting: Physical Medicine & Rehabilitation

## 2014-09-08 ENCOUNTER — Encounter: Payer: Self-pay | Admitting: Physical Medicine & Rehabilitation

## 2014-09-08 VITALS — BP 123/70 | HR 67 | Resp 14

## 2014-09-08 DIAGNOSIS — R208 Other disturbances of skin sensation: Secondary | ICD-10-CM | POA: Diagnosis not present

## 2014-09-08 DIAGNOSIS — I610 Nontraumatic intracerebral hemorrhage in hemisphere, subcortical: Secondary | ICD-10-CM | POA: Insufficient documentation

## 2014-09-08 DIAGNOSIS — R209 Unspecified disturbances of skin sensation: Secondary | ICD-10-CM

## 2014-09-08 DIAGNOSIS — R269 Unspecified abnormalities of gait and mobility: Secondary | ICD-10-CM

## 2014-09-08 DIAGNOSIS — I69398 Other sequelae of cerebral infarction: Secondary | ICD-10-CM

## 2014-09-08 DIAGNOSIS — I69898 Other sequelae of other cerebrovascular disease: Secondary | ICD-10-CM | POA: Insufficient documentation

## 2014-09-08 DIAGNOSIS — R270 Ataxia, unspecified: Secondary | ICD-10-CM | POA: Insufficient documentation

## 2014-09-08 DIAGNOSIS — I69998 Other sequelae following unspecified cerebrovascular disease: Secondary | ICD-10-CM

## 2014-09-08 DIAGNOSIS — I69993 Ataxia following unspecified cerebrovascular disease: Secondary | ICD-10-CM

## 2014-09-08 NOTE — Progress Notes (Signed)
Subjective:    Patient ID: Anthony Hardin, male    DOB: 01-Jan-1951, 64 y.o.   MRN: [id number]  HPI   Pain Inventory Average Pain 0 Pain Right Now 0 My pain is no pain  In the last 24 hours, has pain interfered with the following? General activity 0 Relation with others 0 Enjoyment of life 0 What TIME of day is your pain at its worst? no pain Sleep (in general) Good  Pain is worse with: no pain Pain improves with: no pain Relief from Meds: no pain  Mobility walk without assistance how many minutes can you walk? 60 ability to climb steps?  yes do you drive?  yes Do you have any goals in this area?  yes  Function retired  Neuro/Psych No problems in this area  Prior Studies Any changes since last visit?  no  Physicians involved in your care Any changes since last visit?  no   Family History  Problem Relation Age of Onset  . Hypertension Mother   . Hypertension Father   . Stroke Paternal Aunt   . Anuerysm Paternal Grandfather    History   Social History  . Marital Status: Single    Spouse Name: N/A  . Number of Children: 0  . Years of Education: college   Occupational History  . [employer]    Social History Main Topics  . Smoking status: Never Smoker   . Smokeless tobacco: Never Used  . Alcohol Use: 1.2 oz/week    2 Cans of beer per week  . Drug Use: No  . Sexual Activity: Not Currently   Other Topics Concern  . None   Social History Narrative   Pt lives at home alone. He does not have any children and has a PHD education level. He quit smoking 1/2 pack of cigarettes in 1980 and quit marijuana use in 1976. He has 1-2 alcohol drinks a week and 2 cups of coffee daily.    History reviewed. No pertinent past surgical history. Past Medical History  Diagnosis Date  . Hypertension   . Stroke    BP 123/70 mmHg  Pulse 67  Resp 14  SpO2 96%  Opioid Risk Score:   Fall Risk Score:  `1  Depression screen PHQ 2/9  No flowsheet data  found.   Review of Systems  All other systems reviewed and are negative.      Objective:   Physical Exam        Assessment & Plan:     Subjective:    Patient ID: Anthony Hardin, male    DOB: March 25, 1950, 64 y.o.   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 General activity 0 Relation with others 0 Enjoyment of life 0 What TIME of day is your pain at its worst? n/a Sleep (in general) Good  Pain is worse with: n/a Pain improves with: n/a Relief from Meds: n/a  Mobility walk without assistance how many minutes can you walk? 20-30 ability to climb steps?  yes do you drive?  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  no  Physicians involved in your care Any changes since last visit?  no   Family History  Problem Relation Age of Onset  . Hypertension Mother   . Hypertension Father   . Stroke Paternal Aunt   . Anuerysm Paternal Grandfather    History   Social History  . Marital Status: Single    Spouse Name: N/A  . Number of Children: 0  . Years of Education: college   Occupational History  . [employer]    Social History Main Topics  . Smoking status: Never Smoker   . Smokeless tobacco: Never Used  . Alcohol Use: 1.2 oz/week    2 Cans of beer per week  . Drug Use: No  . Sexual Activity: Not Currently   Other Topics Concern  . None   Social History Narrative   Pt lives at home alone. He does not have any children and has a PHD education level. He quit smoking 1/2 pack of cigarettes in 1980 and  quit marijuana use in 1976. He has 1-2 alcohol drinks a week and 2 cups of coffee daily.    History reviewed. No pertinent past surgical history. Past Medical History  Diagnosis Date  . Hypertension   . Stroke    BP 123/70 mmHg  Pulse 67  Resp 14  SpO2 96%  Opioid Risk Score:   Fall Risk Score:    Review of Systems  Neurological: Positive for numbness.  All other systems reviewed and are negative.      Objective:   Physical Exam  5/5 strength on the right side 4/5 strength in the left upper and left lower extremity  Tone is increased in the left upper and left lower tremor the Evidence of cocontraction in the arm as well as thigh and leg muscles during active movement  Sensation is present light touch, proprioception and pinprick sensation in the left hand  Gait shows cocontraction of the hamstring and quadriceps during ambulation stifflegged gait. No evidence of toe drag Moderate elbow flexion during gait       Assessment & Plan:  1. 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 I expect that he can still build strength and endurance but do not feel he will regain much in terms of sensation.  Return to clinic 6 months  Discussed [employer] [facility]. Has not followed through on this yet plans to [facility] post stroke gait  Over half of the 25 min visit was spent counseling and coordinating care.We discussed exercise program, genetics of his family predilection to stroke. We discussed recovery process as well as potential targets for therapeutic exercise.

## 2015-09-07 ENCOUNTER — Encounter: Payer: Self-pay | Admitting: Physical Medicine & Rehabilitation

## 2015-09-07 ENCOUNTER — Encounter: Payer: BC Managed Care – PPO | Attending: Physical Medicine & Rehabilitation

## 2015-09-07 ENCOUNTER — Ambulatory Visit (HOSPITAL_BASED_OUTPATIENT_CLINIC_OR_DEPARTMENT_OTHER): Payer: BC Managed Care – PPO | Admitting: Physical Medicine & Rehabilitation

## 2015-09-07 VITALS — BP 124/81 | HR 62 | Resp 16

## 2015-09-07 DIAGNOSIS — F1010 Alcohol abuse, uncomplicated: Secondary | ICD-10-CM | POA: Insufficient documentation

## 2015-09-07 DIAGNOSIS — G8190 Hemiplegia, unspecified affecting unspecified side: Secondary | ICD-10-CM | POA: Insufficient documentation

## 2015-09-07 DIAGNOSIS — R208 Other disturbances of skin sensation: Secondary | ICD-10-CM | POA: Insufficient documentation

## 2015-09-07 DIAGNOSIS — I69398 Other sequelae of cerebral infarction: Secondary | ICD-10-CM | POA: Diagnosis present

## 2015-09-07 DIAGNOSIS — I69993 Ataxia following unspecified cerebrovascular disease: Secondary | ICD-10-CM | POA: Diagnosis not present

## 2015-09-07 DIAGNOSIS — Z8673 Personal history of transient ischemic attack (TIA), and cerebral infarction without residual deficits: Secondary | ICD-10-CM | POA: Insufficient documentation

## 2015-09-07 DIAGNOSIS — Z8249 Family history of ischemic heart disease and other diseases of the circulatory system: Secondary | ICD-10-CM | POA: Diagnosis not present

## 2015-09-07 DIAGNOSIS — I69898 Other sequelae of other cerebrovascular disease: Secondary | ICD-10-CM | POA: Insufficient documentation

## 2015-09-07 DIAGNOSIS — Z87891 Personal history of nicotine dependence: Secondary | ICD-10-CM | POA: Diagnosis not present

## 2015-09-07 DIAGNOSIS — R269 Unspecified abnormalities of gait and mobility: Secondary | ICD-10-CM

## 2015-09-07 DIAGNOSIS — I69998 Other sequelae following unspecified cerebrovascular disease: Secondary | ICD-10-CM

## 2015-09-07 DIAGNOSIS — I10 Essential (primary) hypertension: Secondary | ICD-10-CM | POA: Diagnosis not present

## 2015-09-07 DIAGNOSIS — F1221 Cannabis dependence, in remission: Secondary | ICD-10-CM | POA: Insufficient documentation

## 2015-09-07 DIAGNOSIS — Z823 Family history of stroke: Secondary | ICD-10-CM | POA: Diagnosis not present

## 2015-09-07 DIAGNOSIS — R209 Unspecified disturbances of skin sensation: Secondary | ICD-10-CM

## 2015-09-07 NOTE — Patient Instructions (Signed)
Work on Left shoulder range of motion

## 2015-09-07 NOTE — Progress Notes (Signed)
Subjective:    Patient ID: Anthony Hardin, male    DOB: 26-Sep-1950, 65 y.o.   MRN: [id number] August 19,2013, right thalamic hemorrhage  HPI Patient returns for 1 year follow-up. Functionally doing well. He is exercising on a regular basis both for fitness as well as maintaining a home access program for left upper and left lower limb. Patient still consults part time. Working on papers. Independent with all self-care and mobility Pain Inventory Average Pain 0 Pain Right Now 0 My pain is NA  In the last 24 hours, has pain interfered with the following? General activity 0 Relation with others 0 Enjoyment of life 0 What TIME of day is your pain at its worst? NA Sleep (in general) NA  Pain is worse with: NA Pain improves with: NA Relief from Meds: NA  Mobility how many minutes can you walk? 60 min ability to climb steps?  yes do you drive?  yes Do you have any goals in this area?  yes  Function retired Do you have any goals in this area?  no  Neuro/Psych No problems in this area  Prior Studies Any changes since last visit?  no  Physicians involved in your care Primary care .   Family History  Problem Relation Age of Onset  . Hypertension Mother   . Hypertension Father   . Stroke Paternal Aunt   . Anuerysm Paternal Grandfather    Social History   Social History  . Marital Status: Single    Spouse Name: N/A  . Number of Children: 0  . Years of Education: college   Occupational History  . [employer]    Social History Main Topics  . Smoking status: Never Smoker   . Smokeless tobacco: Never Used  . Alcohol Use: 1.2 oz/week    2 Cans of beer per week  . Drug Use: No  . Sexual Activity: Not Currently   Other Topics Concern  . None   Social History Narrative   Pt lives at home alone. He does not have any children and has a PHD education level. He quit smoking 1/2 pack of cigarettes in 1980 and quit marijuana use in 1976. He has 1-2 alcohol drinks a week  and 2 cups of coffee daily.    History reviewed. No pertinent past surgical history. Past Medical History  Diagnosis Date  . Hypertension   . Stroke (HCC)    BP 124/81 mmHg  Pulse 62  Resp 16  SpO2 94%  Opioid Risk Score:   Fall Risk Score:  `1  Depression screen PHQ 2/9  No flowsheet data found.   Review of Systems  All other systems reviewed and are negative.      Objective:   Physical Exam  Constitutional: He is oriented to person, place, and time. He appears well-developed and well-nourished.  HENT:  Head: Normocephalic and atraumatic.  Eyes: Conjunctivae are normal. Pupils are equal, round, and reactive to light.  Neurological: He is alert and oriented to person, place, and time.  Psychiatric: He has a normal mood and affect.  Nursing note and vitals reviewed.   Intact light touch Intact pinprick sensation Reduced proprioception left upper extremity Motor strength is 4/5 in the left upper extremity 5/5 in the right upper tremor he 4/5 and left lower extremity 5/5 right lower extremity Left shoulder decreased internal/external rotation    Assessment & Plan:  1. 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  He has regained some light touch and pinprick sensation in the left upper extremity but proprioception remains poor Left shoulder range of motion is reduced, Stretching exercises using theraband were discussed  Patient has been very consistent with home Exercise program. Return to clinic one year

## 2016-09-07 ENCOUNTER — Ambulatory Visit: Payer: BC Managed Care – PPO | Admitting: Physical Medicine & Rehabilitation

## 2016-10-30 ENCOUNTER — Encounter: Payer: BC Managed Care – PPO | Attending: Physical Medicine & Rehabilitation

## 2016-10-30 ENCOUNTER — Ambulatory Visit (HOSPITAL_BASED_OUTPATIENT_CLINIC_OR_DEPARTMENT_OTHER): Payer: BC Managed Care – PPO | Admitting: Physical Medicine & Rehabilitation

## 2016-10-30 ENCOUNTER — Encounter: Payer: Self-pay | Admitting: Physical Medicine & Rehabilitation

## 2016-10-30 VITALS — BP 120/80 | HR 67 | Resp 14

## 2016-10-30 DIAGNOSIS — G8194 Hemiplegia, unspecified affecting left nondominant side: Secondary | ICD-10-CM | POA: Insufficient documentation

## 2016-10-30 DIAGNOSIS — M7502 Adhesive capsulitis of left shoulder: Secondary | ICD-10-CM | POA: Diagnosis not present

## 2016-10-30 DIAGNOSIS — M75102 Unspecified rotator cuff tear or rupture of left shoulder, not specified as traumatic: Secondary | ICD-10-CM | POA: Diagnosis not present

## 2016-10-30 DIAGNOSIS — I69398 Other sequelae of cerebral infarction: Secondary | ICD-10-CM

## 2016-10-30 DIAGNOSIS — I10 Essential (primary) hypertension: Secondary | ICD-10-CM | POA: Insufficient documentation

## 2016-10-30 DIAGNOSIS — R269 Unspecified abnormalities of gait and mobility: Secondary | ICD-10-CM

## 2016-10-30 DIAGNOSIS — Z8673 Personal history of transient ischemic attack (TIA), and cerebral infarction without residual deficits: Secondary | ICD-10-CM | POA: Insufficient documentation

## 2016-10-30 DIAGNOSIS — I69993 Ataxia following unspecified cerebrovascular disease: Secondary | ICD-10-CM

## 2016-10-30 NOTE — Progress Notes (Signed)
Subjective:    Patient ID: Anthony Hardin, male    DOB: 1950/08/28, 66 y.o.   MRN: [id number]  HPI   66 year old male with history of right thalamic hemorrhagic stroke 5 years ago to the day. He has chronic left hemiparesis, independent with all self-care and mobility. Drives. Writing a book  Gives further family history of strokes Some tingling on left side , no pain Trying to normalize gait Trouble going down steps Pain Inventory Average Pain 0 Pain Right Now 0 My pain is no pain  In the last 24 hours, has pain interfered with the following? General activity 0 Relation with others 0 Enjoyment of life 0 What TIME of day is your pain at its worst? no pain Sleep (in general) Good  Pain is worse with: no pain Pain improves with: no pain Relief from Meds: no pain  Mobility walk without assistance ability to climb steps?  yes do you drive?  yes Do you have any goals in this area?  yes  Function retired  Neuro/Psych trouble walking  Prior Studies Any changes since last visit?  no  Physicians involved in your care Any changes since last visit?  no   Family History  Problem Relation Age of Onset  . Hypertension Mother   . Hypertension Father   . Anuerysm Paternal Grandfather   . Stroke Paternal Aunt    Social History   Social History  . Marital status: Single    Spouse name: N/A  . Number of children: 0  . Years of education: college   Occupational History  . [employer]    Social History Main Topics  . Smoking status: Never Smoker  . Smokeless tobacco: Never Used  . Alcohol use 1.2 oz/week    2 Cans of beer per week  . Drug use: No  . Sexual activity: Not Currently   Other Topics Concern  . None   Social History Narrative   Pt lives at home alone. He does not have any children and has a PHD education level. He quit smoking 1/2 pack of cigarettes in 1980 and quit marijuana use in 1976. He has 1-2 alcohol drinks a week and 2 cups of coffee daily.     History reviewed. No pertinent surgical history. Past Medical History:  Diagnosis Date  . Hypertension   . Stroke (HCC)    BP 120/80 (BP Location: Right Arm, Patient Position: Sitting, Cuff Size: Normal)   Pulse 67   Resp 14   SpO2 96%   Opioid Risk Score:   Fall Risk Score:  `1  Depression screen PHQ 2/9  No flowsheet data found.  Review of Systems  Constitutional: Positive for unexpected weight change.  HENT: Negative.   Eyes: Negative.   Respiratory: Negative.   Cardiovascular: Negative.   Gastrointestinal: Negative.   Endocrine: Negative.   Genitourinary: Negative.   Musculoskeletal: Positive for gait problem.  Skin: Negative.   Allergic/Immunologic: Negative.   Hematological: Negative.   Psychiatric/Behavioral: Negative.        Objective:   Physical Exam  Constitutional: He is oriented to person, place, and time. He appears well-developed and well-nourished.  HENT:  Head: Normocephalic and atraumatic.  Eyes: Pupils are equal, round, and reactive to light. Conjunctivae and EOM are normal.  Neck: Normal range of motion. Neck supple.  Neurological: He is alert and oriented to person, place, and time.  Psychiatric: He has a normal mood and affect.  Nursing note and vitals reviewed.  Left great  toe sensation intact to pp Partial sensation to middle toes Absent little toe  Absent ankle  Partial elbow forearm Lefthand intact pp  Reduced Left shoulder ROM  Stiff legged gait     Assessment & Plan:  1.  Right thalamic stroke ICH- Left hemiparesis Patient has had overall and excellent recovery but has residual left hemisensory left motor control chronic issues. Some minor improvements in sensation, even at this stage.  2. Left shoulder adhesive capsulitis, pre-existing issue of rotator cuff syndrome, we discussed manipulation under general anesthesia. This would require orthopedic referral. Patient does not wish to pursue this at the current time.  Over  half of the 25 min visit was spent counseling and coordinating care. We discussed his recovery, exercise program, location of stroke, how this differs from subarachnoid hemorrhage

## 2017-10-29 ENCOUNTER — Ambulatory Visit: Payer: BC Managed Care – PPO | Admitting: Physical Medicine & Rehabilitation

## 2017-10-29 ENCOUNTER — Encounter: Payer: Medicare Other | Attending: Physical Medicine & Rehabilitation

## 2017-10-29 ENCOUNTER — Ambulatory Visit: Payer: Medicare Other | Admitting: Physical Medicine & Rehabilitation

## 2017-10-29 ENCOUNTER — Encounter: Payer: Self-pay | Admitting: Physical Medicine & Rehabilitation

## 2017-10-29 VITALS — BP 139/87 | HR 62 | Resp 14 | Ht 69.0 in | Wt 182.0 lb

## 2017-10-29 DIAGNOSIS — I10 Essential (primary) hypertension: Secondary | ICD-10-CM | POA: Insufficient documentation

## 2017-10-29 DIAGNOSIS — R269 Unspecified abnormalities of gait and mobility: Secondary | ICD-10-CM | POA: Diagnosis not present

## 2017-10-29 DIAGNOSIS — I69351 Hemiplegia and hemiparesis following cerebral infarction affecting right dominant side: Secondary | ICD-10-CM | POA: Insufficient documentation

## 2017-10-29 DIAGNOSIS — I69398 Other sequelae of cerebral infarction: Secondary | ICD-10-CM

## 2017-10-29 NOTE — Progress Notes (Signed)
Subjective:    Patient ID: Anthony Hardin, male    DOB: 1950/10/21, 67 y.o.   MRN: [id number] 67 year old male with history of right thalamic hemorrhagic stroke 6 years ago . He has chronic left hemiparesis, independent with all self-care and mobility. Drives.  HPI  Walks regularly , not able to run, not using stationary  [place]. Still with knee hyperextension  Cousin who is prosthetist recommended KO or Bioness  Pain Inventory Average Pain 0 Pain Right Now 0 My pain is no pain  In the last 24 hours, has pain interfered with the following? General activity 0 Relation with others 0 Enjoyment of life 0 What TIME of day is your pain at its worst? no pain Sleep (in general) Good  Pain is worse with: no pain Pain improves with: no pain Relief from Meds: no pain  Mobility walk without assistance how many minutes can you walk? 60 ability to climb steps?  yes do you drive?  yes Do you have any goals in this area?  yes  Function Do you have any goals in this area?  no  Neuro/Psych numbness tingling trouble walking  Prior Studies Any changes since last visit?  no  Physicians involved in your care Any changes since last visit?  no   Family History  Problem Relation Age of Onset  . Hypertension Mother   . Hypertension Father   . Anuerysm Paternal Grandfather   . Stroke Paternal Aunt    Social History   Socioeconomic History  . Marital status: Single    Spouse name: Not on file  . Number of children: 0  . Years of education: college  . Highest education level: Not on file  Occupational History  . Occupation: [employer]  Social Needs  . Financial resource strain: Not on file  . Food insecurity:    Worry: Not on file    Inability: Not on file  . Transportation needs:    Medical: Not on file    Non-medical: Not on file  Tobacco Use  . Smoking status: Never Smoker  . Smokeless tobacco: Never Used  Substance and Sexual Activity  . Alcohol  use: Yes    Alcohol/week: 2.0 standard drinks    Types: 2 Cans of beer per week  . Drug use: No  . Sexual activity: Not Currently  Lifestyle  . Physical activity:    Days per week: Not on file    Minutes per session: Not on file  . Stress: Not on file  Relationships  . Social connections:    Talks on phone: Not on file    Gets together: Not on file    Attends religious service: Not on file    Active member of club or organization: Not on file    Attends meetings of clubs or organizations: Not on file    Relationship status: Not on file  Other Topics Concern  . Not on file  Social History Narrative   Pt lives at home alone. He does not have any children and has a PHD education level. He quit smoking 1/2 pack of cigarettes in 1980 and quit marijuana use in 1976. He has 1-2 alcohol drinks a week and 2 cups of coffee daily.    History reviewed. No pertinent surgical history. Past Medical History:  Diagnosis Date  . Hypertension   . Stroke (HCC)    Ht 5\' 9"  (1.753 m)   Wt 182 lb (82.6 kg)   BMI 26.88  kg/m   Opioid Risk Score:   Fall Risk Score:  `1  Depression screen PHQ 2/9  No flowsheet data found.  Review of Systems  Constitutional: Negative.   HENT: Negative.   Eyes: Negative.   Respiratory: Negative.   Cardiovascular: Negative.   Gastrointestinal: Negative.   Endocrine: Negative.   Genitourinary: Negative.   Musculoskeletal: Positive for gait problem.  Skin: Negative.   Allergic/Immunologic: Negative.   Neurological: Positive for numbness.       Tingling   Hematological: Negative.   Psychiatric/Behavioral: Negative.        Objective:   Physical Exam Absent pinprick sensation left forearm and wrist area Diminished pinprick sensation left digits 1 through 5 Normal sensation right upper extremity Absent pinprick in the left foot and ankle area Proprioception intact left hand/fingers as well as left toes and foot   Gait has a stiff leg gait  essentially no knee flexion.  There is also circumduction of the left foot. Motor strength is 4- at the left deltoid bicep tricep grip finger flexors and extensors with decreased coordination and fine motor There is diminished strength left lower extremity with ankle dorsiflexion rated as 3-, knee extension 5/5        Assessment & Plan:  1.  Right thalamic hemorrhagic stroke approximately 6 years ago with residual left sharp touch sensation deficits, motor coordination, fine motor movements. He has had an excellent cognitive recovery. His ambulation is affected by tight heel cord on the left as well as decreased strength of the ankle dorsiflexors.  Also he has decreased activation of his hamstrings resulting in stiff leg gait. Would recommend trial of Bioness i300 make referral to physical  Therapy  We discussed his recovery as well as his residual deficits. Over half of the 25 min visit was spent counseling and coordinating care.

## 2017-10-29 NOTE — Patient Instructions (Signed)

## 2018-01-28 ENCOUNTER — Ambulatory Visit: Payer: Medicare Other | Admitting: Physical Medicine & Rehabilitation

## 2018-01-28 ENCOUNTER — Encounter: Payer: Medicare Other | Attending: Physical Medicine & Rehabilitation

## 2018-01-28 DIAGNOSIS — I69351 Hemiplegia and hemiparesis following cerebral infarction affecting right dominant side: Secondary | ICD-10-CM | POA: Insufficient documentation

## 2018-01-28 DIAGNOSIS — I10 Essential (primary) hypertension: Secondary | ICD-10-CM | POA: Insufficient documentation

## 2019-04-01 ENCOUNTER — Ambulatory Visit: Payer: Medicare PPO | Attending: Internal Medicine

## 2019-04-01 DIAGNOSIS — Z23 Encounter for immunization: Secondary | ICD-10-CM | POA: Insufficient documentation

## 2019-04-01 NOTE — Progress Notes (Signed)
   Covid-19 Vaccination Clinic  Name:  Anthony Hardin    MRN: [id number] DOB: May 16, 1950  04/01/2019  [name] was observed post Covid-19 immunization for 15 minutes without incidence. He was provided with Vaccine Information Sheet and instruction to access the V-Safe system.   [name] was instructed to call 911 with any severe reactions post vaccine: Marland Kitchen Difficulty breathing  . Swelling of your face and throat  . A fast heartbeat  . A bad rash all over your body  . Dizziness and weakness    Immunizations Administered    Name Date Dose VIS Date Route   Pfizer COVID-19 Vaccine 04/01/2019  2:12 PM 0.3 mL 02/21/2019 Intramuscular   Manufacturer: [employer], [employer]   Lot: [id number]   NDC: [address]-3

## 2019-04-22 ENCOUNTER — Ambulatory Visit: Payer: Medicare PPO | Attending: Internal Medicine

## 2019-04-22 ENCOUNTER — Ambulatory Visit: Payer: Medicare PPO

## 2019-04-22 DIAGNOSIS — Z23 Encounter for immunization: Secondary | ICD-10-CM

## 2019-04-22 NOTE — Progress Notes (Signed)
   Covid-19 Vaccination Clinic  Name:  Anthony Hardin    MRN: [id number] DOB: 02-19-1951  04/22/2019  Mr. Mehrer was observed post Covid-19 immunization for 15 minutes without incidence. He was provided with Vaccine Information Sheet and instruction to access the V-Safe system.   Mr. Fetch was instructed to call 911 with any severe reactions post vaccine: Marland Kitchen Difficulty breathing  . Swelling of your face and throat  . A fast heartbeat  . A bad rash all over your body  . Dizziness and weakness    Immunizations Administered    Name Date Dose VIS Date Route   Pfizer COVID-19 Vaccine 04/22/2019  8:25 AM 0.3 mL 02/21/2019 Intramuscular   Manufacturer: [employer], [employer]   Lot: [id number]   NDC: [address]-5

## 2020-01-05 DIAGNOSIS — E7800 Pure hypercholesterolemia, unspecified: Secondary | ICD-10-CM | POA: Diagnosis not present

## 2020-01-05 DIAGNOSIS — I6930 Unspecified sequelae of cerebral infarction: Secondary | ICD-10-CM | POA: Diagnosis not present

## 2020-01-05 DIAGNOSIS — G8194 Hemiplegia, unspecified affecting left nondominant side: Secondary | ICD-10-CM | POA: Diagnosis not present

## 2020-01-05 DIAGNOSIS — I10 Essential (primary) hypertension: Secondary | ICD-10-CM | POA: Diagnosis not present

## 2020-07-13 DIAGNOSIS — R7309 Other abnormal glucose: Secondary | ICD-10-CM | POA: Diagnosis not present

## 2020-07-13 DIAGNOSIS — K635 Polyp of colon: Secondary | ICD-10-CM | POA: Diagnosis not present

## 2020-07-13 DIAGNOSIS — E7800 Pure hypercholesterolemia, unspecified: Secondary | ICD-10-CM | POA: Diagnosis not present

## 2020-07-13 DIAGNOSIS — Z125 Encounter for screening for malignant neoplasm of prostate: Secondary | ICD-10-CM | POA: Diagnosis not present

## 2020-07-13 DIAGNOSIS — I10 Essential (primary) hypertension: Secondary | ICD-10-CM | POA: Diagnosis not present

## 2020-07-13 DIAGNOSIS — Z1389 Encounter for screening for other disorder: Secondary | ICD-10-CM | POA: Diagnosis not present

## 2020-07-13 DIAGNOSIS — Z0000 Encounter for general adult medical examination without abnormal findings: Secondary | ICD-10-CM | POA: Diagnosis not present

## 2020-07-13 DIAGNOSIS — I6930 Unspecified sequelae of cerebral infarction: Secondary | ICD-10-CM | POA: Diagnosis not present

## 2020-07-13 DIAGNOSIS — G8194 Hemiplegia, unspecified affecting left nondominant side: Secondary | ICD-10-CM | POA: Diagnosis not present

## 2021-01-18 DIAGNOSIS — R7303 Prediabetes: Secondary | ICD-10-CM | POA: Diagnosis not present

## 2021-01-18 DIAGNOSIS — I10 Essential (primary) hypertension: Secondary | ICD-10-CM | POA: Diagnosis not present

## 2021-01-18 DIAGNOSIS — G8194 Hemiplegia, unspecified affecting left nondominant side: Secondary | ICD-10-CM | POA: Diagnosis not present

## 2021-01-18 DIAGNOSIS — Z23 Encounter for immunization: Secondary | ICD-10-CM | POA: Diagnosis not present

## 2021-01-18 DIAGNOSIS — E782 Mixed hyperlipidemia: Secondary | ICD-10-CM | POA: Diagnosis not present

## 2021-02-16 DIAGNOSIS — D122 Benign neoplasm of ascending colon: Secondary | ICD-10-CM | POA: Diagnosis not present

## 2021-02-16 DIAGNOSIS — Z86010 Personal history of colonic polyps: Secondary | ICD-10-CM | POA: Diagnosis not present

## 2021-02-16 DIAGNOSIS — K648 Other hemorrhoids: Secondary | ICD-10-CM | POA: Diagnosis not present

## 2021-02-18 DIAGNOSIS — D122 Benign neoplasm of ascending colon: Secondary | ICD-10-CM | POA: Diagnosis not present

## 2021-04-15 DIAGNOSIS — H40003 Preglaucoma, unspecified, bilateral: Secondary | ICD-10-CM | POA: Diagnosis not present

## 2021-04-15 DIAGNOSIS — H2513 Age-related nuclear cataract, bilateral: Secondary | ICD-10-CM | POA: Diagnosis not present

## 2021-07-19 DIAGNOSIS — E782 Mixed hyperlipidemia: Secondary | ICD-10-CM | POA: Diagnosis not present

## 2021-07-19 DIAGNOSIS — G8194 Hemiplegia, unspecified affecting left nondominant side: Secondary | ICD-10-CM | POA: Diagnosis not present

## 2021-07-19 DIAGNOSIS — Z0000 Encounter for general adult medical examination without abnormal findings: Secondary | ICD-10-CM | POA: Diagnosis not present

## 2021-07-19 DIAGNOSIS — I6930 Unspecified sequelae of cerebral infarction: Secondary | ICD-10-CM | POA: Diagnosis not present

## 2021-07-19 DIAGNOSIS — Z1331 Encounter for screening for depression: Secondary | ICD-10-CM | POA: Diagnosis not present

## 2021-07-19 DIAGNOSIS — R7303 Prediabetes: Secondary | ICD-10-CM | POA: Diagnosis not present

## 2021-07-19 DIAGNOSIS — I10 Essential (primary) hypertension: Secondary | ICD-10-CM | POA: Diagnosis not present

## 2021-07-19 DIAGNOSIS — Z125 Encounter for screening for malignant neoplasm of prostate: Secondary | ICD-10-CM | POA: Diagnosis not present

## 2022-01-17 DIAGNOSIS — I619 Nontraumatic intracerebral hemorrhage, unspecified: Secondary | ICD-10-CM | POA: Diagnosis not present

## 2022-01-17 DIAGNOSIS — I6930 Unspecified sequelae of cerebral infarction: Secondary | ICD-10-CM | POA: Diagnosis not present

## 2022-01-17 DIAGNOSIS — R7303 Prediabetes: Secondary | ICD-10-CM | POA: Diagnosis not present

## 2022-01-17 DIAGNOSIS — Z23 Encounter for immunization: Secondary | ICD-10-CM | POA: Diagnosis not present

## 2022-01-17 DIAGNOSIS — G8194 Hemiplegia, unspecified affecting left nondominant side: Secondary | ICD-10-CM | POA: Diagnosis not present

## 2022-01-17 DIAGNOSIS — I10 Essential (primary) hypertension: Secondary | ICD-10-CM | POA: Diagnosis not present

## 2022-01-17 DIAGNOSIS — E7800 Pure hypercholesterolemia, unspecified: Secondary | ICD-10-CM | POA: Diagnosis not present

## 2022-07-25 DIAGNOSIS — Z0000 Encounter for general adult medical examination without abnormal findings: Secondary | ICD-10-CM | POA: Diagnosis not present

## 2022-07-25 DIAGNOSIS — I6930 Unspecified sequelae of cerebral infarction: Secondary | ICD-10-CM | POA: Diagnosis not present

## 2022-07-25 DIAGNOSIS — I10 Essential (primary) hypertension: Secondary | ICD-10-CM | POA: Diagnosis not present

## 2022-07-25 DIAGNOSIS — Z9181 History of falling: Secondary | ICD-10-CM | POA: Diagnosis not present

## 2022-07-25 DIAGNOSIS — E7800 Pure hypercholesterolemia, unspecified: Secondary | ICD-10-CM | POA: Diagnosis not present

## 2022-07-25 DIAGNOSIS — R7303 Prediabetes: Secondary | ICD-10-CM | POA: Diagnosis not present

## 2022-07-25 DIAGNOSIS — Z1331 Encounter for screening for depression: Secondary | ICD-10-CM | POA: Diagnosis not present

## 2022-07-25 DIAGNOSIS — Z125 Encounter for screening for malignant neoplasm of prostate: Secondary | ICD-10-CM | POA: Diagnosis not present

## 2022-07-25 DIAGNOSIS — I69354 Hemiplegia and hemiparesis following cerebral infarction affecting left non-dominant side: Secondary | ICD-10-CM | POA: Diagnosis not present

## 2022-07-25 DIAGNOSIS — Z23 Encounter for immunization: Secondary | ICD-10-CM | POA: Diagnosis not present

## 2023-01-25 DIAGNOSIS — L989 Disorder of the skin and subcutaneous tissue, unspecified: Secondary | ICD-10-CM | POA: Diagnosis not present

## 2023-01-25 DIAGNOSIS — E7800 Pure hypercholesterolemia, unspecified: Secondary | ICD-10-CM | POA: Diagnosis not present

## 2023-01-25 DIAGNOSIS — G8194 Hemiplegia, unspecified affecting left nondominant side: Secondary | ICD-10-CM | POA: Diagnosis not present

## 2023-01-25 DIAGNOSIS — R7303 Prediabetes: Secondary | ICD-10-CM | POA: Diagnosis not present

## 2023-01-25 DIAGNOSIS — I10 Essential (primary) hypertension: Secondary | ICD-10-CM | POA: Diagnosis not present

## 2023-01-25 DIAGNOSIS — Z23 Encounter for immunization: Secondary | ICD-10-CM | POA: Diagnosis not present

## 2023-01-25 DIAGNOSIS — I6930 Unspecified sequelae of cerebral infarction: Secondary | ICD-10-CM | POA: Diagnosis not present

## 2023-03-15 DIAGNOSIS — C4359 Malignant melanoma of other part of trunk: Secondary | ICD-10-CM | POA: Diagnosis not present

## 2023-03-15 DIAGNOSIS — D492 Neoplasm of unspecified behavior of bone, soft tissue, and skin: Secondary | ICD-10-CM | POA: Diagnosis not present

## 2023-03-15 DIAGNOSIS — L578 Other skin changes due to chronic exposure to nonionizing radiation: Secondary | ICD-10-CM | POA: Diagnosis not present

## 2023-04-03 ENCOUNTER — Other Ambulatory Visit: Payer: Self-pay | Admitting: General Surgery

## 2023-04-03 DIAGNOSIS — C4359 Malignant melanoma of other part of trunk: Secondary | ICD-10-CM | POA: Diagnosis not present

## 2023-04-04 DIAGNOSIS — D492 Neoplasm of unspecified behavior of bone, soft tissue, and skin: Secondary | ICD-10-CM | POA: Diagnosis not present

## 2023-04-04 DIAGNOSIS — Z08 Encounter for follow-up examination after completed treatment for malignant neoplasm: Secondary | ICD-10-CM | POA: Diagnosis not present

## 2023-04-04 DIAGNOSIS — L821 Other seborrheic keratosis: Secondary | ICD-10-CM | POA: Diagnosis not present

## 2023-04-04 DIAGNOSIS — L738 Other specified follicular disorders: Secondary | ICD-10-CM | POA: Diagnosis not present

## 2023-04-04 DIAGNOSIS — L820 Inflamed seborrheic keratosis: Secondary | ICD-10-CM | POA: Diagnosis not present

## 2023-04-04 DIAGNOSIS — Z85820 Personal history of malignant melanoma of skin: Secondary | ICD-10-CM | POA: Diagnosis not present

## 2023-04-12 ENCOUNTER — Encounter (HOSPITAL_BASED_OUTPATIENT_CLINIC_OR_DEPARTMENT_OTHER): Payer: Self-pay | Admitting: General Surgery

## 2023-04-16 ENCOUNTER — Encounter (HOSPITAL_BASED_OUTPATIENT_CLINIC_OR_DEPARTMENT_OTHER)
Admission: RE | Admit: 2023-04-16 | Discharge: 2023-04-16 | Disposition: A | Discharge status: Home / Self Care | Payer: Medicare PPO | Source: Ambulatory Visit | Attending: General Surgery | Admitting: General Surgery

## 2023-04-16 DIAGNOSIS — C4359 Malignant melanoma of other part of trunk: Secondary | ICD-10-CM | POA: Diagnosis not present

## 2023-04-16 DIAGNOSIS — I10 Essential (primary) hypertension: Secondary | ICD-10-CM | POA: Diagnosis not present

## 2023-04-16 DIAGNOSIS — Z01818 Encounter for other preprocedural examination: Secondary | ICD-10-CM | POA: Diagnosis present

## 2023-04-16 DIAGNOSIS — Z01810 Encounter for preprocedural cardiovascular examination: Secondary | ICD-10-CM | POA: Diagnosis not present

## 2023-04-16 LAB — BASIC METABOLIC PANEL
Anion gap: 11 (ref 5–15)
BUN: 19 mg/dL (ref 8–23)
CO2: 21 mmol/L — ABNORMAL LOW (ref 22–32)
Calcium: 9.1 mg/dL (ref 8.9–10.3)
Chloride: 103 mmol/L (ref 98–111)
Creatinine, Ser: 0.9 mg/dL (ref 0.61–1.24)
GFR, Estimated: >60 mL/min (ref >60)
Glucose, Bld: 90 mg/dL (ref 70–99)
Potassium: 4.8 mmol/L (ref 3.5–5.1)
Sodium: 135 mmol/L (ref 135–145)

## 2023-04-16 MED ORDER — CHLORHEXIDINE GLUCONATE CLOTH 2 % EX PADS: 6.0000 | MEDICATED_PAD | Freq: Once | CUTANEOUS | Status: DC

## 2023-04-16 NOTE — Progress Notes (Signed)

## 2023-04-16 NOTE — H&P (Signed)
REFERRING PHYSICIAN:  Wynona Canes, MD PROVIDER:  Matthias Hughs, MD MRN: [id number] DOB: March 10, 1951 DATE OF ENCOUNTER: 04/03/2023 Subjective    Plan Chief Complaint: New Consultation (- MALIGNANT MELANOMA, RT SIDE CHEST)   History of Present Illness: Anthony Hardin is a 73 y.o. 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  The patient reports that he has had a birthmark in this location as long as he can remember.  Around 6 to 8 months ago, he noted that it started to change and there were darker spots in the middle of it.  He brought this up to his primary care doctor who referred him to see dermatology.  Several spots were biopsied within the abnormal region and these were both malignant melanoma.  This was Breslow thickness 1.6 mm.  The peripheral margins were positive but deep margins were negative.  There was no evidence of ulceration, microsatellitosis, lymphovascular invasion, perineural invasion, or regression.   Patient has no personal or family history of cancer.  He does report significant skin exposure as a teen and in college.  He is a Arts administrator by training.   Pathology is from [facility] in [place].  Accession number is [id number].     Review of Systems: A complete review of systems was obtained from the patient.  I have reviewed this information and discussed as appropriate with the patient.  See HPI as well for other ROS.   Review of Systems  All other systems reviewed and are negative.     Medical History: Past Medical History      Past Medical History:  Diagnosis Date   Aneurysm (CMS-HCC)     History of stroke     Hypertension          Problem List     Patient Active Problem List  Diagnosis   Melanoma of abdominal wall (CMS/HHS-HCC)         Past Surgical History  History reviewed. No pertinent surgical history.      Allergies  Not on File     Medications Ordered Prior to Encounter        Current Outpatient Medications on File Prior to Visit  Medication Sig Dispense Refill   amLODIPine (NORVASC) 10 MG tablet         atorvastatin (LIPITOR) 40 MG tablet         hydroCHLOROthiazide (HYDRODIURIL) 25 MG tablet         lisinopriL (ZESTRIL) 10 MG tablet          No current facility-administered medications on file prior to visit.        Family History       Family History  Problem Relation Age of Onset   High blood pressure (Hypertension) Mother     Hyperlipidemia (Elevated cholesterol) Mother     Coronary Artery Disease (Blocked arteries around heart) Father     High blood pressure (Hypertension) Father     Hyperlipidemia (Elevated cholesterol) Father     High blood pressure (Hypertension) Sister     Hyperlipidemia (Elevated cholesterol) Sister     High blood pressure (Hypertension) Brother     Hyperlipidemia (Elevated cholesterol) Brother          Tobacco Use History  Social History  Tobacco Use  Smoking Status Former   Types: Cigarettes  Smokeless Tobacco Never        Social History  Social History         Socioeconomic History   Marital status: Married  Tobacco Use   Smoking status: Former      Types: Cigarettes   Smokeless tobacco: Never  Vaping Use   Vaping status: Never Used  Substance and Sexual Activity   Alcohol use: Yes   Drug use: Never    Social Drivers of Health        Housing Stability: Unknown (04/03/2023)    Housing Stability Vital Sign     Homeless in the Last Year: No        Objective:       Vitals:    04/03/23 1210  BP: 119/78  Pulse: 68  Temp: 36.7 C (98.1 F)  SpO2: 98%  Weight: 89.9 kg (198 lb 3.2 oz)  Height: 175.3 cm (5\' 9" )  PainSc: 0-No pain    Body mass index is 29.27 kg/m.     Head:   Normocephalic and atraumatic.  Mouth/Throat:  Oropharynx is clear and moist. No oropharyngeal exudate.  Eyes:   Conjunctivae are normal. Pupils are equal, round, and reactive to light. No scleral icterus.  Neck:   Normal range of motion. Neck supple. No tracheal deviation present. No thyromegaly present.  Cardiovascular: Normal rate, regular rhythm, normal heart sounds and intact distal pulses.  Exam reveals no gallop and no friction rub.   No murmur heard. Respiratory: Effort normal and breath sounds normal. No respiratory distress. No wheezes, rales or rhonchi.  No chest wall tenderness.  GI:       Soft. Bowel sounds are normal. Abdomen is soft, non tender, non distended.  No masses or hepatosplenomegaly is present. There is no rebound and no guarding.  Musculoskeletal: . Extremities are non tender and without deformity.  Lymphadenopathy:   No cervical or axillary adenopathy.  Neurological: Alert and oriented to person, place, and time. Coordination normal.  Skin:    Skin is warm and dry. No rash noted. No diaphoresis. No erythema. No pallor.  4 x 2.5 cm irregular maculopapular lesion on the right abdominal wall just below the level of the umbilicus.  There are some violaceous regions, some black and some brown.  The biopsy sites are healing well. Psychiatric: Normal mood and affect.Behavior is normal. Judgment and thought content normal.        Labs, Imaging and Diagnostic Testing: None available   Assessment and Plan:    Assessment Diagnoses and all orders for this visit:   Melanoma of abdominal wall (CMS/HHS-HCC) -     Ambulatory Referral to Cancer Genetics; Future   Patient will need a wide local excision with advancement flap closure with 2 cm margins of this abdominal wall lesion.  He will also need a sentinel lymph node biopsy.  I discussed with the patient that this may go to the groin, the axilla, or both.  We will not know until we do the injection.   I reviewed the process by which we do the surgery including the  sentinel node injection.  I discussed that he would be asleep and that the incision on the abdominal wall would be quite large and would be under tension.  I discussed risk of surgery including bleeding, infection, wound breakdown, seroma, chronic numbness or pain, decreased range of motion for a period of time.  I discussed risk of heart  or lung complications and blood clot.  These risks are low but not 0.   I reviewed that if the sentinel lymph node is positive, that we would need to get a PET scan and refer to medical oncology.   We will do this at the first available opportunity.

## 2023-04-18 ENCOUNTER — Ambulatory Visit (HOSPITAL_BASED_OUTPATIENT_CLINIC_OR_DEPARTMENT_OTHER)
Admission: RE | Admit: 2023-04-18 | Discharge: 2023-04-18 | Disposition: A | Discharge status: Home / Self Care | Payer: Medicare PPO | Attending: General Surgery | Admitting: General Surgery

## 2023-04-18 ENCOUNTER — Ambulatory Visit (HOSPITAL_COMMUNITY)
Admission: RE | Admit: 2023-04-18 | Discharge: 2023-04-18 | Disposition: A | Discharge status: Home / Self Care | Payer: Medicare PPO | Source: Ambulatory Visit | Attending: General Surgery | Admitting: General Surgery

## 2023-04-18 ENCOUNTER — Encounter (HOSPITAL_BASED_OUTPATIENT_CLINIC_OR_DEPARTMENT_OTHER): Payer: Self-pay | Admitting: General Surgery

## 2023-04-18 ENCOUNTER — Other Ambulatory Visit: Payer: Self-pay

## 2023-04-18 ENCOUNTER — Ambulatory Visit (HOSPITAL_BASED_OUTPATIENT_CLINIC_OR_DEPARTMENT_OTHER): Payer: Medicare PPO | Admitting: Certified Registered Nurse Anesthetist

## 2023-04-18 ENCOUNTER — Encounter (HOSPITAL_BASED_OUTPATIENT_CLINIC_OR_DEPARTMENT_OTHER)
Admission: RE | Disposition: A | Discharge status: Home / Self Care | Payer: Self-pay | Source: Home / Self Care | Attending: General Surgery

## 2023-04-18 DIAGNOSIS — C4359 Malignant melanoma of other part of trunk: Secondary | ICD-10-CM | POA: Diagnosis not present

## 2023-04-18 DIAGNOSIS — I10 Essential (primary) hypertension: Secondary | ICD-10-CM | POA: Insufficient documentation

## 2023-04-18 DIAGNOSIS — Z01818 Encounter for other preprocedural examination: Secondary | ICD-10-CM

## 2023-04-18 DIAGNOSIS — I679 Cerebrovascular disease, unspecified: Secondary | ICD-10-CM

## 2023-04-18 DIAGNOSIS — D0359 Melanoma in situ of other part of trunk: Secondary | ICD-10-CM | POA: Diagnosis not present

## 2023-04-18 HISTORY — PX: MELANOMA EXCISION: SHX5266

## 2023-04-18 HISTORY — PX: SENTINEL NODE BIOPSY: SHX6608

## 2023-04-18 SURGERY — EXCISION, MELANOMA
Anesthesia: General | Site: Groin | Laterality: Right

## 2023-04-18 MED ORDER — ACETAMINOPHEN 500 MG PO TABS
1000.0000 mg | ORAL_TABLET | ORAL | Status: AC
  Administered 2023-04-18: 1000 mg via ORAL

## 2023-04-18 MED ORDER — PROPOFOL 10 MG/ML IV BOLUS
INTRAVENOUS | Status: AC
  Filled 2023-04-18: qty 20

## 2023-04-18 MED ORDER — ONDANSETRON HCL 4 MG/2ML IJ SOLN
INTRAMUSCULAR | Status: DC | PRN
  Administered 2023-04-18: 4 mg via INTRAVENOUS

## 2023-04-18 MED ORDER — TECHNETIUM TC 99M TILMANOCEPT KIT
0.5000 | PACK | Freq: Once | INTRAVENOUS | Status: AC | PRN
  Administered 2023-04-18: 0.5 via INTRADERMAL

## 2023-04-18 MED ORDER — LACTATED RINGERS IV SOLN: INTRAVENOUS | Status: DC

## 2023-04-18 MED ORDER — ACETAMINOPHEN 500 MG PO TABS
ORAL_TABLET | ORAL | Status: AC
  Filled 2023-04-18: qty 2

## 2023-04-18 MED ORDER — FENTANYL CITRATE (PF) 100 MCG/2ML IJ SOLN
25.0000 ug | INTRAMUSCULAR | Status: DC | PRN
Start: 2023-04-18 — End: 2023-04-18

## 2023-04-18 MED ORDER — LIDOCAINE-EPINEPHRINE (PF) 1 %-1:200000 IJ SOLN
INTRAMUSCULAR | Status: DC | PRN
  Administered 2023-04-18: 60 mL

## 2023-04-18 MED ORDER — METHYLENE BLUE (ANTIDOTE) 1 % IV SOLN
INTRAVENOUS | Status: AC
  Filled 2023-04-18: qty 10

## 2023-04-18 MED ORDER — LIDOCAINE-EPINEPHRINE (PF) 1 %-1:200000 IJ SOLN
INTRAMUSCULAR | Status: AC
  Filled 2023-04-18: qty 30

## 2023-04-18 MED ORDER — LIDOCAINE 2% (20 MG/ML) 5 ML SYRINGE
INTRAMUSCULAR | Status: AC
  Filled 2023-04-18: qty 5

## 2023-04-18 MED ORDER — IBUPROFEN 200 MG PO TABS
ORAL_TABLET | ORAL | Status: AC
  Filled 2023-04-18: qty 4

## 2023-04-18 MED ORDER — DEXMEDETOMIDINE HCL IN NACL 80 MCG/20ML IV SOLN
INTRAVENOUS | Status: AC
  Filled 2023-04-18: qty 20

## 2023-04-18 MED ORDER — PHENYLEPHRINE HCL (PRESSORS) 10 MG/ML IV SOLN
INTRAVENOUS | Status: DC | PRN
  Administered 2023-04-18 (×3): 80 ug via INTRAVENOUS

## 2023-04-18 MED ORDER — METHYLENE BLUE (ANTIDOTE) 1 % IV SOLN
INTRAVENOUS | Status: DC | PRN
  Administered 2023-04-18: .7 mL via INTRADERMAL

## 2023-04-18 MED ORDER — EPHEDRINE SULFATE (PRESSORS) 50 MG/ML IJ SOLN
INTRAMUSCULAR | Status: DC | PRN
  Administered 2023-04-18: 10 mg via INTRAVENOUS
  Administered 2023-04-18: 5 mg via INTRAVENOUS
  Administered 2023-04-18 (×2): 10 mg via INTRAVENOUS

## 2023-04-18 MED ORDER — DEXMEDETOMIDINE HCL IN NACL 80 MCG/20ML IV SOLN
INTRAVENOUS | Status: DC | PRN
  Administered 2023-04-18: 4 ug via INTRAVENOUS

## 2023-04-18 MED ORDER — OXYCODONE HCL 5 MG/5ML PO SOLN: 5.0000 mg | Freq: Once | ORAL | Status: DC | PRN

## 2023-04-18 MED ORDER — OXYCODONE HCL 5 MG PO TABS: 5.0000 mg | ORAL_TABLET | Freq: Four times a day (QID) | ORAL | 0 refills | Status: DC | PRN

## 2023-04-18 MED ORDER — MIDAZOLAM HCL 2 MG/2ML IJ SOLN
INTRAMUSCULAR | Status: AC
  Filled 2023-04-18: qty 2

## 2023-04-18 MED ORDER — BUPIVACAINE HCL (PF) 0.25 % IJ SOLN
INTRAMUSCULAR | Status: AC
  Filled 2023-04-18: qty 30

## 2023-04-18 MED ORDER — PHENYLEPHRINE 80 MCG/ML (10ML) SYRINGE FOR IV PUSH (FOR BLOOD PRESSURE SUPPORT)
PREFILLED_SYRINGE | INTRAVENOUS | Status: AC
  Filled 2023-04-18: qty 10

## 2023-04-18 MED ORDER — FENTANYL CITRATE (PF) 100 MCG/2ML IJ SOLN
INTRAMUSCULAR | Status: DC | PRN
  Administered 2023-04-18: 50 ug via INTRAVENOUS
  Administered 2023-04-18 (×2): 25 ug via INTRAVENOUS
  Administered 2023-04-18: 50 ug via INTRAVENOUS

## 2023-04-18 MED ORDER — LIDOCAINE HCL (CARDIAC) PF 100 MG/5ML IV SOSY
PREFILLED_SYRINGE | INTRAVENOUS | Status: DC | PRN
  Administered 2023-04-18: 80 mg via INTRAVENOUS

## 2023-04-18 MED ORDER — ONDANSETRON HCL 4 MG/2ML IJ SOLN: 4.0000 mg | Freq: Four times a day (QID) | INTRAMUSCULAR | Status: DC | PRN

## 2023-04-18 MED ORDER — BUPIVACAINE HCL (PF) 0.5 % IJ SOLN
INTRAMUSCULAR | Status: AC
  Filled 2023-04-18: qty 30

## 2023-04-18 MED ORDER — FENTANYL CITRATE (PF) 100 MCG/2ML IJ SOLN
INTRAMUSCULAR | Status: AC
  Filled 2023-04-18: qty 2

## 2023-04-18 MED ORDER — OXYCODONE HCL 5 MG PO TABS: 5.0000 mg | ORAL_TABLET | Freq: Once | ORAL | Status: DC | PRN

## 2023-04-18 MED ORDER — BUPIVACAINE-EPINEPHRINE (PF) 0.25% -1:200000 IJ SOLN
INTRAMUSCULAR | Status: AC
  Filled 2023-04-18: qty 30

## 2023-04-18 MED ORDER — PROPOFOL 10 MG/ML IV BOLUS
INTRAVENOUS | Status: DC | PRN
  Administered 2023-04-18: 150 mg via INTRAVENOUS

## 2023-04-18 MED ORDER — CEFAZOLIN SODIUM-DEXTROSE 2-4 GM/100ML-% IV SOLN
INTRAVENOUS | Status: AC
  Filled 2023-04-18: qty 100

## 2023-04-18 MED ORDER — DEXAMETHASONE SODIUM PHOSPHATE 10 MG/ML IJ SOLN
INTRAMUSCULAR | Status: AC
  Filled 2023-04-18: qty 1

## 2023-04-18 MED ORDER — DEXAMETHASONE SODIUM PHOSPHATE 4 MG/ML IJ SOLN
INTRAMUSCULAR | Status: DC | PRN
  Administered 2023-04-18: 5 mg via INTRAVENOUS

## 2023-04-18 MED ORDER — CEFAZOLIN SODIUM-DEXTROSE 2-4 GM/100ML-% IV SOLN
2.0000 g | INTRAVENOUS | Status: AC
  Administered 2023-04-18: 2 g via INTRAVENOUS

## 2023-04-18 MED ORDER — IBUPROFEN 600 MG PO TABS
800.0000 mg | ORAL_TABLET | Freq: Once | ORAL | Status: AC | PRN
  Administered 2023-04-18: 800 mg via ORAL

## 2023-04-18 MED ORDER — SODIUM CHLORIDE 0.9 % IR SOLN
Status: DC | PRN
  Administered 2023-04-18: 1000 mL

## 2023-04-18 MED ORDER — BUPIVACAINE-EPINEPHRINE (PF) 0.5% -1:200000 IJ SOLN
INTRAMUSCULAR | Status: AC
  Filled 2023-04-18: qty 30

## 2023-04-18 MED ORDER — ONDANSETRON HCL 4 MG/2ML IJ SOLN
INTRAMUSCULAR | Status: AC
  Filled 2023-04-18: qty 2

## 2023-04-18 SURGICAL SUPPLY — 60 items
APPLIER CLIP 11 MED OPEN (CLIP) IMPLANT
APPLIER CLIP 9.375 MED OPEN (MISCELLANEOUS) IMPLANT
BENZOIN TINCTURE PRP APPL 2/3 (GAUZE/BANDAGES/DRESSINGS) IMPLANT
BLADE CLIPPER SURG (BLADE) IMPLANT
BLADE HEX COATED 2.75 (ELECTRODE) ×2 IMPLANT
BLADE SURG 10 STRL SS (BLADE) ×2 IMPLANT
BLADE SURG 15 STRL LF DISP TIS (BLADE) ×2 IMPLANT
BNDG COHESIVE 4X5 TAN STRL LF (GAUZE/BANDAGES/DRESSINGS) IMPLANT
BNDG GAUZE DERMACEA FLUFF 4 (GAUZE/BANDAGES/DRESSINGS) IMPLANT
CANISTER SUCT 1200ML W/VALVE (MISCELLANEOUS) ×2 IMPLANT
CHLORAPREP W/TINT 26 (MISCELLANEOUS) ×2 IMPLANT
CLIP APPLIE 11 MED OPEN (CLIP) IMPLANT
CLIP APPLIE 9.375 MED OPEN (MISCELLANEOUS) IMPLANT
CLIP TI LARGE 6 (CLIP) IMPLANT
CLIP TI MEDIUM 6 (CLIP) ×2 IMPLANT
CLIP TI WIDE RED SMALL 6 (CLIP) ×2 IMPLANT
COVER MAYO STAND STRL (DRAPES) IMPLANT
COVER PROBE CYLINDRICAL 5X96 (MISCELLANEOUS) ×2 IMPLANT
DERMABOND ADVANCED .7 DNX12 (GAUZE/BANDAGES/DRESSINGS) IMPLANT
DRAPE IMP U-DRAPE 54X76 (DRAPES) IMPLANT
DRAPE UTILITY XL STRL (DRAPES) ×4 IMPLANT
DRSG TEGADERM 4X10 (GAUZE/BANDAGES/DRESSINGS) IMPLANT
DRSG TEGADERM 4X4.75 (GAUZE/BANDAGES/DRESSINGS) IMPLANT
ELECT REM PT RETURN 9FT ADLT (ELECTROSURGICAL) ×2 IMPLANT
ELECTRODE REM PT RTRN 9FT ADLT (ELECTROSURGICAL) ×2 IMPLANT
GAUZE PAD ABD 8X10 STRL (GAUZE/BANDAGES/DRESSINGS) IMPLANT
GAUZE SPONGE 4X4 12PLY STRL (GAUZE/BANDAGES/DRESSINGS) IMPLANT
GAUZE SPONGE 4X4 12PLY STRL LF (GAUZE/BANDAGES/DRESSINGS) IMPLANT
GLOVE BIO SURGEON STRL SZ 6 (GLOVE) ×2 IMPLANT
GLOVE BIOGEL PI IND STRL 6.5 (GLOVE) ×2 IMPLANT
GOWN STRL REUS W/ TWL LRG LVL3 (GOWN DISPOSABLE) ×2 IMPLANT
GOWN STRL REUS W/ TWL XL LVL3 (GOWN DISPOSABLE) ×2 IMPLANT
LIGHT WAVEGUIDE WIDE FLAT (MISCELLANEOUS) IMPLANT
NDL HYPO 25X1 1.5 SAFETY (NEEDLE) ×4 IMPLANT
NDL SAFETY ECLIPSE 18X1.5 (NEEDLE) ×2 IMPLANT
NEEDLE HYPO 25X1 1.5 SAFETY (NEEDLE) ×4 IMPLANT
NS IRRIG 1000ML POUR BTL (IV SOLUTION) IMPLANT
PACK BASIN DAY SURGERY FS (CUSTOM PROCEDURE TRAY) ×2 IMPLANT
PACK UNIVERSAL I (CUSTOM PROCEDURE TRAY) ×2 IMPLANT
PENCIL SMOKE EVACUATOR (MISCELLANEOUS) ×2 IMPLANT
SLEEVE SCD COMPRESS KNEE MED (STOCKING) ×2 IMPLANT
SLING ARM FOAM STRAP LRG (SOFTGOODS) IMPLANT
SPIKE FLUID TRANSFER (MISCELLANEOUS) IMPLANT
SPONGE T-LAP 18X18 ~~LOC~~+RFID (SPONGE) IMPLANT
STOCKINETTE IMPERVIOUS LG (DRAPES) IMPLANT
STRIP CLOSURE SKIN 1/2X4 (GAUZE/BANDAGES/DRESSINGS) IMPLANT
SUT ETHILON 2 0 FS 18 (SUTURE) IMPLANT
SUT ETHILON 2 0 FSLX (SUTURE) IMPLANT
SUT MON AB 4-0 PC3 18 (SUTURE) ×2 IMPLANT
SUT SILK 2 0 SH (SUTURE) IMPLANT
SUT VIC AB 2-0 SH 27XBRD (SUTURE) IMPLANT
SUT VIC AB 3-0 SH 27X BRD (SUTURE) ×2 IMPLANT
SUT VICRYL 3-0 CR8 SH (SUTURE) IMPLANT
SYR BULB EAR ULCER 3OZ GRN STR (SYRINGE) ×2 IMPLANT
SYR CONTROL 10ML LL (SYRINGE) ×2 IMPLANT
SYR TB 1ML LL NO SAFETY (SYRINGE) ×2 IMPLANT
TOWEL GREEN STERILE FF (TOWEL DISPOSABLE) ×2 IMPLANT
TUBE CONNECTING 20X1/4 (TUBING) ×2 IMPLANT
UNDERPAD 30X36 HEAVY ABSORB (UNDERPADS AND DIAPERS) ×2 IMPLANT
YANKAUER SUCT BULB TIP NO VENT (SUCTIONS) ×2 IMPLANT

## 2023-04-18 NOTE — Interval H&P Note (Signed)
 History and Physical Interval Note:  04/18/2023 9:05 AM  Anthony Hardin  has presented today for surgery, with the diagnosis of MELANOMA RIGHT ABDOMINAL WALL.  The various methods of treatment have been discussed with the patient and family. After consideration of risks, benefits and other options for treatment, the patient has consented to  Procedure(s): WLE/AFC RIGHT ABDOMINAL WALL (Right) SENTINEL LYMPH NODE BIOPSY (Right) as a surgical intervention.  The patient's history has been reviewed, patient examined, no change in status, stable for surgery.  I have reviewed the patient's chart and labs.  Questions were answered to the patient's satisfaction.     [name]

## 2023-04-18 NOTE — Anesthesia Procedure Notes (Signed)
 Procedure Name: LMA Insertion Date/Time: 04/18/2023 9:42 AM  Performed by: Buster Catheryn SAUNDERS, CRNAPre-anesthesia Checklist: Patient identified, Emergency Drugs available, Suction available and Patient being monitored Patient Re-evaluated:Patient Re-evaluated prior to induction Oxygen Delivery Method: Circle system utilized Preoxygenation: Pre-oxygenation with 100% oxygen Induction Type: IV induction Ventilation: Mask ventilation without difficulty LMA: LMA inserted LMA Size: 5.0 Number of attempts: 1 Placement Confirmation: positive ETCO2 Tube secured with: Tape Dental Injury: Teeth and Oropharynx as per pre-operative assessment

## 2023-04-18 NOTE — Op Note (Addendum)
 PRE-OPERATIVE DIAGNOSIS: [id number] right abdominal wall melanoma  POST-OPERATIVE DIAGNOSIS:  Same  PROCEDURE:  Procedure(s): Wide local excision 2 cm margins, advancement flap closure for defect 9 cm x 15 cm, right axillary and right inguinal sentinel lymph node mapping and biopsy  SURGEON:  Surgeon(s): [name], MD  ASSIST:  Tonja Shaper, PA-C  ANESTHESIA:   local and general  DRAINS: none   LOCAL MEDICATIONS USED:  MARCAINE     and XYLOCAINE    SPECIMEN:  Source of Specimen:  wide local excision right abdominal wall melanoma, right axillary sentinel lymph node #1, right axillary sentinel node #2, right inguinal sentinel node #3  FINDINGS:  margins grossly negative, sentinel nodes hot, CPS #1 230, #2 190, #3 80, background right axilla 15, right groin 5  DISPOSITION OF SPECIMEN:  PATHOLOGY  COUNTS:  YES  PLAN OF CARE: Discharge to home after PACU  PATIENT DISPOSITION:  PACU - hemodynamically stable.    PROCEDURE:   Pt was identified in the holding area, taken to the OR, and placed supine on the OR table.  General anesthesia was induced.  Time out was performed according to the surgical safety checklist.  When all was correct, we continued.  One mL methylene blue  was injected intradermally around the melanoma biopsy site.    The patient was placed into the supine position.  The right axilla, abdominal wall, and groin were prepped and draped in sterile fashion.  The melanoma was identified and 2 cm margins were marked out.  This was turned into an ellipse.  Local was administered under the melanoma and the adjacent tissue.  A #10 blade was used to incise the skin around the melanoma.  The cautery was used to take the dissection down to the fascia.  The skin was marked in situ with orientation sutures.  The cautery was used to take the specimen off the fascia, and it was passed off the table.  The defect was sized as above.   Skin hooks were used to elevate the edges of the incision  and the skin was freed up in all directions to create advancement flaps at the level of the fascia.  This was pulled together in a transverse orientation. The skin was pulled together to check the tension. . Deep interrupted 2-0 vicryl sutures were placed to relieve tension.  The skin was then reapproximated with 3-0 interrupted vicryl deep dermal sutures and 4-0 monocryl running subcuticular sutures.  Five 2-0 nylon horizontal mattress sutures were placed as well.    The right axilla was then addressed. The point of maximum signal intensity was identified with the neoprobe.  A 4 cm incision was made with a #15 blade.  The subcutaneous tissues were divided with the cautery.  A Weitlaner retractor was used to assist with visualization.  The tonsil clamp was used to bluntly dissect the axillary fat pad.  Two deep right axillary sentinel lymph nodes were identified as described above.  The lymphovascular channels were clipped with hemoclips.  The nodes were passed off as specimens.  Hemostasis was achieved with the cautery.  The axilla was irrigated and closed with 3-0 Vicryl deep dermal interrupted sutures and 4-0 Monocryl running subcuticular suture.  The axilla was dressed with dermabond.    The right groin was addressed similarly to the axilla. One deep groin node was removed.    The melanoma site was cleaned, dried, and dressed with Benzoin, steristrips, gauze, and tegaderm.    Needle, sponge, and instrument counts were correct.  The patient was awakened from anesthesia and taken to the PACU in stable condition.

## 2023-04-18 NOTE — Transfer of Care (Signed)
 Immediate Anesthesia Transfer of Care Note  Patient: Anthony Hardin  Procedure(s) Performed: WLE/AFC RIGHT ABDOMINAL WALL (Right: Abdomen) SENTINEL LYMPH NODE BIOPSY (Right: Groin)  Patient Location: PACU  Anesthesia Type:General  Level of Consciousness: sedated  Airway & Oxygen Therapy: Patient Spontanous Breathing and Patient connected to face mask oxygen  Post-op Assessment: Report given to RN and Post -op Vital signs reviewed and stable  Post vital signs: Reviewed and stable  Last Vitals:  Vitals Value Taken Time  BP 121/68 04/18/23 1150  Temp    Pulse 58 04/18/23 1151  Resp 16 04/18/23 1151  SpO2 96 % 04/18/23 1151  Vitals shown include unfiled device data.  Last Pain:  Vitals:   04/18/23 0745  TempSrc: Temporal  PainSc: 0-No pain      Patients Stated Pain Goal: 3 (04/18/23 0745)  Complications: No notable events documented.

## 2023-04-18 NOTE — Discharge Instructions (Addendum)
 [facility] Office Phone Number [phone]   POST OP INSTRUCTIONS  Always review your discharge instruction sheet given to you by the facility where your surgery was performed.  IF YOU HAVE DISABILITY OR FAMILY LEAVE FORMS, YOU MUST BRING THEM TO THE OFFICE FOR PROCESSING.  DO NOT GIVE THEM TO YOUR DOCTOR.  Take 2 tylenol  (acetominophen) three times a day for 3 days.  If you still have pain, add ibuprofen  with food in between if able to take this (if you have kidney issues or stomach issues, do not take ibuprofen ).  If both of those are not enough, add the narcotic pain pill.  If you find you are needing a lot of this overnight after surgery, call the next morning for a refill.   Take your usually prescribed medications unless otherwise directed If you need a refill on your pain medication, please contact your pharmacy.  They will contact our office to request authorization.  Prescriptions will not be filled after 5pm or on week-ends. You should eat very light the first 24 hours after surgery, such as soup, crackers, pudding, etc.  Resume your normal diet the day after surgery It is common to experience some constipation if taking pain medication after surgery.  Increasing fluid intake and taking a stool softener will usually help or prevent this problem from occurring.  A mild laxative (Milk of Magnesia or Miralax ) should be taken according to package directions if there are no bowel movements after 48 hours. You may shower in 48 hours.  The surgical glue will flake off in 2-3 weeks.   ACTIVITIES:  No strenuous activity or heavy lifting for 2 weeks You may drive when you no longer are taking prescription pain medication, you can comfortably wear a seatbelt, and you can safely maneuver your car and apply brakes. RETURN TO WORK:  __________as tolerated if not lifting x 2 weeks. _______________ Anthony Hardin should see your doctor in the office for a follow-up appointment approximately  three-four weeks after your surgery.    WHEN TO CALL YOUR DOCTOR: Fever over 101.0 Nausea and/or vomiting. Extreme swelling or bruising. Continued bleeding from incision. Increased pain, redness, or drainage from the incision.  The clinic staff is available to answer your questions during regular business hours.  Please don't hesitate to call and ask to speak to one of the nurses for clinical concerns.  If you have a medical emergency, go to the nearest emergency room or call 911.  A surgeon from [facility] is always on call at the hospital.  For further questions, please visit [email or web address]    Post Anesthesia Home Care Instructions  Activity: Get plenty of rest for the remainder of the day. A responsible individual must stay with you for 24 hours following the procedure.  For the next 24 hours, DO NOT: -Drive a car -Advertising copywriter -Drink alcoholic beverages -Take any medication unless instructed by your physician -Make any legal decisions or sign important papers.  Meals: Start with liquid foods such as gelatin or soup. Progress to regular foods as tolerated. Avoid greasy, spicy, heavy foods. If nausea and/or vomiting occur, drink only clear liquids until the nausea and/or vomiting subsides. Call your physician if vomiting continues.  Special Instructions/Symptoms: Your throat may feel dry or sore from the anesthesia or the breathing tube placed in your throat during surgery. If this causes discomfort, gargle with warm salt water. The discomfort should disappear within 24 hours.  If you had a scopolamine patch placed behind  your ear for the management of post- operative nausea and/or vomiting:  1. The medication in the patch is effective for 72 hours, after which it should be removed.  Wrap patch in a tissue and discard in the trash. Wash hands thoroughly with soap and water. 2. You may remove the patch earlier than 72 hours if you experience  unpleasant side effects which may include dry mouth, dizziness or visual disturbances. 3. Avoid touching the patch. Wash your hands with soap and water after contact with the patch.    Last received tylenol  at 8am

## 2023-04-18 NOTE — Anesthesia Preprocedure Evaluation (Signed)
 Anesthesia Evaluation  Patient identified by MRN, date of birth, ID band Patient awake    Reviewed: Allergy & Precautions, H&P , NPO status , Patient's Chart, lab work & pertinent test results  Airway Mallampati: II   Neck ROM: full    Dental   Pulmonary neg pulmonary ROS   breath sounds clear to auscultation       Cardiovascular hypertension,  Rhythm:regular Rate:Normal     Neuro/Psych CVA    GI/Hepatic   Endo/Other    Renal/GU      Musculoskeletal   Abdominal   Peds  Hematology   Anesthesia Other Findings   Reproductive/Obstetrics                             Anesthesia Physical Anesthesia Plan  ASA: 3  Anesthesia Plan: General   Post-op Pain Management:    Induction: Intravenous  PONV Risk Score and Plan: 2 and Ondansetron , Dexamethasone  and Treatment may vary due to age or medical condition  Airway Management Planned: LMA  Additional Equipment:   Intra-op Plan:   Post-operative Plan: Extubation in OR  Informed Consent: I have reviewed the patients History and Physical, chart, labs and discussed the procedure including the risks, benefits and alternatives for the proposed anesthesia with the patient or authorized representative who has indicated his/her understanding and acceptance.     Dental advisory given  Plan Discussed with: CRNA, Anesthesiologist and Surgeon  Anesthesia Plan Comments:        Anesthesia Quick Evaluation

## 2023-04-19 ENCOUNTER — Encounter (HOSPITAL_BASED_OUTPATIENT_CLINIC_OR_DEPARTMENT_OTHER): Payer: Self-pay | Admitting: General Surgery

## 2023-04-19 NOTE — Anesthesia Postprocedure Evaluation (Signed)
 Anesthesia Post Note  Patient: Anthony Hardin  Procedure(s) Performed: WLE/AFC RIGHT ABDOMINAL WALL (Right: Abdomen) SENTINEL LYMPH NODE BIOPSY (Right: Groin)     Patient location during evaluation: PACU Anesthesia Type: General Level of consciousness: awake and alert Pain management: pain level controlled Vital Signs Assessment: post-procedure vital signs reviewed and stable Respiratory status: spontaneous breathing, nonlabored ventilation, respiratory function stable and patient connected to nasal cannula oxygen Cardiovascular status: blood pressure returned to baseline and stable Postop Assessment: no apparent nausea or vomiting Anesthetic complications: no   No notable events documented.  Last Vitals:  Vitals:   04/18/23 1230 04/18/23 1257  BP: 135/74 134/82  Pulse: (!) 58 64  Resp: 18 16  Temp:  (!) 36.2 C  SpO2: 94% 94%    Last Pain:  Vitals:   04/19/23 1001  TempSrc:   PainSc: 0-No pain                 Lukasz Rogus S

## 2023-04-29 LAB — SURGICAL PATHOLOGY

## 2023-05-21 ENCOUNTER — Other Ambulatory Visit (HOSPITAL_COMMUNITY): Payer: Self-pay | Admitting: General Surgery

## 2023-05-21 ENCOUNTER — Telehealth: Payer: Self-pay | Admitting: Genetic Counselor

## 2023-05-21 DIAGNOSIS — C4359 Malignant melanoma of other part of trunk: Secondary | ICD-10-CM

## 2023-05-23 ENCOUNTER — Encounter (HOSPITAL_COMMUNITY)
Admission: RE | Admit: 2023-05-23 | Discharge: 2023-05-23 | Disposition: A | Discharge status: Home / Self Care | Source: Ambulatory Visit | Attending: General Surgery | Admitting: General Surgery

## 2023-05-23 DIAGNOSIS — C4359 Malignant melanoma of other part of trunk: Secondary | ICD-10-CM | POA: Diagnosis not present

## 2023-05-23 LAB — GLUCOSE, CAPILLARY: Glucose-Capillary: 111 mg/dL — ABNORMAL HIGH (ref 70–99)

## 2023-05-23 MED ORDER — FLUDEOXYGLUCOSE F - 18 (FDG) INJECTION
9.5500 | Freq: Once | INTRAVENOUS | Status: AC
  Administered 2023-05-23: 9.55 via INTRAVENOUS

## 2023-05-26 ENCOUNTER — Encounter: Payer: Self-pay | Admitting: General Surgery

## 2023-06-11 ENCOUNTER — Telehealth: Payer: Self-pay | Admitting: Radiation Oncology

## 2023-06-11 NOTE — Telephone Encounter (Signed)
 3/31 @ 4:07 pm Left voicemail for patient to call our office to be schedule for consult.

## 2023-06-12 NOTE — Progress Notes (Incomplete)
 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 6-8 months ago, he noted that it started to change and there were darker spots in the middle of it. He visited his primary care doctor who referred him to dermatology. Several spots were biopsied within the abnormal region and these were both malignant melanoma.   04/18/2023 Biopsy    Past/Anticipated interventions by patient's surgeon/dermatologist for current problematic lesion, if any:  04/18/2023 Donell Beers, MD Wide Local Excision with Advancement of Flap Closure with 2 cm margins of this abdominal wall lesion.   Past skin cancers, if any: 1) Location/Histology/Intervention: Melanoma  History of Blistering sunburns, if any:  Patient reports significant skin exposure as a teen and in college   Assessment and Plan 05/25/2023 Donell Beers, MD  SAFETY ISSUES: Prior radiation? {:18581} Pacemaker/ICD? {:18581} Possible current pregnancy? {:18581} Is the patient on methotrexate? {:18581}  Current Complaints / other details:  ***

## 2023-06-13 DIAGNOSIS — D225 Melanocytic nevi of trunk: Secondary | ICD-10-CM | POA: Diagnosis not present

## 2023-06-13 DIAGNOSIS — Z85820 Personal history of malignant melanoma of skin: Secondary | ICD-10-CM | POA: Diagnosis not present

## 2023-06-13 DIAGNOSIS — L821 Other seborrheic keratosis: Secondary | ICD-10-CM | POA: Diagnosis not present

## 2023-06-13 DIAGNOSIS — Z08 Encounter for follow-up examination after completed treatment for malignant neoplasm: Secondary | ICD-10-CM | POA: Diagnosis not present

## 2023-06-13 DIAGNOSIS — L814 Other melanin hyperpigmentation: Secondary | ICD-10-CM | POA: Diagnosis not present

## 2023-06-13 DIAGNOSIS — D492 Neoplasm of unspecified behavior of bone, soft tissue, and skin: Secondary | ICD-10-CM | POA: Diagnosis not present

## 2023-06-14 NOTE — Progress Notes (Signed)
 Radiation Oncology         [phone] ________________________________  Initial Outpatient Consultation  Name: Anthony Hardin MRN: [id number]  Date: 06/15/2023  DOB: 24-Sep-1950  EX:BMWUXL, Anthony Scott, MD  Anthony Lint, MD   REFERRING PHYSICIAN: Almond Lint, MD  DIAGNOSIS: No diagnosis found.   Cancer Staging  No matching staging information was found for the patient.  Malignant melanoma of the right abdomen - s/p wide local excision and SLN excisions showing residual malignant melanoma, positive for LVI and PNI, along with an additional desmoplastic component (all SLN negative for melanoma)  CHIEF COMPLAINT: Here to discuss management of skin cancer/melanoma  HISTORY OF PRESENT ILLNESS::Anthony Hardin is a 73 y.o. male who presented to his PCP around late December 2024-January 2025 with visible changes to a birth mark on his right abdomen which he first noticed 6-8 months prior. His PCP accordingly referred him to dermatology who collected several biopsies from the birth mark. Pathology showed findings consistent with malignant melanoma with a breslows depth of 1.6 mm; peripheral margins positive; deep margins negative; negative for LVI or PNI. (Pathology results per provider encounter notes; biopsy pathology results are not available on EMR for review at this time).   He was subsequently referred to Dr. Donell Hardin and opted to proceed with wide local excision of the abdominal wall lesion and SLN excisions on 04/18/23. Pathology from the procedure showed: breslows depth of 4.7 mm; histology of residual malignant melanoma with superficial spreading and with an additional desmoplastic component measuring 4.0 mm; positive for LVI and PNI; all margins negative; nodal status of 2/2 right axillary SLN excisions negative for melanoma and 1/1 right groin SLN excision negative for melanoma.   He did develop a post-operative seroma to the surgical site. Dr. Donell Hardin aspirated the site on 02/28 which  yielded 330 cc's of fluid. The site was again aspirated on 03/06 and yielded 200 cc's of fluid. His seroma however persisted prompting an abdominal US to be performed during his most recent visit with Dr. Donell Hardin on 06/11/23. US findings did show a fluid collection however only thick bloody fluid could be expressed from the site. In light of this finding, Dr. Donell Hardin noted that his seroma is more likely a hematoma at this point.   Based on his final pathology coming back positive for PNI and LVI, a PET scan was performed on 05/23/23 which demonstrated: no findings of active malignancy and low grade likely post-operative activity along the margins of the right anterior abdominal wall, right inguinal, and right axillary excision sites.   ***  PREVIOUS RADIATION THERAPY: No  PAST MEDICAL HISTORY:  has a past medical history of Hypertension and Stroke (HCC).    PAST SURGICAL HISTORY: Past Surgical History:  Procedure Laterality Date   COLONOSCOPY     MELANOMA EXCISION Right 04/18/2023   Procedure: WLE/AFC RIGHT ABDOMINAL WALL;  Surgeon: Anthony Lint, MD;  Location: [facility];  Service: General;  Laterality: Right;   SENTINEL NODE BIOPSY Right 04/18/2023   Procedure: SENTINEL LYMPH NODE BIOPSY;  Surgeon: Anthony Lint, MD;  Location: [facility];  Service: General;  Laterality: Right;  Right axilla sentinel node excised in addition to groin node.    FAMILY HISTORY: family history includes Anuerysm in his paternal grandfather; Hypertension in his father and mother; Stroke in his paternal aunt.  SOCIAL HISTORY:  reports that he has never smoked. He has never used smokeless tobacco. He reports current alcohol use of about 2.0 standard drinks  of alcohol per week. He reports that he does not use drugs.  ALLERGIES: Scallops [shellfish allergy]  MEDICATIONS:  Current Outpatient Medications  Medication Sig Dispense Refill   amLODipine (NORVASC) 10 MG tablet Take 1 tablet (10  mg total) by mouth daily. 30 tablet 1   atorvastatin (LIPITOR) 10 MG tablet Take 20 mg by mouth daily.      hydrochlorothiazide (HYDRODIURIL) 25 MG tablet Take 1 tablet (25 mg total) by mouth daily. 30 tablet 1   lisinopril (PRINIVIL,ZESTRIL) 20 MG tablet Take 1 tablet (20 mg total) by mouth daily. 30 tablet 1   oxyCODONE (OXY IR/ROXICODONE) 5 MG immediate release tablet Take 1 tablet (5 mg total) by mouth every 6 (six) hours as needed for severe pain (pain score 7-10). 15 tablet 0   No current facility-administered medications for this encounter.    REVIEW OF SYSTEMS:  Notable for that above.   PHYSICAL EXAM:  vitals were not taken for this visit.   General: Alert and oriented, in no acute distress *** HEENT: Head is normocephalic. Extraocular movements are intact. Oropharynx is clear. Neck: Neck is supple, no palpable cervical or supraclavicular lymphadenopathy. Heart: Regular in rate and rhythm with no murmurs, rubs, or gallops. Chest: Clear to auscultation bilaterally, with no rhonchi, wheezes, or rales. Abdomen: Soft, nontender, nondistended, with no rigidity or guarding. Extremities: No cyanosis or edema. Lymphatics: see Neck Exam Skin: No concerning lesions. Musculoskeletal: symmetric strength and muscle tone throughout. Neurologic: Cranial nerves II through XII are grossly intact. No obvious focalities. Speech is fluent. Coordination is intact. Psychiatric: Judgment and insight are intact. Affect is appropriate.   ECOG = ***  0 - Asymptomatic (Fully active, able to carry on all predisease activities without restriction)  1 - Symptomatic but completely ambulatory (Restricted in physically strenuous activity but ambulatory and able to carry out work of a light or sedentary nature. For example, light housework, office work)  2 - Symptomatic, <50% in bed during the day (Ambulatory and capable of all self care but unable to carry out any work activities. Up and about more than 50%  of waking hours)  3 - Symptomatic, >50% in bed, but not bedbound (Capable of only limited self-care, confined to bed or chair 50% or more of waking hours)  4 - Bedbound (Completely disabled. Cannot carry on any self-care. Totally confined to bed or chair)  5 - Death   Santiago Glad MM, Creech RH, Tormey DC, et al. [phone]). "Toxicity and response criteria of the [facility] [employer]". Am. Evlyn Clines. Oncol. 5 (6): 649-55   LABORATORY DATA:  Lab Results  Component Value Date   WBC 8.4 11/05/2013   HGB 14.2 11/05/2013   HCT 40.6 11/05/2013   MCV 87.9 11/05/2013   PLT 196 11/05/2013   CMP     Component Value Date/Time   NA 135 04/16/2023 1529   K 4.8 04/16/2023 1529   CL 103 04/16/2023 1529   CO2 21 (L) 04/16/2023 1529   GLUCOSE 90 04/16/2023 1529   BUN 19 04/16/2023 1529   CREATININE 0.90 04/16/2023 1529   CALCIUM 9.1 04/16/2023 1529   PROT 6.8 11/05/2013 1246   ALBUMIN 3.8 11/05/2013 1246   AST 32 11/05/2013 1246   ALT 54 (H) 11/05/2013 1246   ALKPHOS 36 (L) 11/05/2013 1246   BILITOT 0.5 11/05/2013 1246   GFRNONAA >60 04/16/2023 1529         RADIOGRAPHY: NM PET Image Initial (PI) Whole Body (F-18 FDG) Result Date: 05/25/2023  CLINICAL DATA:  Initial treatment strategy for right abdominal wall melanoma just below the level of the umbilicus. Prior resection on 04/18/2023 including right axillary and right inguinal sentinel node mapping and biopsy. EXAM: NUCLEAR MEDICINE PET WHOLE BODY TECHNIQUE: 9.4 mCi F-18 FDG was injected intravenously. Full-ring PET imaging was performed from the head to foot after the radiotracer. CT data was obtained and used for attenuation correction and anatomic localization. Fasting blood glucose: 111 mg/dl COMPARISON:  CT abdomen 11/05/2013 FINDINGS: Mediastinal blood pool activity: SUV max 2.3 HEAD/NECK: No significant abnormal hypermetabolic activity in this region. Incidental CT findings: Right common carotid atheromatous vascular calcification.  CHEST: Right axillary stranding compatible with recent operative site, local maximum SUV 3.2, likely postinflammatory/post surgical given that this is right along the surgical clips. Incidental CT findings: Small calcified right infrahilar lymph node likely from old granulomatous disease. Scarring in the posterior basal segment right lower lobe. Mild dependent scarring or atelectasis in the posterior basal segment left lower lobe. ABDOMEN/PELVIS: Photopenic hepatic and renal cysts. 7.8 by 15.9 by 4.0 cm (volume = 260 cm^3) fluid collection along the superficial fascia margin of the right anterior abdominal wall is likely a postoperative fluid collection, this has low-grade activity along its margins which is likely inflammatory, and which is highest inferiorly with maximum SUV of 4.0. Favor inflammatory activity over residual/recurrent malignancy. No current suspicious lesion in this vicinity. Scattered physiologic activity in bowel. Low-grade activity along the lateral margin of a 2.4 by 2.3 cm right inguinal fluid collection on image 197 of series 4, compatible with lymph node dissection site, maximum SUV 3.2, likely inflammatory. Incidental CT findings: Speckle renal calcifications, mostly parenchymal although punctate renal calculi are not excluded. Atherosclerosis is present, including aortoiliac atherosclerotic disease. SKELETON: No significant abnormal hypermetabolic activity in this region. Incidental CT findings: Left greater than right degenerative glenohumeral arthropathy. EXTREMITIES: No significant abnormal hypermetabolic activity in this region. Incidental CT findings: Nonspecific subcutaneous edema in the left calf and dorsal left foot. IMPRESSION: 1. No findings of active malignancy. 2. Low-grade activity along the margins of a 260 cc fluid collection along the superficial fascia margin of the right anterior abdominal wall, likely a postoperative fluid collection. Favor inflammatory activity over  residual/recurrent malignancy. 3. Low-grade activity along the lateral margin of a 2.4 cm right inguinal fluid collection, compatible with lymph node dissection site, likely inflammatory activity rather than neoplastic. 4. Right axillary stranding compatible with recent operative site, with low-grade activity along its margins, likely postinflammatory/post surgical activity. 5.  Aortic Atherosclerosis (ICD10-I70.0). 6. 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 7. Nonspecific subcutaneous edema in the left calf and dorsal left foot. Aortic Atherosclerosis (ICD10-I70.0). Electronically Signed   By: Gaylyn Rong M.D.   On: 05/25/2023 13:56      IMPRESSION/PLAN:***    On date of service, in total, I spent *** minutes on this encounter. Patient was seen in person.   __________________________________________   Lonie Peak, MD  This document serves as a record of services personally performed by Lonie Peak, MD. It was created on her behalf by Neena Rhymes, a trained medical scribe. The creation of this record is based on the scribe's personal observations and the provider's statements to them. This document has been checked and approved by the attending provider.

## 2023-06-15 ENCOUNTER — Ambulatory Visit
Admission: RE | Admit: 2023-06-15 | Discharge: 2023-06-15 | Disposition: A | Discharge status: Home / Self Care | Source: Ambulatory Visit | Attending: Radiation Oncology | Admitting: Radiation Oncology

## 2023-06-15 VITALS — BP 124/74 | HR 71 | Temp 97.0°F | Resp 20 | Ht 69.0 in | Wt 189.2 lb

## 2023-06-15 DIAGNOSIS — Z79899 Other long term (current) drug therapy: Secondary | ICD-10-CM | POA: Diagnosis not present

## 2023-06-15 DIAGNOSIS — C4359 Malignant melanoma of other part of trunk: Secondary | ICD-10-CM | POA: Insufficient documentation

## 2023-06-15 DIAGNOSIS — N281 Cyst of kidney, acquired: Secondary | ICD-10-CM | POA: Diagnosis not present

## 2023-06-15 DIAGNOSIS — Z808 Family history of malignant neoplasm of other organs or systems: Secondary | ICD-10-CM | POA: Insufficient documentation

## 2023-06-15 DIAGNOSIS — Z803 Family history of malignant neoplasm of breast: Secondary | ICD-10-CM | POA: Diagnosis not present

## 2023-06-15 DIAGNOSIS — I700 Atherosclerosis of aorta: Secondary | ICD-10-CM | POA: Insufficient documentation

## 2023-06-15 DIAGNOSIS — J984 Other disorders of lung: Secondary | ICD-10-CM | POA: Diagnosis not present

## 2023-06-15 DIAGNOSIS — L905 Scar conditions and fibrosis of skin: Secondary | ICD-10-CM | POA: Diagnosis not present

## 2023-06-18 DIAGNOSIS — C4359 Malignant melanoma of other part of trunk: Secondary | ICD-10-CM | POA: Diagnosis not present

## 2023-06-19 ENCOUNTER — Ambulatory Visit
Admission: RE | Admit: 2023-06-19 | Discharge: 2023-06-19 | Disposition: A | Discharge status: Home / Self Care | Source: Ambulatory Visit | Attending: Radiation Oncology | Admitting: Radiation Oncology

## 2023-06-19 DIAGNOSIS — C4359 Malignant melanoma of other part of trunk: Secondary | ICD-10-CM | POA: Diagnosis not present

## 2023-06-19 DIAGNOSIS — Z510 Encounter for antineoplastic radiation therapy: Secondary | ICD-10-CM | POA: Diagnosis not present

## 2023-06-26 DIAGNOSIS — Z510 Encounter for antineoplastic radiation therapy: Secondary | ICD-10-CM | POA: Diagnosis not present

## 2023-06-26 DIAGNOSIS — C4359 Malignant melanoma of other part of trunk: Secondary | ICD-10-CM | POA: Diagnosis not present

## 2023-06-27 ENCOUNTER — Ambulatory Visit
Admission: RE | Admit: 2023-06-27 | Discharge: 2023-06-27 | Disposition: A | Discharge status: Home / Self Care | Source: Ambulatory Visit | Attending: Radiation Oncology | Admitting: Radiation Oncology

## 2023-06-27 ENCOUNTER — Other Ambulatory Visit: Payer: Self-pay

## 2023-06-27 DIAGNOSIS — Z510 Encounter for antineoplastic radiation therapy: Secondary | ICD-10-CM | POA: Diagnosis not present

## 2023-06-27 DIAGNOSIS — C4359 Malignant melanoma of other part of trunk: Secondary | ICD-10-CM | POA: Diagnosis not present

## 2023-06-27 LAB — RAD ONC ARIA SESSION SUMMARY
Course Elapsed Days: 0
Plan Fractions Treated to Date: 1
Plan Prescribed Dose Per Fraction: 6 Gy
Plan Total Fractions Prescribed: 5
Plan Total Prescribed Dose: 30 Gy
Reference Point Dosage Given to Date: 6 Gy
Reference Point Session Dosage Given: 6 Gy
Session Number: 1

## 2023-06-28 ENCOUNTER — Ambulatory Visit

## 2023-06-29 ENCOUNTER — Other Ambulatory Visit: Payer: Self-pay

## 2023-06-29 ENCOUNTER — Ambulatory Visit
Admission: RE | Admit: 2023-06-29 | Discharge: 2023-06-29 | Disposition: A | Discharge status: Home / Self Care | Source: Ambulatory Visit | Attending: Radiation Oncology | Admitting: Radiation Oncology

## 2023-06-29 DIAGNOSIS — Z510 Encounter for antineoplastic radiation therapy: Secondary | ICD-10-CM | POA: Diagnosis not present

## 2023-06-29 DIAGNOSIS — C4359 Malignant melanoma of other part of trunk: Secondary | ICD-10-CM | POA: Diagnosis not present

## 2023-06-29 LAB — RAD ONC ARIA SESSION SUMMARY
Course Elapsed Days: 2
Plan Fractions Treated to Date: 2
Plan Prescribed Dose Per Fraction: 6 Gy
Plan Total Fractions Prescribed: 5
Plan Total Prescribed Dose: 30 Gy
Reference Point Dosage Given to Date: 12 Gy
Reference Point Session Dosage Given: 6 Gy
Session Number: 2

## 2023-07-02 ENCOUNTER — Other Ambulatory Visit: Payer: Self-pay

## 2023-07-02 ENCOUNTER — Ambulatory Visit
Admission: RE | Admit: 2023-07-02 | Discharge: 2023-07-02 | Disposition: A | Discharge status: Home / Self Care | Source: Ambulatory Visit | Attending: Radiation Oncology | Admitting: Radiation Oncology

## 2023-07-02 DIAGNOSIS — Z510 Encounter for antineoplastic radiation therapy: Secondary | ICD-10-CM | POA: Diagnosis not present

## 2023-07-02 DIAGNOSIS — C4359 Malignant melanoma of other part of trunk: Secondary | ICD-10-CM | POA: Diagnosis not present

## 2023-07-02 LAB — RAD ONC ARIA SESSION SUMMARY
Course Elapsed Days: 5
Plan Fractions Treated to Date: 3
Plan Prescribed Dose Per Fraction: 6 Gy
Plan Total Fractions Prescribed: 5
Plan Total Prescribed Dose: 30 Gy
Reference Point Dosage Given to Date: 18 Gy
Reference Point Session Dosage Given: 6 Gy
Session Number: 3

## 2023-07-03 ENCOUNTER — Ambulatory Visit

## 2023-07-04 ENCOUNTER — Ambulatory Visit
Admission: RE | Admit: 2023-07-04 | Discharge: 2023-07-04 | Discharge status: Home / Self Care | Source: Ambulatory Visit | Attending: Radiation Oncology

## 2023-07-04 ENCOUNTER — Other Ambulatory Visit: Payer: Self-pay

## 2023-07-04 ENCOUNTER — Ambulatory Visit

## 2023-07-04 DIAGNOSIS — Z510 Encounter for antineoplastic radiation therapy: Secondary | ICD-10-CM | POA: Diagnosis not present

## 2023-07-04 DIAGNOSIS — C4359 Malignant melanoma of other part of trunk: Secondary | ICD-10-CM | POA: Diagnosis not present

## 2023-07-04 LAB — RAD ONC ARIA SESSION SUMMARY
Course Elapsed Days: 7
Plan Fractions Treated to Date: 4
Plan Prescribed Dose Per Fraction: 6 Gy
Plan Total Fractions Prescribed: 5
Plan Total Prescribed Dose: 30 Gy
Reference Point Dosage Given to Date: 24 Gy
Reference Point Session Dosage Given: 6 Gy
Session Number: 4

## 2023-07-05 ENCOUNTER — Ambulatory Visit

## 2023-07-06 ENCOUNTER — Ambulatory Visit
Admission: RE | Admit: 2023-07-06 | Discharge: 2023-07-06 | Disposition: A | Discharge status: Home / Self Care | Source: Ambulatory Visit | Attending: Radiation Oncology | Admitting: Radiation Oncology

## 2023-07-06 ENCOUNTER — Ambulatory Visit

## 2023-07-06 ENCOUNTER — Other Ambulatory Visit: Payer: Self-pay

## 2023-07-06 DIAGNOSIS — Z510 Encounter for antineoplastic radiation therapy: Secondary | ICD-10-CM | POA: Diagnosis not present

## 2023-07-06 DIAGNOSIS — C4359 Malignant melanoma of other part of trunk: Secondary | ICD-10-CM | POA: Diagnosis not present

## 2023-07-06 LAB — RAD ONC ARIA SESSION SUMMARY
Course Elapsed Days: 9
Plan Fractions Treated to Date: 5
Plan Prescribed Dose Per Fraction: 6 Gy
Plan Total Fractions Prescribed: 5
Plan Total Prescribed Dose: 30 Gy
Reference Point Dosage Given to Date: 30 Gy
Reference Point Session Dosage Given: 6 Gy
Session Number: 5

## 2023-07-09 ENCOUNTER — Ambulatory Visit

## 2023-07-09 NOTE — Radiation Completion Notes (Signed)
 Patient Name: MARIEO, FIPPS MRN: [id number] Date of Birth: [birth year] Referring Physician: Lockie Rima, M.D. Date of Service: 2023-07-09 Radiation Oncologist: Colie Dawes, M.D. [facility] -                              RADIATION ONCOLOGY END OF TREATMENT NOTE     Diagnosis: C43.59 Malignant melanoma of other part of trunk Staging on 2023-06-15: Melanoma of abdominal wall (HCC) T=pT4a, N=pN1, M=cM0 Intent: Curative     ==========DELIVERED PLANS==========  First Treatment Date: 2023-06-27 Last Treatment Date: 2023-07-06   Plan Name: [id number]_Wall_BO Site: Abdomen Technique: IMRT Mode: Photon Dose Per Fraction: 6 Gy Prescribed Dose (Delivered / Prescribed): 30 Gy / 30 Gy Prescribed Fxs (Delivered / Prescribed): 5 / 5     ==========ON TREATMENT VISIT DATES========== 2023-07-02     ==========UPCOMING VISITS==========       ==========APPENDIX - ON TREATMENT VISIT NOTES==========   See weekly On Treatment Notes in Epic for details in the Media tab (listed as Progress notes on the On Treatment Visit Dates listed above).

## 2023-07-10 ENCOUNTER — Ambulatory Visit

## 2023-07-11 ENCOUNTER — Ambulatory Visit

## 2023-07-12 ENCOUNTER — Ambulatory Visit

## 2023-07-13 ENCOUNTER — Ambulatory Visit

## 2023-07-16 ENCOUNTER — Ambulatory Visit

## 2023-07-17 ENCOUNTER — Ambulatory Visit

## 2023-07-18 ENCOUNTER — Ambulatory Visit

## 2023-07-19 ENCOUNTER — Ambulatory Visit

## 2023-07-20 ENCOUNTER — Ambulatory Visit

## 2023-07-21 NOTE — Progress Notes (Unsigned)
 [facility] CONSULT NOTE  Patient Care Team: Jearldine Mina, MD as PCP - General (Internal Medicine)  ASSESSMENT & PLAN:  Anthony Hardin is a 73 y.o.male with history of HTN, Stroke being seen at [facility] for pT4aN1cM0 superficial spreading melanoma and desmoplastic component of abdominal wall.  Diagnosis: pT4aN1cM0 with microscopic satellitosis, negative SLN. Assessment & Plan   No orders of the defined types were placed in this encounter.   The total time spent in the appointment was {CHL ONC TIME VISIT - [id number]} encounter with patients including review of chart and various tests results, discussions about plan of care and coordination of care plan   All questions were answered. The patient knows to call the clinic with any problems, questions or concerns. No barriers to learning was detected.  [name], MD 5/10/20259:53 PM  CHIEF COMPLAINTS/PURPOSE OF CONSULTATION:  ***  HISTORY OF PRESENTING ILLNESS:  Anthony Hardin 73 y.o. male is here because of *** I have reviewed his chart and materials related to his cancer extensively and collaborated history with the patient. Summary of oncologic history is as follows: Oncology History  Melanoma of abdominal wall (HCC)  06/15/2023 Initial Diagnosis   Melanoma of abdominal wall (HCC)   06/15/2023 Cancer Staging   Staging form: Melanoma of the Skin, AJCC 8th Edition - Pathologic stage from 06/15/2023: Stage IIIC (pT4a, pN1, cM0) - Signed by Colie Dawes, MD on 06/15/2023 Stage prefix: Initial diagnosis     MEDICAL HISTORY:  Past Medical History:  Diagnosis Date   Hypertension    Stroke [facility])    2013 hemorragic stroke. L side weakness    SURGICAL HISTORY: Past Surgical History:  Procedure Laterality Date   COLONOSCOPY     MELANOMA EXCISION Right 04/18/2023   Procedure: WLE/AFC RIGHT ABDOMINAL WALL;  Surgeon: Lockie Rima, MD;  Location: [facility];  Service: General;   Laterality: Right;   SENTINEL NODE BIOPSY Right 04/18/2023   Procedure: SENTINEL LYMPH NODE BIOPSY;  Surgeon: Lockie Rima, MD;  Location:  SURGERY CENTER;  Service: General;  Laterality: Right;  Right axilla sentinel node excised in addition to groin node.    SOCIAL HISTORY: Social History   Socioeconomic History   Marital status: Single    Spouse name: Not on file   Number of children: 0   Years of education: college   Highest education level: Not on file  Occupational History   Occupation: [employer]  Tobacco Use   Smoking status: Never   Smokeless tobacco: Never  Substance and Sexual Activity   Alcohol use: Yes    Alcohol/week: 2.0 standard drinks of alcohol    Types: 2 Cans of beer per week   Drug use: No   Sexual activity: Not Currently  Other Topics Concern   Not on file  Social History Narrative   Pt lives at home alone. He does not have any children and has a PHD education level. He quit smoking 1/2 pack of cigarettes in 1980 and quit marijuana use in 1976. He has 1-2 alcohol drinks a week and 2 cups of coffee daily.    Social Drivers of Corporate investment banker Strain: Not on file  Food Insecurity: No Food Insecurity (06/15/2023)   Hunger Vital Sign    Worried About Running Out of Food in the Last Year: Never true    Ran Out of Food in the Last Year: Never true  Transportation Needs: No Transportation Needs (06/15/2023)   PRAPARE -  Administrator, Civil Service (Medical): No    Lack of Transportation (Non-Medical): No  Physical Activity: Not on file  Stress: Not on file  Social Connections: Not on file  Intimate Partner Violence: Not At Risk (06/15/2023)   Humiliation, Afraid, Rape, and Kick questionnaire    Fear of Current or Ex-Partner: No    Emotionally Abused: No    Physically Abused: No    Sexually Abused: No    FAMILY HISTORY: Family History  Problem Relation Age of Onset   Hypertension Mother    Hypertension Father    Anuerysm  Paternal Grandfather    Stroke Paternal Aunt     ALLERGIES:  is allergic to scallops [shellfish allergy].  MEDICATIONS:  Current Outpatient Medications  Medication Sig Dispense Refill   amLODipine  (NORVASC ) 10 MG tablet Take 1 tablet (10 mg total) by mouth daily. 30 tablet 1   atorvastatin (LIPITOR) 10 MG tablet Take 20 mg by mouth daily.      hydrochlorothiazide  (HYDRODIURIL ) 25 MG tablet Take 1 tablet (25 mg total) by mouth daily. 30 tablet 1   lisinopril  (PRINIVIL ,ZESTRIL ) 20 MG tablet Take 1 tablet (20 mg total) by mouth daily. 30 tablet 1   oxyCODONE  (OXY IR/ROXICODONE ) 5 MG immediate release tablet Take 1 tablet (5 mg total) by mouth every 6 (six) hours as needed for severe pain (pain score 7-10). 15 tablet 0   No current facility-administered medications for this visit.    REVIEW OF SYSTEMS:   All relevant systems were reviewed with the patient and are negative.  PHYSICAL EXAMINATION: ECOG PERFORMANCE STATUS: {CHL ONC ECOG [phone]}  There were no vitals filed for this visit. There were no vitals filed for this visit.  GENERAL: alert, no distress and comfortable SKIN: skin color is normal, no jaundice, rashes or significant lesions EYES: sclera clear OROPHARYNX: no exudate, no erythema NECK: supple LYMPH:  no palpable lymphadenopathy in the cervical, axillary regions LUNGS: Effort normal, no respiratory distress.  Clear to auscultation bilaterally HEART: regular rate & rhythm and no lower extremity edema ABDOMEN: soft, non-tender and nondistended Musculoskeletal: no point tenderness NEURO: no focal motor/sensory deficits  LABORATORY DATA:  I have reviewed the data as listed Lab Results  Component Value Date   WBC 8.4 11/05/2013   HGB 14.2 11/05/2013   HCT 40.6 11/05/2013   MCV 87.9 11/05/2013   PLT 196 11/05/2013   Recent Labs    04/16/23 1529  NA 135  K 4.8  CL 103  CO2 21*  GLUCOSE 90  BUN 19  CREATININE 0.90  CALCIUM 9.1  GFRNONAA >60     RADIOGRAPHIC STUDIES: I have personally reviewed the radiological images as listed and agreed with the findings in the report. No results found.

## 2023-07-23 ENCOUNTER — Ambulatory Visit

## 2023-07-23 ENCOUNTER — Inpatient Hospital Stay

## 2023-07-23 VITALS — BP 112/64 | HR 67 | Temp 97.2°F | Resp 17 | Wt 188.0 lb

## 2023-07-23 DIAGNOSIS — C4359 Malignant melanoma of other part of trunk: Secondary | ICD-10-CM | POA: Diagnosis not present

## 2023-07-23 DIAGNOSIS — Z808 Family history of malignant neoplasm of other organs or systems: Secondary | ICD-10-CM | POA: Insufficient documentation

## 2023-07-23 DIAGNOSIS — R948 Abnormal results of function studies of other organs and systems: Secondary | ICD-10-CM

## 2023-07-24 ENCOUNTER — Encounter: Payer: Self-pay | Admitting: Genetic Counselor

## 2023-07-24 ENCOUNTER — Inpatient Hospital Stay: Admitting: Genetic Counselor

## 2023-07-24 ENCOUNTER — Telehealth: Payer: Self-pay

## 2023-07-24 ENCOUNTER — Ambulatory Visit: Payer: Self-pay

## 2023-07-24 ENCOUNTER — Inpatient Hospital Stay

## 2023-07-24 ENCOUNTER — Ambulatory Visit

## 2023-07-24 DIAGNOSIS — Z808 Family history of malignant neoplasm of other organs or systems: Secondary | ICD-10-CM | POA: Diagnosis not present

## 2023-07-24 DIAGNOSIS — C4359 Malignant melanoma of other part of trunk: Secondary | ICD-10-CM

## 2023-07-24 DIAGNOSIS — Z1379 Encounter for other screening for genetic and chromosomal anomalies: Secondary | ICD-10-CM

## 2023-07-24 LAB — CMP (CANCER CENTER ONLY)
ALT: 23 U/L (ref 0–44)
AST: 19 U/L (ref 15–41)
Albumin: 4.5 g/dL (ref 3.5–5.0)
Alkaline Phosphatase: 52 U/L (ref 38–126)
Anion gap: 7 (ref 5–15)
BUN: 18 mg/dL (ref 8–23)
CO2: 26 mmol/L (ref 22–32)
Calcium: 9.6 mg/dL (ref 8.9–10.3)
Chloride: 104 mmol/L (ref 98–111)
Creatinine: 0.91 mg/dL (ref 0.61–1.24)
GFR, Estimated: >60 mL/min (ref >60)
Glucose, Bld: 112 mg/dL — ABNORMAL HIGH (ref 70–99)
Potassium: 3.8 mmol/L (ref 3.5–5.1)
Sodium: 137 mmol/L (ref 135–145)
Total Bilirubin: 0.6 mg/dL (ref 0.0–1.2)
Total Protein: 7.5 g/dL (ref 6.5–8.1)

## 2023-07-24 LAB — CBC WITH DIFFERENTIAL (CANCER CENTER ONLY)
Abs Immature Granulocytes: 0.02 10*3/uL (ref 0.00–0.07)
Basophils Absolute: 0 10*3/uL (ref 0.0–0.1)
Basophils Relative: 1 %
Eosinophils Absolute: 0.1 10*3/uL (ref 0.0–0.5)
Eosinophils Relative: 2 %
HCT: 45 % (ref 39.0–52.0)
Hemoglobin: 15.8 g/dL (ref 13.0–17.0)
Immature Granulocytes: 0 %
Lymphocytes Relative: 24 %
Lymphs Abs: 1.4 10*3/uL (ref 0.7–4.0)
MCH: 29.7 pg (ref 26.0–34.0)
MCHC: 35.1 g/dL (ref 30.0–36.0)
MCV: 84.6 fL (ref 80.0–100.0)
Monocytes Absolute: 0.7 10*3/uL (ref 0.1–1.0)
Monocytes Relative: 12 %
Neutro Abs: 3.6 10*3/uL (ref 1.7–7.7)
Neutrophils Relative %: 61 %
Platelet Count: 179 10*3/uL (ref 150–400)
RBC: 5.32 MIL/uL (ref 4.22–5.81)
RDW: 12.5 % (ref 11.5–15.5)
WBC Count: 5.9 10*3/uL (ref 4.0–10.5)
nRBC: 0 % (ref 0.0–0.2)

## 2023-07-24 LAB — LACTATE DEHYDROGENASE: LDH: 147 U/L (ref 98–192)

## 2023-07-24 LAB — GENETIC SCREENING ORDER

## 2023-07-24 NOTE — Progress Notes (Signed)
 REFERRING PROVIDER: Lockie Rima, MD   PRIMARY PROVIDER:  Jearldine Mina, MD  PRIMARY REASON FOR VISIT:  1. Melanoma of abdominal wall (HCC)   2. Family history of melanoma   3. Ashkenazi Jewish ancestry requiring population-specific genetic screening     HISTORY OF PRESENT ILLNESS:   Anthony Hardin, a 73 y.o. male, was seen for a [facility] cancer genetics consultation at the request of Lockie Rima, MD due to a personal and family history of cancer.  Anthony Hardin presents to clinic today to discuss the possibility of a hereditary predisposition to cancer, genetic testing, and to further clarify his future cancer risks, as well as potential cancer risks for family members.   In 2025, at the age of 60, Anthony Hardin was diagnosed with melanoma of the abdominal wall, stage III. He reports that the melanoma developed from a birthmark he has had for many years, but recently noticed changes in size and color.    CANCER HISTORY:  Oncology History  Melanoma of abdominal wall (HCC)  03/2023 Initial Diagnosis   Melanoma of abdominal wall [facility])  He has a birth mark and noted larger last spring and then continued to change in size and color by late summer. He saw dermatology in Jan 2025. It went from smaller than a dime most of his life to about double the size when he saw dermatology.   04/18/2023 Pathology Results   A.   SKIN, RIGHT ABDOMINAL WALL, EXCISION: RESIDUAL MALIGNANT MELANOMA,  4.7 MM PT4 IN EXCISIONAL SPECIMEN, WITH SUPERFICIAL SPREADING AND  ADDITIONAL DESMOPLASTIC COMPONENT, MICROSCOPIC SATELLITE (4.0 X 1.8 MM),  LYMPHOVASCULAR AND PERINEURAL INVASION, MARGINS FREE    05/23/2023 PET scan   PET 1. No findings of active malignancy. 2. Low-grade activity along the margins of a 260 cc fluid collection along the superficial fascia margin of the right anterior abdominal wall, likely a postoperative fluid collection. Favor inflammatory activity over residual/recurrent malignancy. 3.  Low-grade activity along the lateral margin of a 2.4 cm right inguinal fluid collection, compatible with lymph node dissection site, likely inflammatory activity rather than neoplastic. 4. Right axillary stranding compatible with recent operative site, with low-grade activity along its margins, likely postinflammatory/post surgical activity. 5.  Aortic Atherosclerosis (ICD10-I70.0). 6. 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 7. Nonspecific subcutaneous edema in the left calf and dorsal left foot.   06/15/2023 Cancer Staging   Staging form: Melanoma of the Skin, AJCC 8th Edition - Pathologic stage from 06/15/2023: Stage IIIC (pT4a, pN1, cM0) - Signed by Colie Dawes, MD on 06/15/2023 Stage prefix: Initial diagnosis   06/27/2023 - 07/06/2023 Radiation Therapy   30 Gy/5 Fx radiation       RELEVANT HISTORY:  Completes PSA screening with PCP.  Colonoscopy, three polyps detected. Unsure of pathology.   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    Social History   Socioeconomic History  Marital status: Single    Spouse name: Not on file   Number of children: 0   Years of education: college   Highest education level: Not on file  Occupational History   Occupation: [employer]  Tobacco Use   Smoking status: Never   Smokeless tobacco: Never  Substance and Sexual Activity   Alcohol use: Yes     Alcohol/week: 2.0 standard drinks of alcohol    Types: 2 Cans of beer per week   Drug use: No   Sexual activity: Not Currently  Other Topics Concern   Not on file  Social History Narrative   Pt lives at home alone. He does not have any children and has a PHD education level. He quit smoking 1/2 pack of cigarettes in 1980 and quit marijuana use in 1976. He has 1-2 alcohol drinks a week and 2 cups of coffee daily.    Social Drivers of Corporate investment banker Strain: Not on file  Food Insecurity: No Food Insecurity (06/15/2023)   Hunger Vital Sign    Worried About Running Out of Food in the Last Year: Never true    Ran Out of Food in the Last Year: Never true  Transportation Needs: No Transportation Needs (06/15/2023)   PRAPARE - Administrator, Civil Service (Medical): No    Lack of Transportation (Non-Medical): No  Physical Activity: Not on file  Stress: Not on file  Social Connections: Not on file     FAMILY HISTORY:  We obtained a detailed, 4-generation family history.  Significant diagnoses are listed below: Family History  Problem Relation Age of Onset   Hypertension Mother    Hypertension Father    Skin cancer Father    Melanoma Maternal Aunt 71 - 39   Stroke Paternal Aunt    Anuerysm Paternal Grandfather    Throat cancer Other 90 - 43       maternal first cousin once removed   Breast cancer Other        paternal great-great grandmother    Anthony Hardin is unaware of previous family history of genetic testing for hereditary cancer risks. Patient's maternal ancestors are of German/French/European descent, and paternal ancestors are of German/French descent. There is a percentage of Ashkenazi Jewish ancestry on both sides of family. Anthony Hardin reports a maternal aunt diagnosed with melanoma at age 38 on her face. He reports his father was diagnosed with skin cancer on his back, unsure of pathology. Reports it developed from birthmark on father's back. Reports a  paternal great-great-grandmother diagnosed with breast cancer. Reports a paternal first cousin once removed diagnosed with throat cancer in her 38s, deceased in her 53s, history of cigarette use.     GENETIC COUNSELING ASSESSMENT: Anthony Hardin is a 73 y.o. male with a personal and family history of cancer which is somewhat suggestive of an inherited predisposition to cancer. We, therefore, discussed and recommended the following at today's visit.   DISCUSSION: We discussed that, in general, most cancer is not inherited in families, but instead is sporadic or familial. Sporadic cancers occur by chance and typically happen at older ages (>50 years) as this type of cancer is caused by genetic changes acquired during an individual's lifetime. Some families have more cancers than would be expected by chance; however, the ages or types of cancer are not consistent with a known genetic mutation or known genetic mutations have been ruled out. This type of familial cancer is thought to be due to  a combination of multiple genetic, environmental, hormonal, and lifestyle factors. While this combination of factors likely increases the risk of cancer, the exact source of this risk is not currently identifiable or testable.  We discussed that 5 - 10% of cancer is hereditary. We discussed that testing is beneficial for several reasons including knowing how to follow individuals after completing their treatment, identifying whether potential treatment options such as PARP inhibitors would be beneficial, and understand if other family members could be at risk for cancer and allow them to undergo genetic testing.   We reviewed the characteristics, features and inheritance patterns of hereditary cancer syndromes. We also discussed genetic testing, including the appropriate family members to test, the process of testing, insurance coverage and turn-around-time for results. We discussed the implications of a negative, positive,  carrier and/or variant of uncertain significant result. Anthony Hardin  was offered a common hereditary cancer panel (36+ genes) and an expanded pan-cancer panel (70+ genes). Anthony Hardin was informed of the benefits and limitations of each panel, including that expanded pan-cancer panels contain genes that do not have clear management guidelines at this point in time.  We also discussed that as the number of genes included on a panel increases, the chances of variants of uncertain significance increases. Anthony Hardin decided to pursue genetic testing for the 89 gene panel. The CancerNext-Expanded gene panel offered by [facility] and includes sequencing, rearrangement, and RNA analysis for the following 76 genes: AIP, ALK, APC, ATM, AXIN2, BAP26m BARD1, BMPR1Am BRCA1, BRCA2, BRIP1, CDC73, CDH1, CDK4, CDKN1B, CDKN2A, CEBPA, CHEK2, CTNNA1, DDX41, DICER1, EGFR, EPCAM, ETV6, FH, FLCN, GATA2, GREM1, HOXB13, KIT, LZTR1, MAX, MBD4, MEN1, MET, MITF, MLH1, MSH2, MSH3, MSH6, MUTYH, NF1, NF2, NTHL1, PALB2, PDGFRA, PHOX2B, PMS2, POLD1, POLE, POT1, PRKAR1A, PTCH54m PTEN, RAD51C, RAD51D, RB1, RET, RPS20, RUNX1, SDHA, SDHAF2, SDHB, SDHC, SDHD, SMAD4, SMARCA4, SMARCB1, SMARCE1, STK11, SUFU, TMEM127, TP53, TSC1, TSC2, VHL, WT1.    Based on Anthony Hardin's personal and family history of cancer, he meets medical criteria for genetic testing. Despite that he meets criteria, he may still have an out of pocket cost. We discussed that if his out of pocket cost for testing is over $100, the laboratory will call and confirm whether he wants to proceed with testing.  If the out of pocket cost of testing is less than $100 he will be billed by the genetic testing laboratory.   We discussed that some people do not want to undergo genetic testing due to fear of genetic discrimination.  The Genetic Information Nondiscrimination Act (GINA) was signed into federal law in 2008. GINA prohibits health insurers and most employers from discriminating  against individuals based on genetic information (including the results of genetic tests and family history information). According to GINA, health insurance companies cannot consider genetic information to be a preexisting condition, nor can they use it to make decisions regarding coverage or rates. GINA also makes it illegal for most employers to use genetic information in making decisions about hiring, firing, promotion, or terms of employment. It is important to note that GINA does not offer protections for life insurance, disability insurance, or long-term care insurance. GINA does not apply to those in the [employer], those who work for companies with less than 15 employees, and new life insurance or long-term disability insurance policies.  Health status due to a cancer diagnosis is not protected under GINA. More information about GINA can be found by visiting [email or web address].  PLAN: After considering the risks, benefits, and  limitations, Anthony Hardin provided informed consent to pursue genetic testing and the blood sample was sent to [facility] for analysis of the CancerNext-Expanded +RNAinsight test. Results should be available within approximately 2-3 weeks' time, at which point they will be disclosed by telephone to Anthony Hardin, as will any additional recommendations warranted by these results. Anthony Hardin will receive a summary of his genetic counseling visit and a copy of his results once available. This information will also be available in Epic.   Lastly, we encouraged Anthony Hardin to remain in contact with cancer genetics annually so that we can continuously update the family history and inform him of any changes in cancer genetics and testing that may be of benefit for this family.   Anthony Hardin's questions were answered to his satisfaction today. Our contact information was provided should additional questions or concerns arise. Thank you for the referral and allowing us  to  share in the care of your patient.   Jobie Mulders, MS, [facility] Licensed, Retail [email or web address] phone: [phone]  70 minutes were spent on the date of the encounter in service to the patient including preparation, face-to-face consultation, documentation and care coordination.  The patient was seen alone.  Drs. Johnna Nakai, and/or Gudena were available for questions, if needed..    _______________________________________________________________________ For Office Staff:  Number of people involved in session: 1 Was an Intern/ student involved with case: no

## 2023-07-24 NOTE — Telephone Encounter (Signed)
 TC to inform of the below message by Dr. Alita Irwin. Pt verbalizes understanding. Pt asked that Dr. Alita Irwin be made aware that he is trying to get an e-copy of his genetic testing results from his dermatologist, but as of now he does not have them. Pt states that it may be best to contact [facility] to get these results. Per request of Dr. Alita Irwin, [facility] to [facility]. Placed on hold for approx 15 minutes w/o an answer. Will attempt to call and get results again tomorrow.   07/25/23 at 1100: TC to [facility] to request genetic testing results per Dr. Joseph Nickel request. Staff member stated this report would be faxed once patient signs a release form. Pt aware. Office fax number given.

## 2023-07-24 NOTE — Telephone Encounter (Signed)
-----   Message from [name] sent at 07/24/2023  3:49 PM EDT ----- Ethyl Hering please let him know no concerning findings from lab results today. Thanks

## 2023-07-24 NOTE — Assessment & Plan Note (Addendum)
 MRI of brain Adjuvant nivolumab PET restaging

## 2023-07-24 NOTE — Telephone Encounter (Signed)
 Scheduled patient's appts. Advised patient to contact us if rescheduling is needed. Provided my direct line.

## 2023-07-26 DIAGNOSIS — I6930 Unspecified sequelae of cerebral infarction: Secondary | ICD-10-CM | POA: Diagnosis not present

## 2023-07-26 DIAGNOSIS — Z1331 Encounter for screening for depression: Secondary | ICD-10-CM | POA: Diagnosis not present

## 2023-07-26 DIAGNOSIS — G8194 Hemiplegia, unspecified affecting left nondominant side: Secondary | ICD-10-CM | POA: Diagnosis not present

## 2023-07-26 DIAGNOSIS — I10 Essential (primary) hypertension: Secondary | ICD-10-CM | POA: Diagnosis not present

## 2023-07-26 DIAGNOSIS — E782 Mixed hyperlipidemia: Secondary | ICD-10-CM | POA: Diagnosis not present

## 2023-07-26 DIAGNOSIS — Z8249 Family history of ischemic heart disease and other diseases of the circulatory system: Secondary | ICD-10-CM | POA: Diagnosis not present

## 2023-07-26 DIAGNOSIS — C4359 Malignant melanoma of other part of trunk: Secondary | ICD-10-CM | POA: Diagnosis not present

## 2023-07-26 DIAGNOSIS — Z125 Encounter for screening for malignant neoplasm of prostate: Secondary | ICD-10-CM | POA: Diagnosis not present

## 2023-07-26 DIAGNOSIS — R7303 Prediabetes: Secondary | ICD-10-CM | POA: Diagnosis not present

## 2023-07-26 DIAGNOSIS — Z0000 Encounter for general adult medical examination without abnormal findings: Secondary | ICD-10-CM | POA: Diagnosis not present

## 2023-07-31 ENCOUNTER — Other Ambulatory Visit: Payer: Self-pay

## 2023-07-31 ENCOUNTER — Telehealth: Payer: Self-pay

## 2023-07-31 NOTE — Telephone Encounter (Signed)
 TC from pt, asking if he could schedule OV w/ Dr. Alita Irwin earlier than 6/23, stating he has decided he wants to undergo treatment. After consulting Dr. Alita Irwin, pt informed that he can schedule OV on 6/16 if scans ordered by Dr. Alita Irwin are completed by 6/9. Pt verbalizes understanding and states he will call Central Scheduling to set up. Scheduler aware to call pt to schedule OV w/ Dr. Alita Irwin 08/27/23.

## 2023-08-01 ENCOUNTER — Other Ambulatory Visit: Payer: Self-pay

## 2023-08-01 NOTE — Progress Notes (Incomplete)
 Anthony Hardin presents today for a one month follow up in the clinic.  Patient follow-up for a diagnosis of: Melanoma of Abdominal Wall    They completed their radiation on: 07/06/2023  Does the patient complain of any of the following: Chest pains/ SOB: *** Headaches/ Dizziness: *** Post radiation skin issues *** Joint pain/ Swelling: *** Motor function issues: *** Range of motion limitations: *** Fatigue post radiation: *** Appetite good/fair/poor: ***  Additional comments if applicable: ***

## 2023-08-02 ENCOUNTER — Telehealth: Payer: Self-pay

## 2023-08-02 NOTE — Telephone Encounter (Signed)
 VM received earlier today w/ pt requesting a refill of a cream/gel which Dr. Lurena Sally gave him in radiation therapy. No gel/cream noted in pt's med list. Delaine Favorite, radiation nurse, and she states pt received Sonafine Wound Dressing cream, which is given to patients directly. She gives this RN a tube of Sonafine to give to pt. Pt notified and states he plans to pick this up tomorrow. Tube being held in Dr. Joseph Nickel nurses office.

## 2023-08-08 ENCOUNTER — Ambulatory Visit: Payer: Self-pay | Admitting: Genetic Counselor

## 2023-08-08 ENCOUNTER — Telehealth: Payer: Self-pay | Admitting: Radiation Oncology

## 2023-08-08 ENCOUNTER — Telehealth: Payer: Self-pay | Admitting: Genetic Counselor

## 2023-08-08 DIAGNOSIS — Z1379 Encounter for other screening for genetic and chromosomal anomalies: Secondary | ICD-10-CM | POA: Insufficient documentation

## 2023-08-08 NOTE — Telephone Encounter (Signed)
 Received VM from asking to r/s upcoming 6/4 appt. Called pt back and we were able to r/s to 6/18.

## 2023-08-08 NOTE — Telephone Encounter (Signed)
 I spoke to Mr. Larner to review results of genetic testing. he had genetic testing with [facility] CancerNext-Expanded +RNAinsight. Testing did not identify any variants known to increase the risk for cancer.  Discussed that we do not know why he has melanoma or why there is cancer in the family. It could be due to a different gene that we are not testing, or maybe our current technology may not be able to pick something up.  It will be important for him to keep in contact with genetics to keep up with whether additional testing may be needed.  Please see counseling note for further detail on this result.

## 2023-08-08 NOTE — Progress Notes (Signed)
 HPI:  Mr. Toon was previously seen in the [facility] due to a personal and family history of cancer and concerns regarding a hereditary predisposition to cancer. Please refer to our prior cancer genetics clinic note for more information regarding our discussion, assessment and recommendations, at the time. Mr. Jewel's recent genetic test results were disclosed to him, as were recommendations warranted by these results. These results and recommendations are discussed in more detail below.  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 He saw dermatology in Jan 2025. It went from smaller than a dime most of his life to about double the size when he saw dermatology.   04/18/2023 Pathology Results   A.   SKIN, RIGHT ABDOMINAL WALL, EXCISION: RESIDUAL MALIGNANT MELANOMA,  4.7 MM PT4 IN EXCISIONAL SPECIMEN, WITH SUPERFICIAL SPREADING AND  ADDITIONAL DESMOPLASTIC COMPONENT, MICROSCOPIC SATELLITE (4.0 X 1.8 MM),  LYMPHOVASCULAR AND PERINEURAL INVASION, MARGINS FREE    05/23/2023 PET scan   PET 1. No findings of active malignancy. 2. Low-grade activity along the margins of a 260 cc fluid collection along the superficial fascia margin of the right anterior abdominal wall, likely a postoperative fluid collection. Favor inflammatory activity over residual/recurrent malignancy. 3. Low-grade activity along the lateral margin of a 2.4 cm right inguinal fluid collection, compatible with lymph node dissection site, likely inflammatory activity rather than neoplastic. 4. Right axillary stranding compatible with recent operative site, with low-grade activity along its margins, likely postinflammatory/post surgical activity. 5.  Aortic Atherosclerosis (ICD10-I70.0). 6. 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 7. Nonspecific subcutaneous edema in the left calf and dorsal left foot.   06/15/2023 Cancer Staging   Staging form: Melanoma of the Skin, AJCC 8th Edition - Pathologic stage from 06/15/2023: Stage IIIC (pT4a, pN1, cM0) - Signed by Colie Dawes, MD on 06/15/2023 Stage prefix: Initial diagnosis   06/27/2023 - 07/06/2023 Radiation Therapy   30 Gy/5 Fx radiation    09/04/2023 -  Chemotherapy   Patient is on Treatment Plan : MELANOMA ADJUVANT Nivolumab (240) q14d      Genetic Testing   Negative CancerNext-Expanded +RNAinsight. The CancerNext-Expanded gene panel offered by [facility] and includes sequencing, rearrangement, and RNA analysis for the following 77 genes: AIP, ALK, APC, ATM, AXIN2, BAP72m BARD1, BMPR1Am BRCA1, BRCA2, BRIP1, CDC73, CDH1, CDK4, CDKN1B, CDKN2A, CEBPA, CHEK2, CTNNA1, DDX41, DICER1, EGFR, EPCAM, ETV6, FH, FLCN, GATA2, GREM1, HOXB13, KIT, LZTR1, MAX, MBD4, MEN1, MET, MITF, MLH1, MSH2, MSH3, MSH6, MUTYH, NF1, NF2, NTHL1, PALB2, PDGFRA, PHOX2B, PMS2, POLD1, POLE, POT1, PRKAR1A, PTCH47m PTEN, RAD51C, RAD51D, RB1, RET, RPS20, RUNX1, SDHA, SDHAF2, SDHB, SDHC, SDHD, SMAD4, SMARCA4, SMARCB1, SMARCE1, STK11, SUFU, TMEM127, TP53, TSC1, TSC2, VHL, WT1. Report date 08/03/23.      FAMILY HISTORY:  We obtained a detailed, 4-generation family history.  Significant diagnoses are listed below: Family History  Problem Relation Age of Onset   Hypertension Mother    Hypertension Father    Skin cancer Father    Melanoma Maternal Aunt 59 - 44   Stroke Paternal Aunt    Anuerysm Paternal Grandfather    Throat cancer Other 57 - 74       maternal first cousin once removed   Breast cancer Other        paternal great-great grandmother  Mr. Waldrop is unaware of previous family history of genetic testing for hereditary cancer risks. Patient's maternal ancestors are of German/French/European descent, and paternal ancestors are of German/French descent. There  is a percentage of Ashkenazi Jewish ancestry on both sides of family. Mr. Mcconville reports a maternal aunt diagnosed with melanoma at age 67 on her face. He reports his father was diagnosed with skin cancer on his back, unsure of pathology. Reports it developed from birthmark on father's back. Reports a paternal great-great-grandmother diagnosed with breast cancer. Reports a paternal first cousin once removed diagnosed with throat cancer in her 76s, deceased in her 30s, history of cigarette use.      GENETIC TEST RESULTS: Genetic testing reported out on 08/03/23 through the [facility] CancerNext-Expanded +RNAinsight panel found no pathogenic mutations. The CancerNext-Expanded gene panel offered by [facility] and includes sequencing, rearrangement, and RNA analysis for the following 77 genes: AIP, ALK, APC, ATM, AXIN2, BAP1m BARD1, BMPR1Am BRCA1, BRCA2, BRIP1, CDC73, CDH1, CDK4, CDKN1B, CDKN2A, CEBPA, CHEK2, CTNNA1, DDX41, DICER1, EGFR, EPCAM, ETV6, FH, FLCN, GATA2, GREM1, HOXB13, KIT, LZTR1, MAX, MBD4, MEN1, MET, MITF, MLH1, MSH2, MSH3, MSH6, MUTYH, NF1, NF2, NTHL1, PALB2, PDGFRA, PHOX2B, PMS2, POLD1, POLE, POT1, PRKAR1A, PTCH1m PTEN, RAD51C, RAD51D, RB1, RET, RPS20, RUNX1, SDHA, SDHAF2, SDHB, SDHC, SDHD, SMAD4, SMARCA4, SMARCB1, SMARCE1, STK11, SUFU, TMEM127, TP53, TSC1, TSC2, VHL, WT1. The test report has been scanned into EPIC and is located under the Molecular Pathology section of the Results Review tab.  A portion of the result report is included below for reference.     We discussed with Mr. Stmarie that because current genetic testing is not perfect, it is possible there may be a gene mutation in one of these genes that current testing cannot detect, but that chance is small.  We also discussed, that there could be another gene that has not yet been discovered, or that we have not yet tested, that is responsible for the cancer diagnoses in the family. It is also possible there is a hereditary cause for  the cancer in the family that Mr. Azucena did not inherit and therefore was not identified in his testing.  Therefore, it is important to remain in touch with cancer genetics in the future so that we can continue to offer Mr. Trautmann the most up to date genetic testing.   ADDITIONAL GENETIC TESTING: We discussed with Mr. Paxton that his genetic testing was fairly extensive.  If there are genes identified to increase cancer risk that can be analyzed in the future, we would be happy to discuss and coordinate this testing at that time.    CANCER SCREENING RECOMMENDATIONS: Mr. Calico's test result is considered negative (normal).  This means that we have not identified a hereditary cause for his personal and family history of cancer at this time. Most cancers happen by chance and this negative test suggests that his personal and family history of cancer may fall into this category.    Possible reasons for Mr. Noga's negative genetic test include:  1. There may be a gene mutation in one of these genes that current testing methods cannot detect but that chance is small.  2. There could be another gene that has not yet been discovered, or that we have not yet tested, that is responsible for the cancer diagnoses in the family.  3.  There may be no hereditary risk for cancer in the family. The cancers in Mr. Kargbo and/or his family may be sporadic/familial or due to  other genetic and environmental factors. 4. It is also possible there is a hereditary cause for the cancer in the family that Mr. Buchanan did not inherit.  Therefore, it is recommended he continue to follow the cancer management and screening guidelines provided by his oncology and primary healthcare provider. An individual's cancer risk and medical management are not determined by genetic test results alone. Overall cancer risk assessment incorporates additional factors, including personal medical history, family history, and any available  genetic information that may result in a personalized plan for cancer prevention and surveillance  Given Mr. Abrell's personal and family histories, we must interpret these negative results with some caution.  Families with features suggestive of hereditary risk for cancer tend to have multiple family members with cancer, diagnoses in multiple generations and diagnoses before the age of 52. Mr. Shake's family exhibits some of these features. Thus, this result may simply reflect our current inability to detect all mutations within these genes or there may be a different gene that has not yet been discovered or tested.   An individual's cancer risk and medical management are not determined by genetic test results alone. Overall cancer risk assessment incorporates additional factors, including personal medical history, family history, and any available genetic information that may result in a personalized plan for cancer prevention and surveillance.  RECOMMENDATIONS FOR FAMILY MEMBERS:  Individuals in this family might be at some increased risk of developing cancer, over the general population risk, simply due to the family history of cancer.  We recommended women in this family have a yearly mammogram beginning at age 65, or 57 years younger than the earliest onset of cancer, an annual clinical breast exam, and perform monthly breast self-exams. Women in this family should also have a gynecological exam as recommended by their primary provider. All family members should be referred for colonoscopy starting at age 47, or 68 years younger than the earliest onset of cancer.  FOLLOW-UP: Lastly, we discussed with Mr. Fukushima that cancer genetics is a rapidly advancing field and it is possible that new genetic tests will be appropriate for him and/or his family members in the future. We encouraged him to remain in contact with cancer genetics on an annual basis so we can update his personal and family histories  and let him know of advances in cancer genetics that may benefit this family.   Our contact number was provided. Mr. Giovannetti's questions were answered to his satisfaction, and he knows he is welcome to call us  at anytime with additional questions or concerns.   Jobie Mulders, MS, [facility] Licensed, Retail [email or web address] [phone]

## 2023-08-08 NOTE — Progress Notes (Signed)
 Opened in error

## 2023-08-10 ENCOUNTER — Ambulatory Visit (HOSPITAL_COMMUNITY): Admission: RE | Admit: 2023-08-10 | Source: Ambulatory Visit

## 2023-08-10 ENCOUNTER — Ambulatory Visit (HOSPITAL_COMMUNITY)
Admission: RE | Admit: 2023-08-10 | Discharge: 2023-08-10 | Disposition: A | Discharge status: Home / Self Care | Source: Ambulatory Visit

## 2023-08-10 DIAGNOSIS — R948 Abnormal results of function studies of other organs and systems: Secondary | ICD-10-CM | POA: Insufficient documentation

## 2023-08-10 DIAGNOSIS — C4359 Malignant melanoma of other part of trunk: Secondary | ICD-10-CM | POA: Insufficient documentation

## 2023-08-10 DIAGNOSIS — G319 Degenerative disease of nervous system, unspecified: Secondary | ICD-10-CM | POA: Diagnosis not present

## 2023-08-10 DIAGNOSIS — I639 Cerebral infarction, unspecified: Secondary | ICD-10-CM | POA: Diagnosis not present

## 2023-08-10 DIAGNOSIS — R9082 White matter disease, unspecified: Secondary | ICD-10-CM | POA: Diagnosis not present

## 2023-08-10 DIAGNOSIS — C439 Malignant melanoma of skin, unspecified: Secondary | ICD-10-CM | POA: Diagnosis not present

## 2023-08-10 LAB — GLUCOSE, CAPILLARY: Glucose-Capillary: 126 mg/dL — ABNORMAL HIGH (ref 70–99)

## 2023-08-10 MED ORDER — GADOBUTROL 1 MMOL/ML IV SOLN
8.0000 mL | Freq: Once | INTRAVENOUS | Status: AC | PRN
  Administered 2023-08-10: 8 mL via INTRAVENOUS

## 2023-08-10 MED ORDER — FLUDEOXYGLUCOSE F - 18 (FDG) INJECTION
9.6200 | Freq: Once | INTRAVENOUS | Status: AC
  Administered 2023-08-10: 9.62 via INTRAVENOUS

## 2023-08-15 ENCOUNTER — Ambulatory Visit: Admitting: Radiation Oncology

## 2023-08-17 NOTE — Progress Notes (Signed)
 Mr. Lammert presents today for a one month follow up in the clinic.  Patient follow-up for a diagnosis of: Melanoma of Abdominal Wall   Patient feels well after treatment and not having any symptoms.  They completed their radiation on: 07/06/2023  Does the patient complain of any of the following: Chest pains/ SOB: None Headaches/ Dizziness: None Post radiation skin issues None Joint pain/ Swelling: None Motor function issues: None Range of motion limitations: None Fatigue post radiation: None Appetite good/fair/poor: Appetite is good  Additional comments if applicable: None  BP 112/70 (BP Location: Right Arm, Patient Position: Sitting, Cuff Size: Normal)   Pulse 67   Temp 98.2 F (36.8 C)   Resp 20   Ht 5' 9 (1.753 m)   Wt 187 lb 3.2 oz (84.9 kg)   SpO2 96%   BMI 27.64 kg/m   Wt Readings from Last 3 Encounters:  08/29/23 187 lb 3.2 oz (84.9 kg)  08/27/23 190 lb 9.6 oz (86.5 kg)  07/23/23 188 lb (85.3 kg)

## 2023-08-24 NOTE — Progress Notes (Unsigned)
 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  We reviewed the MRI and PET today. Discussed results.    From CheckMate 238, one year of adjuvant nivolumab significantly improved recurrence-free survival (RFS) and distant metastasis-free survival. N Engl J Med 2017;377:1824-183. At a minimum follow-up of 62 months, RFS with nivolumab was 50% vs 39% and DMFS was 58% vs 51% with ipi. Overall survival was 76% vs 72% with ipi. Treatment-related grade 3 or 4 adverse events were reported in 14.4% of patients. Small retrospective studies showed higher response rate in this subtype to immunotherapy. Nature. 2018;553(7688):347.   Plan to start treatment about 7/3 or 7/1 as able.  May seek neurology consultation for memory loss in the future. Assessment & Plan Melanoma of abdominal wall (HCC) Will plan to start treatment 7/3 or 7/1  Return with each cycle, lab and visit, then infusion  Orders Placed This Encounter  Procedures   CBC with Differential (Cancer Center Only)    Standing Status:   Future    Expected Date:   09/13/2023    Expiration Date:   09/12/2024   CMP (Cancer Center only)    Standing Status:   Future    Expected Date:   09/13/2023    Expiration Date:   09/12/2024   TSH    Standing Status:   Future    Expected Date:   09/13/2023    Expiration Date:   09/12/2024   CBC with Differential (Cancer Center Only)    Standing Status:   Future    Expected Date:   09/27/2023    Expiration Date:   09/26/2024   CMP (Cancer Center only)    Standing Status:   Future    Expected Date:   09/27/2023    Expiration Date:   09/26/2024   CBC with Differential  (Cancer Center Only)    Standing Status:   Future    Expected Date:   10/11/2023    Expiration Date:   10/10/2024   CMP (Cancer Center only)    Standing Status:   Future    Expected Date:   10/11/2023    Expiration Date:   10/10/2024   TSH    Standing Status:   Future    Expected Date:   10/11/2023    Expiration Date:   10/10/2024   CBC with Differential (Cancer Center Only)    Standing Status:   Future    Expected Date:   10/25/2023    Expiration Date:   10/24/2024   CMP (Cancer Center only)    Standing Status:   Future    Expected Date:   10/25/2023    Expiration Date:   10/24/2024   T4, free    Standing Status:   Future    Expected Date:   09/13/2023    Expiration Date:   09/12/2024   T4, free    Standing Status:   Future    Expected Date:   10/11/2023    Expiration Date:   10/10/2024   CBC with Differential (Cancer Center Only)    Standing Status:   Future    Expected Date:   11/08/2023    Expiration Date:   11/07/2024  CMP (Cancer Center only)    Standing Status:   Future    Expected Date:   11/08/2023    Expiration Date:   11/07/2024   CBC with Differential (Cancer Center Only)    Standing Status:   Future    Expected Date:   11/22/2023    Expiration Date:   11/21/2024   CMP (Cancer Center only)    Standing Status:   Future    Expected Date:   11/22/2023    Expiration Date:   11/21/2024   T4, free    Standing Status:   Future    Expected Date:   11/22/2023    Expiration Date:   11/21/2024   TSH    Standing Status:   Future    Expected Date:   11/22/2023    Expiration Date:   11/21/2024   CBC with Differential (Cancer Center Only)    Standing Status:   Future    Expected Date:   12/06/2023    Expiration Date:   12/05/2024   CMP (Cancer Center only)    Standing Status:   Future    Expected Date:   12/06/2023    Expiration Date:   12/05/2024   CBC with Differential (Cancer Center Only)    Standing Status:   Future    Expected Date:   12/20/2023    Expiration Date:   12/19/2024   CMP  (Cancer Center only)    Standing Status:   Future    Expected Date:   12/20/2023    Expiration Date:   12/19/2024   T4, free    Standing Status:   Future    Expected Date:   12/20/2023    Expiration Date:   12/19/2024   TSH    Standing Status:   Future    Expected Date:   12/20/2023    Expiration Date:   12/19/2024     [name], MD  INTERVAL HISTORY: Patient returns for follow-up. We went over the PET and MRI results.   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 He saw dermatology in Jan 2025. It went from smaller than a dime most of his life to about double the size when he saw dermatology.   04/18/2023 Pathology Results   A.   SKIN, RIGHT ABDOMINAL WALL, EXCISION: RESIDUAL MALIGNANT MELANOMA,  4.7 MM PT4 IN EXCISIONAL SPECIMEN, WITH SUPERFICIAL SPREADING AND  ADDITIONAL DESMOPLASTIC COMPONENT, MICROSCOPIC SATELLITE (4.0 X 1.8 MM),  LYMPHOVASCULAR AND PERINEURAL INVASION, MARGINS FREE    05/23/2023 PET scan   PET 1. No findings of active malignancy. 2. Low-grade activity along the margins of a 260 cc fluid collection along the superficial fascia margin of the right anterior abdominal wall, likely a postoperative fluid collection. Favor inflammatory activity over residual/recurrent malignancy. 3. Low-grade activity along the lateral margin of a 2.4 cm right inguinal fluid collection, compatible with lymph node dissection site, likely inflammatory activity rather than neoplastic. 4. Right axillary stranding compatible with recent operative site, with low-grade activity along its margins, likely postinflammatory/post surgical activity. 5.  Aortic Atherosclerosis (ICD10-I70.0). 6. 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 7. Nonspecific subcutaneous edema in the left calf and dorsal  left foot.   06/15/2023 Cancer Staging   Staging form: Melanoma of the Skin, AJCC 8th  Edition - Pathologic stage from 06/15/2023: Stage IIIC (pT4a, pN1, cM0) - Signed by Colie Dawes, MD on 06/15/2023 Stage prefix: Initial diagnosis   06/27/2023 - 07/06/2023 Radiation Therapy   30 Gy/5 Fx radiation    09/13/2023 -  Chemotherapy   Patient is on Treatment Plan : MELANOMA ADJUVANT Nivolumab (240) q14d      Genetic Testing   Negative CancerNext-Expanded +RNAinsight. The CancerNext-Expanded gene panel offered by [facility] and includes sequencing, rearrangement, and RNA analysis for the following 77 genes: AIP, ALK, APC, ATM, AXIN2, BAP68m BARD1, BMPR1Am BRCA1, BRCA2, BRIP1, CDC73, CDH1, CDK4, CDKN1B, CDKN2A, CEBPA, CHEK2, CTNNA1, DDX41, DICER1, EGFR, EPCAM, ETV6, FH, FLCN, GATA2, GREM1, HOXB13, KIT, LZTR1, MAX, MBD4, MEN1, MET, MITF, MLH1, MSH2, MSH3, MSH6, MUTYH, NF1, NF2, NTHL1, PALB2, PDGFRA, PHOX2B, PMS2, POLD1, POLE, POT1, PRKAR1A, PTCH5m PTEN, RAD51C, RAD51D, RB1, RET, RPS20, RUNX1, SDHA, SDHAF2, SDHB, SDHC, SDHD, SMAD4, SMARCA4, SMARCB1, SMARCE1, STK11, SUFU, TMEM127, TP53, TSC1, TSC2, VHL, WT1. Report date 08/03/23.     Past Medical History:  Diagnosis Date   Hypertension    Stroke [facility])    2013 hemorragic stroke. L side weakness     PHYSICAL EXAMINATION: ECOG PERFORMANCE STATUS: 2 - Symptomatic, <50% confined to bed  Vitals:   08/27/23 0908  BP: 120/77  Pulse: 66  Resp: 17  Temp: 97.7 F (36.5 C)  SpO2: 97%   Filed Weights   08/27/23 0908  Weight: 190 lb 9.6 oz (86.5 kg)   Baseline weakness on the left side from CVA  Relevant data reviewed during this visit included labs, imaging studies.

## 2023-08-27 ENCOUNTER — Inpatient Hospital Stay

## 2023-08-27 ENCOUNTER — Encounter: Payer: Self-pay | Admitting: Radiation Oncology

## 2023-08-27 VITALS — BP 120/77 | HR 66 | Temp 97.7°F | Resp 17 | Ht 69.0 in | Wt 190.6 lb

## 2023-08-27 DIAGNOSIS — C4359 Malignant melanoma of other part of trunk: Secondary | ICD-10-CM | POA: Insufficient documentation

## 2023-08-27 DIAGNOSIS — Z8673 Personal history of transient ischemic attack (TIA), and cerebral infarction without residual deficits: Secondary | ICD-10-CM | POA: Diagnosis not present

## 2023-08-27 NOTE — Assessment & Plan Note (Signed)
 Will plan to start treatment 7/3 or 7/1  Return with each cycle, lab and visit, then infusion

## 2023-08-27 NOTE — Progress Notes (Signed)
 Received call from patient after receiving my card per my request. Introduced myself as Dance movement psychotherapist and to offer available resources. Patient is insured and does not receive qualifying government assistance for [employer] per his response to my question.  He had questions regarding $40 copay he is paying. Asked patient if he had his card available which shows a $40 specialist copay and that providers at [facility] are specialist. He states it was downstairs. Based on most recent card scanned in  Epic, this was confirmed.   Asked if he was familiar with his plan ded/OOP max or plan percentage. He is not. Advised him to contact his insurance company to determine specific details which will help him understand how much he will be expected to pay OOP for his treatment. Also discussed EOB and how it shows these amounts as well. Patient was appreciative and states he is going to get organized and will call me if he needs to.  He has my card to do so and for any additional financial questions or concerns.

## 2023-08-28 ENCOUNTER — Other Ambulatory Visit: Payer: Self-pay

## 2023-08-29 ENCOUNTER — Other Ambulatory Visit: Payer: Self-pay

## 2023-08-29 ENCOUNTER — Encounter: Payer: Self-pay | Admitting: Radiation Oncology

## 2023-08-29 ENCOUNTER — Ambulatory Visit
Admission: RE | Admit: 2023-08-29 | Discharge: 2023-08-29 | Disposition: A | Discharge status: Home / Self Care | Source: Ambulatory Visit | Attending: Radiation Oncology | Admitting: Radiation Oncology

## 2023-08-29 VITALS — BP 112/70 | HR 67 | Temp 98.2°F | Resp 20 | Ht 69.0 in | Wt 187.2 lb

## 2023-08-29 DIAGNOSIS — R9082 White matter disease, unspecified: Secondary | ICD-10-CM | POA: Diagnosis not present

## 2023-08-29 DIAGNOSIS — K7689 Other specified diseases of liver: Secondary | ICD-10-CM | POA: Insufficient documentation

## 2023-08-29 DIAGNOSIS — Z79899 Other long term (current) drug therapy: Secondary | ICD-10-CM | POA: Diagnosis not present

## 2023-08-29 DIAGNOSIS — Z923 Personal history of irradiation: Secondary | ICD-10-CM | POA: Insufficient documentation

## 2023-08-29 DIAGNOSIS — N281 Cyst of kidney, acquired: Secondary | ICD-10-CM | POA: Diagnosis not present

## 2023-08-29 DIAGNOSIS — C4359 Malignant melanoma of other part of trunk: Secondary | ICD-10-CM | POA: Insufficient documentation

## 2023-08-29 NOTE — Progress Notes (Signed)
 Radiation Oncology         [phone] ________________________________  Name: Anthony Hardin MRN: [id number]  Date: 08/29/2023  DOB: 1951/01/04  Follow-Up Visit Note  Outpatient  CC: Jearldine Mina, MD  Jearldine Mina, MD  Diagnosis and Prior Radiotherapy:    ICD-10-CM   1. 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   CHIEF COMPLAINT: Here for follow-up and wondering about PET  Narrative:  The patient returns today for routine follow-up.                                 He saw medical oncology earlier this week.  They reviewed his MRI and PET scan results.  PET scan performed on May 30 showed no evidence of melanoma recurrence.  There was reduction in the volume of the postsurgical collection along the anterior right abdominal wall.  Mild residual metabolic activity in that area was favored to be inflammatory.  Recommendations were made to start systemic therapy in early July.  He has plans to receive nivolumab.  He endorsed memory loss and possible neurology consultation was discussed.  MRI brain on May 30 showed age-related atrophy and moderate cerebral white matter disease with hemosiderin staining within the right thalamus and periventricular white matter from prior intraparenchymal hemorrhage.  No evidence of metastatic disease.   Does the patient complain of any of the following: Chest pains/ SOB: None Headaches/ Dizziness: None Post radiation skin issues None Joint pain/ Swelling: None Motor function issues: None Range of motion limitations: None Fatigue post radiation: None Appetite good/fair/poor: Appetite is good   ALLERGIES:  is allergic to cat dander and scallops [shellfish allergy].  Meds: Current Outpatient Medications  Medication Sig Dispense Refill   amLODipine  (NORVASC ) 10 MG tablet Take 1 tablet (10 mg total) by mouth daily. 30 tablet 1   atorvastatin (LIPITOR) 40 MG  tablet Take 40 mg by mouth daily.     hydrochlorothiazide  (HYDRODIURIL ) 25 MG tablet Take 1 tablet (25 mg total) by mouth daily. 30 tablet 1   lisinopril  (PRINIVIL ,ZESTRIL ) 20 MG tablet Take 1 tablet (20 mg total) by mouth daily. (Patient taking differently: Take 10 mg by mouth 2 (two) times daily.) 30 tablet 1   No current facility-administered medications for this encounter.    Physical Findings: The patient is in no acute distress. Patient is alert and oriented.  height is 5' 9 (1.753 m) and weight is 187 lb 3.2 oz (84.9 kg). His temperature is 98.2 F (36.8 C). His blood pressure is 112/70 and his pulse is 67. His respiration is 20 and oxygen saturation is 96%. .    No sign of recurrence throughout the abdominal wall.  Surgical scar has healed well.  There is still a palpable seroma at the scar.  Mild erythema in the radiation field to the skin  Lab Findings: Lab Results  Component Value Date   WBC 5.9 07/24/2023   HGB 15.8 07/24/2023   HCT 45.0 07/24/2023   MCV 84.6 07/24/2023   PLT 179 07/24/2023    Radiographic Findings: MR Brain W Wo Contrast Result Date: 08/24/2023 CLINICAL DATA:  Staging for melanoma. Patient has history of hypertension and stroke. EXAM: MRI HEAD WITHOUT AND WITH CONTRAST TECHNIQUE: Multiplanar, multiecho pulse sequences of the brain and surrounding structures were obtained without and with intravenous contrast. CONTRAST:  8mL GADAVIST  GADOBUTROL   1 MMOL/ML IV SOLN COMPARISON:  CT of the head dated November 04, 2011 and MRI of the brain dated October 31, 2011. FINDINGS: Brain: There is a moderate amount of diffuse cerebral volume loss, preferentially involving the right parietal lobe. There is moderate periventricular and deep cerebral white matter disease. There is prominent blooming artifact within the right thalamus and periventricular white matter secondary to remote hypertensive bleed. There is no evidence of metastatic disease. There is no abnormal parenchymal  or meningeal enhancement. Vascular: Normal vascular flow voids. Skull and upper cervical spine: Normal bone marrow signal. No lesions are evident. Sinuses/Orbits: Mild mucosal disease within the ethmoid and maxillary sinuses. The orbits and mastoid air cells are unremarkable. Other: None. IMPRESSION: 1. Age-related atrophy and moderate cerebral white matter disease with hemosiderin staining within the right thalamus and periventricular white matter from prior intraparenchymal hemorrhage. 2. No evidence of metastatic disease. Electronically Signed   By: Maribeth Shivers M.D.   On: 08/24/2023 17:14   NM PET Image Restage (PS) Whole Body Result Date: 08/24/2023 CLINICAL DATA:  Subsequent treatment strategy for melanoma. EXAM: NUCLEAR MEDICINE PET WHOLE BODY TECHNIQUE: 9.6 mCi F-18 FDG was injected intravenously. Full-ring PET imaging was performed from the head to foot after the radiotracer. CT data was obtained and used for attenuation correction and anatomic localization. Fasting blood glucose: 126 mg/dl COMPARISON:  FDG PET scan 05/23/2023 FINDINGS: Mediastinal blood pool activity: SUV max HEAD/NECK: No hypermetabolic activity in the scalp. No hypermetabolic cervical lymph nodes. Incidental CT findings: none CHEST: No hypermetabolic mediastinal or hilar nodes. No suspicious pulmonary nodules on the CT scan. Incidental CT findings: Mild linear atelectasis at the RIGHT lung base ABDOMEN/PELVIS: No abnormal hypermetabolic activity within the liver, pancreas, adrenal glands, or spleen. No hypermetabolic lymph nodes in the abdomen or pelvis. Incidental CT findings: Benign hepatic cysts again noted. Multiple benign renal cysts. Reduction in volume of postsurgical fluid collection in the anterior abdominal wall. Collection measures 8.2 by 1.5 cm decreased from 15.9 x 4.0. There is mild radiotracer activity along the margins of the collection similar to prior. SKELETON: No focal hypermetabolic activity to suggest  skeletal metastasis. Incidental CT findings: none EXTREMITIES: No abnormal hypermetabolic activity in the lower extremities. Incidental CT findings: none IMPRESSION: 1. No evidence of melanoma recurrence on whole-body FDG PET scan. 2. Reduction in volume postsurgical collection along the anterior RIGHT abdominal wall. Mild residual metabolic activity is favored inflammatory. Electronically Signed   By: Deboraha Fallow M.D.   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  I reviewed his PET results and images with him.  He is in remission.  He will start nivolumab under the care of medical oncology.  I will see him back as needed.  He is pleased with this plan.  I wished him the very best moving forward.  On date of service, in total, I spent 20 minutes on this encounter. Patient was seen in person.  _____________________________________   Colie Dawes, MD

## 2023-09-03 ENCOUNTER — Other Ambulatory Visit: Payer: Self-pay

## 2023-09-03 ENCOUNTER — Ambulatory Visit

## 2023-09-04 ENCOUNTER — Inpatient Hospital Stay

## 2023-09-04 ENCOUNTER — Telehealth: Payer: Self-pay

## 2023-09-04 NOTE — Telephone Encounter (Signed)
 I informed Anthony Hardin that he will be skipping a cycle while he is out of town and if he has any questions to call us  at [phone].

## 2023-09-05 NOTE — Progress Notes (Signed)
 Pharmacist Chemotherapy Monitoring - Initial Assessment    Anticipated start date: 09/13/23  The following has been reviewed per standard work regarding the patient's treatment regimen: The patient's diagnosis, treatment plan and drug doses, and organ/hematologic function Lab orders and baseline tests specific to treatment regimen  The treatment plan start date, drug sequencing, and pre-medications Prior authorization status  Patient's documented medication list, including drug-drug interaction screen and prescriptions for anti-emetics and supportive care specific to the treatment regimen The drug concentrations, fluid compatibility, administration routes, and timing of the medications to be used The patient's access for treatment and lifetime cumulative dose history, if applicable  The patient's medication allergies and previous infusion related reactions, if applicable   Changes made to treatment plan:  N/A  Follow up needed:  Home anti-emetics   Sharita Bienaime, PharmD, MBA

## 2023-09-13 ENCOUNTER — Inpatient Hospital Stay

## 2023-09-13 VITALS — BP 121/68 | HR 68 | Temp 97.8°F | Resp 18 | Wt 191.5 lb

## 2023-09-13 DIAGNOSIS — C4359 Malignant melanoma of other part of trunk: Secondary | ICD-10-CM | POA: Insufficient documentation

## 2023-09-13 DIAGNOSIS — Z79620 Long term (current) use of immunosuppressive biologic: Secondary | ICD-10-CM | POA: Diagnosis not present

## 2023-09-13 DIAGNOSIS — Z5112 Encounter for antineoplastic immunotherapy: Secondary | ICD-10-CM | POA: Diagnosis not present

## 2023-09-13 LAB — CMP (CANCER CENTER ONLY)
ALT: 23 U/L (ref 0–44)
AST: 20 U/L (ref 15–41)
Albumin: 4.3 g/dL (ref 3.5–5.0)
Alkaline Phosphatase: 48 U/L (ref 38–126)
Anion gap: 8 (ref 5–15)
BUN: 19 mg/dL (ref 8–23)
CO2: 28 mmol/L (ref 22–32)
Calcium: 9.9 mg/dL (ref 8.9–10.3)
Chloride: 103 mmol/L (ref 98–111)
Creatinine: 0.9 mg/dL (ref 0.61–1.24)
GFR, Estimated: >60 mL/min (ref >60)
Glucose, Bld: 131 mg/dL — ABNORMAL HIGH (ref 70–99)
Potassium: 3.5 mmol/L (ref 3.5–5.1)
Sodium: 139 mmol/L (ref 135–145)
Total Bilirubin: 0.7 mg/dL (ref 0.0–1.2)
Total Protein: 7.2 g/dL (ref 6.5–8.1)

## 2023-09-13 LAB — CBC WITH DIFFERENTIAL (CANCER CENTER ONLY)
Abs Immature Granulocytes: 0.01 10*3/uL (ref 0.00–0.07)
Basophils Absolute: 0 10*3/uL (ref 0.0–0.1)
Basophils Relative: 1 %
Eosinophils Absolute: 0.1 10*3/uL (ref 0.0–0.5)
Eosinophils Relative: 1 %
HCT: 42 % (ref 39.0–52.0)
Hemoglobin: 15.2 g/dL (ref 13.0–17.0)
Immature Granulocytes: 0 %
Lymphocytes Relative: 26 %
Lymphs Abs: 1.7 10*3/uL (ref 0.7–4.0)
MCH: 30.5 pg (ref 26.0–34.0)
MCHC: 36.2 g/dL — ABNORMAL HIGH (ref 30.0–36.0)
MCV: 84.3 fL (ref 80.0–100.0)
Monocytes Absolute: 0.7 10*3/uL (ref 0.1–1.0)
Monocytes Relative: 10 %
Neutro Abs: 4 10*3/uL (ref 1.7–7.7)
Neutrophils Relative %: 62 %
Platelet Count: 217 10*3/uL (ref 150–400)
RBC: 4.98 MIL/uL (ref 4.22–5.81)
RDW: 14.6 % (ref 11.5–15.5)
WBC Count: 6.4 10*3/uL (ref 4.0–10.5)
nRBC: 0 % (ref 0.0–0.2)

## 2023-09-13 LAB — TSH: TSH: 1.521 u[IU]/mL (ref 0.350–4.500)

## 2023-09-13 LAB — T4, FREE: Free T4: 0.74 ng/dL (ref 0.61–1.12)

## 2023-09-13 MED ORDER — ONDANSETRON HCL 8 MG PO TABS: 8.0000 mg | ORAL_TABLET | Freq: Three times a day (TID) | ORAL | 1 refills | Status: AC | PRN

## 2023-09-13 MED ORDER — SODIUM CHLORIDE 0.9 % IV SOLN
240.0000 mg | Freq: Once | INTRAVENOUS | Status: AC
  Administered 2023-09-13: 240 mg via INTRAVENOUS
  Filled 2023-09-13: qty 24

## 2023-09-13 MED ORDER — SODIUM CHLORIDE 0.9 % IV SOLN: INTRAVENOUS | Status: DC

## 2023-09-13 MED ORDER — PROCHLORPERAZINE MALEATE 10 MG PO TABS: 10.0000 mg | ORAL_TABLET | Freq: Four times a day (QID) | ORAL | 1 refills | Status: AC | PRN

## 2023-09-13 NOTE — Patient Instructions (Signed)
 [facility] - A DEPT OF [facility]  Discharge Instructions: Thank you for choosing [facility] to provide your oncology and hematology care.   If you have a lab appointment with the Cancer Center, please go directly to the Cancer Center and check in at the registration area.   Wear comfortable clothing and clothing appropriate for easy access to any Portacath or PICC line.   We strive to give you quality time with your provider. You may need to reschedule your appointment if you arrive late (15 or more minutes).  Arriving late affects you and other patients whose appointments are after yours.  Also, if you miss three or more appointments without notifying the office, you may be dismissed from the clinic at the provider's discretion.      For prescription refill requests, have your pharmacy contact our office and allow 72 hours for refills to be completed.    Today you received the following chemotherapy and/or immunotherapy agents: Nivolumab (Opdivo)      To help prevent nausea and vomiting after your treatment, we encourage you to take your nausea medication as directed.  BELOW ARE SYMPTOMS THAT SHOULD BE REPORTED IMMEDIATELY: *FEVER GREATER THAN 100.4 F (38 C) OR HIGHER *CHILLS OR SWEATING *NAUSEA AND VOMITING THAT IS NOT CONTROLLED WITH YOUR NAUSEA MEDICATION *UNUSUAL SHORTNESS OF BREATH *UNUSUAL BRUISING OR BLEEDING *URINARY PROBLEMS (pain or burning when urinating, or frequent urination) *BOWEL PROBLEMS (unusual diarrhea, constipation, pain near the anus) TENDERNESS IN MOUTH AND THROAT WITH OR WITHOUT PRESENCE OF ULCERS (sore throat, sores in mouth, or a toothache) UNUSUAL RASH, SWELLING OR PAIN  UNUSUAL VAGINAL DISCHARGE OR ITCHING   Items with * indicate a potential emergency and should be followed up as soon as possible or go to the Emergency Department if any problems should occur.  Please show the CHEMOTHERAPY ALERT CARD or  IMMUNOTHERAPY ALERT CARD at check-in to the Emergency Department and triage nurse.  Should you have questions after your visit or need to cancel or reschedule your appointment, please contact [facility] - A DEPT OF [facility]  Dept: [phone]  and follow the prompts.  Office hours are 8:00 a.m. to 4:30 p.m. Monday - Friday. Please note that voicemails left after 4:00 p.m. may not be returned until the following business day.  We are closed weekends and major holidays. You have access to a nurse at all times for urgent questions. Please call the main number to the clinic Dept: [phone] and follow the prompts.   For any non-urgent questions, you may also contact your provider using MyChart. We now offer e-Visits for anyone 57 and older to request care online for non-urgent symptoms. For details visit [email or web address].   Also download the MyChart app! Go to the app store, search "MyChart", open the app, select [facility], and log in with your MyChart username and password.  Nivolumab Injection What is this medication? NIVOLUMAB (nye VOL ue mab) treats some types of cancer. It works by helping your immune system slow or stop the spread of cancer cells. It is a monoclonal antibody. This medicine may be used for other purposes; ask your health care provider or pharmacist if you have questions. COMMON BRAND NAME(S): Opdivo What should I tell my care team before I take this medication? They need to know if you have any of these conditions: Allogeneic stem cell transplant (uses someone else's stem cells)  Autoimmune diseases, such as Crohn disease, ulcerative colitis, lupus History of chest radiation Nervous system problems, such as Guillain-Barre syndrome or myasthenia gravis Organ transplant An unusual or allergic reaction to nivolumab, other medications, foods, dyes, or preservatives Pregnant or trying to get pregnant Breast-feeding How should I use  this medication? This medication is infused into a vein. It is given in a hospital or clinic setting. A special MedGuide will be given to you before each treatment. Be sure to read this information carefully each time. Talk to your care team about the use of this medication in children. While it may be prescribed for children as young as 12 years for selected conditions, precautions do apply. Overdosage: If you think you have taken too much of this medicine contact a poison control center or emergency room at once. NOTE: This medicine is only for you. Do not share this medicine with others. What if I miss a dose? Keep appointments for follow-up doses. It is important not to miss your dose. Call your care team if you are unable to keep an appointment. What may interact with this medication? Interactions have not been studied. This list may not describe all possible interactions. Give your health care provider a list of all the medicines, herbs, non-prescription drugs, or dietary supplements you use. Also tell them if you smoke, drink alcohol, or use illegal drugs. Some items may interact with your medicine. What should I watch for while using this medication? Your condition will be monitored carefully while you are receiving this medication. You may need blood work while taking this medication. This medication may cause serious skin reactions. They can happen weeks to months after starting the medication. Contact your care team right away if you notice fevers or flu-like symptoms with a rash. The rash may be red or purple and then turn into blisters or peeling of the skin. You may also notice a red rash with swelling of the face, lips, or lymph nodes in your neck or under your arms. Tell your care team right away if you have any change in your eyesight. Talk to your care team if you are pregnant or think you might be pregnant. A negative pregnancy test is required before starting this medication. A  reliable form of contraception is recommended while taking this medication and for 5 months after the last dose. Talk to your care team about effective forms of contraception. Do not breast-feed while taking this medication and for 5 months after the last dose. What side effects may I notice from receiving this medication? Side effects that you should report to your care team as soon as possible: Allergic reactions--skin rash, itching, hives, swelling of the face, lips, tongue, or throat Dry cough, shortness of breath or trouble breathing Eye pain, redness, irritation, or discharge with blurry or decreased vision Heart muscle inflammation--unusual weakness or fatigue, shortness of breath, chest pain, fast or irregular heartbeat, dizziness, swelling of the ankles, feet, or hands Hormone gland problems--headache, sensitivity to light, unusual weakness or fatigue, dizziness, fast or irregular heartbeat, increased sensitivity to cold or heat, excessive sweating, constipation, hair loss, increased thirst or amount of urine, tremors or shaking, irritability Infusion reactions--chest pain, shortness of breath or trouble breathing, feeling faint or lightheaded Kidney injury (glomerulonephritis)--decrease in the amount of urine, red or dark brown urine, foamy or bubbly urine, swelling of the ankles, hands, or feet Liver injury--right upper belly pain, loss of appetite, nausea, light-colored stool, dark yellow or brown urine, yellowing skin  or eyes, unusual weakness or fatigue Pain, tingling, or numbness in the hands or feet, muscle weakness, change in vision, confusion or trouble speaking, loss of balance or coordination, trouble walking, seizures Rash, fever, and swollen lymph nodes Redness, blistering, peeling, or loosening of the skin, including inside the mouth Sudden or severe stomach pain, bloody diarrhea, fever, nausea, vomiting Side effects that usually do not require medical attention (report these  to your care team if they continue or are bothersome): Bone, joint, or muscle pain Diarrhea Fatigue Loss of appetite Nausea Skin rash This list may not describe all possible side effects. Call your doctor for medical advice about side effects. You may report side effects to FDA at 1-800-FDA-1088. Where should I keep my medication? This medication is given in a hospital or clinic. It will not be stored at home. NOTE: This sheet is a summary. It may not cover all possible information. If you have questions about this medicine, talk to your doctor, pharmacist, or health care provider.  2024 Elsevier/Gold Standard (2021-06-27 00:00:00)

## 2023-09-17 ENCOUNTER — Telehealth: Payer: Self-pay

## 2023-09-17 NOTE — Telephone Encounter (Signed)
 Anthony Hardin states that he is doing fine. He is eating, drinking, and urinating well. He knows to call the office at [phone] if he has any questions or concerns.

## 2023-09-17 NOTE — Telephone Encounter (Signed)
-----   Message from Nurse Ileana HERO sent at 09/13/2023  5:26 PM EDT ----- Regarding: First time Nivolumab . Pt of Chang First time Nivolumab . Pt of Chang. Tolerated infusion well. No adverse s/s. VSS. Please call to check in with patient when possible, thanks!

## 2023-09-20 ENCOUNTER — Encounter: Payer: Self-pay | Admitting: Radiation Oncology

## 2023-09-20 NOTE — Progress Notes (Signed)
 Received voicemail from patient while on PAL regarding bill for Genetic Testing.  Forwarded message to Darice SQUIBB in genetics for follow up.

## 2023-09-28 ENCOUNTER — Other Ambulatory Visit

## 2023-09-28 ENCOUNTER — Telehealth: Payer: Self-pay | Admitting: Genetic Counselor

## 2023-09-28 ENCOUNTER — Ambulatory Visit

## 2023-09-28 NOTE — Telephone Encounter (Signed)
 Patient received [phone] bill for genetic testing, LVM with Darice Monte. Returning call, LVM with billing number for [facility] [phone].

## 2023-10-06 ENCOUNTER — Other Ambulatory Visit: Payer: Self-pay

## 2023-10-08 NOTE — Progress Notes (Unsigned)
 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 Assessment & Plan   No orders of the defined types were placed in this encounter.    Anthony JAYSON Chihuahua, MD  INTERVAL HISTORY: Patient returns for follow-up.  Oncology History  Melanoma of abdominal wall (HCC)  03/2023 Initial Diagnosis   Melanoma of abdominal wall [facility])  He has a birth mark and noted larger last spring and then continued to change in size and color by late summer. He saw dermatology in Jan 2025. It went from smaller than a dime most of his life to about double the size when he saw dermatology.   04/18/2023 Pathology Results   A.   SKIN, RIGHT ABDOMINAL WALL, EXCISION: RESIDUAL MALIGNANT MELANOMA,  4.7 MM PT4 IN EXCISIONAL SPECIMEN, WITH SUPERFICIAL SPREADING AND  ADDITIONAL DESMOPLASTIC COMPONENT, MICROSCOPIC SATELLITE (4.0 X 1.8 MM),  LYMPHOVASCULAR AND PERINEURAL INVASION, MARGINS FREE    05/23/2023 PET scan   PET 1. No findings of active malignancy. 2. Low-grade activity along the margins of a 260 cc fluid collection along the superficial fascia margin of the right anterior abdominal wall, likely a postoperative fluid collection. Favor inflammatory activity over residual/recurrent malignancy. 3. Low-grade activity along the lateral margin of a 2.4 cm right inguinal fluid collection, compatible with lymph node dissection site, likely inflammatory activity rather than neoplastic. 4. Right axillary stranding compatible with recent operative site, with low-grade activity along its margins, likely postinflammatory/post surgical activity. 5.  Aortic Atherosclerosis  (ICD10-I70.0). 6. 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 7. Nonspecific subcutaneous edema in the left calf and dorsal left foot.   06/15/2023 Cancer Staging   Staging form: Melanoma of the Skin, AJCC 8th Edition - Pathologic stage from 06/15/2023: Stage IIIC (pT4a, pN1, cM0) - Signed by Izell Domino, MD on 06/15/2023 Stage prefix: Initial diagnosis   06/27/2023 - 07/06/2023 Radiation Therapy   30 Gy/5 Fx radiation    09/13/2023 -  Chemotherapy   Patient is on Treatment Plan : MELANOMA ADJUVANT Nivolumab  (240) q14d      Genetic Testing   Negative CancerNext-Expanded +RNAinsight. The CancerNext-Expanded gene panel offered by [facility] and includes sequencing, rearrangement, and RNA analysis for the following 77 genes: AIP, ALK, APC, ATM, AXIN2, BAP1m BARD1, BMPR1Am BRCA1, BRCA2, BRIP1, CDC73, CDH1, CDK4, CDKN1B, CDKN2A, CEBPA, CHEK2, CTNNA1, DDX41, DICER1, EGFR, EPCAM, ETV6, FH, FLCN, GATA2, GREM1, HOXB13, KIT, LZTR1, MAX, MBD4, MEN1, MET, MITF, MLH1, MSH2, MSH3, MSH6, MUTYH, NF1, NF2, NTHL1, PALB2, PDGFRA, PHOX2B, PMS2, POLD1, POLE, POT1, PRKAR1A, PTCH1m PTEN, RAD51C, RAD51D, RB1, RET, RPS20, RUNX1, SDHA, SDHAF2, SDHB, SDHC, SDHD, SMAD4, SMARCA4, SMARCB1, SMARCE1, STK11, SUFU, TMEM127, TP53, TSC1, TSC2, VHL, WT1. Report date 08/03/23.       PHYSICAL EXAMINATION: ECOG PERFORMANCE STATUS: {CHL ONC ECOG PS:[id number]}  There were no vitals filed for this visit. There were no vitals filed for this visit.  GENERAL: alert, no distress and comfortable SKIN: skin color normal and no bruising or petechiae or jaundice on exposed skin EYES: normal, sclera clear OROPHARYNX: no exudate  NECK: No palpable mass LYMPH:  no palpable cervical, axillary lymphadenopathy  LUNGS: clear to auscultation and percussion with normal breathing effort HEART: regular rate & rhythm  ABDOMEN:  abdomen soft, non-tender and nondistended. Musculoskeletal: no edema NEURO: no focal motor/sensory  deficits  Relevant data reviewed during this visit included ***

## 2023-10-11 ENCOUNTER — Inpatient Hospital Stay

## 2023-10-11 ENCOUNTER — Inpatient Hospital Stay (HOSPITAL_BASED_OUTPATIENT_CLINIC_OR_DEPARTMENT_OTHER)

## 2023-10-11 ENCOUNTER — Ambulatory Visit

## 2023-10-11 VITALS — BP 113/63 | HR 64 | Temp 97.3°F | Resp 20 | Wt 187.1 lb

## 2023-10-11 DIAGNOSIS — C4359 Malignant melanoma of other part of trunk: Secondary | ICD-10-CM

## 2023-10-11 DIAGNOSIS — Z79620 Long term (current) use of immunosuppressive biologic: Secondary | ICD-10-CM | POA: Diagnosis not present

## 2023-10-11 DIAGNOSIS — Z9189 Other specified personal risk factors, not elsewhere classified: Secondary | ICD-10-CM | POA: Insufficient documentation

## 2023-10-11 DIAGNOSIS — Z5112 Encounter for antineoplastic immunotherapy: Secondary | ICD-10-CM | POA: Diagnosis not present

## 2023-10-11 LAB — CBC WITH DIFFERENTIAL (CANCER CENTER ONLY)
Abs Immature Granulocytes: 0.02 K/uL (ref 0.00–0.07)
Basophils Absolute: 0 K/uL (ref 0.0–0.1)
Basophils Relative: 1 %
Eosinophils Absolute: 0.1 K/uL (ref 0.0–0.5)
Eosinophils Relative: 2 %
HCT: 42 % (ref 39.0–52.0)
Hemoglobin: 15.3 g/dL (ref 13.0–17.0)
Immature Granulocytes: 0 %
Lymphocytes Relative: 22 %
Lymphs Abs: 1.7 K/uL (ref 0.7–4.0)
MCH: 31.5 pg (ref 26.0–34.0)
MCHC: 36.4 g/dL — ABNORMAL HIGH (ref 30.0–36.0)
MCV: 86.4 fL (ref 80.0–100.0)
Monocytes Absolute: 1 K/uL (ref 0.1–1.0)
Monocytes Relative: 13 %
Neutro Abs: 4.7 K/uL (ref 1.7–7.7)
Neutrophils Relative %: 62 %
Platelet Count: 221 K/uL (ref 150–400)
RBC: 4.86 MIL/uL (ref 4.22–5.81)
RDW: 13.9 % (ref 11.5–15.5)
WBC Count: 7.5 K/uL (ref 4.0–10.5)
nRBC: 0 % (ref 0.0–0.2)

## 2023-10-11 LAB — CMP (CANCER CENTER ONLY)
ALT: 26 U/L (ref 0–44)
AST: 23 U/L (ref 15–41)
Albumin: 4.4 g/dL (ref 3.5–5.0)
Alkaline Phosphatase: 49 U/L (ref 38–126)
Anion gap: 7 (ref 5–15)
BUN: 21 mg/dL (ref 8–23)
CO2: 28 mmol/L (ref 22–32)
Calcium: 9.7 mg/dL (ref 8.9–10.3)
Chloride: 101 mmol/L (ref 98–111)
Creatinine: 0.95 mg/dL (ref 0.61–1.24)
GFR, Estimated: >60 mL/min (ref >60)
Glucose, Bld: 107 mg/dL — ABNORMAL HIGH (ref 70–99)
Potassium: 3.6 mmol/L (ref 3.5–5.1)
Sodium: 136 mmol/L (ref 135–145)
Total Bilirubin: 0.7 mg/dL (ref 0.0–1.2)
Total Protein: 7.5 g/dL (ref 6.5–8.1)

## 2023-10-11 LAB — TSH: TSH: 1.6 u[IU]/mL (ref 0.350–4.500)

## 2023-10-11 LAB — T4, FREE: Free T4: 0.94 ng/dL (ref 0.61–1.12)

## 2023-10-11 MED ORDER — SODIUM CHLORIDE 0.9 % IV SOLN
240.0000 mg | Freq: Once | INTRAVENOUS | Status: AC
  Administered 2023-10-11: 240 mg via INTRAVENOUS
  Filled 2023-10-11: qty 24

## 2023-10-11 MED ORDER — SODIUM CHLORIDE 0.9 % IV SOLN: INTRAVENOUS | Status: DC

## 2023-10-11 MED ORDER — SODIUM CHLORIDE 0.9% FLUSH: 10.0000 mL | INTRAVENOUS | Status: DC | PRN

## 2023-10-11 MED ORDER — HEPARIN SOD (PORK) LOCK FLUSH 100 UNIT/ML IV SOLN: 500.0000 [IU] | Freq: Once | INTRAVENOUS | Status: DC | PRN

## 2023-10-11 NOTE — Patient Instructions (Signed)
 [facility] - A DEPT OF [facility]  Discharge Instructions: Thank you for choosing [facility] to provide your oncology and hematology care.   If you have a lab appointment with the Cancer Center, please go directly to the Cancer Center and check in at the registration area.   Wear comfortable clothing and clothing appropriate for easy access to any Portacath or PICC line.   We strive to give you quality time with your provider. You may need to reschedule your appointment if you arrive late (15 or more minutes).  Arriving late affects you and other patients whose appointments are after yours.  Also, if you miss three or more appointments without notifying the office, you may be dismissed from the clinic at the provider's discretion.      For prescription refill requests, have your pharmacy contact our office and allow 72 hours for refills to be completed.    Today you received the following chemotherapy and/or immunotherapy agents: Opdivo      To help prevent nausea and vomiting after your treatment, we encourage you to take your nausea medication as directed.  BELOW ARE SYMPTOMS THAT SHOULD BE REPORTED IMMEDIATELY: *FEVER GREATER THAN 100.4 F (38 C) OR HIGHER *CHILLS OR SWEATING *NAUSEA AND VOMITING THAT IS NOT CONTROLLED WITH YOUR NAUSEA MEDICATION *UNUSUAL SHORTNESS OF BREATH *UNUSUAL BRUISING OR BLEEDING *URINARY PROBLEMS (pain or burning when urinating, or frequent urination) *BOWEL PROBLEMS (unusual diarrhea, constipation, pain near the anus) TENDERNESS IN MOUTH AND THROAT WITH OR WITHOUT PRESENCE OF ULCERS (sore throat, sores in mouth, or a toothache) UNUSUAL RASH, SWELLING OR PAIN  UNUSUAL VAGINAL DISCHARGE OR ITCHING   Items with * indicate a potential emergency and should be followed up as soon as possible or go to the Emergency Department if any problems should occur.  Please show the CHEMOTHERAPY ALERT CARD or IMMUNOTHERAPY  ALERT CARD at check-in to the Emergency Department and triage nurse.  Should you have questions after your visit or need to cancel or reschedule your appointment, please contact [facility] - A DEPT OF [facility]  Dept: [phone]  and follow the prompts.  Office hours are 8:00 a.m. to 4:30 p.m. Monday - Friday. Please note that voicemails left after 4:00 p.m. may not be returned until the following business day.  We are closed weekends and major holidays. You have access to a nurse at all times for urgent questions. Please call the main number to the clinic Dept: [phone] and follow the prompts.   For any non-urgent questions, you may also contact your provider using MyChart. We now offer e-Visits for anyone 57 and older to request care online for non-urgent symptoms. For details visit [email or web address].   Also download the MyChart app! Go to the app store, search "MyChart", open the app, select , and log in with your MyChart username and password.

## 2023-10-11 NOTE — Assessment & Plan Note (Addendum)
 Tolerating treatment well. Return with each cycle, lab and visit, then infusion

## 2023-10-11 NOTE — Assessment & Plan Note (Addendum)
 Monitor for immune related toxicity Monitor thyroid function with labs

## 2023-10-12 ENCOUNTER — Ambulatory Visit

## 2023-10-12 ENCOUNTER — Other Ambulatory Visit

## 2023-10-19 ENCOUNTER — Other Ambulatory Visit: Payer: Self-pay

## 2023-10-24 NOTE — Progress Notes (Signed)
  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   Tolerating 2nd treatment well.  Bilateral arm rash, asymptomatic.  Start hydrocortisone 1% cream twice daily. Assessment & Plan Melanoma of abdominal wall (HCC) Tolerating treatment well.  Will proceed with treatment today Return with each cycle, lab and visit, then infusion Continue to do well, will change to every 4 weeks after end of September. At risk for adverse drug event Monitor for immune related toxicity Monitor thyroid  function with labs Skin rash Scattered lower arm rash bilaterally. Start hydrocortisone 1% cream twice daily until resolved. Advised patient to call and report if worsening    Anthony JAYSON Chihuahua, MD  INTERVAL HISTORY: Patient returns for follow-up.  Feeling well.  He has a few rash over the upper arm, he cannot tell when he developed this.  No itchiness.  Oncology History  Melanoma of abdominal wall (HCC)  03/2023 Initial Diagnosis   Melanoma of abdominal wall [facility])  He has a birth mark and noted larger last spring and then continued to change in size and color by late summer. He saw dermatology in Jan 2025. It went from smaller than a dime most of his life to about double the size when he saw dermatology.   04/18/2023 Pathology Results   A.   SKIN, RIGHT ABDOMINAL WALL, EXCISION: RESIDUAL MALIGNANT MELANOMA,  4.7 MM PT4 IN EXCISIONAL SPECIMEN, WITH SUPERFICIAL SPREADING AND  ADDITIONAL DESMOPLASTIC COMPONENT, MICROSCOPIC SATELLITE (4.0 X 1.8 MM),  LYMPHOVASCULAR AND PERINEURAL INVASION, MARGINS FREE    05/23/2023 PET scan   PET 1. No findings of active  malignancy. 2. Low-grade activity along the margins of a 260 cc fluid collection along the superficial fascia margin of the right anterior abdominal wall, likely a postoperative fluid collection. Favor inflammatory activity over residual/recurrent malignancy. 3. Low-grade activity along the lateral margin of a 2.4 cm right inguinal fluid collection, compatible with lymph node dissection site, likely inflammatory activity rather than neoplastic. 4. Right axillary stranding compatible with recent operative site, with low-grade activity along its margins, likely postinflammatory/post surgical activity. 5.  Aortic Atherosclerosis (ICD10-I70.0). 6. 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 7. Nonspecific subcutaneous edema in the left calf and dorsal left foot.   06/15/2023 Cancer Staging   Staging form: Melanoma of the Skin, AJCC 8th Edition - Pathologic stage from 06/15/2023: Stage IIIC (pT4a, pN1, cM0) - Signed by Izell Domino, MD on 06/15/2023 Stage prefix: Initial diagnosis   06/27/2023 - 07/06/2023 Radiation Therapy   30 Gy/5 Fx radiation    09/13/2023 -  Chemotherapy   Patient is on Treatment Plan : MELANOMA ADJUVANT Nivolumab  (240) q14d      Genetic Testing   Negative CancerNext-Expanded +RNAinsight. The CancerNext-Expanded gene panel offered by [facility] and includes sequencing, rearrangement, and RNA analysis for the following 77 genes: AIP, ALK, APC, ATM, AXIN2, BAP1m BARD1, BMPR1Am BRCA1, BRCA2, BRIP1, CDC73, CDH1, CDK4, CDKN1B, CDKN2A, CEBPA, CHEK2, CTNNA1, DDX41, DICER1, EGFR, EPCAM, ETV6, FH, FLCN, GATA2, GREM1, HOXB13, KIT, LZTR1, MAX, MBD4, MEN1, MET, MITF, MLH1, MSH2, MSH3, MSH6, MUTYH, NF1, NF2, NTHL1, PALB2, PDGFRA, PHOX2B, PMS2, POLD1, POLE, POT1, PRKAR1A, PTCH1m PTEN, RAD51C, RAD51D, RB1, RET, RPS20, RUNX1, SDHA, SDHAF2, SDHB, SDHC, SDHD, SMAD4,  SMARCA4, SMARCB1, SMARCE1, STK11, SUFU, TMEM127, TP53, TSC1, TSC2, VHL, WT1. Report date 08/03/23.       PHYSICAL  EXAMINATION: ECOG PERFORMANCE STATUS: 2 - Symptomatic, <50% confined to bed  Vitals:   10/25/23 1543  BP: 120/88  Pulse: 72  Resp: 19  Temp: 97.7 F (36.5 C)  SpO2: 95%   Filed Weights   10/25/23 1543  Weight: 187 lb 9.6 oz (85.1 kg)    GENERAL: alert, no distress and comfortable SKIN: skin color normal and no jaundice on exposed skin Bilateral lower arm scattered rash.  No bulla NECK: No palpable mass LUNGS: clear to auscultation and percussion with normal breathing effort HEART: regular rate & rhythm  ABDOMEN: abdomen soft, non-tender and nondistended. Musculoskeletal: no edema   Relevant data reviewed during this visit included labs.

## 2023-10-24 NOTE — Assessment & Plan Note (Addendum)
 Tolerating treatment well.  Will proceed with treatment today Return with each cycle, lab and visit, then infusion Continue to do well, will change to every 4 weeks after end of September.

## 2023-10-25 ENCOUNTER — Inpatient Hospital Stay

## 2023-10-25 ENCOUNTER — Ambulatory Visit

## 2023-10-25 ENCOUNTER — Other Ambulatory Visit

## 2023-10-25 ENCOUNTER — Inpatient Hospital Stay (HOSPITAL_BASED_OUTPATIENT_CLINIC_OR_DEPARTMENT_OTHER)

## 2023-10-25 VITALS — BP 120/88 | HR 72 | Temp 97.7°F | Resp 19 | Wt 187.6 lb

## 2023-10-25 DIAGNOSIS — Z5112 Encounter for antineoplastic immunotherapy: Secondary | ICD-10-CM | POA: Diagnosis not present

## 2023-10-25 DIAGNOSIS — C4359 Malignant melanoma of other part of trunk: Secondary | ICD-10-CM

## 2023-10-25 DIAGNOSIS — Z79620 Long term (current) use of immunosuppressive biologic: Secondary | ICD-10-CM | POA: Insufficient documentation

## 2023-10-25 DIAGNOSIS — Z9189 Other specified personal risk factors, not elsewhere classified: Secondary | ICD-10-CM

## 2023-10-25 DIAGNOSIS — R21 Rash and other nonspecific skin eruption: Secondary | ICD-10-CM | POA: Diagnosis not present

## 2023-10-25 LAB — CMP (CANCER CENTER ONLY)
ALT: 30 U/L (ref 0–44)
AST: 25 U/L (ref 15–41)
Albumin: 4.4 g/dL (ref 3.5–5.0)
Alkaline Phosphatase: 48 U/L (ref 38–126)
Anion gap: 11 (ref 5–15)
BUN: 18 mg/dL (ref 8–23)
CO2: 23 mmol/L (ref 22–32)
Calcium: 9.9 mg/dL (ref 8.9–10.3)
Chloride: 104 mmol/L (ref 98–111)
Creatinine: 1.12 mg/dL (ref 0.61–1.24)
GFR, Estimated: >60 mL/min (ref >60)
Glucose, Bld: 110 mg/dL — ABNORMAL HIGH (ref 70–99)
Potassium: 3.6 mmol/L (ref 3.5–5.1)
Sodium: 138 mmol/L (ref 135–145)
Total Bilirubin: 0.9 mg/dL (ref 0.0–1.2)
Total Protein: 7.6 g/dL (ref 6.5–8.1)

## 2023-10-25 LAB — CBC WITH DIFFERENTIAL (CANCER CENTER ONLY)
Abs Immature Granulocytes: 0.02 K/uL (ref 0.00–0.07)
Basophils Absolute: 0.1 K/uL (ref 0.0–0.1)
Basophils Relative: 1 %
Eosinophils Absolute: 0.2 K/uL (ref 0.0–0.5)
Eosinophils Relative: 2 %
HCT: 43.5 % (ref 39.0–52.0)
Hemoglobin: 15.4 g/dL (ref 13.0–17.0)
Immature Granulocytes: 0 %
Lymphocytes Relative: 21 %
Lymphs Abs: 1.5 K/uL (ref 0.7–4.0)
MCH: 31.3 pg (ref 26.0–34.0)
MCHC: 35.4 g/dL (ref 30.0–36.0)
MCV: 88.4 fL (ref 80.0–100.0)
Monocytes Absolute: 0.7 K/uL (ref 0.1–1.0)
Monocytes Relative: 10 %
Neutro Abs: 5 K/uL (ref 1.7–7.7)
Neutrophils Relative %: 66 %
Platelet Count: 214 K/uL (ref 150–400)
RBC: 4.92 MIL/uL (ref 4.22–5.81)
RDW: 13.6 % (ref 11.5–15.5)
WBC Count: 7.5 K/uL (ref 4.0–10.5)
nRBC: 0 % (ref 0.0–0.2)

## 2023-10-25 MED ORDER — SODIUM CHLORIDE 0.9% FLUSH: 10.0000 mL | INTRAVENOUS | Status: DC | PRN

## 2023-10-25 MED ORDER — SODIUM CHLORIDE 0.9 % IV SOLN: INTRAVENOUS | Status: DC

## 2023-10-25 MED ORDER — SODIUM CHLORIDE 0.9 % IV SOLN
240.0000 mg | Freq: Once | INTRAVENOUS | Status: AC
  Administered 2023-10-25: 240 mg via INTRAVENOUS
  Filled 2023-10-25: qty 24

## 2023-10-25 NOTE — Patient Instructions (Signed)
 [facility] - A DEPT OF [facility]  Discharge Instructions: Thank you for choosing [facility] to provide your oncology and hematology care.   If you have a lab appointment with the Cancer Center, please go directly to the Cancer Center and check in at the registration area.   Wear comfortable clothing and clothing appropriate for easy access to any Portacath or PICC line.   We strive to give you quality time with your provider. You may need to reschedule your appointment if you arrive late (15 or more minutes).  Arriving late affects you and other patients whose appointments are after yours.  Also, if you miss three or more appointments without notifying the office, you may be dismissed from the clinic at the provider's discretion.      For prescription refill requests, have your pharmacy contact our office and allow 72 hours for refills to be completed.    Today you received the following chemotherapy and/or immunotherapy agents: Opdivo    To help prevent nausea and vomiting after your treatment, we encourage you to take your nausea medication as directed.  BELOW ARE SYMPTOMS THAT SHOULD BE REPORTED IMMEDIATELY: *FEVER GREATER THAN 100.4 F (38 C) OR HIGHER *CHILLS OR SWEATING *NAUSEA AND VOMITING THAT IS NOT CONTROLLED WITH YOUR NAUSEA MEDICATION *UNUSUAL SHORTNESS OF BREATH *UNUSUAL BRUISING OR BLEEDING *URINARY PROBLEMS (pain or burning when urinating, or frequent urination) *BOWEL PROBLEMS (unusual diarrhea, constipation, pain near the anus) TENDERNESS IN MOUTH AND THROAT WITH OR WITHOUT PRESENCE OF ULCERS (sore throat, sores in mouth, or a toothache) UNUSUAL RASH, SWELLING OR PAIN  UNUSUAL VAGINAL DISCHARGE OR ITCHING   Items with * indicate a potential emergency and should be followed up as soon as possible or go to the Emergency Department if any problems should occur.  Please show the CHEMOTHERAPY ALERT CARD or IMMUNOTHERAPY ALERT  CARD at check-in to the Emergency Department and triage nurse.  Should you have questions after your visit or need to cancel or reschedule your appointment, please contact [facility] - A DEPT OF [facility]  Dept: [phone]  and follow the prompts.  Office hours are 8:00 a.m. to 4:30 p.m. Monday - Friday. Please note that voicemails left after 4:00 p.m. may not be returned until the following business day.  We are closed weekends and major holidays. You have access to a nurse at all times for urgent questions. Please call the main number to the clinic Dept: [phone] and follow the prompts.   For any non-urgent questions, you may also contact your provider using MyChart. We now offer e-Visits for anyone 24 and older to request care online for non-urgent symptoms. For details visit [email or web address].   Also download the MyChart app! Go to the app store, search MyChart, open the app, select [place], and log in with your MyChart username and password.

## 2023-10-25 NOTE — Assessment & Plan Note (Addendum)
 Monitor for immune related toxicity Monitor thyroid function with labs

## 2023-10-25 NOTE — Progress Notes (Signed)
 Pt states he is feeling well. CBC back and within parameters. Okay to release and treat today before CMP results per Dr. Tina.

## 2023-10-25 NOTE — Assessment & Plan Note (Addendum)
 Scattered lower arm rash bilaterally. Start hydrocortisone 1% cream twice daily until resolved. Advised patient to call and report if worsening

## 2023-10-26 NOTE — Progress Notes (Addendum)
 Opdivo  switched from 240mg  q2wk dosing to 480mg  q4wk dosing beginning with C6 on 10/9 per Dr. Tina.  Anthony Hardin, PharmD, MBA

## 2023-11-01 ENCOUNTER — Telehealth: Payer: Self-pay

## 2023-11-01 NOTE — Telephone Encounter (Signed)
 T/C from pt stating he tested positive for Covid today.  He is scheduled for his next treatment on 8/28.  Please advise on rescheduling

## 2023-11-08 ENCOUNTER — Ambulatory Visit

## 2023-11-08 ENCOUNTER — Other Ambulatory Visit

## 2023-11-09 ENCOUNTER — Ambulatory Visit

## 2023-11-09 ENCOUNTER — Other Ambulatory Visit

## 2023-11-21 NOTE — Progress Notes (Unsigned)
 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 Treatment: adjuvant nivolumab . 09/13/23 Assessment & Plan   No orders of the defined types were placed in this encounter.    Anthony JAYSON Chihuahua, MD  INTERVAL HISTORY: Patient returns for follow-up.  Oncology History  Melanoma of abdominal wall (HCC)  03/2023 Initial Diagnosis   Melanoma of abdominal wall [facility])  He has a birth mark and noted larger last spring and then continued to change in size and color by late summer. He saw dermatology in Jan 2025. It went from smaller than a dime most of his life to about double the size when he saw dermatology.   04/18/2023 Pathology Results   A.   SKIN, RIGHT ABDOMINAL WALL, EXCISION: RESIDUAL MALIGNANT MELANOMA,  4.7 MM PT4 IN EXCISIONAL SPECIMEN, WITH SUPERFICIAL SPREADING AND  ADDITIONAL DESMOPLASTIC COMPONENT, MICROSCOPIC SATELLITE (4.0 X 1.8 MM),  LYMPHOVASCULAR AND PERINEURAL INVASION, MARGINS FREE    05/23/2023 PET scan   PET 1. No findings of active malignancy. 2. Low-grade activity along the margins of a 260 cc fluid collection along the superficial fascia margin of the right anterior abdominal wall, likely a postoperative fluid collection. Favor inflammatory activity over residual/recurrent malignancy. 3. Low-grade activity along the lateral margin of a 2.4 cm right inguinal fluid collection, compatible with lymph node dissection site, likely inflammatory activity rather than neoplastic. 4. Right axillary stranding compatible with recent operative site, with low-grade activity along its margins, likely postinflammatory/post surgical activity. 5.   Aortic Atherosclerosis (ICD10-I70.0). 6. 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 7. Nonspecific subcutaneous edema in the left calf and dorsal left foot.   06/15/2023 Cancer Staging   Staging form: Melanoma of the Skin, AJCC 8th Edition - Pathologic stage from 06/15/2023: Stage IIIC (pT4a, pN1, cM0) - Signed by Izell Domino, MD on 06/15/2023 Stage prefix: Initial diagnosis   06/27/2023 - 07/06/2023 Radiation Therapy   30 Gy/5 Fx radiation    09/13/2023 -  Chemotherapy   Patient is on Treatment Plan : MELANOMA ADJUVANT Nivolumab  (240) q14d      Genetic Testing   Negative CancerNext-Expanded +RNAinsight. The CancerNext-Expanded gene panel offered by [facility] and includes sequencing, rearrangement, and RNA analysis for the following 77 genes: AIP, ALK, APC, ATM, AXIN2, BAP1m BARD1, BMPR1Am BRCA1, BRCA2, BRIP1, CDC73, CDH1, CDK4, CDKN1B, CDKN2A, CEBPA, CHEK2, CTNNA1, DDX41, DICER1, EGFR, EPCAM, ETV6, FH, FLCN, GATA2, GREM1, HOXB13, KIT, LZTR1, MAX, MBD4, MEN1, MET, MITF, MLH1, MSH2, MSH3, MSH6, MUTYH, NF1, NF2, NTHL1, PALB2, PDGFRA, PHOX2B, PMS2, POLD1, POLE, POT1, PRKAR1A, PTCH1m PTEN, RAD51C, RAD51D, RB1, RET, RPS20, RUNX1, SDHA, SDHAF2, SDHB, SDHC, SDHD, SMAD4, SMARCA4, SMARCB1, SMARCE1, STK11, SUFU, TMEM127, TP53, TSC1, TSC2, VHL, WT1. Report date 08/03/23.       PHYSICAL EXAMINATION: ECOG PERFORMANCE STATUS: {CHL ONC ECOG PS:[phone]}  There were no vitals filed for this visit. There were no vitals filed for this visit.  GENERAL: alert, no distress and comfortable SKIN: skin color normal and no bruising or petechiae or jaundice on exposed skin EYES: normal, sclera clear OROPHARYNX: no exudate  NECK: No palpable mass LYMPH:  no palpable cervical, axillary lymphadenopathy  LUNGS: clear to auscultation and percussion with normal breathing effort HEART: regular rate &  rhythm  ABDOMEN: abdomen soft, non-tender and nondistended. Musculoskeletal: no edema NEURO: no  focal motor/sensory deficits  Relevant data reviewed during this visit included ***

## 2023-11-22 ENCOUNTER — Inpatient Hospital Stay

## 2023-11-22 VITALS — BP 102/80 | HR 71 | Temp 97.3°F | Resp 20 | Wt 187.2 lb

## 2023-11-22 DIAGNOSIS — C4359 Malignant melanoma of other part of trunk: Secondary | ICD-10-CM

## 2023-11-22 DIAGNOSIS — Z79620 Long term (current) use of immunosuppressive biologic: Secondary | ICD-10-CM | POA: Insufficient documentation

## 2023-11-22 DIAGNOSIS — R21 Rash and other nonspecific skin eruption: Secondary | ICD-10-CM

## 2023-11-22 DIAGNOSIS — Z9189 Other specified personal risk factors, not elsewhere classified: Secondary | ICD-10-CM

## 2023-11-22 DIAGNOSIS — Z5112 Encounter for antineoplastic immunotherapy: Secondary | ICD-10-CM | POA: Diagnosis not present

## 2023-11-22 LAB — CMP (CANCER CENTER ONLY)
ALT: 20 U/L (ref 0–44)
AST: 19 U/L (ref 15–41)
Albumin: 4.6 g/dL (ref 3.5–5.0)
Alkaline Phosphatase: 48 U/L (ref 38–126)
Anion gap: 7 (ref 5–15)
BUN: 16 mg/dL (ref 8–23)
CO2: 28 mmol/L (ref 22–32)
Calcium: 9.8 mg/dL (ref 8.9–10.3)
Chloride: 102 mmol/L (ref 98–111)
Creatinine: 0.95 mg/dL (ref 0.61–1.24)
GFR, Estimated: >60 mL/min (ref >60)
Glucose, Bld: 101 mg/dL — ABNORMAL HIGH (ref 70–99)
Potassium: 3.6 mmol/L (ref 3.5–5.1)
Sodium: 137 mmol/L (ref 135–145)
Total Bilirubin: 0.9 mg/dL (ref 0.0–1.2)
Total Protein: 7.4 g/dL (ref 6.5–8.1)

## 2023-11-22 LAB — CBC WITH DIFFERENTIAL (CANCER CENTER ONLY)
Abs Immature Granulocytes: 0.01 K/uL (ref 0.00–0.07)
Basophils Absolute: 0.1 K/uL (ref 0.0–0.1)
Basophils Relative: 1 %
Eosinophils Absolute: 0.2 K/uL (ref 0.0–0.5)
Eosinophils Relative: 3 %
HCT: 44.8 % (ref 39.0–52.0)
Hemoglobin: 15.9 g/dL (ref 13.0–17.0)
Immature Granulocytes: 0 %
Lymphocytes Relative: 24 %
Lymphs Abs: 1.8 K/uL (ref 0.7–4.0)
MCH: 31 pg (ref 26.0–34.0)
MCHC: 35.5 g/dL (ref 30.0–36.0)
MCV: 87.3 fL (ref 80.0–100.0)
Monocytes Absolute: 0.8 K/uL (ref 0.1–1.0)
Monocytes Relative: 11 %
Neutro Abs: 4.6 K/uL (ref 1.7–7.7)
Neutrophils Relative %: 61 %
Platelet Count: 226 K/uL (ref 150–400)
RBC: 5.13 MIL/uL (ref 4.22–5.81)
RDW: 12.8 % (ref 11.5–15.5)
WBC Count: 7.4 K/uL (ref 4.0–10.5)
nRBC: 0 % (ref 0.0–0.2)

## 2023-11-22 MED ORDER — SODIUM CHLORIDE 0.9 % IV SOLN: INTRAVENOUS | Status: DC

## 2023-11-22 MED ORDER — SODIUM CHLORIDE 0.9 % IV SOLN
240.0000 mg | Freq: Once | INTRAVENOUS | Status: AC
  Administered 2023-11-22: 240 mg via INTRAVENOUS
  Filled 2023-11-22: qty 24

## 2023-11-22 NOTE — Assessment & Plan Note (Addendum)
 Tolerating treatment well.  Will proceed with treatment today Return with each cycle, lab and visit, then infusion Continue at every 4 weeks after end of September.

## 2023-11-22 NOTE — Assessment & Plan Note (Addendum)
 Scattered forearm rash bilaterally. He has not the hydrocortisone cream but only aloe. Recommend start hydrocortisone 1% cream twice daily until resolved. Advised patient to call and report if worsening

## 2023-11-22 NOTE — Assessment & Plan Note (Addendum)
 Monitor for immune related toxicity Monitor thyroid function with labs

## 2023-12-01 ENCOUNTER — Other Ambulatory Visit: Payer: Self-pay

## 2023-12-05 ENCOUNTER — Other Ambulatory Visit: Payer: Self-pay | Admitting: *Deleted

## 2023-12-05 DIAGNOSIS — C4359 Malignant melanoma of other part of trunk: Secondary | ICD-10-CM

## 2023-12-05 NOTE — Assessment & Plan Note (Addendum)
 Monitor for immune related toxicity Monitor thyroid function with labs

## 2023-12-05 NOTE — Assessment & Plan Note (Addendum)
 Tolerating treatment well.  Will proceed with treatment today Return with each cycle, lab and visit, then infusion Continue at every 4 weeks after end of September.

## 2023-12-05 NOTE — Assessment & Plan Note (Addendum)
 Scattered forearm rash bilaterally. He has not the hydrocortisone cream but only aloe. Recommend start hydrocortisone 1% cream twice daily until resolved. Advised patient to call and report if worsening

## 2023-12-05 NOTE — Progress Notes (Unsigned)
 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 Treatment: adjuvant nivolumab . 09/13/23   Bilateral arm rash, asymptomatic.  Recommend him to start hydrocortisone 1% cream twice daily.  Moisturize to 4 times a day. Assessment & Plan Melanoma of abdominal wall (HCC) Tolerating treatment well.  Will proceed with treatment today Return with each cycle, lab and visit, then infusion Continue at every 4 weeks after end of September. Skin rash Scattered forearm rash bilaterally. He has not the hydrocortisone cream but only aloe. Recommend start hydrocortisone 1% cream twice daily until resolved. Advised patient to call and report if worsening At risk for adverse drug event Monitor for immune related toxicity Monitor thyroid  function with labs  No orders of the defined types were placed in this encounter.    [name], MD  INTERVAL HISTORY: Patient returns for follow-up.  Oncology History  Melanoma of abdominal wall (HCC)  03/2023 Initial Diagnosis   Melanoma of abdominal wall [facility])  He has a birth mark and noted larger last spring and then continued to change in size and color by late summer. He saw dermatology in Jan 2025. It went from smaller than a dime most of his life to about double the size when he saw dermatology.   04/18/2023 Pathology Results   A.   SKIN, RIGHT ABDOMINAL WALL, EXCISION: RESIDUAL MALIGNANT MELANOMA,  4.7 MM PT4 IN EXCISIONAL SPECIMEN, WITH SUPERFICIAL SPREADING AND  ADDITIONAL DESMOPLASTIC COMPONENT, MICROSCOPIC SATELLITE (4.0 X 1.8 MM),  LYMPHOVASCULAR AND PERINEURAL INVASION, MARGINS FREE    05/23/2023 PET  scan   PET 1. No findings of active malignancy. 2. Low-grade activity along the margins of a 260 cc fluid collection along the superficial fascia margin of the right anterior abdominal wall, likely a postoperative fluid collection. Favor inflammatory activity over residual/recurrent malignancy. 3. Low-grade activity along the lateral margin of a 2.4 cm right inguinal fluid collection, compatible with lymph node dissection site, likely inflammatory activity rather than neoplastic. 4. Right axillary stranding compatible with recent operative site, with low-grade activity along its margins, likely postinflammatory/post surgical activity. 5.  Aortic Atherosclerosis (ICD10-I70.0). 6. 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 7. Nonspecific subcutaneous edema in the left calf and dorsal left foot.   06/15/2023 Cancer Staging   Staging form: Melanoma of the Skin, AJCC 8th Edition - Pathologic stage from 06/15/2023: Stage IIIC (pT4a, pN1, cM0) - Signed by Izell Domino, MD on 06/15/2023 Stage prefix: Initial diagnosis   06/27/2023 - 07/06/2023 Radiation Therapy   30 Gy/5 Fx radiation    09/13/2023 -  Chemotherapy   Patient is on Treatment Plan : MELANOMA ADJUVANT Nivolumab  (240) q14d      Genetic Testing   Negative CancerNext-Expanded +RNAinsight. The CancerNext-Expanded gene panel offered by [facility] and includes sequencing, rearrangement, and RNA analysis for the following 77 genes: AIP, ALK, APC, ATM, AXIN2, BAP1m BARD1, BMPR1Am BRCA1, BRCA2, BRIP1, CDC73, CDH1, CDK4, CDKN1B, CDKN2A, CEBPA, CHEK2, CTNNA1, DDX41, DICER1, EGFR, EPCAM, ETV6, FH, FLCN, GATA2, GREM1, HOXB13, KIT, LZTR1, MAX, MBD4, MEN1, MET, MITF, MLH1, MSH2, MSH3, MSH6, MUTYH, NF1, NF2, NTHL1, PALB2, PDGFRA, PHOX2B, PMS2, POLD1, POLE, POT1, PRKAR1A, PTCH1m PTEN, RAD51C, RAD51D, RB1, RET, RPS20, RUNX1, SDHA, SDHAF2, SDHB, SDHC,  SDHD, SMAD4, SMARCA4, SMARCB1, SMARCE1, STK11, SUFU, TMEM127, TP53, TSC1, TSC2, VHL, WT1.  Report date 08/03/23.       PHYSICAL EXAMINATION: ECOG PERFORMANCE STATUS: {CHL ONC ECOG PS:[id number]}  There were no vitals filed for this visit. There were no vitals filed for this visit.  GENERAL: alert, no distress and comfortable SKIN: skin color normal and no jaundice or bruising or petechiae on exposed skin EYES: normal, sclera clear OROPHARYNX: no exudate  NECK: No palpable mass LYMPH:  no palpable cervical, axillary lymphadenopathy  LUNGS: clear to auscultation and no wheeze or rales with normal breathing effort HEART: regular rate & rhythm  ABDOMEN: abdomen soft, non-tender and nondistended. Musculoskeletal: no edema NEURO: no focal motor/sensory deficits  Relevant data reviewed during this visit included labs.  New labs ordered.

## 2023-12-06 ENCOUNTER — Inpatient Hospital Stay

## 2023-12-06 VITALS — BP 128/74 | HR 66 | Temp 97.7°F | Resp 13 | Wt 190.2 lb

## 2023-12-06 DIAGNOSIS — Z9189 Other specified personal risk factors, not elsewhere classified: Secondary | ICD-10-CM | POA: Diagnosis not present

## 2023-12-06 DIAGNOSIS — R21 Rash and other nonspecific skin eruption: Secondary | ICD-10-CM

## 2023-12-06 DIAGNOSIS — Z5112 Encounter for antineoplastic immunotherapy: Secondary | ICD-10-CM | POA: Diagnosis not present

## 2023-12-06 DIAGNOSIS — C4359 Malignant melanoma of other part of trunk: Secondary | ICD-10-CM | POA: Diagnosis not present

## 2023-12-06 DIAGNOSIS — Z79620 Long term (current) use of immunosuppressive biologic: Secondary | ICD-10-CM | POA: Diagnosis not present

## 2023-12-06 LAB — CMP (CANCER CENTER ONLY)
ALT: 23 U/L (ref 0–44)
AST: 18 U/L (ref 15–41)
Albumin: 4.5 g/dL (ref 3.5–5.0)
Alkaline Phosphatase: 51 U/L (ref 38–126)
Anion gap: 7 (ref 5–15)
BUN: 21 mg/dL (ref 8–23)
CO2: 28 mmol/L (ref 22–32)
Calcium: 10 mg/dL (ref 8.9–10.3)
Chloride: 103 mmol/L (ref 98–111)
Creatinine: 1.03 mg/dL (ref 0.61–1.24)
GFR, Estimated: >60 mL/min (ref >60)
Glucose, Bld: 123 mg/dL — ABNORMAL HIGH (ref 70–99)
Potassium: 3.7 mmol/L (ref 3.5–5.1)
Sodium: 138 mmol/L (ref 135–145)
Total Bilirubin: 0.6 mg/dL (ref 0.0–1.2)
Total Protein: 7.3 g/dL (ref 6.5–8.1)

## 2023-12-06 LAB — CBC WITH DIFFERENTIAL (CANCER CENTER ONLY)
Abs Immature Granulocytes: 0.03 K/uL (ref 0.00–0.07)
Basophils Absolute: 0.1 K/uL (ref 0.0–0.1)
Basophils Relative: 1 %
Eosinophils Absolute: 0.2 K/uL (ref 0.0–0.5)
Eosinophils Relative: 2 %
HCT: 44.9 % (ref 39.0–52.0)
Hemoglobin: 15.8 g/dL (ref 13.0–17.0)
Immature Granulocytes: 0 %
Lymphocytes Relative: 20 %
Lymphs Abs: 1.8 K/uL (ref 0.7–4.0)
MCH: 30.7 pg (ref 26.0–34.0)
MCHC: 35.2 g/dL (ref 30.0–36.0)
MCV: 87.4 fL (ref 80.0–100.0)
Monocytes Absolute: 0.7 K/uL (ref 0.1–1.0)
Monocytes Relative: 8 %
Neutro Abs: 6.1 K/uL (ref 1.7–7.7)
Neutrophils Relative %: 69 %
Platelet Count: 229 K/uL (ref 150–400)
RBC: 5.14 MIL/uL (ref 4.22–5.81)
RDW: 12.7 % (ref 11.5–15.5)
WBC Count: 8.9 K/uL (ref 4.0–10.5)
nRBC: 0 % (ref 0.0–0.2)

## 2023-12-06 LAB — T4, FREE: Free T4: 0.93 ng/dL (ref 0.61–1.12)

## 2023-12-06 MED ORDER — SODIUM CHLORIDE 0.9 % IV SOLN: INTRAVENOUS | Status: DC

## 2023-12-06 MED ORDER — SODIUM CHLORIDE 0.9 % IV SOLN
240.0000 mg | Freq: Once | INTRAVENOUS | Status: AC
  Administered 2023-12-06: 240 mg via INTRAVENOUS
  Filled 2023-12-06: qty 24

## 2023-12-06 NOTE — Patient Instructions (Signed)
 [facility] - A DEPT OF [facility]   Discharge Instructions: Thank you for choosing [facility] to provide your oncology and hematology care.   If you have a lab appointment with the Cancer Center, please go directly to the Cancer Center and check in at the registration area.   Wear comfortable clothing and clothing appropriate for easy access to any Portacath or PICC line.   We strive to give you quality time with your provider. You may need to reschedule your appointment if you arrive late (15 or more minutes).  Arriving late affects you and other patients whose appointments are after yours.  Also, if you miss three or more appointments without notifying the office, you may be dismissed from the clinic at the provider's discretion.      For prescription refill requests, have your pharmacy contact our office and allow 72 hours for refills to be completed.    Today you received the following chemotherapy and/or immunotherapy agents: Nivolumab (Opdivo)      To help prevent nausea and vomiting after your treatment, we encourage you to take your nausea medication as directed.  BELOW ARE SYMPTOMS THAT SHOULD BE REPORTED IMMEDIATELY: *FEVER GREATER THAN 100.4 F (38 C) OR HIGHER *CHILLS OR SWEATING *NAUSEA AND VOMITING THAT IS NOT CONTROLLED WITH YOUR NAUSEA MEDICATION *UNUSUAL SHORTNESS OF BREATH *UNUSUAL BRUISING OR BLEEDING *URINARY PROBLEMS (pain or burning when urinating, or frequent urination) *BOWEL PROBLEMS (unusual diarrhea, constipation, pain near the anus) TENDERNESS IN MOUTH AND THROAT WITH OR WITHOUT PRESENCE OF ULCERS (sore throat, sores in mouth, or a toothache) UNUSUAL RASH, SWELLING OR PAIN  UNUSUAL VAGINAL DISCHARGE OR ITCHING   Items with * indicate a potential emergency and should be followed up as soon as possible or go to the Emergency Department if any problems should occur.  Please show the CHEMOTHERAPY ALERT CARD or  IMMUNOTHERAPY ALERT CARD at check-in to the Emergency Department and triage nurse.  Should you have questions after your visit or need to cancel or reschedule your appointment, please contact [facility] - A DEPT OF [facility]  Dept: [phone]  and follow the prompts.  Office hours are 8:00 a.m. to 4:30 p.m. Monday - Friday. Please note that voicemails left after 4:00 p.m. may not be returned until the following business day.  We are closed weekends and major holidays. You have access to a nurse at all times for urgent questions. Please call the main number to the clinic Dept: [phone] and follow the prompts.   For any non-urgent questions, you may also contact your provider using MyChart. We now offer e-Visits for anyone 4 and older to request care online for non-urgent symptoms. For details visit [email or web address].   Also download the MyChart app! Go to the app store, search "MyChart", open the app, select [place], and log in with your MyChart username and password.

## 2023-12-07 ENCOUNTER — Other Ambulatory Visit: Payer: Self-pay

## 2023-12-07 DIAGNOSIS — C4359 Malignant melanoma of other part of trunk: Secondary | ICD-10-CM

## 2023-12-07 LAB — TSH: TSH: 1.69 u[IU]/mL (ref 0.350–4.500)

## 2023-12-07 NOTE — Progress Notes (Signed)
 Given patient's development of rash, will remain on 2 week schedule for nivolumab . Plan on resume on 10/23 if rash not worsening.

## 2023-12-09 ENCOUNTER — Other Ambulatory Visit: Payer: Self-pay

## 2023-12-13 ENCOUNTER — Ambulatory Visit (HOSPITAL_COMMUNITY)
Admission: RE | Admit: 2023-12-13 | Discharge: 2023-12-13 | Disposition: A | Discharge status: Home / Self Care | Source: Ambulatory Visit

## 2023-12-13 DIAGNOSIS — L270 Generalized skin eruption due to drugs and medicaments taken internally: Secondary | ICD-10-CM | POA: Diagnosis not present

## 2023-12-13 DIAGNOSIS — C4359 Malignant melanoma of other part of trunk: Secondary | ICD-10-CM | POA: Diagnosis not present

## 2023-12-13 DIAGNOSIS — L821 Other seborrheic keratosis: Secondary | ICD-10-CM | POA: Diagnosis not present

## 2023-12-13 DIAGNOSIS — Z08 Encounter for follow-up examination after completed treatment for malignant neoplasm: Secondary | ICD-10-CM | POA: Diagnosis not present

## 2023-12-13 DIAGNOSIS — L570 Actinic keratosis: Secondary | ICD-10-CM | POA: Diagnosis not present

## 2023-12-13 DIAGNOSIS — D1801 Hemangioma of skin and subcutaneous tissue: Secondary | ICD-10-CM | POA: Diagnosis not present

## 2023-12-13 DIAGNOSIS — Z85820 Personal history of malignant melanoma of skin: Secondary | ICD-10-CM | POA: Diagnosis not present

## 2023-12-13 DIAGNOSIS — R590 Localized enlarged lymph nodes: Secondary | ICD-10-CM | POA: Diagnosis not present

## 2023-12-13 DIAGNOSIS — L814 Other melanin hyperpigmentation: Secondary | ICD-10-CM | POA: Diagnosis not present

## 2023-12-13 LAB — GLUCOSE, CAPILLARY: Glucose-Capillary: 120 mg/dL — ABNORMAL HIGH (ref 70–99)

## 2023-12-13 MED ORDER — FLUDEOXYGLUCOSE F - 18 (FDG) INJECTION
9.4800 | Freq: Once | INTRAVENOUS | Status: AC
Start: 2023-12-13 — End: 2023-12-13
  Administered 2023-12-13: 9.48 via INTRAVENOUS

## 2023-12-14 ENCOUNTER — Ambulatory Visit: Payer: Self-pay

## 2024-01-02 NOTE — Assessment & Plan Note (Addendum)
 Scattered forearm rash bilaterally. Continue 1% hydrocortisone cream. Advised patient to call and report if worsening

## 2024-01-02 NOTE — Assessment & Plan Note (Addendum)
 Will resume treatment today Return with each cycle, lab and visit, then infusion

## 2024-01-02 NOTE — Assessment & Plan Note (Addendum)
 Monitor for immune related toxicity Monitor thyroid function with labs

## 2024-01-02 NOTE — Progress Notes (Unsigned)
 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 Treatment: adjuvant nivolumab . 09/13/23   Bilateral arm rash, and treatment was held last time.  Recommend him to continue hydrocortisone 1% cream twice daily.  Moisturize to 4 times a day.  Assessment & Plan Melanoma of abdominal wall (HCC) Will resume treatment today Return with each cycle, lab and visit, then infusion Skin rash Scattered forearm rash bilaterally. Continue 1% hydrocortisone cream. Advised patient to call and report if worsening At risk for adverse drug event Monitor for immune related toxicity Monitor thyroid  function with labs   Anthony JAYSON Chihuahua, MD  INTERVAL HISTORY: Patient returns for follow-up. Overall feeling well. Rash improved on steroid ointment. No Hardin new symptoms.  Oncology History  Melanoma of abdominal wall (HCC)  03/2023 Initial Diagnosis   Melanoma of abdominal wall [facility])  He has a birth mark and noted larger last spring and then continued to change in size and color by late summer. He saw dermatology in Jan 2025. It went from smaller than a dime most of his life to about double the size when he saw dermatology.   04/18/2023 Pathology Results   A.   SKIN, RIGHT ABDOMINAL WALL, EXCISION: RESIDUAL MALIGNANT MELANOMA,  4.7 MM PT4 IN EXCISIONAL SPECIMEN, WITH SUPERFICIAL SPREADING AND  ADDITIONAL DESMOPLASTIC COMPONENT, MICROSCOPIC SATELLITE (4.0 X 1.8 MM),  LYMPHOVASCULAR AND PERINEURAL INVASION, MARGINS FREE    05/23/2023 PET scan   PET 1. No findings of active malignancy. 2. Low-grade activity along the margins of a  260 cc fluid collection along the superficial fascia margin of the right anterior abdominal wall, likely a postoperative fluid collection. Favor inflammatory activity over residual/recurrent malignancy. 3. Low-grade activity along the lateral margin of a 2.4 cm right inguinal fluid collection, compatible with lymph node dissection site, likely inflammatory activity rather than neoplastic. 4. Right axillary stranding compatible with recent operative site, with low-grade activity along its margins, likely postinflammatory/post surgical activity. 5.  Aortic Atherosclerosis (ICD10-I70.0). 6. 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 7. Nonspecific subcutaneous edema in the left calf and dorsal left foot.   06/15/2023 Cancer Staging   Staging form: Melanoma of the Skin, AJCC 8th Edition - Pathologic stage from 06/15/2023: Stage IIIC (pT4a, pN1, cM0) - Signed by Anthony Domino, MD on 06/15/2023 Stage prefix: Initial diagnosis   06/27/2023 - 07/06/2023 Radiation Therapy   30 Gy/5 Fx radiation    09/13/2023 -  Chemotherapy   Patient is on Treatment Plan : MELANOMA ADJUVANT Nivolumab  (240) q14d      Genetic Testing   Negative CancerNext-Expanded +RNAinsight. The CancerNext-Expanded gene panel offered by [facility] and includes sequencing, rearrangement, and RNA analysis for the following 77 genes: AIP, ALK, APC, ATM, AXIN2, BAP1m BARD1, BMPR1Am BRCA1, BRCA2, BRIP1, CDC73, CDH1, CDK4, CDKN1B, CDKN2A, CEBPA, CHEK2, CTNNA1, DDX41, DICER1, EGFR, EPCAM, ETV6, FH, FLCN, GATA2, GREM1, HOXB13, KIT, LZTR1, MAX, MBD4, MEN1, MET, MITF, MLH1, MSH2, MSH3, MSH6, MUTYH, NF1, NF2, NTHL1, PALB2, PDGFRA, PHOX2B, PMS2, POLD1, POLE, POT1, PRKAR1A, PTCH1m PTEN, RAD51C, RAD51D, RB1, RET, RPS20, RUNX1, SDHA, SDHAF2, SDHB, SDHC, SDHD, SMAD4, SMARCA4, SMARCB1, SMARCE1, STK11, SUFU, TMEM127, TP53, TSC1, TSC2, VHL, WT1. Report date 08/03/23.  PHYSICAL EXAMINATION: ECOG PERFORMANCE STATUS: 2 - Symptomatic,  <50% confined to bed  Vitals:   01/03/24 1441  BP: 115/64  Pulse: 71  Resp: 18  Temp: 97.9 F (36.6 C)  SpO2: 94%   Filed Weights   01/03/24 1441  Weight: 192 lb (87.1 kg)    GENERAL: alert, no distress and comfortable SKIN: skin color normal and no jaundice or bruising or petechiae on exposed skin Few scattered rashes over arms LUNGS: clear to auscultation and no wheeze or rales with normal breathing effort HEART: regular rate & rhythm  ABDOMEN: abdomen soft, non-tender and nondistended.   Relevant data reviewed during this visit included labs.

## 2024-01-03 ENCOUNTER — Inpatient Hospital Stay

## 2024-01-03 VITALS — BP 147/74 | HR 77 | Temp 97.6°F | Resp 18

## 2024-01-03 VITALS — BP 115/64 | HR 71 | Temp 97.9°F | Resp 18 | Ht 69.0 in | Wt 192.0 lb

## 2024-01-03 DIAGNOSIS — C4359 Malignant melanoma of other part of trunk: Secondary | ICD-10-CM | POA: Diagnosis present

## 2024-01-03 DIAGNOSIS — R21 Rash and other nonspecific skin eruption: Secondary | ICD-10-CM | POA: Diagnosis not present

## 2024-01-03 DIAGNOSIS — Z5112 Encounter for antineoplastic immunotherapy: Secondary | ICD-10-CM | POA: Diagnosis present

## 2024-01-03 DIAGNOSIS — Z9189 Other specified personal risk factors, not elsewhere classified: Secondary | ICD-10-CM | POA: Diagnosis not present

## 2024-01-03 DIAGNOSIS — Z79620 Long term (current) use of immunosuppressive biologic: Secondary | ICD-10-CM | POA: Diagnosis not present

## 2024-01-03 LAB — CMP (CANCER CENTER ONLY)
ALT: 29 U/L (ref 0–44)
AST: 23 U/L (ref 15–41)
Albumin: 4.4 g/dL (ref 3.5–5.0)
Alkaline Phosphatase: 49 U/L (ref 38–126)
Anion gap: 7 (ref 5–15)
BUN: 20 mg/dL (ref 8–23)
CO2: 28 mmol/L (ref 22–32)
Calcium: 9.9 mg/dL (ref 8.9–10.3)
Chloride: 104 mmol/L (ref 98–111)
Creatinine: 0.98 mg/dL (ref 0.61–1.24)
GFR, Estimated: >60 mL/min (ref >60)
Glucose, Bld: 92 mg/dL (ref 70–99)
Potassium: 3.7 mmol/L (ref 3.5–5.1)
Sodium: 139 mmol/L (ref 135–145)
Total Bilirubin: 0.9 mg/dL (ref 0.0–1.2)
Total Protein: 7.4 g/dL (ref 6.5–8.1)

## 2024-01-03 LAB — CBC WITH DIFFERENTIAL (CANCER CENTER ONLY)
Abs Immature Granulocytes: 0.01 K/uL (ref 0.00–0.07)
Basophils Absolute: 0.1 K/uL (ref 0.0–0.1)
Basophils Relative: 1 %
Eosinophils Absolute: 0.2 K/uL (ref 0.0–0.5)
Eosinophils Relative: 2 %
HCT: 43.4 % (ref 39.0–52.0)
Hemoglobin: 15.6 g/dL (ref 13.0–17.0)
Immature Granulocytes: 0 %
Lymphocytes Relative: 18 %
Lymphs Abs: 1.5 K/uL (ref 0.7–4.0)
MCH: 31 pg (ref 26.0–34.0)
MCHC: 35.9 g/dL (ref 30.0–36.0)
MCV: 86.1 fL (ref 80.0–100.0)
Monocytes Absolute: 0.8 K/uL (ref 0.1–1.0)
Monocytes Relative: 9 %
Neutro Abs: 5.7 K/uL (ref 1.7–7.7)
Neutrophils Relative %: 70 %
Platelet Count: 223 K/uL (ref 150–400)
RBC: 5.04 MIL/uL (ref 4.22–5.81)
RDW: 13 % (ref 11.5–15.5)
WBC Count: 8.2 K/uL (ref 4.0–10.5)
nRBC: 0 % (ref 0.0–0.2)

## 2024-01-03 LAB — T4, FREE: Free T4: 0.86 ng/dL (ref 0.61–1.12)

## 2024-01-03 LAB — TSH: TSH: 1.34 u[IU]/mL (ref 0.350–4.500)

## 2024-01-03 MED ORDER — SODIUM CHLORIDE 0.9 % IV SOLN: INTRAVENOUS | Status: DC

## 2024-01-03 MED ORDER — SODIUM CHLORIDE 0.9 % IV SOLN
240.0000 mg | Freq: Once | INTRAVENOUS | Status: AC
  Administered 2024-01-03: 240 mg via INTRAVENOUS
  Filled 2024-01-03: qty 24

## 2024-01-03 NOTE — Patient Instructions (Signed)
 [facility] - A DEPT OF [facility]   Discharge Instructions: Thank you for choosing [facility] to provide your oncology and hematology care.   If you have a lab appointment with the Cancer Center, please go directly to the Cancer Center and check in at the registration area.   Wear comfortable clothing and clothing appropriate for easy access to any Portacath or PICC line.   We strive to give you quality time with your provider. You may need to reschedule your appointment if you arrive late (15 or more minutes).  Arriving late affects you and other patients whose appointments are after yours.  Also, if you miss three or more appointments without notifying the office, you may be dismissed from the clinic at the provider's discretion.      For prescription refill requests, have your pharmacy contact our office and allow 72 hours for refills to be completed.    Today you received the following chemotherapy and/or immunotherapy agents: Nivolumab (Opdivo)      To help prevent nausea and vomiting after your treatment, we encourage you to take your nausea medication as directed.  BELOW ARE SYMPTOMS THAT SHOULD BE REPORTED IMMEDIATELY: *FEVER GREATER THAN 100.4 F (38 C) OR HIGHER *CHILLS OR SWEATING *NAUSEA AND VOMITING THAT IS NOT CONTROLLED WITH YOUR NAUSEA MEDICATION *UNUSUAL SHORTNESS OF BREATH *UNUSUAL BRUISING OR BLEEDING *URINARY PROBLEMS (pain or burning when urinating, or frequent urination) *BOWEL PROBLEMS (unusual diarrhea, constipation, pain near the anus) TENDERNESS IN MOUTH AND THROAT WITH OR WITHOUT PRESENCE OF ULCERS (sore throat, sores in mouth, or a toothache) UNUSUAL RASH, SWELLING OR PAIN  UNUSUAL VAGINAL DISCHARGE OR ITCHING   Items with * indicate a potential emergency and should be followed up as soon as possible or go to the Emergency Department if any problems should occur.  Please show the CHEMOTHERAPY ALERT CARD or  IMMUNOTHERAPY ALERT CARD at check-in to the Emergency Department and triage nurse.  Should you have questions after your visit or need to cancel or reschedule your appointment, please contact [facility] - A DEPT OF [facility]  Dept: [phone]  and follow the prompts.  Office hours are 8:00 a.m. to 4:30 p.m. Monday - Friday. Please note that voicemails left after 4:00 p.m. may not be returned until the following business day.  We are closed weekends and major holidays. You have access to a nurse at all times for urgent questions. Please call the main number to the clinic Dept: [phone] and follow the prompts.   For any non-urgent questions, you may also contact your provider using MyChart. We now offer e-Visits for anyone 4 and older to request care online for non-urgent symptoms. For details visit [email or web address].   Also download the MyChart app! Go to the app store, search "MyChart", open the app, select [place], and log in with your MyChart username and password.

## 2024-01-17 ENCOUNTER — Inpatient Hospital Stay

## 2024-01-17 VITALS — BP 107/73 | HR 62 | Temp 97.3°F | Resp 16 | Wt 195.8 lb

## 2024-01-17 DIAGNOSIS — R21 Rash and other nonspecific skin eruption: Secondary | ICD-10-CM

## 2024-01-17 DIAGNOSIS — Z79620 Long term (current) use of immunosuppressive biologic: Secondary | ICD-10-CM | POA: Insufficient documentation

## 2024-01-17 DIAGNOSIS — C4359 Malignant melanoma of other part of trunk: Secondary | ICD-10-CM | POA: Insufficient documentation

## 2024-01-17 DIAGNOSIS — Z8673 Personal history of transient ischemic attack (TIA), and cerebral infarction without residual deficits: Secondary | ICD-10-CM | POA: Diagnosis not present

## 2024-01-17 DIAGNOSIS — Z79899 Other long term (current) drug therapy: Secondary | ICD-10-CM | POA: Diagnosis not present

## 2024-01-17 DIAGNOSIS — Z5112 Encounter for antineoplastic immunotherapy: Secondary | ICD-10-CM | POA: Diagnosis present

## 2024-01-17 DIAGNOSIS — Z9189 Other specified personal risk factors, not elsewhere classified: Secondary | ICD-10-CM

## 2024-01-17 LAB — CBC WITH DIFFERENTIAL (CANCER CENTER ONLY)
Abs Immature Granulocytes: 0.01 K/uL (ref 0.00–0.07)
Basophils Absolute: 0 K/uL (ref 0.0–0.1)
Basophils Relative: 1 %
Eosinophils Absolute: 0.2 K/uL (ref 0.0–0.5)
Eosinophils Relative: 4 %
HCT: 45.2 % (ref 39.0–52.0)
Hemoglobin: 15.8 g/dL (ref 13.0–17.0)
Immature Granulocytes: 0 %
Lymphocytes Relative: 26 %
Lymphs Abs: 1.8 K/uL (ref 0.7–4.0)
MCH: 30.4 pg (ref 26.0–34.0)
MCHC: 35 g/dL (ref 30.0–36.0)
MCV: 86.9 fL (ref 80.0–100.0)
Monocytes Absolute: 0.8 K/uL (ref 0.1–1.0)
Monocytes Relative: 11 %
Neutro Abs: 4 K/uL (ref 1.7–7.7)
Neutrophils Relative %: 58 %
Platelet Count: 216 K/uL (ref 150–400)
RBC: 5.2 MIL/uL (ref 4.22–5.81)
RDW: 13.1 % (ref 11.5–15.5)
WBC Count: 6.8 K/uL (ref 4.0–10.5)
nRBC: 0 % (ref 0.0–0.2)

## 2024-01-17 LAB — CMP (CANCER CENTER ONLY)
ALT: 26 U/L (ref 0–44)
AST: 25 U/L (ref 15–41)
Albumin: 4.4 g/dL (ref 3.5–5.0)
Alkaline Phosphatase: 47 U/L (ref 38–126)
Anion gap: 8 (ref 5–15)
BUN: 20 mg/dL (ref 8–23)
CO2: 25 mmol/L (ref 22–32)
Calcium: 9.6 mg/dL (ref 8.9–10.3)
Chloride: 103 mmol/L (ref 98–111)
Creatinine: 0.91 mg/dL (ref 0.61–1.24)
GFR, Estimated: >60 mL/min (ref >60)
Glucose, Bld: 116 mg/dL — ABNORMAL HIGH (ref 70–99)
Potassium: 3.8 mmol/L (ref 3.5–5.1)
Sodium: 136 mmol/L (ref 135–145)
Total Bilirubin: 1 mg/dL (ref 0.0–1.2)
Total Protein: 7.4 g/dL (ref 6.5–8.1)

## 2024-01-17 LAB — TSH: TSH: 1.75 u[IU]/mL (ref 0.350–4.500)

## 2024-01-17 LAB — T4, FREE: Free T4: 0.73 ng/dL (ref 0.61–1.12)

## 2024-01-17 MED ORDER — TRIAMCINOLONE ACETONIDE 0.5 % EX OINT: 1.0000 | TOPICAL_OINTMENT | Freq: Two times a day (BID) | CUTANEOUS | 0 refills | Status: DC

## 2024-01-17 NOTE — Assessment & Plan Note (Addendum)
 Monitor for immune related toxicity Monitor thyroid function with labs

## 2024-01-17 NOTE — Progress Notes (Signed)
 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 Treatment: adjuvant nivolumab . 09/13/23   Rash now in the neck. Will postpone treatment for 2 weeks and start Kenalog ointment twice daily. Assessment & Plan Melanoma of abdominal wall (HCC) Will continue treatment today Return with each cycle, lab and visit, then infusion Skin rash Scattered forearm rash bilaterally. Prescribed Kenalog 0.5% twice daily. Advised patient to call and report if worsening At risk for adverse drug event Monitor for immune related toxicity Monitor thyroid  function with labs  Orders Placed This Encounter  Procedures   CBC with Differential (Cancer Center Only)    Standing Status:   Future    Expected Date:   02/28/2024    Expiration Date:   02/27/2025   CMP (Cancer Center only)    Standing Status:   Future    Expected Date:   02/28/2024    Expiration Date:   02/27/2025   T4, free    Standing Status:   Future    Expected Date:   02/28/2024    Expiration Date:   02/27/2025   TSH    Standing Status:   Future    Expected Date:   02/28/2024    Expiration Date:   02/27/2025   CBC with Differential (Cancer Center Only)    Standing Status:   Future    Expected Date:   03/20/2024    Expiration Date:   03/20/2025   CMP (Cancer Center only)    Standing Status:   Future    Expected Date:   03/20/2024    Expiration Date:   03/20/2025   T4, free    Standing Status:   Future    Expected Date:   03/20/2024    Expiration Date:   03/20/2025   TSH    Standing Status:   Future    Expected Date:   03/20/2024    Expiration Date:   03/20/2025      [name], MD  INTERVAL HISTORY: Patient returns for follow-up. Overall feeling well. Rash helped with steroid cream. Some over the upper chest and neck and improved with steroid cream.  No new weakness. No headaches.  Oncology History  Melanoma of abdominal wall (HCC)  03/2023 Initial Diagnosis   Melanoma of abdominal wall [facility])  He has a birth mark and noted larger last spring and then continued to change in size and color by late summer. He saw dermatology in Jan 2025. It went from smaller than a dime most of his life to about double the size when he saw dermatology.   04/18/2023 Pathology Results   A.   SKIN, RIGHT ABDOMINAL WALL, EXCISION: RESIDUAL MALIGNANT MELANOMA,  4.7 MM PT4 IN EXCISIONAL SPECIMEN, WITH SUPERFICIAL SPREADING AND  ADDITIONAL DESMOPLASTIC COMPONENT, MICROSCOPIC SATELLITE (4.0 X 1.8 MM),  LYMPHOVASCULAR AND PERINEURAL INVASION, MARGINS FREE    05/23/2023 PET scan   PET 1. No findings of active malignancy. 2. Low-grade activity along the margins of a 260 cc fluid collection along the superficial fascia margin of the right anterior abdominal wall, likely a postoperative fluid collection. Favor inflammatory activity over residual/recurrent malignancy. 3. Low-grade activity along the lateral margin  of a 2.4 cm right inguinal fluid collection, compatible with lymph node dissection site, likely inflammatory activity rather than neoplastic. 4. Right axillary stranding compatible with recent operative site, with low-grade activity along its margins, likely postinflammatory/post surgical activity. 5.  Aortic Atherosclerosis (ICD10-I70.0). 6. 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 7. Nonspecific subcutaneous edema in the left calf and dorsal left foot.   06/15/2023 Cancer Staging   Staging form: Melanoma of the Skin, AJCC 8th Edition - Pathologic stage from 06/15/2023: Stage IIIC (pT4a, pN1, cM0) - Signed by Izell Domino, MD on 06/15/2023 Stage  prefix: Initial diagnosis   06/27/2023 - 07/06/2023 Radiation Therapy   30 Gy/5 Fx radiation    09/13/2023 -  Chemotherapy   Patient is on Treatment Plan : MELANOMA ADJUVANT Nivolumab  (240) q14d      Genetic Testing   Negative CancerNext-Expanded +RNAinsight. The CancerNext-Expanded gene panel offered by [facility] and includes sequencing, rearrangement, and RNA analysis for the following 77 genes: AIP, ALK, APC, ATM, AXIN2, BAP1m BARD1, BMPR1Am BRCA1, BRCA2, BRIP1, CDC73, CDH1, CDK4, CDKN1B, CDKN2A, CEBPA, CHEK2, CTNNA1, DDX41, DICER1, EGFR, EPCAM, ETV6, FH, FLCN, GATA2, GREM1, HOXB13, KIT, LZTR1, MAX, MBD4, MEN1, MET, MITF, MLH1, MSH2, MSH3, MSH6, MUTYH, NF1, NF2, NTHL1, PALB2, PDGFRA, PHOX2B, PMS2, POLD1, POLE, POT1, PRKAR1A, PTCH1m PTEN, RAD51C, RAD51D, RB1, RET, RPS20, RUNX1, SDHA, SDHAF2, SDHB, SDHC, SDHD, SMAD4, SMARCA4, SMARCB1, SMARCE1, STK11, SUFU, TMEM127, TP53, TSC1, TSC2, VHL, WT1. Report date 08/03/23.       PHYSICAL EXAMINATION: ECOG PERFORMANCE STATUS: 2 - Symptomatic, <50% confined to bed  Vitals:   01/17/24 0934  BP: 107/73  Pulse: 62  Resp: 16  Temp: (!) 97.3 F (36.3 C)  SpO2: 95%   Filed Weights   01/17/24 0934  Weight: 195 lb 12.8 oz (88.8 kg)    GENERAL: alert, no distress and comfortable SKIN: skin color normal and no jaundice  Few scattered papular rashes over neck and arms EYES:  sclera clear LUNGS: clear to auscultation and no wheeze or rales with normal breathing effort HEART: regular rate & rhythm  ABDOMEN: abdomen soft, non-tender and nondistended.   Relevant data reviewed during this visit included labs.

## 2024-01-17 NOTE — Assessment & Plan Note (Addendum)
 Will continue treatment today Return with each cycle, lab and visit, then infusion

## 2024-01-17 NOTE — Assessment & Plan Note (Addendum)
 Scattered forearm rash bilaterally. Prescribed Kenalog 0.5% twice daily. Advised patient to call and report if worsening

## 2024-01-18 ENCOUNTER — Other Ambulatory Visit: Payer: Self-pay

## 2024-01-21 DIAGNOSIS — I10 Essential (primary) hypertension: Secondary | ICD-10-CM | POA: Diagnosis not present

## 2024-01-21 DIAGNOSIS — E782 Mixed hyperlipidemia: Secondary | ICD-10-CM | POA: Diagnosis not present

## 2024-01-21 DIAGNOSIS — G8194 Hemiplegia, unspecified affecting left nondominant side: Secondary | ICD-10-CM | POA: Diagnosis not present

## 2024-01-21 DIAGNOSIS — C4359 Malignant melanoma of other part of trunk: Secondary | ICD-10-CM | POA: Diagnosis not present

## 2024-01-21 DIAGNOSIS — Z23 Encounter for immunization: Secondary | ICD-10-CM | POA: Diagnosis not present

## 2024-01-21 DIAGNOSIS — R7303 Prediabetes: Secondary | ICD-10-CM | POA: Diagnosis not present

## 2024-01-21 DIAGNOSIS — I6930 Unspecified sequelae of cerebral infarction: Secondary | ICD-10-CM | POA: Diagnosis not present

## 2024-01-30 ENCOUNTER — Other Ambulatory Visit: Payer: Self-pay | Admitting: *Deleted

## 2024-01-30 ENCOUNTER — Other Ambulatory Visit: Payer: Self-pay

## 2024-01-30 DIAGNOSIS — C4359 Malignant melanoma of other part of trunk: Secondary | ICD-10-CM

## 2024-01-30 NOTE — Progress Notes (Unsigned)
 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 Treatment: adjuvant nivolumab . 09/13/23 Assessment & Plan Melanoma of abdominal wall (HCC) Will continue treatment today Return with each cycle, lab and visit, then infusion Skin rash Scattered forearm rash bilaterally. Prescribed Kenalog  0.5% twice daily. Advised patient to call and report if worsening At risk for adverse drug event Monitor for immune related toxicity Monitor thyroid  function with labs   [name], MD  INTERVAL HISTORY: Patient returns for follow-up. Report he can drink more water. Rash is better on steroid Kenalog . Not too much in the back. Report mini outbreak in the neck and leg and resolved with steroid.  No coughing, diarrhea, joint swelling, chest pain, stomach pain or other new symptoms.  Oncology History  Melanoma of abdominal wall (HCC)  03/2023 Initial Diagnosis   Melanoma of abdominal wall [facility])  He has a birth mark and noted larger last spring and then continued to change in size and color by late summer. He saw dermatology in Jan 2025. It went from smaller than a dime most of his life to about double the size when he saw dermatology.   04/18/2023 Pathology Results   A.   SKIN, RIGHT ABDOMINAL WALL, EXCISION: RESIDUAL MALIGNANT MELANOMA,  4.7 MM PT4 IN EXCISIONAL SPECIMEN, WITH SUPERFICIAL SPREADING AND  ADDITIONAL DESMOPLASTIC COMPONENT, MICROSCOPIC SATELLITE (4.0 X 1.8 MM),  LYMPHOVASCULAR AND PERINEURAL INVASION, MARGINS FREE    05/23/2023 PET scan   PET 1. No findings of active  malignancy. 2. Low-grade activity along the margins of a 260 cc fluid collection along the superficial fascia margin of the right anterior abdominal wall, likely a postoperative fluid collection. Favor inflammatory activity over residual/recurrent malignancy. 3. Low-grade activity along the lateral margin of a 2.4 cm right inguinal fluid collection, compatible with lymph node dissection site, likely inflammatory activity rather than neoplastic. 4. Right axillary stranding compatible with recent operative site, with low-grade activity along its margins, likely postinflammatory/post surgical activity. 5.  Aortic Atherosclerosis (ICD10-I70.0). 6. 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 7. Nonspecific subcutaneous edema in the left calf and dorsal left foot.   06/15/2023 Cancer Staging   Staging form: Melanoma of the Skin, AJCC 8th Edition - Pathologic stage from 06/15/2023: Stage IIIC (pT4a, pN1, cM0) - Signed by Izell Domino, MD on 06/15/2023 Stage prefix: Initial diagnosis   06/27/2023 - 07/06/2023 Radiation Therapy   30 Gy/5 Fx radiation    09/13/2023 -  Chemotherapy   Patient is on Treatment Plan : MELANOMA ADJUVANT Nivolumab  (240) q14d      Genetic Testing   Negative CancerNext-Expanded +RNAinsight. The CancerNext-Expanded gene panel offered by [facility] and includes sequencing, rearrangement, and RNA analysis for the following 77 genes: AIP, ALK, APC, ATM, AXIN2, BAP1m BARD1, BMPR1Am BRCA1, BRCA2, BRIP1, CDC73, CDH1, CDK4, CDKN1B, CDKN2A, CEBPA, CHEK2, CTNNA1, DDX41, DICER1, EGFR, EPCAM, ETV6, FH, FLCN, GATA2, GREM1, HOXB13, KIT, LZTR1, MAX, MBD4, MEN1, MET, MITF, MLH1, MSH2, MSH3, MSH6, MUTYH, NF1, NF2, NTHL1, PALB2, PDGFRA, PHOX2B, PMS2, POLD1, POLE, POT1, PRKAR1A, PTCH1m PTEN, RAD51C, RAD51D, RB1, RET, RPS20, RUNX1, SDHA, SDHAF2, SDHB, SDHC, SDHD, SMAD4, SMARCA4, SMARCB1, SMARCE1, STK11, SUFU, TMEM127,  TP53, TSC1, TSC2, VHL, WT1. Report date 08/03/23.       PHYSICAL  EXAMINATION: ECOG PERFORMANCE STATUS: 2  Vitals:   01/31/24 1428  BP: 106/78  Pulse: 75  Resp: 20  Temp: 97.9 F (36.6 C)  SpO2: 94%   Filed Weights   01/31/24 1428  Weight: 189 lb 12.8 oz (86.1 kg)    GENERAL: alert, no distress and comfortable SKIN: skin color normal and no jaundice Few small rash over arms and neck LUNGS: clear to auscultation and no wheeze or rales with normal breathing effort  Relevant data reviewed during this visit included labs.  New labs ordered.

## 2024-01-30 NOTE — Assessment & Plan Note (Signed)
 Scattered forearm rash bilaterally. Prescribed Kenalog 0.5% twice daily. Advised patient to call and report if worsening

## 2024-01-30 NOTE — Assessment & Plan Note (Signed)
 Will continue treatment today Return with each cycle, lab and visit, then infusion

## 2024-01-30 NOTE — Assessment & Plan Note (Signed)
 Monitor for immune related toxicity Monitor thyroid function with labs

## 2024-01-31 ENCOUNTER — Inpatient Hospital Stay

## 2024-01-31 ENCOUNTER — Other Ambulatory Visit: Payer: Self-pay

## 2024-01-31 VITALS — BP 106/78 | HR 75 | Temp 97.9°F | Resp 20 | Wt 189.8 lb

## 2024-01-31 DIAGNOSIS — C4359 Malignant melanoma of other part of trunk: Secondary | ICD-10-CM

## 2024-01-31 DIAGNOSIS — Z5112 Encounter for antineoplastic immunotherapy: Secondary | ICD-10-CM | POA: Diagnosis not present

## 2024-01-31 DIAGNOSIS — R21 Rash and other nonspecific skin eruption: Secondary | ICD-10-CM | POA: Diagnosis not present

## 2024-01-31 DIAGNOSIS — Z9189 Other specified personal risk factors, not elsewhere classified: Secondary | ICD-10-CM

## 2024-01-31 LAB — CBC WITH DIFFERENTIAL (CANCER CENTER ONLY)
Abs Immature Granulocytes: 0.02 K/uL (ref 0.00–0.07)
Basophils Absolute: 0.1 K/uL (ref 0.0–0.1)
Basophils Relative: 1 %
Eosinophils Absolute: 0.2 K/uL (ref 0.0–0.5)
Eosinophils Relative: 2 %
HCT: 43.7 % (ref 39.0–52.0)
Hemoglobin: 15.8 g/dL (ref 13.0–17.0)
Immature Granulocytes: 0 %
Lymphocytes Relative: 18 %
Lymphs Abs: 1.6 K/uL (ref 0.7–4.0)
MCH: 30.9 pg (ref 26.0–34.0)
MCHC: 36.2 g/dL — ABNORMAL HIGH (ref 30.0–36.0)
MCV: 85.5 fL (ref 80.0–100.0)
Monocytes Absolute: 0.8 K/uL (ref 0.1–1.0)
Monocytes Relative: 9 %
Neutro Abs: 6 K/uL (ref 1.7–7.7)
Neutrophils Relative %: 70 %
Platelet Count: 244 K/uL (ref 150–400)
RBC: 5.11 MIL/uL (ref 4.22–5.81)
RDW: 13.1 % (ref 11.5–15.5)
WBC Count: 8.5 K/uL (ref 4.0–10.5)
nRBC: 0 % (ref 0.0–0.2)

## 2024-01-31 LAB — CMP (CANCER CENTER ONLY)
ALT: 32 U/L (ref 0–44)
AST: 31 U/L (ref 15–41)
Albumin: 4.6 g/dL (ref 3.5–5.0)
Alkaline Phosphatase: 60 U/L (ref 38–126)
Anion gap: 13 (ref 5–15)
BUN: 24 mg/dL — ABNORMAL HIGH (ref 8–23)
CO2: 25 mmol/L (ref 22–32)
Calcium: 10 mg/dL (ref 8.9–10.3)
Chloride: 100 mmol/L (ref 98–111)
Creatinine: 1.16 mg/dL (ref 0.61–1.24)
GFR, Estimated: >60 mL/min (ref >60)
Glucose, Bld: 115 mg/dL — ABNORMAL HIGH (ref 70–99)
Potassium: 3.7 mmol/L (ref 3.5–5.1)
Sodium: 137 mmol/L (ref 135–145)
Total Bilirubin: 0.7 mg/dL (ref 0.0–1.2)
Total Protein: 7.6 g/dL (ref 6.5–8.1)

## 2024-01-31 LAB — T4, FREE: Free T4: 0.88 ng/dL (ref 0.61–1.12)

## 2024-01-31 LAB — TSH: TSH: 1.54 u[IU]/mL (ref 0.350–4.500)

## 2024-01-31 MED ORDER — SODIUM CHLORIDE 0.9 % IV SOLN
240.0000 mg | Freq: Once | INTRAVENOUS | Status: AC
  Administered 2024-01-31: 240 mg via INTRAVENOUS
  Filled 2024-01-31: qty 24

## 2024-01-31 MED ORDER — SODIUM CHLORIDE 0.9 % IV SOLN: INTRAVENOUS | Status: DC

## 2024-01-31 NOTE — Patient Instructions (Signed)
 [facility] - A DEPT OF [facility]   Discharge Instructions: Thank you for choosing [facility] to provide your oncology and hematology care.   If you have a lab appointment with the Cancer Center, please go directly to the Cancer Center and check in at the registration area.   Wear comfortable clothing and clothing appropriate for easy access to any Portacath or PICC line.   We strive to give you quality time with your provider. You may need to reschedule your appointment if you arrive late (15 or more minutes).  Arriving late affects you and other patients whose appointments are after yours.  Also, if you miss three or more appointments without notifying the office, you may be dismissed from the clinic at the provider's discretion.      For prescription refill requests, have your pharmacy contact our office and allow 72 hours for refills to be completed.    Today you received the following chemotherapy and/or immunotherapy agents: Nivolumab (Opdivo)      To help prevent nausea and vomiting after your treatment, we encourage you to take your nausea medication as directed.  BELOW ARE SYMPTOMS THAT SHOULD BE REPORTED IMMEDIATELY: *FEVER GREATER THAN 100.4 F (38 C) OR HIGHER *CHILLS OR SWEATING *NAUSEA AND VOMITING THAT IS NOT CONTROLLED WITH YOUR NAUSEA MEDICATION *UNUSUAL SHORTNESS OF BREATH *UNUSUAL BRUISING OR BLEEDING *URINARY PROBLEMS (pain or burning when urinating, or frequent urination) *BOWEL PROBLEMS (unusual diarrhea, constipation, pain near the anus) TENDERNESS IN MOUTH AND THROAT WITH OR WITHOUT PRESENCE OF ULCERS (sore throat, sores in mouth, or a toothache) UNUSUAL RASH, SWELLING OR PAIN  UNUSUAL VAGINAL DISCHARGE OR ITCHING   Items with * indicate a potential emergency and should be followed up as soon as possible or go to the Emergency Department if any problems should occur.  Please show the CHEMOTHERAPY ALERT CARD or  IMMUNOTHERAPY ALERT CARD at check-in to the Emergency Department and triage nurse.  Should you have questions after your visit or need to cancel or reschedule your appointment, please contact [facility] - A DEPT OF [facility]  Dept: [phone]  and follow the prompts.  Office hours are 8:00 a.m. to 4:30 p.m. Monday - Friday. Please note that voicemails left after 4:00 p.m. may not be returned until the following business day.  We are closed weekends and major holidays. You have access to a nurse at all times for urgent questions. Please call the main number to the clinic Dept: [phone] and follow the prompts.   For any non-urgent questions, you may also contact your provider using MyChart. We now offer e-Visits for anyone 4 and older to request care online for non-urgent symptoms. For details visit [email or web address].   Also download the MyChart app! Go to the app store, search "MyChart", open the app, select [place], and log in with your MyChart username and password.

## 2024-02-03 ENCOUNTER — Other Ambulatory Visit: Payer: Self-pay

## 2024-02-13 NOTE — Progress Notes (Unsigned)
 Patient Care Team: Ransom Other, MD as PCP - General (Internal Medicine)  Clinic Day:  02/14/2024  Referring physician: Tina Pauletta BROCKS, MD  ASSESSMENT & PLAN:   Assessment & Plan: Melanoma of abdominal wall (HCC) Tolerating treatment well.  Will proceed with treatment today Please triamcinolone  cream when needed for skin rash. Return with each cycle, lab and visit, then infusion    Skin rash Has improved with use of triamcinolone  cream.  Continues to use on as-needed basis.  Patient reports near resolution of rash.  Will continue to monitor closely.  Plan Labs reviewed. - CBC and CMP unremarkable. - Awaiting results of thyroid  panel. Patient stable and doing very well. Proceed with immunotherapy nivolumab . Labs, follow-up, and subsequent treatments as scheduled.  The patient understands the plans discussed today and is in agreement with them.  He knows to contact our office if he develops concerns prior to his next appointment.  I provided 20 minutes of face-to-face time during this encounter and > 50% was spent counseling as documented under my assessment and plan.    Powell FORBES Lessen, NP  [facility] - A DEPT OF [facility] [address] [place] [address] Dept: [phone] Dept Fax: [phone]   No orders of the defined types were placed in this encounter.     CHIEF COMPLAINT:  CC: Melanoma of abdominal wall  Current Treatment: Adjuvant nivolumab   INTERVAL HISTORY:  Anthony Hardin is here today for repeat clinical assessment.  He last saw Dr. Tina on 01/31/2024.  Tolerating treatment very well.  He did have a rash present on both arms.  Sometimes it is also on his neck and legs.  Started using prescribed triamcinolone  cream when needed.  He states that the rash was all but disappeared.  No other concerns or complaints.  He denies chest pain, chest pressure, or shortness of breath. He denies  headaches or visual disturbances. He denies abdominal pain, nausea, vomiting, or changes in bowel or bladder habits.  He denies fevers or chills. He denies pain. His appetite is good. His weight has increased 7 pounds over last 2 weeks.  I have reviewed the past medical history, past surgical history, social history and family history with the patient and they are unchanged from previous note.  ALLERGIES:  is allergic to aspirin, cat dander, scallops [shellfish allergy], and cats claw (uncaria tomentosa).  MEDICATIONS:  Current Outpatient Medications  Medication Sig Dispense Refill   amLODipine  (NORVASC ) 10 MG tablet Take 1 tablet (10 mg total) by mouth daily. 30 tablet 1   atorvastatin (LIPITOR) 40 MG tablet Take 40 mg by mouth daily.     hydrochlorothiazide  (HYDRODIURIL ) 25 MG tablet Take 1 tablet (25 mg total) by mouth daily. 30 tablet 1   lisinopril  (PRINIVIL ,ZESTRIL ) 20 MG tablet Take 1 tablet (20 mg total) by mouth daily. 30 tablet 1   ondansetron  (ZOFRAN ) 8 MG tablet Take 1 tablet (8 mg total) by mouth every 8 (eight) hours as needed for nausea or vomiting. 30 tablet 1   prochlorperazine  (COMPAZINE ) 10 MG tablet Take 1 tablet (10 mg total) by mouth every 6 (six) hours as needed for nausea or vomiting. 30 tablet 1   triamcinolone  ointment (KENALOG ) 0.5 % Apply 1 Application topically 2 (two) times daily. 30 g 0   No current facility-administered medications for this visit.    HISTORY OF PRESENT ILLNESS:   Oncology History  Melanoma of abdominal wall (HCC)  03/2023 Initial Diagnosis   Melanoma of abdominal wall [facility])  He has a birth mark and noted larger last spring and then continued to change in size and color by late summer. He saw dermatology in Jan 2025. It went from smaller than a dime most of his life to about double the size when he saw dermatology.   04/18/2023 Pathology Results   A.   SKIN, RIGHT ABDOMINAL WALL, EXCISION: RESIDUAL MALIGNANT MELANOMA,  4.7 MM PT4 IN  EXCISIONAL SPECIMEN, WITH SUPERFICIAL SPREADING AND  ADDITIONAL DESMOPLASTIC COMPONENT, MICROSCOPIC SATELLITE (4.0 X 1.8 MM),  LYMPHOVASCULAR AND PERINEURAL INVASION, MARGINS FREE    05/23/2023 PET scan   PET 1. No findings of active malignancy. 2. Low-grade activity along the margins of a 260 cc fluid collection along the superficial fascia margin of the right anterior abdominal wall, likely a postoperative fluid collection. Favor inflammatory activity over residual/recurrent malignancy. 3. Low-grade activity along the lateral margin of a 2.4 cm right inguinal fluid collection, compatible with lymph node dissection site, likely inflammatory activity rather than neoplastic. 4. Right axillary stranding compatible with recent operative site, with low-grade activity along its margins, likely postinflammatory/post surgical activity. 5.  Aortic Atherosclerosis (ICD10-I70.0). 6. 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 7. Nonspecific subcutaneous edema in the left calf and dorsal left foot.   06/15/2023 Cancer Staging   Staging form: Melanoma of the Skin, AJCC 8th Edition - Pathologic stage from 06/15/2023: Stage IIIC (pT4a, pN1, cM0) - Signed by Izell Domino, MD on 06/15/2023 Stage prefix: Initial diagnosis   06/27/2023 - 07/06/2023 Radiation Therapy   30 Gy/5 Fx radiation    09/13/2023 -  Chemotherapy   Patient is on Treatment Plan : MELANOMA ADJUVANT Nivolumab  (240) q14d      Genetic Testing   Negative CancerNext-Expanded +RNAinsight. The CancerNext-Expanded gene panel offered by [facility] and includes sequencing, rearrangement, and RNA analysis for the following 77 genes: AIP, ALK, APC, ATM, AXIN2, BAP1m BARD1, BMPR1Am BRCA1, BRCA2, BRIP1, CDC73, CDH1, CDK4, CDKN1B, CDKN2A, CEBPA, CHEK2, CTNNA1, DDX41, DICER1, EGFR, EPCAM, ETV6, FH, FLCN, GATA2, GREM1, HOXB13, KIT, LZTR1, MAX, MBD4, MEN1, MET, MITF, MLH1, MSH2, MSH3, MSH6, MUTYH, NF1, NF2, NTHL1, PALB2, PDGFRA, PHOX2B, PMS2,  POLD1, POLE, POT1, PRKAR1A, PTCH1m PTEN, RAD51C, RAD51D, RB1, RET, RPS20, RUNX1, SDHA, SDHAF2, SDHB, SDHC, SDHD, SMAD4, SMARCA4, SMARCB1, SMARCE1, STK11, SUFU, TMEM127, TP53, TSC1, TSC2, VHL, WT1. Report date 08/03/23.        REVIEW OF SYSTEMS:   Constitutional: Denies fevers, chills or abnormal weight loss Eyes: Denies blurriness of vision Ears, nose, mouth, throat, and face: Denies mucositis or sore throat Respiratory: Denies cough, dyspnea or wheezes Cardiovascular: Denies palpitation, chest discomfort or lower extremity swelling Gastrointestinal:  Denies nausea, heartburn or change in bowel habits Skin: Improved skin rash when using prescribed triamcinolone  cream. Lymphatics: Denies new lymphadenopathy or easy bruising Neurological:Denies numbness, tingling or new weaknesses Behavioral/Psych: Mood is stable, no new changes  All other systems were reviewed with the patient and are negative.   VITALS:   Today's Vitals   02/14/24 1020 02/14/24 1035  BP: 110/60   Pulse: 65   Resp: 17   Temp: 97.7 F (36.5 C)   SpO2: 96%   Weight: 196 lb (88.9 kg)   PainSc:  8    Body mass index is 28.94 kg/m.   Wt Readings from Last 3 Encounters:  02/14/24 196 lb (88.9 kg)  01/31/24 189 lb 12.8 oz (86.1 kg)  01/17/24 195 lb 12.8 oz (88.8 kg)  Body mass index is 28.94 kg/m.  Performance status (ECOG): 1 - Symptomatic but completely ambulatory  PHYSICAL EXAM:   GENERAL:alert, no distress and comfortable SKIN: skin color, texture, turgor are normal.  Evidence of scabbed over rash on bilateral forearms and lower legs.  Some redness noted.  Nonpainful.  No warmth present.  Skin intact. EYES: normal, Conjunctiva are pink and non-injected, sclera clear OROPHARYNX:no exudate, no erythema and lips, buccal mucosa, and tongue normal  NECK: supple, thyroid  normal size, non-tender, without nodularity LYMPH:  no palpable lymphadenopathy in the cervical, axillary or inguinal LUNGS: clear to  auscultation and percussion with normal breathing effort HEART: regular rate & rhythm and no murmurs and no lower extremity edema ABDOMEN:abdomen soft, non-tender and normal bowel sounds Musculoskeletal:no cyanosis of digits and no clubbing  NEURO: alert & oriented x 3 with fluent speech, no focal motor/sensory deficits  LABORATORY DATA:  I have reviewed the data as listed    Component Value Date/Time   NA 138 02/14/2024 1012   K 3.9 02/14/2024 1012   CL 103 02/14/2024 1012   CO2 23 02/14/2024 1012   GLUCOSE 117 (H) 02/14/2024 1012   BUN 17 02/14/2024 1012   CREATININE 0.94 02/14/2024 1012   CALCIUM 9.6 02/14/2024 1012   PROT 7.3 02/14/2024 1012   ALBUMIN 4.5 02/14/2024 1012   AST 28 02/14/2024 1012   ALT 32 02/14/2024 1012   ALKPHOS 52 02/14/2024 1012   BILITOT 0.7 02/14/2024 1012   GFRNONAA >60 02/14/2024 1012   GFRAA >90 11/05/2013 1246     Lab Results  Component Value Date   WBC 6.1 02/14/2024   NEUTROABS 3.8 02/14/2024   HGB 15.4 02/14/2024   HCT 43.2 02/14/2024   MCV 86.7 02/14/2024   PLT 204 02/14/2024

## 2024-02-13 NOTE — Assessment & Plan Note (Addendum)
 Tolerating treatment well.  Will proceed with treatment today Please triamcinolone  cream when needed for skin rash. Return with each cycle, lab and visit, then infusion

## 2024-02-14 ENCOUNTER — Inpatient Hospital Stay

## 2024-02-14 ENCOUNTER — Inpatient Hospital Stay: Admitting: Nurse Practitioner

## 2024-02-14 VITALS — BP 110/60 | HR 65 | Temp 97.7°F | Resp 17 | Wt 196.0 lb

## 2024-02-14 VITALS — BP 106/69 | HR 60 | Resp 16

## 2024-02-14 DIAGNOSIS — Z5112 Encounter for antineoplastic immunotherapy: Secondary | ICD-10-CM | POA: Diagnosis present

## 2024-02-14 DIAGNOSIS — C4359 Malignant melanoma of other part of trunk: Secondary | ICD-10-CM

## 2024-02-14 DIAGNOSIS — Z79620 Long term (current) use of immunosuppressive biologic: Secondary | ICD-10-CM | POA: Diagnosis not present

## 2024-02-14 LAB — CMP (CANCER CENTER ONLY)
ALT: 32 U/L (ref 0–44)
AST: 28 U/L (ref 15–41)
Albumin: 4.5 g/dL (ref 3.5–5.0)
Alkaline Phosphatase: 52 U/L (ref 38–126)
Anion gap: 11 (ref 5–15)
BUN: 17 mg/dL (ref 8–23)
CO2: 23 mmol/L (ref 22–32)
Calcium: 9.6 mg/dL (ref 8.9–10.3)
Chloride: 103 mmol/L (ref 98–111)
Creatinine: 0.94 mg/dL (ref 0.61–1.24)
GFR, Estimated: >60 mL/min (ref >60)
Glucose, Bld: 117 mg/dL — ABNORMAL HIGH (ref 70–99)
Potassium: 3.9 mmol/L (ref 3.5–5.1)
Sodium: 138 mmol/L (ref 135–145)
Total Bilirubin: 0.7 mg/dL (ref 0.0–1.2)
Total Protein: 7.3 g/dL (ref 6.5–8.1)

## 2024-02-14 LAB — CBC WITH DIFFERENTIAL (CANCER CENTER ONLY)
Abs Immature Granulocytes: 0.01 K/uL (ref 0.00–0.07)
Basophils Absolute: 0 K/uL (ref 0.0–0.1)
Basophils Relative: 1 %
Eosinophils Absolute: 0.1 K/uL (ref 0.0–0.5)
Eosinophils Relative: 2 %
HCT: 43.2 % (ref 39.0–52.0)
Hemoglobin: 15.4 g/dL (ref 13.0–17.0)
Immature Granulocytes: 0 %
Lymphocytes Relative: 24 %
Lymphs Abs: 1.4 K/uL (ref 0.7–4.0)
MCH: 30.9 pg (ref 26.0–34.0)
MCHC: 35.6 g/dL (ref 30.0–36.0)
MCV: 86.7 fL (ref 80.0–100.0)
Monocytes Absolute: 0.6 K/uL (ref 0.1–1.0)
Monocytes Relative: 10 %
Neutro Abs: 3.8 K/uL (ref 1.7–7.7)
Neutrophils Relative %: 63 %
Platelet Count: 204 K/uL (ref 150–400)
RBC: 4.98 MIL/uL (ref 4.22–5.81)
RDW: 13.2 % (ref 11.5–15.5)
WBC Count: 6.1 K/uL (ref 4.0–10.5)
nRBC: 0 % (ref 0.0–0.2)

## 2024-02-14 LAB — TSH: TSH: 1.53 u[IU]/mL (ref 0.350–4.500)

## 2024-02-14 LAB — T4, FREE: Free T4: 0.73 ng/dL (ref 0.61–1.12)

## 2024-02-14 MED ORDER — SODIUM CHLORIDE 0.9 % IV SOLN: INTRAVENOUS | Status: DC

## 2024-02-14 MED ORDER — SODIUM CHLORIDE 0.9 % IV SOLN
240.0000 mg | Freq: Once | INTRAVENOUS | Status: AC
  Administered 2024-02-14: 240 mg via INTRAVENOUS
  Filled 2024-02-14: qty 24

## 2024-02-14 NOTE — Patient Instructions (Signed)
 [facility] - A DEPT OF [facility]  Discharge Instructions: Thank you for choosing [facility] to provide your oncology and hematology care.   If you have a lab appointment with the Cancer Center, please go directly to the Cancer Center and check in at the registration area.   Wear comfortable clothing and clothing appropriate for easy access to any Portacath or PICC line.   We strive to give you quality time with your provider. You may need to reschedule your appointment if you arrive late (15 or more minutes).  Arriving late affects you and other patients whose appointments are after yours.  Also, if you miss three or more appointments without notifying the office, you may be dismissed from the clinic at the provider's discretion.      For prescription refill requests, have your pharmacy contact our office and allow 72 hours for refills to be completed.    Today you received the following chemotherapy and/or immunotherapy agents: Opdivo    To help prevent nausea and vomiting after your treatment, we encourage you to take your nausea medication as directed.  BELOW ARE SYMPTOMS THAT SHOULD BE REPORTED IMMEDIATELY: *FEVER GREATER THAN 100.4 F (38 C) OR HIGHER *CHILLS OR SWEATING *NAUSEA AND VOMITING THAT IS NOT CONTROLLED WITH YOUR NAUSEA MEDICATION *UNUSUAL SHORTNESS OF BREATH *UNUSUAL BRUISING OR BLEEDING *URINARY PROBLEMS (pain or burning when urinating, or frequent urination) *BOWEL PROBLEMS (unusual diarrhea, constipation, pain near the anus) TENDERNESS IN MOUTH AND THROAT WITH OR WITHOUT PRESENCE OF ULCERS (sore throat, sores in mouth, or a toothache) UNUSUAL RASH, SWELLING OR PAIN  UNUSUAL VAGINAL DISCHARGE OR ITCHING   Items with * indicate a potential emergency and should be followed up as soon as possible or go to the Emergency Department if any problems should occur.  Please show the CHEMOTHERAPY ALERT CARD or IMMUNOTHERAPY ALERT  CARD at check-in to the Emergency Department and triage nurse.  Should you have questions after your visit or need to cancel or reschedule your appointment, please contact [facility] - A DEPT OF [facility]  Dept: [phone]  and follow the prompts.  Office hours are 8:00 a.m. to 4:30 p.m. Monday - Friday. Please note that voicemails left after 4:00 p.m. may not be returned until the following business day.  We are closed weekends and major holidays. You have access to a nurse at all times for urgent questions. Please call the main number to the clinic Dept: [phone] and follow the prompts.   For any non-urgent questions, you may also contact your provider using MyChart. We now offer e-Visits for anyone 24 and older to request care online for non-urgent symptoms. For details visit [email or web address].   Also download the MyChart app! Go to the app store, search MyChart, open the app, select [place], and log in with your MyChart username and password.

## 2024-02-17 ENCOUNTER — Encounter: Payer: Self-pay | Admitting: Nurse Practitioner

## 2024-02-22 ENCOUNTER — Other Ambulatory Visit: Payer: Self-pay

## 2024-02-25 NOTE — Assessment & Plan Note (Addendum)
 Tolerating treatment well.  Will proceed with treatment today Please triamcinolone  cream when needed for skin rash. Return with each cycle, lab and visit, then infusion

## 2024-02-25 NOTE — Progress Notes (Unsigned)
 [facility] OFFICE PROGRESS NOTE  Patient Care Team: Ransom Other, MD as PCP - General (Internal Medicine)  Assessment & Plan Melanoma of abdominal wall [facility]) Tolerating treatment well.  Will proceed with treatment today Please triamcinolone  cream when needed for skin rash. Return with each cycle, lab and visit, then infusion   No orders of the defined types were placed in this encounter.    [name], MD  INTERVAL HISTORY: Patient returns for follow-up.  Oncology History  Melanoma of abdominal wall (HCC)  03/2023 Initial Diagnosis   Melanoma of abdominal wall [facility])  He has a birth mark and noted larger last spring and then continued to change in size and color by late summer. He saw dermatology in Jan 2025. It went from smaller than a dime most of his life to about double the size when he saw dermatology.   04/18/2023 Pathology Results   A.   SKIN, RIGHT ABDOMINAL WALL, EXCISION: RESIDUAL MALIGNANT MELANOMA,  4.7 MM PT4 IN EXCISIONAL SPECIMEN, WITH SUPERFICIAL SPREADING AND  ADDITIONAL DESMOPLASTIC COMPONENT, MICROSCOPIC SATELLITE (4.0 X 1.8 MM),  LYMPHOVASCULAR AND PERINEURAL INVASION, MARGINS FREE    05/23/2023 PET scan   PET 1. No findings of active malignancy. 2. Low-grade activity along the margins of a 260 cc fluid collection along the superficial fascia margin of the right anterior abdominal wall, likely a postoperative fluid collection. Favor inflammatory activity over residual/recurrent malignancy. 3. Low-grade activity along the lateral margin of a 2.4 cm right inguinal fluid collection, compatible with lymph node dissection site, likely inflammatory activity rather than neoplastic. 4. Right axillary stranding compatible with recent operative site, with low-grade activity along its margins, likely postinflammatory/post surgical activity. 5.  Aortic Atherosclerosis (ICD10-I70.0). 6. 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 7.  Nonspecific subcutaneous edema in the left calf and dorsal left foot.   06/15/2023 Cancer Staging   Staging form: Melanoma of the Skin, AJCC 8th Edition - Pathologic stage from 06/15/2023: Stage IIIC (pT4a, pN1, cM0) - Signed by Izell Domino, MD on 06/15/2023 Stage prefix: Initial diagnosis   06/27/2023 - 07/06/2023 Radiation Therapy   30 Gy/5 Fx radiation    09/13/2023 -  Chemotherapy   Patient is on Treatment Plan : MELANOMA ADJUVANT Nivolumab  (240) q14d      Genetic Testing   Negative CancerNext-Expanded +RNAinsight. The CancerNext-Expanded gene panel offered by [facility] and includes sequencing, rearrangement, and RNA analysis for the following 77 genes: AIP, ALK, APC, ATM, AXIN2, BAP1m BARD1, BMPR1Am BRCA1, BRCA2, BRIP1, CDC73, CDH1, CDK4, CDKN1B, CDKN2A, CEBPA, CHEK2, CTNNA1, DDX41, DICER1, EGFR, EPCAM, ETV6, FH, FLCN, GATA2, GREM1, HOXB13, KIT, LZTR1, MAX, MBD4, MEN1, MET, MITF, MLH1, MSH2, MSH3, MSH6, MUTYH, NF1, NF2, NTHL1, PALB2, PDGFRA, PHOX2B, PMS2, POLD1, POLE, POT1, PRKAR1A, PTCH1m PTEN, RAD51C, RAD51D, RB1, RET, RPS20, RUNX1, SDHA, SDHAF2, SDHB, SDHC, SDHD, SMAD4, SMARCA4, SMARCB1, SMARCE1, STK11, SUFU, TMEM127, TP53, TSC1, TSC2, VHL, WT1. Report date 08/03/23.       PHYSICAL EXAMINATION: ECOG PERFORMANCE STATUS: {CHL ONC ECOG PS:[phone]}  There were no vitals filed for this visit. There were no vitals filed for this visit.  GENERAL: alert, no distress and comfortable SKIN: skin color normal and no jaundice or bruising or petechiae on exposed skin EYES: normal, sclera clear OROPHARYNX: no exudate  NECK: No palpable mass LYMPH:  no palpable cervical, axillary lymphadenopathy  LUNGS: clear to auscultation and no wheeze or rales with normal breathing effort HEART: regular rate & rhythm  ABDOMEN: abdomen soft, non-tender and nondistended. Musculoskeletal:  no edema NEURO: no focal motor/sensory deficits  Relevant data reviewed during this visit included labs.  New labs  ordered.

## 2024-02-28 ENCOUNTER — Inpatient Hospital Stay

## 2024-02-28 VITALS — BP 117/76 | HR 82 | Temp 97.9°F | Resp 17 | Ht 69.0 in

## 2024-02-28 DIAGNOSIS — C4359 Malignant melanoma of other part of trunk: Secondary | ICD-10-CM | POA: Diagnosis not present

## 2024-02-28 DIAGNOSIS — R21 Rash and other nonspecific skin eruption: Secondary | ICD-10-CM | POA: Diagnosis not present

## 2024-02-28 DIAGNOSIS — Z5112 Encounter for antineoplastic immunotherapy: Secondary | ICD-10-CM | POA: Diagnosis not present

## 2024-02-28 LAB — CBC WITH DIFFERENTIAL (CANCER CENTER ONLY)
Abs Immature Granulocytes: 0.02 K/uL (ref 0.00–0.07)
Basophils Absolute: 0.1 K/uL (ref 0.0–0.1)
Basophils Relative: 1 %
Eosinophils Absolute: 0.1 K/uL (ref 0.0–0.5)
Eosinophils Relative: 1 %
HCT: 44.4 % (ref 39.0–52.0)
Hemoglobin: 15.8 g/dL (ref 13.0–17.0)
Immature Granulocytes: 0 %
Lymphocytes Relative: 15 %
Lymphs Abs: 1.5 K/uL (ref 0.7–4.0)
MCH: 30.5 pg (ref 26.0–34.0)
MCHC: 35.6 g/dL (ref 30.0–36.0)
MCV: 85.7 fL (ref 80.0–100.0)
Monocytes Absolute: 0.9 K/uL (ref 0.1–1.0)
Monocytes Relative: 9 %
Neutro Abs: 7.7 K/uL (ref 1.7–7.7)
Neutrophils Relative %: 74 %
Platelet Count: 216 K/uL (ref 150–400)
RBC: 5.18 MIL/uL (ref 4.22–5.81)
RDW: 12.8 % (ref 11.5–15.5)
WBC Count: 10.3 K/uL (ref 4.0–10.5)
nRBC: 0 % (ref 0.0–0.2)

## 2024-02-28 LAB — CMP (CANCER CENTER ONLY)
ALT: 28 U/L (ref 0–44)
AST: 26 U/L (ref 15–41)
Albumin: 4.5 g/dL (ref 3.5–5.0)
Alkaline Phosphatase: 80 U/L (ref 38–126)
Anion gap: 14 (ref 5–15)
BUN: 18 mg/dL (ref 8–23)
CO2: 23 mmol/L (ref 22–32)
Calcium: 10.1 mg/dL (ref 8.9–10.3)
Chloride: 100 mmol/L (ref 98–111)
Creatinine: 1.01 mg/dL (ref 0.61–1.24)
GFR, Estimated: >60 mL/min (ref >60)
Glucose, Bld: 119 mg/dL — ABNORMAL HIGH (ref 70–99)
Potassium: 3.6 mmol/L (ref 3.5–5.1)
Sodium: 136 mmol/L (ref 135–145)
Total Bilirubin: 0.8 mg/dL (ref 0.0–1.2)
Total Protein: 7.9 g/dL (ref 6.5–8.1)

## 2024-02-28 LAB — TSH: TSH: 1.36 u[IU]/mL (ref 0.350–4.500)

## 2024-02-28 LAB — T4, FREE: Free T4: 1.25 ng/dL (ref 0.80–2.00)

## 2024-02-28 MED ORDER — SODIUM CHLORIDE 0.9 % IV SOLN
240.0000 mg | Freq: Once | INTRAVENOUS | Status: AC
  Administered 2024-02-28: 16:00:00 240 mg via INTRAVENOUS
  Filled 2024-02-28: qty 24

## 2024-02-28 MED ORDER — SODIUM CHLORIDE 0.9 % IV SOLN: INTRAVENOUS | Status: DC

## 2024-02-28 NOTE — Patient Instructions (Signed)
 [facility] - A DEPT OF [facility]  Discharge Instructions: Thank you for choosing [facility] to provide your oncology and hematology care.   If you have a lab appointment with the Cancer Center, please go directly to the Cancer Center and check in at the registration area.   Wear comfortable clothing and clothing appropriate for easy access to any Portacath or PICC line.   We strive to give you quality time with your provider. You may need to reschedule your appointment if you arrive late (15 or more minutes).  Arriving late affects you and other patients whose appointments are after yours.  Also, if you miss three or more appointments without notifying the office, you may be dismissed from the clinic at the provider's discretion.      For prescription refill requests, have your pharmacy contact our office and allow 72 hours for refills to be completed.    Today you received the following chemotherapy and/or immunotherapy agents: nivolumab (OPDIVO)       To help prevent nausea and vomiting after your treatment, we encourage you to take your nausea medication as directed.  BELOW ARE SYMPTOMS THAT SHOULD BE REPORTED IMMEDIATELY: *FEVER GREATER THAN 100.4 F (38 C) OR HIGHER *CHILLS OR SWEATING *NAUSEA AND VOMITING THAT IS NOT CONTROLLED WITH YOUR NAUSEA MEDICATION *UNUSUAL SHORTNESS OF BREATH *UNUSUAL BRUISING OR BLEEDING *URINARY PROBLEMS (pain or burning when urinating, or frequent urination) *BOWEL PROBLEMS (unusual diarrhea, constipation, pain near the anus) TENDERNESS IN MOUTH AND THROAT WITH OR WITHOUT PRESENCE OF ULCERS (sore throat, sores in mouth, or a toothache) UNUSUAL RASH, SWELLING OR PAIN  UNUSUAL VAGINAL DISCHARGE OR ITCHING   Items with * indicate a potential emergency and should be followed up as soon as possible or go to the Emergency Department if any problems should occur.  Please show the CHEMOTHERAPY ALERT CARD or  IMMUNOTHERAPY ALERT CARD at check-in to the Emergency Department and triage nurse.  Should you have questions after your visit or need to cancel or reschedule your appointment, please contact [facility] - A DEPT OF [facility]  Dept: [phone]  and follow the prompts.  Office hours are 8:00 a.m. to 4:30 p.m. Monday - Friday. Please note that voicemails left after 4:00 p.m. may not be returned until the following business day.  We are closed weekends and major holidays. You have access to a nurse at all times for urgent questions. Please call the main number to the clinic Dept: [phone] and follow the prompts.   For any non-urgent questions, you may also contact your provider using MyChart. We now offer e-Visits for anyone 3 and older to request care online for non-urgent symptoms. For details visit [email or web address].   Also download the MyChart app! Go to the app store, search "MyChart", open the app, select [place], and log in with your MyChart username and password.

## 2024-02-28 NOTE — Assessment & Plan Note (Addendum)
 Improved continue Kenalog  0.5% twice daily until resolved. Advised patient to call and report if worsening

## 2024-03-01 ENCOUNTER — Other Ambulatory Visit: Payer: Self-pay

## 2024-03-14 ENCOUNTER — Other Ambulatory Visit: Payer: Self-pay

## 2024-03-18 ENCOUNTER — Other Ambulatory Visit: Payer: Self-pay

## 2024-03-19 NOTE — Assessment & Plan Note (Addendum)
 Improved continue Kenalog  0.5% twice daily until resolved. Refilled today Advised patient to call and report if worsening

## 2024-03-19 NOTE — Progress Notes (Signed)
 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 Treatment: adjuvant nivolumab . 09/13/23  Rashes improved. A few over right arm. Assessment & Plan Melanoma of abdominal wall (HCC) Tolerating treatment well.  Will continue with treatment today Use triamcinolone  cream when needed for skin rash. Return with each cycle, lab and visit, then infusion We will repeat PET scan in February Skin rash Improved continue Kenalog  0.5% twice daily until resolved. Refilled today Advised patient to call and report if worsening    [name], MD  INTERVAL HISTORY: Patient returns for follow-up. Report tired from traveling. He denies new coughing, short of breath, chest pain, diarrhea, joint pain.  No new mass or lump. He see Derm in Feb.  Rashes controlled with kenalog .  Oncology History  Melanoma of abdominal wall (HCC)  03/2023 Initial Diagnosis   Melanoma of abdominal wall [facility])  He has a birth mark and noted larger last spring and then continued to change in size and color by late summer. He saw dermatology in Jan 2025. It went from smaller than a dime most of his life to about double the size when he saw dermatology.   04/18/2023 Pathology Results   A.   SKIN, RIGHT ABDOMINAL WALL, EXCISION: RESIDUAL MALIGNANT MELANOMA,  4.7 MM PT4 IN EXCISIONAL SPECIMEN, WITH SUPERFICIAL SPREADING AND  ADDITIONAL DESMOPLASTIC COMPONENT, MICROSCOPIC SATELLITE (4.0 X 1.8 MM),  LYMPHOVASCULAR AND PERINEURAL INVASION, MARGINS FREE    05/23/2023 PET scan   PET 1. No findings of active  malignancy. 2. Low-grade activity along the margins of a 260 cc fluid collection along the superficial fascia margin of the right anterior abdominal wall, likely a postoperative fluid collection. Favor inflammatory activity over residual/recurrent malignancy. 3. Low-grade activity along the lateral margin of a 2.4 cm right inguinal fluid collection, compatible with lymph node dissection site, likely inflammatory activity rather than neoplastic. 4. Right axillary stranding compatible with recent operative site, with low-grade activity along its margins, likely postinflammatory/post surgical activity. 5.  Aortic Atherosclerosis (ICD10-I70.0). 6. 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 7. Nonspecific subcutaneous edema in the left calf and dorsal left foot.   06/15/2023 Cancer Staging   Staging form: Melanoma of the Skin, AJCC 8th Edition - Pathologic stage from 06/15/2023: Stage IIIC (pT4a, pN1, cM0) - Signed by Izell Domino, MD on 06/15/2023 Stage prefix: Initial diagnosis   06/27/2023 - 07/06/2023 Radiation Therapy   30 Gy/5 Fx radiation    09/13/2023 -  Chemotherapy   Patient is on Treatment Plan : MELANOMA ADJUVANT Nivolumab  (240) q14d      Genetic Testing   Negative CancerNext-Expanded +RNAinsight. The CancerNext-Expanded gene panel offered by [facility] and includes sequencing, rearrangement, and RNA analysis for the following 77 genes: AIP, ALK, APC, ATM, AXIN2, BAP1m BARD1, BMPR1Am BRCA1, BRCA2, BRIP1, CDC73, CDH1, CDK4, CDKN1B, CDKN2A, CEBPA, CHEK2, CTNNA1, DDX41, DICER1, EGFR, EPCAM, ETV6, FH, FLCN, GATA2, GREM1, HOXB13, KIT, LZTR1, MAX, MBD4, MEN1, MET, MITF, MLH1, MSH2, MSH3, MSH6, MUTYH, NF1, NF2, NTHL1, PALB2, PDGFRA, PHOX2B, PMS2, POLD1, POLE, POT1, PRKAR1A, PTCH1m PTEN, RAD51C, RAD51D, RB1, RET, RPS20, RUNX1, SDHA, SDHAF2, SDHB, SDHC, SDHD, SMAD4, SMARCA4, SMARCB1, SMARCE1,  STK11, SUFU, TMEM127, TP53, TSC1, TSC2, VHL, WT1. Report date 08/03/23.    12/13/2023 PET  scan   PET/CT did not show any concerning findings. Postvaccine axillary lymph node.       PHYSICAL EXAMINATION: ECOG PERFORMANCE STATUS: 2  Vitals:   03/20/24 0918  BP: 107/74  Pulse: 71  Resp: 16  Temp: 97.9 F (36.6 C)  SpO2: 98%   Filed Weights   03/20/24 0918  Weight: 188 lb (85.3 kg)    GENERAL: alert, no distress and comfortable SKIN: skin color normal and no jaundice  A few scattered rash over right arm EYES: sclera clear NECK: No palpable mass LYMPH:  no palpable cervical, axillary lymphadenopathy  LUNGS: clear to auscultation and no wheeze or rales with normal breathing effort HEART: regular rate & rhythm  ABDOMEN: abdomen soft, non-tender and nondistended.   Relevant data reviewed during this visit included labs.  New labs ordered.

## 2024-03-19 NOTE — Assessment & Plan Note (Addendum)
 Tolerating treatment well.  Will continue with treatment today Use triamcinolone  cream when needed for skin rash. Return with each cycle, lab and visit, then infusion We will repeat PET scan in February

## 2024-03-20 ENCOUNTER — Inpatient Hospital Stay

## 2024-03-20 ENCOUNTER — Inpatient Hospital Stay (HOSPITAL_BASED_OUTPATIENT_CLINIC_OR_DEPARTMENT_OTHER)

## 2024-03-20 VITALS — BP 107/74 | HR 71 | Temp 97.9°F | Resp 16 | Ht 69.0 in | Wt 188.0 lb

## 2024-03-20 DIAGNOSIS — C4359 Malignant melanoma of other part of trunk: Secondary | ICD-10-CM | POA: Insufficient documentation

## 2024-03-20 DIAGNOSIS — R21 Rash and other nonspecific skin eruption: Secondary | ICD-10-CM

## 2024-03-20 DIAGNOSIS — Z5112 Encounter for antineoplastic immunotherapy: Secondary | ICD-10-CM | POA: Insufficient documentation

## 2024-03-20 DIAGNOSIS — Z79620 Long term (current) use of immunosuppressive biologic: Secondary | ICD-10-CM | POA: Insufficient documentation

## 2024-03-20 LAB — CBC WITH DIFFERENTIAL (CANCER CENTER ONLY)
Abs Immature Granulocytes: 0.01 K/uL (ref 0.00–0.07)
Basophils Absolute: 0.1 K/uL (ref 0.0–0.1)
Basophils Relative: 1 %
Eosinophils Absolute: 0.2 K/uL (ref 0.0–0.5)
Eosinophils Relative: 4 %
HCT: 45.6 % (ref 39.0–52.0)
Hemoglobin: 16 g/dL (ref 13.0–17.0)
Immature Granulocytes: 0 %
Lymphocytes Relative: 25 %
Lymphs Abs: 1.4 K/uL (ref 0.7–4.0)
MCH: 30.4 pg (ref 26.0–34.0)
MCHC: 35.1 g/dL (ref 30.0–36.0)
MCV: 86.5 fL (ref 80.0–100.0)
Monocytes Absolute: 0.6 K/uL (ref 0.1–1.0)
Monocytes Relative: 10 %
Neutro Abs: 3.5 K/uL (ref 1.7–7.7)
Neutrophils Relative %: 60 %
Platelet Count: 218 K/uL (ref 150–400)
RBC: 5.27 MIL/uL (ref 4.22–5.81)
RDW: 13.1 % (ref 11.5–15.5)
WBC Count: 5.8 K/uL (ref 4.0–10.5)
nRBC: 0 % (ref 0.0–0.2)

## 2024-03-20 LAB — CMP (CANCER CENTER ONLY)
ALT: 35 U/L (ref 0–44)
AST: 29 U/L (ref 15–41)
Albumin: 4.6 g/dL (ref 3.5–5.0)
Alkaline Phosphatase: 64 U/L (ref 38–126)
Anion gap: 12 (ref 5–15)
BUN: 20 mg/dL (ref 8–23)
CO2: 24 mmol/L (ref 22–32)
Calcium: 9.7 mg/dL (ref 8.9–10.3)
Chloride: 100 mmol/L (ref 98–111)
Creatinine: 1.03 mg/dL (ref 0.61–1.24)
GFR, Estimated: >60 mL/min
Glucose, Bld: 120 mg/dL — ABNORMAL HIGH (ref 70–99)
Potassium: 3.7 mmol/L (ref 3.5–5.1)
Sodium: 136 mmol/L (ref 135–145)
Total Bilirubin: 0.7 mg/dL (ref 0.0–1.2)
Total Protein: 7.8 g/dL (ref 6.5–8.1)

## 2024-03-20 LAB — T4, FREE: Free T4: 1.25 ng/dL (ref 0.80–2.00)

## 2024-03-20 LAB — TSH: TSH: 1.76 u[IU]/mL (ref 0.350–4.500)

## 2024-03-20 MED ORDER — SODIUM CHLORIDE 0.9 % IV SOLN
240.0000 mg | Freq: Once | INTRAVENOUS | Status: AC
  Administered 2024-03-20: 240 mg via INTRAVENOUS
  Filled 2024-03-20: qty 24

## 2024-03-20 MED ORDER — TRIAMCINOLONE ACETONIDE 0.5 % EX OINT: 1.0000 | TOPICAL_OINTMENT | Freq: Two times a day (BID) | CUTANEOUS | 0 refills | Status: AC

## 2024-03-20 MED ORDER — SODIUM CHLORIDE 0.9 % IV SOLN: INTRAVENOUS | Status: DC

## 2024-03-20 NOTE — Patient Instructions (Signed)
 [facility] - A DEPT OF [facility]  Discharge Instructions: Thank you for choosing [facility] to provide your oncology and hematology care.   If you have a lab appointment with the Cancer Center, please go directly to the Cancer Center and check in at the registration area.   Wear comfortable clothing and clothing appropriate for easy access to any Portacath or PICC line.   We strive to give you quality time with your provider. You may need to reschedule your appointment if you arrive late (15 or more minutes).  Arriving late affects you and other patients whose appointments are after yours.  Also, if you miss three or more appointments without notifying the office, you may be dismissed from the clinic at the provider's discretion.      For prescription refill requests, have your pharmacy contact our office and allow 72 hours for refills to be completed.    Today you received the following chemotherapy and/or immunotherapy agents: Nivolumab.       To help prevent nausea and vomiting after your treatment, we encourage you to take your nausea medication as directed.  BELOW ARE SYMPTOMS THAT SHOULD BE REPORTED IMMEDIATELY: *FEVER GREATER THAN 100.4 F (38 C) OR HIGHER *CHILLS OR SWEATING *NAUSEA AND VOMITING THAT IS NOT CONTROLLED WITH YOUR NAUSEA MEDICATION *UNUSUAL SHORTNESS OF BREATH *UNUSUAL BRUISING OR BLEEDING *URINARY PROBLEMS (pain or burning when urinating, or frequent urination) *BOWEL PROBLEMS (unusual diarrhea, constipation, pain near the anus) TENDERNESS IN MOUTH AND THROAT WITH OR WITHOUT PRESENCE OF ULCERS (sore throat, sores in mouth, or a toothache) UNUSUAL RASH, SWELLING OR PAIN  UNUSUAL VAGINAL DISCHARGE OR ITCHING   Items with * indicate a potential emergency and should be followed up as soon as possible or go to the Emergency Department if any problems should occur.  Please show the CHEMOTHERAPY ALERT CARD or  IMMUNOTHERAPY ALERT CARD at check-in to the Emergency Department and triage nurse.  Should you have questions after your visit or need to cancel or reschedule your appointment, please contact [facility] - A DEPT OF [facility]  Dept: [phone]  and follow the prompts.  Office hours are 8:00 a.m. to 4:30 p.m. Monday - Friday. Please note that voicemails left after 4:00 p.m. may not be returned until the following business day.  We are closed weekends and major holidays. You have access to a nurse at all times for urgent questions. Please call the main number to the clinic Dept: [phone] and follow the prompts.   For any non-urgent questions, you may also contact your provider using MyChart. We now offer e-Visits for anyone 21 and older to request care online for non-urgent symptoms. For details visit [email or web address].   Also download the MyChart app! Go to the app store, search "MyChart", open the app, select [place], and log in with your MyChart username and password.

## 2024-03-20 NOTE — Addendum Note (Signed)
 Addended by: TINA HUDSON on: 03/20/2024 09:59 AM   Modules accepted: Orders

## 2024-03-27 ENCOUNTER — Other Ambulatory Visit: Payer: Self-pay

## 2024-04-01 ENCOUNTER — Other Ambulatory Visit: Payer: Self-pay

## 2024-04-02 NOTE — Assessment & Plan Note (Addendum)
 Tolerating treatment well.  Will continue with treatment today Use triamcinolone  cream when needed for skin rash. Return with each cycle, lab and visit, then infusion We will repeat PET scan in February [facility]. Dr. Elnor at [facility]

## 2024-04-02 NOTE — Progress Notes (Unsigned)
 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 Treatment: adjuvant nivolumab . 09/13/23  Clinically feeling well.  Grade 1 immune related dermatitis.  Responding to topical steroid treatment.  Will continue twice daily Kenalog  ointment until resolution.  Continue to monitor for skin rash and Hardin potential side effects. Assessment & Plan Melanoma of abdominal wall (HCC) Tolerating treatment well.  Will continue with treatment today Use triamcinolone  cream when needed for skin rash. Return with each cycle, lab and visit, then infusion We will repeat PET scan in February [facility]. Dr. Elnor at [facility] Skin rash Improved continue Kenalog  0.5% twice daily until resolved. Refilled today Advised patient to call and report if worsening    Anthony Anthony Chihuahua, MD  INTERVAL HISTORY: Patient returns for follow-up. No worsening rash. Report kenalog  ointment works better than OTC cortisone. Occasionally findings a pimple. No coughing, short of breath, chest pain, stomach pain, diarrhea.  He thinks some left side sensation is coming back. Baseline left side deficit from stroke.  Oncology History  Melanoma of abdominal wall (HCC)  03/2023 Initial Diagnosis   Melanoma of abdominal wall [facility])  He has a birth mark and noted larger last spring and then continued to change in size and color by late summer. He saw dermatology in Jan 2025. It went from smaller than a dime most of his life to about double the size when he saw dermatology.   04/18/2023 Pathology Results   A.    SKIN, RIGHT ABDOMINAL WALL, EXCISION: RESIDUAL MALIGNANT MELANOMA,  4.7 MM PT4 IN EXCISIONAL SPECIMEN, WITH SUPERFICIAL SPREADING AND  ADDITIONAL DESMOPLASTIC COMPONENT, MICROSCOPIC SATELLITE (4.0 X 1.8 MM),  LYMPHOVASCULAR AND PERINEURAL INVASION, MARGINS FREE    05/23/2023 PET scan   PET 1. No findings of active malignancy. 2. Low-grade activity along the margins of a 260 cc fluid collection along the superficial fascia margin of the right anterior abdominal wall, likely a postoperative fluid collection. Favor inflammatory activity over residual/recurrent malignancy. 3. Low-grade activity along the lateral margin of a 2.4 cm right inguinal fluid collection, compatible with lymph node dissection site, likely inflammatory activity rather than neoplastic. 4. Right axillary stranding compatible with recent operative site, with low-grade activity along its margins, likely postinflammatory/post surgical activity. 5.  Aortic Atherosclerosis (ICD10-I70.0). 6. 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 7. Nonspecific subcutaneous edema in the left calf and dorsal left foot.   06/15/2023 Cancer Staging   Staging form: Melanoma of the Skin, AJCC 8th Edition - Pathologic stage from 06/15/2023: Stage IIIC (pT4a, pN1, cM0) - Signed by Anthony Domino, MD on 06/15/2023 Stage prefix: Initial diagnosis   06/27/2023 - 07/06/2023 Radiation Therapy   30 Gy/5 Fx radiation    09/13/2023 -  Chemotherapy   Patient is on Treatment Plan : MELANOMA ADJUVANT Nivolumab  (240) q14d      Genetic Testing   Negative CancerNext-Expanded +RNAinsight. The CancerNext-Expanded gene panel offered by [facility] and includes sequencing, rearrangement, and RNA analysis for the following 77 genes: AIP, ALK, APC, ATM, AXIN2, BAP1m BARD1, BMPR1Am BRCA1, BRCA2, BRIP1, CDC73, CDH1, CDK4, CDKN1B, CDKN2A, CEBPA, CHEK2, CTNNA1, DDX41, DICER1, EGFR, EPCAM, ETV6, FH,  FLCN, GATA2, GREM1, HOXB13, KIT, LZTR1, MAX, MBD4, MEN1, MET,  MITF, MLH1, MSH2, MSH3, MSH6, MUTYH, NF1, NF2, NTHL1, PALB2, PDGFRA, PHOX2B, PMS2, POLD1, POLE, POT1, PRKAR1A, PTCH1m PTEN, RAD51C, RAD51D, RB1, RET, RPS20, RUNX1, SDHA, SDHAF2, SDHB, SDHC, SDHD, SMAD4, SMARCA4, SMARCB1, SMARCE1, STK11, SUFU, TMEM127, TP53, TSC1, TSC2, VHL, WT1. Report date 08/03/23.    12/13/2023 PET scan   PET/CT did not show any concerning findings. Postvaccine axillary lymph node.       PHYSICAL EXAMINATION: ECOG PERFORMANCE STATUS: 2  Vitals:   04/03/24 1000  BP: 110/70  Pulse: 66  Resp: 20  Temp: (!) 97.5 F (36.4 C)  SpO2: 95%   Filed Weights   04/03/24 1000  Weight: 191 lb 4 oz (86.8 kg)    GENERAL: alert, no distress and comfortable SKIN: skin color normal and no jaundice 1 scaly rash over the right shin EYES: normal, sclera clear LUNGS: clear to auscultation and no wheeze or rales with normal breathing effort HEART: regular rate & rhythm  ABDOMEN: abdomen soft, non-tender and nondistended. Musculoskeletal: no edema   Relevant data reviewed during this visit included labs.

## 2024-04-02 NOTE — Assessment & Plan Note (Addendum)
 Improved continue Kenalog  0.5% twice daily until resolved. Refilled today Advised patient to call and report if worsening

## 2024-04-03 ENCOUNTER — Inpatient Hospital Stay

## 2024-04-03 VITALS — BP 110/70 | HR 66 | Temp 97.5°F | Resp 20 | Wt 191.2 lb

## 2024-04-03 DIAGNOSIS — C4359 Malignant melanoma of other part of trunk: Secondary | ICD-10-CM

## 2024-04-03 DIAGNOSIS — R21 Rash and other nonspecific skin eruption: Secondary | ICD-10-CM

## 2024-04-03 LAB — CMP (CANCER CENTER ONLY)
ALT: 34 U/L (ref 0–44)
AST: 27 U/L (ref 15–41)
Albumin: 4.6 g/dL (ref 3.5–5.0)
Alkaline Phosphatase: 60 U/L (ref 38–126)
Anion gap: 13 (ref 5–15)
BUN: 18 mg/dL (ref 8–23)
CO2: 23 mmol/L (ref 22–32)
Calcium: 9.9 mg/dL (ref 8.9–10.3)
Chloride: 101 mmol/L (ref 98–111)
Creatinine: 0.96 mg/dL (ref 0.61–1.24)
GFR, Estimated: >60 mL/min
Glucose, Bld: 122 mg/dL — ABNORMAL HIGH (ref 70–99)
Potassium: 4 mmol/L (ref 3.5–5.1)
Sodium: 137 mmol/L (ref 135–145)
Total Bilirubin: 0.7 mg/dL (ref 0.0–1.2)
Total Protein: 7.7 g/dL (ref 6.5–8.1)

## 2024-04-03 LAB — CBC WITH DIFFERENTIAL (CANCER CENTER ONLY)
Abs Immature Granulocytes: 0.02 K/uL (ref 0.00–0.07)
Basophils Absolute: 0.1 K/uL (ref 0.0–0.1)
Basophils Relative: 1 %
Eosinophils Absolute: 0.2 K/uL (ref 0.0–0.5)
Eosinophils Relative: 3 %
HCT: 46.4 % (ref 39.0–52.0)
Hemoglobin: 16.3 g/dL (ref 13.0–17.0)
Immature Granulocytes: 0 %
Lymphocytes Relative: 24 %
Lymphs Abs: 1.6 K/uL (ref 0.7–4.0)
MCH: 30.5 pg (ref 26.0–34.0)
MCHC: 35.1 g/dL (ref 30.0–36.0)
MCV: 86.7 fL (ref 80.0–100.0)
Monocytes Absolute: 0.6 K/uL (ref 0.1–1.0)
Monocytes Relative: 9 %
Neutro Abs: 4.2 K/uL (ref 1.7–7.7)
Neutrophils Relative %: 63 %
Platelet Count: 215 K/uL (ref 150–400)
RBC: 5.35 MIL/uL (ref 4.22–5.81)
RDW: 12.9 % (ref 11.5–15.5)
WBC Count: 6.7 K/uL (ref 4.0–10.5)
nRBC: 0 % (ref 0.0–0.2)

## 2024-04-03 LAB — TSH: TSH: 1.72 u[IU]/mL (ref 0.350–4.500)

## 2024-04-03 LAB — T4, FREE: Free T4: 1.25 ng/dL (ref 0.80–2.00)

## 2024-04-03 LAB — HEMOGLOBIN A1C
Hgb A1c MFr Bld: 6 % — ABNORMAL HIGH (ref 4.8–5.6)
Mean Plasma Glucose: 125.5 mg/dL

## 2024-04-03 MED ORDER — SODIUM CHLORIDE 0.9 % IV SOLN
240.0000 mg | Freq: Once | INTRAVENOUS | Status: AC
  Administered 2024-04-03: 240 mg via INTRAVENOUS
  Filled 2024-04-03: qty 24

## 2024-04-03 MED ORDER — SODIUM CHLORIDE 0.9 % IV SOLN: INTRAVENOUS | Status: DC

## 2024-04-03 NOTE — Patient Instructions (Signed)
 [facility] - A DEPT OF [facility]  Discharge Instructions: Thank you for choosing [facility] to provide your oncology and hematology care.   If you have a lab appointment with the Cancer Center, please go directly to the Cancer Center and check in at the registration area.   Wear comfortable clothing and clothing appropriate for easy access to any Portacath or PICC line.   We strive to give you quality time with your provider. You may need to reschedule your appointment if you arrive late (15 or more minutes).  Arriving late affects you and other patients whose appointments are after yours.  Also, if you miss three or more appointments without notifying the office, you may be dismissed from the clinic at the provider's discretion.      For prescription refill requests, have your pharmacy contact our office and allow 72 hours for refills to be completed.    Today you received the following chemotherapy and/or immunotherapy agents: Nivolumab.       To help prevent nausea and vomiting after your treatment, we encourage you to take your nausea medication as directed.  BELOW ARE SYMPTOMS THAT SHOULD BE REPORTED IMMEDIATELY: *FEVER GREATER THAN 100.4 F (38 C) OR HIGHER *CHILLS OR SWEATING *NAUSEA AND VOMITING THAT IS NOT CONTROLLED WITH YOUR NAUSEA MEDICATION *UNUSUAL SHORTNESS OF BREATH *UNUSUAL BRUISING OR BLEEDING *URINARY PROBLEMS (pain or burning when urinating, or frequent urination) *BOWEL PROBLEMS (unusual diarrhea, constipation, pain near the anus) TENDERNESS IN MOUTH AND THROAT WITH OR WITHOUT PRESENCE OF ULCERS (sore throat, sores in mouth, or a toothache) UNUSUAL RASH, SWELLING OR PAIN  UNUSUAL VAGINAL DISCHARGE OR ITCHING   Items with * indicate a potential emergency and should be followed up as soon as possible or go to the Emergency Department if any problems should occur.  Please show the CHEMOTHERAPY ALERT CARD or  IMMUNOTHERAPY ALERT CARD at check-in to the Emergency Department and triage nurse.  Should you have questions after your visit or need to cancel or reschedule your appointment, please contact [facility] - A DEPT OF [facility]  Dept: [phone]  and follow the prompts.  Office hours are 8:00 a.m. to 4:30 p.m. Monday - Friday. Please note that voicemails left after 4:00 p.m. may not be returned until the following business day.  We are closed weekends and major holidays. You have access to a nurse at all times for urgent questions. Please call the main number to the clinic Dept: [phone] and follow the prompts.   For any non-urgent questions, you may also contact your provider using MyChart. We now offer e-Visits for anyone 21 and older to request care online for non-urgent symptoms. For details visit [email or web address].   Also download the MyChart app! Go to the app store, search "MyChart", open the app, select [place], and log in with your MyChart username and password.

## 2024-04-15 NOTE — Assessment & Plan Note (Addendum)
 Tolerating treatment well.  Will continue with treatment today Use triamcinolone  cream when needed for skin rash. Return with each cycle, lab and visit, then infusion We will repeat PET scan in February [facility]. Dr. Elnor at [facility]

## 2024-04-15 NOTE — Assessment & Plan Note (Addendum)
 Improved continue Kenalog  0.5% twice daily until resolved. Refilled today Advised patient to call and report if worsening

## 2024-04-17 ENCOUNTER — Inpatient Hospital Stay

## 2024-04-17 VITALS — BP 103/69 | HR 71 | Temp 97.7°F | Resp 18 | Wt 189.0 lb

## 2024-04-17 VITALS — BP 122/75 | HR 68 | Resp 16

## 2024-04-17 DIAGNOSIS — C4359 Malignant melanoma of other part of trunk: Secondary | ICD-10-CM

## 2024-04-17 DIAGNOSIS — R21 Rash and other nonspecific skin eruption: Secondary | ICD-10-CM

## 2024-04-17 LAB — CBC WITH DIFFERENTIAL (CANCER CENTER ONLY)
Abs Immature Granulocytes: 0.02 10*3/uL (ref 0.00–0.07)
Basophils Absolute: 0.1 10*3/uL (ref 0.0–0.1)
Basophils Relative: 1 %
Eosinophils Absolute: 0.2 10*3/uL (ref 0.0–0.5)
Eosinophils Relative: 3 %
HCT: 45 % (ref 39.0–52.0)
Hemoglobin: 15.8 g/dL (ref 13.0–17.0)
Immature Granulocytes: 0 %
Lymphocytes Relative: 20 %
Lymphs Abs: 1.5 10*3/uL (ref 0.7–4.0)
MCH: 30.4 pg (ref 26.0–34.0)
MCHC: 35.1 g/dL (ref 30.0–36.0)
MCV: 86.7 fL (ref 80.0–100.0)
Monocytes Absolute: 0.7 10*3/uL (ref 0.1–1.0)
Monocytes Relative: 9 %
Neutro Abs: 5 10*3/uL (ref 1.7–7.7)
Neutrophils Relative %: 67 %
Platelet Count: 214 10*3/uL (ref 150–400)
RBC: 5.19 MIL/uL (ref 4.22–5.81)
RDW: 13 % (ref 11.5–15.5)
WBC Count: 7.5 10*3/uL (ref 4.0–10.5)
nRBC: 0 % (ref 0.0–0.2)

## 2024-04-17 LAB — CMP (CANCER CENTER ONLY)
ALT: 40 U/L (ref 0–44)
AST: 28 U/L (ref 15–41)
Albumin: 4.5 g/dL (ref 3.5–5.0)
Alkaline Phosphatase: 66 U/L (ref 38–126)
Anion gap: 12 (ref 5–15)
BUN: 22 mg/dL (ref 8–23)
CO2: 25 mmol/L (ref 22–32)
Calcium: 9.9 mg/dL (ref 8.9–10.3)
Chloride: 101 mmol/L (ref 98–111)
Creatinine: 0.95 mg/dL (ref 0.61–1.24)
GFR, Estimated: >60 mL/min
Glucose, Bld: 122 mg/dL — ABNORMAL HIGH (ref 70–99)
Potassium: 3.8 mmol/L (ref 3.5–5.1)
Sodium: 137 mmol/L (ref 135–145)
Total Bilirubin: 0.6 mg/dL (ref 0.0–1.2)
Total Protein: 7.7 g/dL (ref 6.5–8.1)

## 2024-04-17 LAB — TSH: TSH: 1.56 u[IU]/mL (ref 0.350–4.500)

## 2024-04-17 LAB — T4, FREE: Free T4: 1.23 ng/dL (ref 0.80–2.00)

## 2024-04-17 MED ORDER — SODIUM CHLORIDE 0.9 % IV SOLN
240.0000 mg | Freq: Once | INTRAVENOUS | Status: AC
  Administered 2024-04-17: 240 mg via INTRAVENOUS
  Filled 2024-04-17: qty 24

## 2024-04-17 MED ORDER — SODIUM CHLORIDE 0.9 % IV SOLN: INTRAVENOUS | Status: DC

## 2024-04-17 NOTE — Patient Instructions (Signed)
 [facility] - A DEPT OF [facility]  Discharge Instructions: Thank you for choosing [facility] to provide your oncology and hematology care.   If you have a lab appointment with the Cancer Center, please go directly to the Cancer Center and check in at the registration area.   Wear comfortable clothing and clothing appropriate for easy access to any Portacath or PICC line.   We strive to give you quality time with your provider. You may need to reschedule your appointment if you arrive late (15 or more minutes).  Arriving late affects you and other patients whose appointments are after yours.  Also, if you miss three or more appointments without notifying the office, you may be dismissed from the clinic at the provider's discretion.      For prescription refill requests, have your pharmacy contact our office and allow 72 hours for refills to be completed.    Today you received the following chemotherapy and/or immunotherapy agents: Opdivo    To help prevent nausea and vomiting after your treatment, we encourage you to take your nausea medication as directed.  BELOW ARE SYMPTOMS THAT SHOULD BE REPORTED IMMEDIATELY: *FEVER GREATER THAN 100.4 F (38 C) OR HIGHER *CHILLS OR SWEATING *NAUSEA AND VOMITING THAT IS NOT CONTROLLED WITH YOUR NAUSEA MEDICATION *UNUSUAL SHORTNESS OF BREATH *UNUSUAL BRUISING OR BLEEDING *URINARY PROBLEMS (pain or burning when urinating, or frequent urination) *BOWEL PROBLEMS (unusual diarrhea, constipation, pain near the anus) TENDERNESS IN MOUTH AND THROAT WITH OR WITHOUT PRESENCE OF ULCERS (sore throat, sores in mouth, or a toothache) UNUSUAL RASH, SWELLING OR PAIN  UNUSUAL VAGINAL DISCHARGE OR ITCHING   Items with * indicate a potential emergency and should be followed up as soon as possible or go to the Emergency Department if any problems should occur.  Please show the CHEMOTHERAPY ALERT CARD or IMMUNOTHERAPY ALERT  CARD at check-in to the Emergency Department and triage nurse.  Should you have questions after your visit or need to cancel or reschedule your appointment, please contact [facility] - A DEPT OF [facility]  Dept: [phone]  and follow the prompts.  Office hours are 8:00 a.m. to 4:30 p.m. Monday - Friday. Please note that voicemails left after 4:00 p.m. may not be returned until the following business day.  We are closed weekends and major holidays. You have access to a nurse at all times for urgent questions. Please call the main number to the clinic Dept: [phone] and follow the prompts.   For any non-urgent questions, you may also contact your provider using MyChart. We now offer e-Visits for anyone 24 and older to request care online for non-urgent symptoms. For details visit [email or web address].   Also download the MyChart app! Go to the app store, search MyChart, open the app, select [place], and log in with your MyChart username and password.

## 2024-05-01 ENCOUNTER — Inpatient Hospital Stay

## 2024-05-15 ENCOUNTER — Inpatient Hospital Stay

## 2024-05-29 ENCOUNTER — Inpatient Hospital Stay
